# Patient Record
Sex: Male | Born: 1940 | Race: Black or African American | Hispanic: No | Marital: Married | Smoking: Former smoker
Health system: Southern US, Community
[De-identification: ages and names within clinical notes are randomized; demographics above are authoritative.]

## PROBLEM LIST (undated history)

## (undated) DIAGNOSIS — E119 Type 2 diabetes mellitus without complications: Secondary | ICD-10-CM

## (undated) DIAGNOSIS — C20 Malignant neoplasm of rectum: Secondary | ICD-10-CM

## (undated) DIAGNOSIS — I639 Cerebral infarction, unspecified: Secondary | ICD-10-CM

## (undated) DIAGNOSIS — I499 Cardiac arrhythmia, unspecified: Secondary | ICD-10-CM

## (undated) DIAGNOSIS — N189 Chronic kidney disease, unspecified: Secondary | ICD-10-CM

## (undated) DIAGNOSIS — C801 Malignant (primary) neoplasm, unspecified: Secondary | ICD-10-CM

## (undated) DIAGNOSIS — I10 Essential (primary) hypertension: Secondary | ICD-10-CM

## (undated) DIAGNOSIS — H409 Unspecified glaucoma: Secondary | ICD-10-CM

## (undated) DIAGNOSIS — Z978 Presence of other specified devices: Secondary | ICD-10-CM

## (undated) HISTORY — DX: Unspecified glaucoma: H40.9

## (undated) HISTORY — DX: Chronic kidney disease, unspecified: N18.9

## (undated) HISTORY — PX: COLON SURGERY: SHX602

---

## 2001-06-13 ENCOUNTER — Emergency Department (HOSPITAL_COMMUNITY): Admission: EM | Admit: 2001-06-13 | Discharge: 2001-06-13 | Payer: Self-pay | Admitting: Emergency Medicine

## 2004-06-22 ENCOUNTER — Inpatient Hospital Stay (HOSPITAL_COMMUNITY): Admission: EM | Admit: 2004-06-22 | Discharge: 2004-06-24 | Payer: Self-pay

## 2004-06-22 ENCOUNTER — Encounter (INDEPENDENT_AMBULATORY_CARE_PROVIDER_SITE_OTHER): Payer: Self-pay | Admitting: *Deleted

## 2004-06-23 ENCOUNTER — Ambulatory Visit: Payer: Self-pay | Admitting: Physical Medicine & Rehabilitation

## 2008-01-22 ENCOUNTER — Inpatient Hospital Stay (HOSPITAL_COMMUNITY): Admission: EM | Admit: 2008-01-22 | Discharge: 2008-01-24 | Payer: Self-pay | Admitting: Emergency Medicine

## 2010-10-24 NOTE — Consult Note (Signed)
NAMEDARRALL, MATA NO.:  [id number]   MEDICAL RECORD NO.:  [id number]          PATIENT TYPE:  INP   LOCATION:  3035                         FACILITY:  [facility]   PHYSICIAN:  Joyice Faster. Cornett, M.D.DATE OF BIRTH:  06-05-1941   DATE OF CONSULTATION:  DATE OF DISCHARGE:                                 CONSULTATION   PHYSICIAN REQUESTING CONSULTATION:  Dr. Ripley Fraise, MD.   REASON FOR CONSULTATION:  Abdominal pain, nausea, and vomiting.   HISTORY OF PRESENT ILLNESS:  The patient is a 70 year old male with past  medical history of colon cancer status post colon resection with  apparent colostomy and end-colostomy closure done all at the [place] [facility] in  [place].  He presents with 1-day history of increasing abdominal  distension, nausea, vomiting, and diffuse abdominal pain.  The pain is  10/10.  It is made worse by palpation.  He has had 2 episodes of  vomiting today already.  The pain has been progressing and is located  throughout his abdomen and apparently it is constant.  He was seen in  the emergency room and acute abdominal series revealed potential partial  small bowel obstruction.  He has had no blood in his emesis.  His last  bowel movement was earlier today and had 2 bowel movements today which  are normal.  I was asked to see the patient at the request of Dr.  Christy Gentles in consultation.   PAST MEDICAL HISTORY:  1. Type 2 diabetes mellitus.  2. History of cerebrovascular accident.  3. Hypertension.  4. High cholesterol.  5. Colon cancer.   PAST SURGICAL HISTORY:  Please see above.  Colon cancer.   MEDICATIONS:  Glipizide and Lantus.   ALLERGIES:  None.   SOCIAL HISTORY:  He denies tobacco or alcohol use.   FAMILY HISTORY:  Noncontributory.   REVIEW OF SYSTEMS:  As above, otherwise negative x15.   PHYSICAL EXAM:  VITAL SIGNS:  Temperature 97, heart rate 71, and blood  pressure 170/85.  GENERAL APPEARANCE:  A pleasant male, in no apparent distress.  HEENT:  The patient has poor dentition.  Mucous membranes are moist.  Oropharynx clear.  No jaundice.  NECK:  Supple, nontender, and full range of motion.  CHEST:  Lungs sounds were fair bilaterally.  Chest wall motion normal.  CARDIOVASCULAR:  Regular rate and rhythm without rub, murmur, or gallop.  EXTREMITIES:  Warm and well perfused.  Muscle strength appeared normal.  Range of motion normal.  ABDOMEN:  Distended and tender.  Multiple scars from previous surgery  noted.  No obvious rebound though.  No hernia.  Decreased bowel sounds.  NEURO:  Glasgow Coma Scale 15.  Motor and sensory function appeared  grossly intact.  His speech is clear.   DIAGNOSTIC STUDIES:  Acute abdominal series reveals potential partial  small bowel obstruction.  CT scan of the abdomen and pelvis with just  oral contrast reveals small bowel dilatation with some decompressed  distal small bowel, worrisome for partial small bowel obstruction.  There was a large amount of stool in the colon.  No  obvious significant  free fluid.  The patient has a white count of 11,800 with left shift.  Sodium 40, creatinine is 1.6, BUN 22, chloride 107, and potassium 4.9.   IMPRESSION:  1. Partial small bowel obstruction with history of colon cancer status      post multiple bowel procedures.  2. Type 2 diabetes mellitus.  3. Hypertension.  4. Hypercholesterolemia.  5. History of cerebrovascular accident.   PLAN:  We will admit for IV fluid and NG-tube decompression n.p.o.  We  will repeat his films in the morning with him on bowel rest.      Marcello Moores A. Cornett, M.D.  Electronically Signed     TAC/MEDQ  D:  01/22/2008  T:  01/23/2008  Job:  [id number]

## 2010-10-27 NOTE — Discharge Summary (Signed)
[name], [name] NO.:  [id number]   MEDICAL RECORD NO.:  [id number]          PATIENT TYPE:  INP   LOCATION:  3023                         FACILITY:  [facility]   PHYSICIAN:  Princess Bruins. Hickling, M.D.DATE OF BIRTH:  January 20, 1941   DATE OF ADMISSION:  06/22/2004  DATE OF DISCHARGE:  06/24/2004                                 DISCHARGE SUMMARY   FINAL DIAGNOSES:  1.  Left brain lacunar infarction, thrombotic, non-hemorrhagic type 434.01.  2.  Hypertension, in poor control 404.10 without congestive heart failure or      kidney disease.  3.  Diabetes, type 2, non-insulin dependent, in poor control.  4.  Dyslipidemia.   IMAGING PROCEDURES:  Magnetic resonance imaging of brain, magnetic resonance  angiography, intracranial carotid Doppler.   COMPLICATIONS:  None.   HOSPITAL COURSE:  The patient is a 71 year old gentleman who was admitted  with the sudden onset of right hemiparesis that occurred as he awakened in  the middle of the night.   The patient has improved in the hospital.  He has multiple risk factors for  stroke, including diabetes and hypertension that are out of control and  dyslipidemia.  The patient has been on medicines in the past.  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   On evaluation in the hospital, MRI shows an acute left basal ganglion non-  hemorrhagic deep white matter infarction.  Carotid Doppler shows mild  intimal wall thickening in the common carotid artery without evidence of  significant plaque or hemodynamically significant stenosis.  Vertebral  artery flow is antegrade.  A 2-D echocardiogram shows an ejection fraction  of 55% to 65%, no left ventricular regional wall abnormalities.  The left  ventricular wall thickness, systolic function and size were all normal.  Aortic valvular thickness was mildly increased.  No clots were seen.   Laboratory studies showed a hemoglobin A1c of 14, serum  homocysteine 13.09,  which is upper limits of normal, total cholesterol 231, HDL cholesterol 30,  LDL cholesterol 157, VLDL cholesterol 44, these are all abnormal.  Other  laboratory studies show an MRA that shows no significant proximal stenosis  and widely patent basilar arteries.  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  On the left, the patient has mild  atherosclerotic changes in the left cavernous and supraclinoid regions.  The  left middle cerebral artery and anterior cerebral artery are widely patent  and there is no significant posterior cerebral artery disease.   Other laboratory studies show a white count of 7900, hemoglobin 14.8,  hematocrit 42.8, MCV 84.5, platelet count 274,000, 50% polys, 33% lymphs,  11% monos, 5% eosinophils, 1% basophils.  Prothrombin time 12.5, PTT 26.  Sodium 139, potassium 3.8, chloride 103, CO2 26, glucose 325, BUN 9.0,  creatinine 1.2.  Calcium 9.2, total protein 6.9, albumin 3.6.  AST 14, ALT  15,  ALP 100, total bilirubin 0.6.  Urinalysis:  Specific gravity 1.028, pH  6.5, glucose greater than 1000 mg/dL, negative hemoglobin, bilirubin,  ketones 15 mg/dL, negative protein, negative nitrite, moderate leukocyte  esterase, 3-6 white blood cells, no red blood cells, few yeast.  Drug screen  for drugs of abuse was negative.   CONDITION ON DISCHARGE:  The patient is discharged in improved condition.  He has mild right-sided clumsiness but no true weakness.  His gait is  slightly broad-based today.  His major concern is his ability to afford the  medications, which was the problem in the past.  We are asking patient and  family services to provide some medications through the [facility] [facility],  and I have asked him to contact the [facility]  for  further medications.   DISCHARGE MEDICATIONS:  1.  Glucotrol 5 mg once daily.  This may need to be increased to 10 mg.  2.  Altace 5 mg daily.  3.  Zocor 40 mg once daily.  4.  Aspirin 325 mg daily.   DIET:  Low carbohydrate modified low salt diet.   FOLLOWUP:  He should return to see Dr. Leonie Man in 2-3 months at [phone].  He  needs ongoing care through diabetic care and we have asked advanced home  care to come to his home to assist him with proper diabetic monitoring.  Again, this is going to be difficult because the patient lacks insurance.      Will   WHH/MEDQ  D:  06/24/2004  T:  06/24/2004  Job:  [id number]

## 2011-03-09 LAB — GLUCOSE, CAPILLARY
Glucose-Capillary: 131 — ABNORMAL HIGH
Glucose-Capillary: 177 — ABNORMAL HIGH
Glucose-Capillary: 197 — ABNORMAL HIGH
Glucose-Capillary: 210 — ABNORMAL HIGH

## 2011-03-09 LAB — COMPREHENSIVE METABOLIC PANEL
ALT: 13
AST: 22
Albumin: 3.5
Alkaline Phosphatase: 64
CO2: 25
Chloride: 101
Creatinine, Ser: 1.4
GFR calc Af Amer: >60
GFR calc non Af Amer: 51 — ABNORMAL LOW
Potassium: 4.4
Sodium: 137
Total Bilirubin: 0.9

## 2011-03-09 LAB — URINALYSIS, ROUTINE W REFLEX MICROSCOPIC
Bilirubin Urine: NEGATIVE
Glucose, UA: NEGATIVE
Ketones, ur: NEGATIVE
Protein, ur: 100 — AB

## 2011-03-09 LAB — CBC
HCT: 41.8
Hemoglobin: 13.2
Hemoglobin: 13.7
MCHC: 32.8
MCHC: 33.4
MCV: 87.6
MCV: 87.8
Platelets: 200
RBC: 4.49
RBC: 4.59
RDW: 14.6
WBC: 12.4 — ABNORMAL HIGH

## 2011-03-09 LAB — DIFFERENTIAL
Basophils Absolute: 0
Basophils Relative: 0
Eosinophils Relative: 0
Monocytes Absolute: 0.5
Monocytes Relative: 4

## 2011-03-09 LAB — POCT I-STAT, CHEM 8
BUN: 22
Calcium, Ion: 1.11 — ABNORMAL LOW
Glucose, Bld: 133 — ABNORMAL HIGH
HCT: 43
TCO2: 26

## 2011-03-09 LAB — BASIC METABOLIC PANEL
CO2: 27
Chloride: 108
Creatinine, Ser: 1.36
GFR calc Af Amer: >60
Glucose, Bld: 129 — ABNORMAL HIGH
Sodium: 143

## 2011-03-09 LAB — URINE MICROSCOPIC-ADD ON

## 2020-01-18 ENCOUNTER — Encounter (HOSPITAL_COMMUNITY): Payer: Self-pay

## 2020-01-18 ENCOUNTER — Emergency Department (HOSPITAL_COMMUNITY)
Admission: EM | Admit: 2020-01-18 | Discharge: 2020-01-19 | Disposition: A | Discharge status: Home / Self Care | Payer: Medicare Other | Attending: Emergency Medicine | Admitting: Emergency Medicine

## 2020-01-18 ENCOUNTER — Other Ambulatory Visit: Payer: Self-pay

## 2020-01-18 DIAGNOSIS — R2243 Localized swelling, mass and lump, lower limb, bilateral: Secondary | ICD-10-CM | POA: Diagnosis not present

## 2020-01-18 DIAGNOSIS — Z5321 Procedure and treatment not carried out due to patient leaving prior to being seen by health care provider: Secondary | ICD-10-CM | POA: Diagnosis not present

## 2020-01-18 HISTORY — DX: Type 2 diabetes mellitus without complications: E11.9

## 2020-01-18 HISTORY — DX: Malignant (primary) neoplasm, unspecified: C80.1

## 2020-01-18 HISTORY — DX: Cerebral infarction, unspecified: I63.9

## 2020-01-18 HISTORY — DX: Essential (primary) hypertension: I10

## 2020-01-18 NOTE — ED Triage Notes (Signed)
Patient c/o bilateral lower leg swelling x 2 weeks.

## 2020-01-19 LAB — CBG MONITORING, ED: Glucose-Capillary: 194 mg/dL — ABNORMAL HIGH (ref 70–99)

## 2020-01-20 ENCOUNTER — Other Ambulatory Visit: Payer: Self-pay

## 2020-01-20 ENCOUNTER — Ambulatory Visit (HOSPITAL_COMMUNITY)
Admission: EM | Admit: 2020-01-20 | Discharge: 2020-01-20 | Disposition: A | Discharge status: Home / Self Care | Payer: Medicare Other | Attending: Family Medicine | Admitting: Family Medicine

## 2020-01-20 ENCOUNTER — Encounter (HOSPITAL_COMMUNITY): Payer: Self-pay | Admitting: Emergency Medicine

## 2020-01-20 DIAGNOSIS — R531 Weakness: Secondary | ICD-10-CM | POA: Diagnosis not present

## 2020-01-20 LAB — COMPREHENSIVE METABOLIC PANEL
ALT: 10 U/L (ref 0–44)
AST: 10 U/L — ABNORMAL LOW (ref 15–41)
Albumin: 4.2 g/dL (ref 3.5–5.0)
Alkaline Phosphatase: 41 U/L (ref 38–126)
Anion gap: 22 — ABNORMAL HIGH (ref 5–15)
BUN: 154 mg/dL — ABNORMAL HIGH (ref 8–23)
CO2: 16 mmol/L — ABNORMAL LOW (ref 22–32)
Calcium: 9.6 mg/dL (ref 8.9–10.3)
Chloride: 107 mmol/L (ref 98–111)
Creatinine, Ser: 18.89 mg/dL — ABNORMAL HIGH (ref 0.61–1.24)
GFR calc Af Amer: 2 mL/min — ABNORMAL LOW (ref >60)
GFR calc non Af Amer: 2 mL/min — ABNORMAL LOW (ref >60)
Glucose, Bld: 134 mg/dL — ABNORMAL HIGH (ref 70–99)
Potassium: 5.6 mmol/L — ABNORMAL HIGH (ref 3.5–5.1)
Sodium: 145 mmol/L (ref 135–145)
Total Bilirubin: 1.2 mg/dL (ref 0.3–1.2)
Total Protein: 7.8 g/dL (ref 6.5–8.1)

## 2020-01-20 MED ORDER — MIRTAZAPINE 7.5 MG PO TABS: 7.5000 mg | ORAL_TABLET | Freq: Every day | ORAL | 1 refills | Status: DC

## 2020-01-20 NOTE — Discharge Instructions (Signed)
Follow-up with [facility]  Call for appointment sooner than scheduled appointment end of this month

## 2020-01-20 NOTE — ED Triage Notes (Addendum)
Patient presents to [facility] for assessment of 3-4 weeks of bilateral leg weakness, tightness, and decreased ability to ambulate.    Patient states he takes insulins and BP meds, but is unsure of what he takes or what doses.  No hx on file.

## 2020-01-20 NOTE — ED Provider Notes (Signed)
Paul Bond    CSN: [id number] Arrival date & time: 01/20/20  1813      History   Chief Complaint Chief Complaint  Patient presents with  . Weakness    HPI SAVIOR HIMEBAUGH is a 79 y.o. male.  Patient presents with weakness in his legs.  He is normally followed at the [facility] for diabetes and hypertension.  He tells me the weakness occurs after he has been taken several steps and his legs seem to just be weak.  He denies any shortness of breath or chest pain.  He does take insulin for his diabetes which has been fairly well controlled by his history.  He is not a very good historian.  He denies any edema in the lower extremities.  Also complains of weight loss and anorexia.  Denies any change with bowel or bladder habits or abdominal pains. HPI  Past Medical History:  Diagnosis Date  . Cancer ([place])   . Diabetes mellitus without complication ([place])   . Hypertension   . Stroke [facility])     There are no problems to display for this patient.   Past Surgical History:  Procedure Laterality Date  . COLON SURGERY         Home Medications    Prior to Admission medications   Not on File    Family History History reviewed. No pertinent family history.  Social History Social History   Tobacco Use  . Smoking status: Former Research scientist (life sciences)  . Smokeless tobacco: Never Used  Vaping Use  . Vaping Use: Never used  Substance Use Topics  . Alcohol use: Never  . Drug use: Never     Allergies   Patient has no known allergies.   Review of Systems Review of Systems  Constitutional: Positive for fatigue and unexpected weight change.  Respiratory: Negative for shortness of breath.   Cardiovascular: Negative for chest pain.  Neurological: Positive for weakness.     Physical Exam Triage Vital Signs ED Triage Vitals [01/20/20 1846]  Enc Vitals Group     BP (!) 158/69     Pulse Rate 61     Resp 16     Temp 98.4 F (36.9 C)     Temp Source Oral     SpO2 98 %      Weight      Height      Head Circumference      Peak Flow      Pain Score 0     Pain Loc      Pain Edu?      Excl. in [place]?    No data found.  Updated Vital Signs BP (!) 158/69 (BP Location: Right Arm)   Pulse 61   Temp 98.4 F (36.9 C) (Oral)   Resp 16   SpO2 98%   Visual Acuity Right Eye Distance:   Left Eye Distance:   Bilateral Distance:    Right Eye Near:   Left Eye Near:    Bilateral Near:     Physical Exam Vitals and nursing note reviewed.  HENT:     Head: Normocephalic.     Mouth/Throat:     Mouth: Mucous membranes are moist.  Eyes:     Pupils: Pupils are equal, round, and reactive to light.  Cardiovascular:     Rate and Rhythm: Normal rate and regular rhythm.     Comments: Cannot feel pulses in his feet but feet are warm.  There is trace edema.  Pulmonary:     Effort: Pulmonary effort is normal.     Breath sounds: Normal breath sounds.  Abdominal:     General: Abdomen is flat.     Palpations: Abdomen is soft.  Neurological:     General: No focal deficit present.     Mental Status: He is alert and oriented to person, place, and time.      UC Treatments / Results  Labs (all labs ordered are listed, but only abnormal results are displayed) Labs Reviewed - No data to display  EKG   Radiology No results found.  Procedures Procedures (including critical care time)  Medications Ordered in UC Medications - No data to display  Initial Impression / Assessment and Plan / UC Course  I have reviewed the triage vital signs and the nursing notes.  Pertinent labs & imaging results that were available during my care of the patient were reviewed by me and considered in my medical decision making (see chart for details).     Leg weakness.  By history this patient would appear to have claudication.  Do not think this requires urgent care but should be evaluated by vascular surgery at some point.  [facility]. Regarding  anorexia and weight loss potential causes are many.  He needs thorough evaluation by PCP. Final Clinical Impressions(s) / UC Diagnoses   Final diagnoses:  None   Discharge Instructions   None    ED Prescriptions    None     PDMP not reviewed this encounter.   Wardell Honour, MD 01/20/20 [place]

## 2020-01-21 ENCOUNTER — Telehealth (HOSPITAL_COMMUNITY): Payer: Self-pay | Admitting: Emergency Medicine

## 2020-01-21 NOTE — Telephone Encounter (Signed)
Attempted to call patient about blood work and need to go to hospital.  Patient did not answer.  Attempted to call patient's wife, did not answer.  Left voicemail asking for return call.  [facility] on scene at his home due to patient's wife needing transport, states neither are answering the door.  No other family listed in chart.

## 2020-02-02 ENCOUNTER — Encounter (HOSPITAL_COMMUNITY): Payer: Self-pay

## 2020-02-02 ENCOUNTER — Emergency Department (HOSPITAL_COMMUNITY): Payer: Medicare Other

## 2020-02-02 ENCOUNTER — Other Ambulatory Visit: Payer: Self-pay

## 2020-02-02 ENCOUNTER — Other Ambulatory Visit: Payer: Self-pay | Admitting: Physician Assistant

## 2020-02-02 ENCOUNTER — Inpatient Hospital Stay (HOSPITAL_COMMUNITY)
Admission: EM | Admit: 2020-02-02 | Discharge: 2020-02-21 | DRG: 659 | Disposition: A | Discharge status: Skilled Nursing Facility | Payer: Medicare Other | Attending: Internal Medicine | Admitting: Internal Medicine

## 2020-02-02 DIAGNOSIS — Q600 Renal agenesis, unilateral: Secondary | ICD-10-CM | POA: Diagnosis not present

## 2020-02-02 DIAGNOSIS — K2970 Gastritis, unspecified, without bleeding: Secondary | ICD-10-CM | POA: Diagnosis present

## 2020-02-02 DIAGNOSIS — D509 Iron deficiency anemia, unspecified: Secondary | ICD-10-CM | POA: Diagnosis not present

## 2020-02-02 DIAGNOSIS — E1165 Type 2 diabetes mellitus with hyperglycemia: Secondary | ICD-10-CM | POA: Diagnosis present

## 2020-02-02 DIAGNOSIS — K635 Polyp of colon: Secondary | ICD-10-CM | POA: Diagnosis not present

## 2020-02-02 DIAGNOSIS — R627 Adult failure to thrive: Secondary | ICD-10-CM | POA: Diagnosis present

## 2020-02-02 DIAGNOSIS — R319 Hematuria, unspecified: Secondary | ICD-10-CM | POA: Diagnosis not present

## 2020-02-02 DIAGNOSIS — D638 Anemia in other chronic diseases classified elsewhere: Secondary | ICD-10-CM | POA: Diagnosis present

## 2020-02-02 DIAGNOSIS — D649 Anemia, unspecified: Secondary | ICD-10-CM

## 2020-02-02 DIAGNOSIS — Z7401 Bed confinement status: Secondary | ICD-10-CM

## 2020-02-02 DIAGNOSIS — K633 Ulcer of intestine: Secondary | ICD-10-CM

## 2020-02-02 DIAGNOSIS — R509 Fever, unspecified: Secondary | ICD-10-CM

## 2020-02-02 DIAGNOSIS — E876 Hypokalemia: Secondary | ICD-10-CM | POA: Diagnosis not present

## 2020-02-02 DIAGNOSIS — N136 Pyonephrosis: Secondary | ICD-10-CM | POA: Diagnosis present

## 2020-02-02 DIAGNOSIS — Z87891 Personal history of nicotine dependence: Secondary | ICD-10-CM

## 2020-02-02 DIAGNOSIS — R6520 Severe sepsis without septic shock: Secondary | ICD-10-CM | POA: Diagnosis not present

## 2020-02-02 DIAGNOSIS — E861 Hypovolemia: Secondary | ICD-10-CM | POA: Diagnosis not present

## 2020-02-02 DIAGNOSIS — K449 Diaphragmatic hernia without obstruction or gangrene: Secondary | ICD-10-CM | POA: Diagnosis present

## 2020-02-02 DIAGNOSIS — Z20822 Contact with and (suspected) exposure to covid-19: Secondary | ICD-10-CM | POA: Diagnosis present

## 2020-02-02 DIAGNOSIS — E860 Dehydration: Secondary | ICD-10-CM | POA: Diagnosis present

## 2020-02-02 DIAGNOSIS — I248 Other forms of acute ischemic heart disease: Secondary | ICD-10-CM | POA: Diagnosis present

## 2020-02-02 DIAGNOSIS — D126 Benign neoplasm of colon, unspecified: Secondary | ICD-10-CM

## 2020-02-02 DIAGNOSIS — D62 Acute posthemorrhagic anemia: Secondary | ICD-10-CM | POA: Diagnosis not present

## 2020-02-02 DIAGNOSIS — E872 Acidosis, unspecified: Secondary | ICD-10-CM

## 2020-02-02 DIAGNOSIS — N289 Disorder of kidney and ureter, unspecified: Secondary | ICD-10-CM

## 2020-02-02 DIAGNOSIS — Z8673 Personal history of transient ischemic attack (TIA), and cerebral infarction without residual deficits: Secondary | ICD-10-CM

## 2020-02-02 DIAGNOSIS — Z79899 Other long term (current) drug therapy: Secondary | ICD-10-CM

## 2020-02-02 DIAGNOSIS — E870 Hyperosmolality and hypernatremia: Secondary | ICD-10-CM | POA: Diagnosis present

## 2020-02-02 DIAGNOSIS — Z85038 Personal history of other malignant neoplasm of large intestine: Secondary | ICD-10-CM

## 2020-02-02 DIAGNOSIS — E1122 Type 2 diabetes mellitus with diabetic chronic kidney disease: Secondary | ICD-10-CM | POA: Diagnosis present

## 2020-02-02 DIAGNOSIS — A419 Sepsis, unspecified organism: Secondary | ICD-10-CM | POA: Diagnosis not present

## 2020-02-02 DIAGNOSIS — I959 Hypotension, unspecified: Secondary | ICD-10-CM | POA: Diagnosis present

## 2020-02-02 DIAGNOSIS — Z905 Acquired absence of kidney: Secondary | ICD-10-CM

## 2020-02-02 DIAGNOSIS — A4159 Other Gram-negative sepsis: Secondary | ICD-10-CM | POA: Diagnosis not present

## 2020-02-02 DIAGNOSIS — E871 Hypo-osmolality and hyponatremia: Secondary | ICD-10-CM | POA: Diagnosis not present

## 2020-02-02 DIAGNOSIS — N179 Acute kidney failure, unspecified: Secondary | ICD-10-CM | POA: Diagnosis present

## 2020-02-02 DIAGNOSIS — N189 Chronic kidney disease, unspecified: Secondary | ICD-10-CM | POA: Diagnosis present

## 2020-02-02 DIAGNOSIS — K209 Esophagitis, unspecified without bleeding: Secondary | ICD-10-CM | POA: Diagnosis present

## 2020-02-02 DIAGNOSIS — R195 Other fecal abnormalities: Secondary | ICD-10-CM

## 2020-02-02 DIAGNOSIS — Z6823 Body mass index (BMI) 23.0-23.9, adult: Secondary | ICD-10-CM

## 2020-02-02 DIAGNOSIS — K529 Noninfective gastroenteritis and colitis, unspecified: Secondary | ICD-10-CM | POA: Diagnosis not present

## 2020-02-02 DIAGNOSIS — I480 Paroxysmal atrial fibrillation: Secondary | ICD-10-CM | POA: Diagnosis not present

## 2020-02-02 DIAGNOSIS — N1330 Unspecified hydronephrosis: Secondary | ICD-10-CM

## 2020-02-02 DIAGNOSIS — Z980 Intestinal bypass and anastomosis status: Secondary | ICD-10-CM | POA: Diagnosis not present

## 2020-02-02 DIAGNOSIS — E43 Unspecified severe protein-calorie malnutrition: Secondary | ICD-10-CM | POA: Insufficient documentation

## 2020-02-02 DIAGNOSIS — R7881 Bacteremia: Secondary | ICD-10-CM | POA: Diagnosis not present

## 2020-02-02 DIAGNOSIS — R911 Solitary pulmonary nodule: Secondary | ICD-10-CM | POA: Diagnosis present

## 2020-02-02 DIAGNOSIS — R9431 Abnormal electrocardiogram [ECG] [EKG]: Secondary | ICD-10-CM | POA: Diagnosis not present

## 2020-02-02 DIAGNOSIS — N139 Obstructive and reflux uropathy, unspecified: Secondary | ICD-10-CM | POA: Diagnosis not present

## 2020-02-02 DIAGNOSIS — K626 Ulcer of anus and rectum: Secondary | ICD-10-CM | POA: Diagnosis present

## 2020-02-02 DIAGNOSIS — E875 Hyperkalemia: Secondary | ICD-10-CM | POA: Diagnosis present

## 2020-02-02 LAB — CBC WITH DIFFERENTIAL/PLATELET
Abs Immature Granulocytes: 0.04 10*3/uL (ref 0.00–0.07)
Basophils Absolute: 0 10*3/uL (ref 0.0–0.1)
Basophils Relative: 0 %
Eosinophils Absolute: 0 10*3/uL (ref 0.0–0.5)
Eosinophils Relative: 0 %
HCT: 33.8 % — ABNORMAL LOW (ref 39.0–52.0)
Hemoglobin: 10 g/dL — ABNORMAL LOW (ref 13.0–17.0)
Immature Granulocytes: 0 %
Lymphocytes Relative: 5 %
Lymphs Abs: 0.5 10*3/uL — ABNORMAL LOW (ref 0.7–4.0)
MCH: 28.7 pg (ref 26.0–34.0)
MCHC: 29.6 g/dL — ABNORMAL LOW (ref 30.0–36.0)
MCV: 97.1 fL (ref 80.0–100.0)
Monocytes Absolute: 0.6 10*3/uL (ref 0.1–1.0)
Monocytes Relative: 6 %
Neutro Abs: 9 10*3/uL — ABNORMAL HIGH (ref 1.7–7.7)
Neutrophils Relative %: 89 %
Platelets: 143 10*3/uL — ABNORMAL LOW (ref 150–400)
RBC: 3.48 MIL/uL — ABNORMAL LOW (ref 4.22–5.81)
RDW: 16.6 % — ABNORMAL HIGH (ref 11.5–15.5)
WBC: 10.2 10*3/uL (ref 4.0–10.5)
nRBC: 0 % (ref 0.0–0.2)

## 2020-02-02 LAB — RETICULOCYTES
Immature Retic Fract: 5.4 % (ref 2.3–15.9)
RBC.: 3.5 MIL/uL — ABNORMAL LOW (ref 4.22–5.81)
Retic Count, Absolute: 51.1 10*3/uL (ref 19.0–186.0)
Retic Ct Pct: 1.5 % (ref 0.4–3.1)

## 2020-02-02 LAB — MAGNESIUM: Magnesium: 4 mg/dL — ABNORMAL HIGH (ref 1.7–2.4)

## 2020-02-02 LAB — URINALYSIS, ROUTINE W REFLEX MICROSCOPIC
Bacteria, UA: NONE SEEN
Bilirubin Urine: NEGATIVE
Glucose, UA: 50 mg/dL — AB
Ketones, ur: 5 mg/dL — AB
Nitrite: NEGATIVE
Protein, ur: 30 mg/dL — AB
Specific Gravity, Urine: 1.013 (ref 1.005–1.030)
pH: 5 (ref 5.0–8.0)

## 2020-02-02 LAB — COMPREHENSIVE METABOLIC PANEL
ALT: 7 U/L (ref 0–44)
AST: 11 U/L — ABNORMAL LOW (ref 15–41)
Albumin: 3.5 g/dL (ref 3.5–5.0)
Alkaline Phosphatase: 37 U/L — ABNORMAL LOW (ref 38–126)
Anion gap: 26 — ABNORMAL HIGH (ref 5–15)
BUN: 245 mg/dL — ABNORMAL HIGH (ref 8–23)
CO2: 11 mmol/L — ABNORMAL LOW (ref 22–32)
Calcium: 9.2 mg/dL (ref 8.9–10.3)
Chloride: 119 mmol/L — ABNORMAL HIGH (ref 98–111)
Creatinine, Ser: 25.45 mg/dL — ABNORMAL HIGH (ref 0.61–1.24)
GFR calc Af Amer: 2 mL/min — ABNORMAL LOW (ref >60)
GFR calc non Af Amer: 1 mL/min — ABNORMAL LOW (ref >60)
Glucose, Bld: 173 mg/dL — ABNORMAL HIGH (ref 70–99)
Potassium: >7.5 mmol/L (ref 3.5–5.1)
Sodium: 156 mmol/L — ABNORMAL HIGH (ref 135–145)
Total Bilirubin: 1.2 mg/dL (ref 0.3–1.2)
Total Protein: 7.1 g/dL (ref 6.5–8.1)

## 2020-02-02 LAB — CBG MONITORING, ED: Glucose-Capillary: 92 mg/dL (ref 70–99)

## 2020-02-02 LAB — SODIUM, URINE, RANDOM: Sodium, Ur: 46 mmol/L

## 2020-02-02 LAB — SARS CORONAVIRUS 2 BY RT PCR (HOSPITAL ORDER, PERFORMED IN ~~LOC~~ HOSPITAL LAB): SARS Coronavirus 2: NEGATIVE

## 2020-02-02 MED ORDER — SODIUM CHLORIDE 0.9 % IV SOLN
1000.0000 mL | INTRAVENOUS | Status: DC
  Administered 2020-02-02 (×2): 1000 mL via INTRAVENOUS

## 2020-02-02 MED ORDER — INSULIN ASPART 100 UNIT/ML ~~LOC~~ SOLN
10.0000 [IU] | Freq: Once | SUBCUTANEOUS | Status: AC
  Administered 2020-02-02: 10 [IU] via INTRAVENOUS

## 2020-02-02 MED ORDER — SODIUM POLYSTYRENE SULFONATE 15 GM/60ML PO SUSP
45.0000 g | Freq: Once | ORAL | Status: AC
  Administered 2020-02-02: 45 g via ORAL
  Filled 2020-02-02: qty 180

## 2020-02-02 MED ORDER — SODIUM ZIRCONIUM CYCLOSILICATE 10 G PO PACK
10.0000 g | PACK | Freq: Once | ORAL | Status: DC
  Filled 2020-02-02: qty 1

## 2020-02-02 MED ORDER — DEXTROSE 50 % IV SOLN
25.0000 g | Freq: Once | INTRAVENOUS | Status: AC
Start: 2020-02-02 — End: 2020-02-02
  Administered 2020-02-02: 25 g via INTRAVENOUS
  Filled 2020-02-02: qty 50

## 2020-02-02 MED ORDER — SODIUM BICARBONATE 8.4 % IV SOLN
INTRAVENOUS | Status: DC
  Filled 2020-02-02 (×5): qty 150

## 2020-02-02 MED ORDER — CALCIUM GLUCONATE-NACL 1-0.675 GM/50ML-% IV SOLN
1.0000 g | Freq: Once | INTRAVENOUS | Status: AC
Start: 2020-02-02 — End: 2020-02-02
  Administered 2020-02-02: 1000 mg via INTRAVENOUS
  Filled 2020-02-02: qty 50

## 2020-02-02 MED ORDER — HEPARIN SODIUM (PORCINE) 5000 UNIT/ML IJ SOLN: 5000.0000 [IU] | Freq: Three times a day (TID) | INTRAMUSCULAR | Status: DC

## 2020-02-02 MED ORDER — IOHEXOL 9 MG/ML PO SOLN
ORAL | Status: AC
  Administered 2020-02-02: 500 mL
  Filled 2020-02-02: qty 1000

## 2020-02-02 MED ORDER — SODIUM ZIRCONIUM CYCLOSILICATE 10 G PO PACK
10.0000 g | PACK | Freq: Three times a day (TID) | ORAL | Status: DC
  Administered 2020-02-02 – 2020-02-04 (×4): 10 g via ORAL
  Filled 2020-02-02 (×5): qty 1

## 2020-02-02 MED ORDER — SODIUM BICARBONATE-DEXTROSE 150-5 MEQ/L-% IV SOLN
150.0000 meq | INTRAVENOUS | Status: DC
  Filled 2020-02-02: qty 1000

## 2020-02-02 MED ORDER — INSULIN ASPART 100 UNIT/ML ~~LOC~~ SOLN
0.0000 [IU] | SUBCUTANEOUS | Status: DC
  Administered 2020-02-03 – 2020-02-04 (×3): 1 [IU] via SUBCUTANEOUS
  Administered 2020-02-04: 2 [IU] via SUBCUTANEOUS
  Administered 2020-02-04 – 2020-02-06 (×6): 1 [IU] via SUBCUTANEOUS
  Administered 2020-02-06: 3 [IU] via SUBCUTANEOUS
  Administered 2020-02-06 – 2020-02-07 (×3): 2 [IU] via SUBCUTANEOUS
  Administered 2020-02-07: 1 [IU] via SUBCUTANEOUS
  Administered 2020-02-07: 2 [IU] via SUBCUTANEOUS
  Administered 2020-02-07: 1 [IU] via SUBCUTANEOUS
  Administered 2020-02-08: 3 [IU] via SUBCUTANEOUS
  Administered 2020-02-08 (×2): 1 [IU] via SUBCUTANEOUS
  Administered 2020-02-08: 4 [IU] via SUBCUTANEOUS
  Administered 2020-02-09 (×2): 1 [IU] via SUBCUTANEOUS
  Administered 2020-02-09: 3 [IU] via SUBCUTANEOUS
  Administered 2020-02-09: 1 [IU] via SUBCUTANEOUS
  Administered 2020-02-09: 2 [IU] via SUBCUTANEOUS
  Administered 2020-02-09: 1 [IU] via SUBCUTANEOUS
  Administered 2020-02-09 – 2020-02-10 (×3): 3 [IU] via SUBCUTANEOUS
  Administered 2020-02-11: 4 [IU] via SUBCUTANEOUS
  Administered 2020-02-11: 1 [IU] via SUBCUTANEOUS
  Administered 2020-02-11: 3 [IU] via SUBCUTANEOUS
  Administered 2020-02-11: 4 [IU] via SUBCUTANEOUS
  Administered 2020-02-12: 1 [IU] via SUBCUTANEOUS
  Administered 2020-02-12: 4 [IU] via SUBCUTANEOUS
  Administered 2020-02-12 – 2020-02-15 (×10): 1 [IU] via SUBCUTANEOUS
  Administered 2020-02-15: 3 [IU] via SUBCUTANEOUS
  Administered 2020-02-15: 1 [IU] via SUBCUTANEOUS
  Administered 2020-02-16: 3 [IU] via SUBCUTANEOUS
  Administered 2020-02-16 (×3): 1 [IU] via SUBCUTANEOUS

## 2020-02-02 MED ORDER — ACETAMINOPHEN 325 MG PO TABS
650.0000 mg | ORAL_TABLET | Freq: Four times a day (QID) | ORAL | Status: DC | PRN
  Administered 2020-02-12 – 2020-02-17 (×2): 650 mg via ORAL
  Filled 2020-02-02 (×2): qty 2

## 2020-02-02 MED ORDER — ACETAMINOPHEN 650 MG RE SUPP
650.0000 mg | Freq: Four times a day (QID) | RECTAL | Status: DC | PRN
  Administered 2020-02-12: 650 mg via RECTAL
  Filled 2020-02-02: qty 1

## 2020-02-02 NOTE — ED Notes (Signed)
CBG 97 

## 2020-02-02 NOTE — Consult Note (Signed)
Referring Provider: No ref. provider found Primary Care Physician:  [facility], [facility] Primary Nephrologist:  Followed that [facility] hospital.  Unknown primary nephrologist  Reason for Consultation:  Acute versus chronic renal failure, correction of metabolic abnormalities, correction of acid base abnormalities, correction of electrolyte abnormalities, maintenance of  euvolemia  HPI: this is a 79 year old gentleman with a past medical history that includes hypertension diabetes , was evaluated at the urgent care center by Dr. Alain Honey for generalized weakness and weight loss 01/20/2020.there were very [place] labs in the epic system however he was found to have a sodium of 145 potassium 5.6 chloride 107 CO2 16 glucose 134 BUN 154 and a creatinine of 18.89.  His albumin was 4.2 AST 10 ALT 10 to a bilirubin of 1.2.Attempts were made to contact family and patient and there was no response to [facility].  He presented to the emergency room 02/02/2020 accompanied by his niece stated that he has had a sharp decline in function in the past 2 months and was found lying in bed incontinent of urine and too weak to stand. His labs in the emergency room showed a sodium 156 potassium greater than 7.5 chloride 119 CO2 11 glucose 173 BUN 245 creatinine 25 calcium 9.2 magnesium 4 albumin 3.5 AST 11 ALT 7 hemoglobin 10 WBC 10.2 platelets 143  The only medicine noted was Remeron 7.5 mg at bedtime.  There does not appear to be the use nonsteroidal anti-inflammatory drugs ACE inhibitors or ARB's.  Blood pressure 131/69 pulse 88 respiratory rate 23 temperature 97.5  Incontinence of urine status post 800 cc normal saline.  Urinalysis pending   Past Medical History:  Diagnosis Date  . Cancer ([place])   . Diabetes mellitus without complication ([place])   . Hypertension   . Stroke [facility])     Past Surgical History:  Procedure Laterality Date  . COLON SURGERY      Prior to Admission medications   Medication Sig Start  Date End Date Taking? Authorizing Provider  mirtazapine (REMERON) 7.5 MG tablet Take 1 tablet (7.5 mg total) by mouth at bedtime. 01/20/20   Wardell Honour, MD    Current Facility-Administered Medications  Medication Dose Route Frequency Provider Last Rate Last Admin  . 0.9 %  sodium chloride infusion  1,000 mL Intravenous Continuous Truddie Hidden, MD 125 mL/hr at 02/02/20 2022 1,000 mL at 02/02/20 2022  . calcium gluconate 1 g/ 50 mL sodium chloride IVPB  1 g Intravenous Once Truddie Hidden, MD 50 mL/hr at 02/02/20 2025 1,000 mg at 02/02/20 2025  . sodium bicarbonate 150 mEq in dextrose 5 % 1,000 mL infusion   Intravenous Continuous Benetta Spar D, RPH      . sodium polystyrene (KAYEXALATE) 15 GM/60ML suspension 45 g  45 g Oral Once Truddie Hidden, MD       Current Outpatient Medications  Medication Sig Dispense Refill  . mirtazapine (REMERON) 7.5 MG tablet Take 1 tablet (7.5 mg total) by mouth at bedtime. 15 tablet 1    Allergies as of 02/02/2020  . (No Known Allergies)    History reviewed. No pertinent family history.  Social History   Socioeconomic History  . Marital status: Married    Spouse name: Not on file  . Number of children: Not on file  . Years of education: Not on file  . Highest education level: Not on file  Occupational History  . Not on file  Tobacco Use  . Smoking status: Former Research scientist (life sciences)  .  Smokeless tobacco: Never Used  Vaping Use  . Vaping Use: Never used  Substance and Sexual Activity  . Alcohol use: Never  . Drug use: Never  . Sexual activity: Not on file  Other Topics Concern  . Not on file  Social History Narrative  . Not on file   Social Determinants of Health   Financial Resource Strain:   . Difficulty of Paying Living Expenses: Not on file  Food Insecurity:   . Worried About Charity fundraiser in the Last Year: Not on file  . Ran Out of Food in the Last Year: Not on file  Transportation Needs:   . Lack of Transportation  (Medical): Not on file  . Lack of Transportation (Non-Medical): Not on file  Physical Activity:   . Days of Exercise per Week: Not on file  . Minutes of Exercise per Session: Not on file  Stress:   . Feeling of Stress : Not on file  Social Connections:   . Frequency of Communication with Friends and Family: Not on file  . Frequency of Social Gatherings with Friends and Family: Not on file  . Attends Religious Services: Not on file  . Active Member of Clubs or Organizations: Not on file  . Attends Archivist Meetings: Not on file  . Marital Status: Not on file  Intimate Partner Violence:   . Fear of Current or Ex-Partner: Not on file  . Emotionally Abused: Not on file  . Physically Abused: Not on file  . Sexually Abused: Not on file    Review of Systems: Gen: weakness and generalized malaise with weight loss HEENT: No visual complaints, No history of Retinopathy. Normal external appearance No Epistaxis or Sore throat. No sinusitis.   CV: Denies chest pain, angina, palpitations, syncope, orthopnea, PND, peripheral edema, and claudication. Resp: Denies dyspnea at rest, dyspnea with exercise, cough, sputum, wheezing, coughing up blood, and pleurisy. GI: poor appetite no vomiting no diarrhea.  Unintentional weight loss GU : incontinent of urine no history or medications suggested of benign prostatic hypertrophy MS: Denies joint pain, limitation of movement, and swelling, stiffness, low back pain, extremity pain. Denies muscle weakness, cramps, atrophy.  No use of non steroidal antiinflammatory drugs. Derm: Denies rash, itching, dry skin, hives, moles, warts, or unhealing ulcers.  Psych: Denies depression, anxiety, memory loss, suicidal ideation, hallucinations, paranoia, and confusion. Heme: Denies bruising, bleeding, and enlarged lymph nodes. Neuro: No headache.  No diplopia. No dysarthria.  No dysphasia. Past medical history seems to indicate history of CVA in the  past. Endocrine diabetes mellitus documented does not appear to be on any medications.  No Thyroid disease.  No Adrenal disease.  Physical Exam: Vital signs in last 24 hours: Temp:  [97.5 F (36.4 C)] 97.5 F (36.4 C) (08/24 1702) Pulse Rate:  [88-103] 88 (08/24 2000) Resp:  [12-23] 23 (08/24 2000) BP: (121-139)/(68-77) 131/69 (08/24 2000) SpO2:  [97 %-100 %] 99 % (08/24 2000)   General:   Alert,  Well-developed, well-nourished, pleasant and cooperative in NAD Head:  Normocephalic and atraumatic. Eyes:  Sclera clear, no icterus.   Conjunctiva pink. Ears:  Normal auditory acuity. Nose:  No deformity, discharge,  or lesions. Mouth:  No deformity or lesions, dentition normal.dry mucous membranes Neck:  Supple; no masses or thyromegaly. JVP not elevated Lungs:  Clear throughout to auscultation.   No wheezes, crackles, or rhonchi. No acute distress. Heart:  Regular rate and rhythm; no murmurs, clicks, rubs,  or gallops. Abdomen:  Soft, nontender and nondistended. No masses, hepatosplenomegaly or hernias noted. Normal bowel sounds, without guarding, and without rebound.   Msk:  Symmetrical without gross deformities. Normal posture. Pulses:  No carotid, renal, femoral bruits. DP and PT symmetrical and equal Extremities:  Without clubbing or edema. Neurologic:  Alert and  oriented x4;  grossly normal neurologically. Skin:  Intact without significant lesions or rashes.   Intake/Output from previous day: No intake/output data recorded. Intake/Output this shift: No intake/output data recorded.  Lab Results: Recent Labs    02/02/20 1920  WBC 10.2  HGB 10.0*  HCT 33.8*  PLT 143*   BMET Recent Labs    02/02/20 1920  NA 156*  K >7.5*  CL 119*  CO2 11*  GLUCOSE 173*  BUN 245*  CREATININE 25.45*  CALCIUM 9.2   LFT Recent Labs    02/02/20 1920  PROT 7.1  ALBUMIN 3.5  AST 11*  ALT 7  ALKPHOS 37*  BILITOT 1.2   PT/INR No results for input(s): LABPROT, INR in the last  72 hours. Hepatitis Panel No results for input(s): HEPBSAG, HCVAB, HEPAIGM, HEPBIGM in the last 72 hours.  Studies/Results: No results found.  Assessment/Plan:  Acute kidney injury with a recently discovered creatinine of 18 mg/dL 01/20/2020.  Patient continued to decline with a creatinine is now increased to 25 mg/dL.  Etiology is unclear at this point however the differential would include bladder outlet obstruction.  Would recommend placement of Foley catheter.  Monitoring of input and output.  Daily creatinine.  Avoidance nephrotoxins.it is unclear if this does also represent chronic renal insufficiency will also check a renal ultrasound to evaluate cortical thinning and increased echogenicity.  We will send urine for urinalysis and microscopy and will proceed with urine electrolytes.  Patient appears to be dry on examination and is to benefit from IV fluid administration.  Hyperkalemia in the setting of hypernatremia metabolic acidosis with acute kidney injury.  Agree with administration of calcium, insulin, bicarbonate, Kayexalate.  We will also administer lokelma.I would like to try to medically manage his hyperkalemia at this point due to his clinical dehydration.  Metabolic acidosis.  IV bicarbonate will be administered 125 cc an hour D5W with 3 A of bicarb.  Hypernatremia impatient that is clinically dry.  At this point will continue with isotonic fluid correction and change to hypotonic fluid once adequately corrected.   LOS: 0 Sherril Croon @TODAY @8 :54 PM

## 2020-02-02 NOTE — ED Triage Notes (Addendum)
Pt BIB [facility] from home c/o failure to thrive. Pt's extended family came by to visit pt and found him lying in the bed covered in urine. Pt lives with his wife but the wife got sick 2 weeks ago. Since then pt has had very [place] to eat or drink and would not get out of bed. Pt denies any pain but states he doesn't feel good. Pt was initially hypotensive 60/100. Pt received 800 ml of NS en route Last BP with EMS was 152/90.

## 2020-02-02 NOTE — ED Notes (Signed)
Pt transported to ultrasound.

## 2020-02-02 NOTE — ED Provider Notes (Signed)
[facility] EMERGENCY DEPARTMENT Provider Note  CSN: [id number] Arrival date & time: 02/02/20 1657    History Chief Complaint  Patient presents with   Failure To Thrive    HPI  Paul Bond is a 79 y.o. male brought to the ED via EMS for evaluation of failure to thrive. He is accompanied by his niece who reports he has had a steep decline in his functioning at home over the last 2 months but today they found him in bed, soaked in urine and too weak to get up. He lives with his wife who has also been sick although family comes and checks on them frequently. He reports he has not been able to eat or drink much recently. He is primarily managed by the [place]. Niece indicates he had an appointment there a few days ago and is scheduled for followup later this week but he won't tell any family what the results of that visit were. He was seen in the [facility] about two weeks ago for leg weakness. CMP done that day showed markedly elevated Creatinine, but it doesn't appear the staff there were able to get in touch with the patient to relay those results. Patient denies any fever, chest pain, cough, SOB, N/V/D. States he has been urinating a lot. Denies hematuria or pyuria. EMS reported the patient was initially hypotensive but improved with IVF enroute.    Past Medical History:  Diagnosis Date   Cancer ([place])    Diabetes mellitus without complication ([place])    Hypertension    Stroke [facility])     Past Surgical History:  Procedure Laterality Date   COLON SURGERY      History reviewed. No pertinent family history.  Social History   Tobacco Use   Smoking status: Former Smoker   Smokeless tobacco: Never Used  Scientific laboratory technician Use: Never used  Substance Use Topics   Alcohol use: Never   Drug use: Never     Home Medications Prior to Admission medications   Medication Sig Start Date End Date Taking? Authorizing Provider  mirtazapine (REMERON) 7.5 MG tablet Take 1 tablet (7.5 mg  total) by mouth at bedtime. 01/20/20   Wardell Honour, MD     Allergies    Patient has no known allergies.   Review of Systems   Review of Systems A comprehensive review of systems was completed and negative except as noted in HPI.    Physical Exam BP 131/69    Pulse 88    Temp (!) 97.5 F (36.4 C) (Oral)    Resp (!) 23    SpO2 99%   Physical Exam Vitals and nursing note reviewed.  Constitutional:      Appearance: Normal appearance.  HENT:     Head: Normocephalic and atraumatic.     Nose: Nose normal.     Mouth/Throat:     Mouth: Mucous membranes are dry.  Eyes:     Extraocular Movements: Extraocular movements intact.     Conjunctiva/sclera: Conjunctivae normal.  Cardiovascular:     Rate and Rhythm: Normal rate.  Pulmonary:     Effort: Pulmonary effort is normal.     Breath sounds: Normal breath sounds.  Abdominal:     General: Abdomen is flat.     Palpations: Abdomen is soft.     Tenderness: There is no abdominal tenderness.  Musculoskeletal:        General: No swelling. Normal range of motion.  Cervical back: Neck supple.  Skin:    General: Skin is warm and dry.  Neurological:     General: No focal deficit present.     Mental Status: He is alert.     Cranial Nerves: No cranial nerve deficit.  Psychiatric:        Mood and Affect: Mood normal.      ED Results / Procedures / Treatments   Labs (all labs ordered are listed, but only abnormal results are displayed) Labs Reviewed  COMPREHENSIVE METABOLIC PANEL - Abnormal; Notable for the following components:      Result Value   Sodium 156 (*)    Potassium >7.5 (*)    Chloride 119 (*)    CO2 11 (*)    Glucose, Bld 173 (*)    BUN 245 (*)    Creatinine, Ser 25.45 (*)    AST 11 (*)    Alkaline Phosphatase 37 (*)    GFR calc non Af Amer 1 (*)    GFR calc Af Amer 2 (*)    Anion gap 26 (*)    All other components within normal limits  CBC WITH DIFFERENTIAL/PLATELET - Abnormal; Notable for the  following components:   RBC 3.48 (*)    Hemoglobin 10.0 (*)    HCT 33.8 (*)    MCHC 29.6 (*)    RDW 16.6 (*)    Platelets 143 (*)    Neutro Abs 9.0 (*)    Lymphs Abs 0.5 (*)    All other components within normal limits  MAGNESIUM - Abnormal; Notable for the following components:   Magnesium 4.0 (*)    All other components within normal limits  URINE CULTURE  SARS CORONAVIRUS 2 BY RT PCR (HOSPITAL ORDER, [facility])  URINALYSIS, ROUTINE W REFLEX MICROSCOPIC    EKG EKG Interpretation  Date/Time:  Tuesday February 02 2020 17:40:23 EDT Ventricular Rate:  97 PR Interval:    QRS Duration: 94 QT Interval:  367 QTC Calculation: 467 R Axis:   -74 Text Interpretation: Sinus rhythm 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  Procedures .Critical Care Performed by: Truddie Hidden, MD Authorized by: Truddie Hidden, MD   Critical care provider statement:    Critical care time (minutes):  45   Critical care was necessary to treat or prevent imminent or life-threatening deterioration of the following conditions:  Renal failure   Critical care was time spent personally by me on the following activities:  Discussions with consultants, evaluation of patient's response to treatment, examination of patient, ordering and performing treatments and interventions, ordering and review of laboratory studies, ordering and review of radiographic studies, pulse oximetry, re-evaluation of patient's condition, obtaining history from patient or surrogate and review of old charts    Medications Ordered in the ED Medications  0.9 %  sodium chloride infusion (1,000 mLs Intravenous Bolus from Bag 02/02/20 2022)  calcium gluconate 1 g/ 50 mL sodium chloride IVPB (1,000 mg Intravenous New Bag/Given 02/02/20 2025)  sodium  polystyrene (KAYEXALATE) 15 GM/60ML suspension 45 g (has no administration in time range)  sodium bicarbonate 150 mEq in dextrose 5 % 1,000 mL infusion (has no administration in time range)  insulin aspart (novoLOG) injection 10 Units (10 Units Intravenous Given 02/02/20 2032)  dextrose 50 % solution 25 g (25 g Intravenous Given 02/02/20 2021)  MDM Rules/Calculators/A&P MDM Patient with failure to thrive, recent UC visit with Cr >18, lost to followup afterwards here but has been to the [place] for unknown reason. The patient would like to be full code and is amenable to dialysis if labs today confirm kidney failure.  ED Course  I have reviewed the triage vital signs and the nursing notes.  Pertinent labs & imaging results that were available during my care of the patient were reviewed by me and considered in my medical decision making (see chart for details).  Clinical Course as of Feb 01 2113  Tue Feb 02, 2020  1948 CBC shows mild anemia.    [CS]  1948 EKG does not show signs of hemodynamically significant hyperkalemia.    [CS]  2011 CMP reviewed, markedly elevated K with signs of acidosis. Creatinine is still pending, but given recent results from [facility], will discuss with Nephrology.    [CS]  2021 Spoke with Dr. Justin Mend, Nephrology who agrees with CaGluc, D50/Insulin. He prefers Kayexalate to [facility] for quicker onset of action. Requests Bicarb drip and admission to Hospitalists. He also recommends a foley catheter to rule out obstruction.    [CS]  2024 Patient continues to rest comfortably, no distress. Explained results and plan with patient who is in agreement.    [CS]  2112 Spoke with Dr. Marlowe Sax, Hospitalist, who will come evaluate the patient for admission.    [CS]    Clinical Course User Index [CS] Truddie Hidden, MD    Final Clinical Impression(s) / ED Diagnoses Final diagnoses:  Dehydration  AKI (acute kidney injury) ([place])  Hyperkalemia    Rx / DC Orders ED Discharge  Orders    None       Truddie Hidden, MD 02/02/20 2114

## 2020-02-02 NOTE — H&P (Signed)
History and Physical    Paul Bond YOF:[id number] DOB: 1940-11-18 DOA: 02/02/2020  [facility] Patient coming from: Home  Chief Complaint: Failure to thrive  HPI: Paul Bond is a 79 y.o. male with medical history significant of history of cancer, diabetes, hypertension, stroke presenting to the ED via EMS from home for evaluation of failure to thrive.  Patient was hypotensive with EMS and given 800 cc normal saline in route.  Patient was evaluated at urgent care on 8/11 for weakness.  Labs done at that time showing sodium 145, potassium 5.6, chloride 107, bicarb 16, BUN 154, creatinine 18.89, and glucose 134. Attempts were made to contact patient and family but there was no response.  History provided mostly by patient's niece at bedside.  She states patient is normally quite physically active and cuts his own grass/does yard work.  He normally takes care of his wife as well.  For the past 3 weeks he has not been eating and has been bedbound.  He has lost a significant amount of weight.  Patient states he is not able to eat because he has lost his appetite.  Denies dysphagia, nausea, vomiting, or abdominal pain.  Denies fevers, cough, shortness of breath, or chest pain.  He continues to make urine and denies dysuria.  ED Course: Afebrile.  Mildly tachycardic.  No leukocytosis.  Hemoglobin 10.0 and MCV 97, no recent labs for comparison.  Platelet count 143K.  Sodium 156, potassium >7.5, chloride 119, bicarb 11, BUN 245, creatinine 25.45, and glucose 173.  No elevation of LFTs.  Magnesium 4.0.  UA and urine culture pending.  SARS-CoV-2 PCR test pending.  Nephrology consulted.  Review of Systems:  All systems reviewed and apart from history of presenting illness, are negative.  Past Medical History:  Diagnosis Date  . Cancer ([place])   . Diabetes mellitus without complication ([place])   . Hypertension   . Stroke [facility])     Past Surgical History:  Procedure Laterality Date   . COLON SURGERY       reports that he has quit smoking. He has never used smokeless tobacco. He reports that he does not drink alcohol and does not use drugs.  No Known Allergies  History reviewed. No pertinent family history.  Prior to Admission medications   Medication Sig Start Date End Date Taking? Authorizing Provider  mirtazapine (REMERON) 7.5 MG tablet Take 1 tablet (7.5 mg total) by mouth at bedtime. 01/20/20   Wardell Honour, MD    Physical Exam: Vitals:   02/02/20 1800 02/02/20 1900 02/02/20 2000 02/02/20 2100  BP: 127/68 139/77 131/69 136/71  Pulse: (!) 101 94 88 (!) 102  Resp: 17 12 (!) 23 13  Temp:      TempSrc:      SpO2: 100% 98% 99% 99%    Physical Exam Constitutional:      General: He is not in acute distress.    Comments: Very lethargic  HENT:     Head: Normocephalic and atraumatic.     Mouth/Throat:     Mouth: Mucous membranes are dry.     Pharynx: Oropharynx is clear.  Eyes:     Extraocular Movements: Extraocular movements intact.     Conjunctiva/sclera: Conjunctivae normal.  Cardiovascular:     Rate and Rhythm: Normal rate and regular rhythm.     Pulses: Normal pulses.  Pulmonary:     Effort: Pulmonary effort is normal. No respiratory distress.     Breath sounds: Normal  breath sounds. No wheezing or rales.  Abdominal:     General: Bowel sounds are normal. There is no distension.     Palpations: Abdomen is soft.     Tenderness: There is no abdominal tenderness. There is no guarding.  Musculoskeletal:        General: No swelling or tenderness.     Cervical back: Normal range of motion and neck supple.  Skin:    General: Skin is warm and dry.  Neurological:     General: No focal deficit present.     Mental Status: He is alert and oriented to person, place, and time.     Labs on Admission: I have personally reviewed following labs and imaging studies  CBC: Recent Labs  Lab 02/02/20 1920  WBC 10.2  NEUTROABS 9.0*  HGB 10.0*  HCT  33.8*  MCV 97.1  PLT 098*   Basic Metabolic Panel: Recent Labs  Lab 02/02/20 1920  NA 156*  K >7.5*  CL 119*  CO2 11*  GLUCOSE 173*  BUN 245*  CREATININE 25.45*  CALCIUM 9.2  MG 4.0*   GFR: CrCl cannot be calculated (Unknown ideal weight.). Liver Function Tests: Recent Labs  Lab 02/02/20 1920  AST 11*  ALT 7  ALKPHOS 37*  BILITOT 1.2  PROT 7.1  ALBUMIN 3.5   No results for input(s): LIPASE, AMYLASE in the last 168 hours. No results for input(s): AMMONIA in the last 168 hours. Coagulation Profile: No results for input(s): INR, PROTIME in the last 168 hours. Cardiac Enzymes: No results for input(s): CKTOTAL, CKMB, CKMBINDEX, TROPONINI in the last 168 hours. BNP (last 3 results) No results for input(s): PROBNP in the last 8760 hours. HbA1C: No results for input(s): HGBA1C in the last 72 hours. CBG: No results for input(s): GLUCAP in the last 168 hours. Lipid Profile: No results for input(s): CHOL, HDL, LDLCALC, TRIG, CHOLHDL, LDLDIRECT in the last 72 hours. Thyroid Function Tests: No results for input(s): TSH, T4TOTAL, FREET4, T3FREE, THYROIDAB in the last 72 hours. Anemia Panel: No results for input(s): VITAMINB12, FOLATE, FERRITIN, TIBC, IRON, RETICCTPCT in the last 72 hours. Urine analysis:    Component Value Date/Time   COLORURINE YELLOW 02/02/2020 2205   APPEARANCEUR CLEAR 02/02/2020 2205   LABSPEC 1.013 02/02/2020 2205   PHURINE 5.0 02/02/2020 2205   GLUCOSEU 50 (A) 02/02/2020 2205   HGBUR MODERATE (A) 02/02/2020 2205   BILIRUBINUR NEGATIVE 02/02/2020 2205   KETONESUR 5 (A) 02/02/2020 2205   PROTEINUR 30 (A) 02/02/2020 2205   UROBILINOGEN 1.0 01/21/2008 2334   NITRITE NEGATIVE 02/02/2020 2205   LEUKOCYTESUR MODERATE (A) 02/02/2020 2205    Radiological Exams on Admission: US RENAL  Result Date: 02/02/2020 CLINICAL DATA:  Initial evaluation for acute renal failure. History of prior left nephrectomy. EXAM: RENAL / URINARY TRACT ULTRASOUND COMPLETE  COMPARISON:  None available. FINDINGS: Right Kidney: Renal measurements: 12.5 x 6.7 x 7.7 cm = volume: 336 mL. Increased echogenicity seen within the renal parenchyma. No shadowing echogenic calculi. Moderate hydronephrosis seen involving the right kidney. Dilatation of the visualized proximal right ureter seen as well. 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 No focal renal mass. Left Kidney: Absent.  No abnormality at the left nephrectomy bed. Bladder: Appears normal for degree of bladder distention. Other: None. IMPRESSION: 1. 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 2. Increased echogenicity within the underlying right renal parenchyma, consistent with medical renal disease. 3. Prior left nephrectomy. Electronically Signed   By:  Jeannine Boga M.D.   On: 02/02/2020 21:56    EKG: Independently reviewed.  Sinus rhythm, LAFB.  Anteroseptal infarct, old.  Assessment/Plan Principal Problem:   AKI (acute kidney injury) ([place]) Active Problems:   Hyperkalemia   Hypermagnesemia   Hypernatremia   Metabolic acidosis   AKI: Labs done during urgent care visit on 8/11 showing BUN 154, creatinine 18.89.  Labs done today showing worsening renal function BUN 245, creatinine 25.45.  Unclear whether patient has baseline renal insufficiency as there are no recent labs for comparison. Etiology is unclear at this point - prerenal from severe dehydration versus possibly post renal from bladder outlet obstruction. -Place Foley catheter.  Continue IV fluid hydration.  Monitor renal function and urine output closely.  Avoid nephrotoxic agents/contrast.  Renal ultrasound ordered.  UA ordered.  Check urine sodium, creatinine.  Serial labs and monitor very closely.  Nephrology following, appreciate recommendations.  Hyperkalemia: Likely due to AKI. Potassium was 5.6 on 8/11 and is >7.5 on labs done today.  EKG without acute  changes. -Cardiac monitoring.  Receiving medical treatment for hyperkalemia including calcium, insulin, Kayexalate, Lokelma, and bicarb.  Nephrology following.  Serial labs and monitor very closely.  Hypermagnesemia: Likely due to AKI.  Magnesium 4.0.  No bradycardia or acute EKG changes. -Cardiac monitoring, nephrology following.  Serial labs and monitor very closely.  High anion gap metabolic acidosis: Likely due to AKI.  Labs done on 8/11 showing bicarb 16 and anion gap 22.  Labs done today showing bicarb 11 and anion gap 26. -Bicarb dextrose infusion at 100 cc/h.  Serial labs and monitor very closely.  Hypernatremia: Likely due to severe dehydration.  Sodium was 145 on labs done 8/11 and now it is up to 157 (corrected for hyperglycemia). -Nephrology recommending continuing isotonic fluid correction (normal saline at 125 cc/h) and changing to hypotonic fluid once adequately corrected.  Check serum osmolarity.  Serial labs and monitor very closely.  Failure to thrive/anorexia/unintentional weight loss: Renal failure likely contributing. -Order CT chest/abdomen/pelvis to assess for possible infectious source/ underlying malignancy.  Order nutrition consult.  Normocytic anemia: Hemoglobin 10.0 and MCV 97, no recent labs available for comparison. -Anemia panel, FOBT  Diabetes mellitus: Unclear whether patient is on insulin, no records available at this time. -Check A1c.  Sliding scale insulin very sensitive every 4 hours and CBG checks.  Hypertension: Patient was hypotensive with EMS and blood pressure improved after IV fluid bolus. -Avoid antihypertensives at this time  DVT prophylaxis: Subcutaneous heparin Code Status: Full code-discussed with the patient. Family Communication: Niece at bedside. Disposition Plan: Status is: Inpatient  Remains inpatient appropriate because:Ongoing diagnostic testing needed not appropriate for outpatient work up, IV treatments appropriate due to intensity  of illness or inability to take PO and Inpatient level of care appropriate due to severity of illness   Dispo: The patient is from: Home              Anticipated d/c is to: SNF              Anticipated d/c date is: > 3 days              Patient currently is not medically stable to d/c.  The medical decision making on this patient was of high complexity and the patient is at high risk for clinical deterioration, therefore this is a level 3 visit.  Shela Leff MD [facility]  If 7PM-7AM, please contact night-coverage [email or web address]  02/02/2020, 10:54 PM

## 2020-02-02 NOTE — ED Notes (Signed)
Per admitting doctor stop NS @ 125 ml/hr due to pt's sodium being elevated. Admitting only wants sodium bicarb continuously at 100 ml/hr.

## 2020-02-03 ENCOUNTER — Inpatient Hospital Stay (HOSPITAL_COMMUNITY): Payer: Medicare Other | Admitting: Anesthesiology

## 2020-02-03 ENCOUNTER — Inpatient Hospital Stay (HOSPITAL_COMMUNITY): Payer: Medicare Other

## 2020-02-03 ENCOUNTER — Other Ambulatory Visit (HOSPITAL_COMMUNITY): Payer: Medicare Other

## 2020-02-03 ENCOUNTER — Encounter (HOSPITAL_COMMUNITY)
Admission: EM | Disposition: A | Discharge status: Skilled Nursing Facility | Payer: Self-pay | Source: Home / Self Care | Attending: Internal Medicine

## 2020-02-03 DIAGNOSIS — R9431 Abnormal electrocardiogram [ECG] [EKG]: Secondary | ICD-10-CM

## 2020-02-03 HISTORY — PX: CYSTOSCOPY W/ URETERAL STENT PLACEMENT: SHX1429

## 2020-02-03 LAB — POCT I-STAT EG7
Acid-base deficit: 2 mmol/L (ref 0.0–2.0)
Bicarbonate: 23.2 mmol/L (ref 20.0–28.0)
Calcium, Ion: 1.02 mmol/L — ABNORMAL LOW (ref 1.15–1.40)
HCT: 31 % — ABNORMAL LOW (ref 39.0–52.0)
Hemoglobin: 10.5 g/dL — ABNORMAL LOW (ref 13.0–17.0)
O2 Saturation: 68 %
Potassium: 5.4 mmol/L — ABNORMAL HIGH (ref 3.5–5.1)
Sodium: 159 mmol/L — ABNORMAL HIGH (ref 135–145)
TCO2: 24 mmol/L (ref 22–32)
pCO2, Ven: 40.6 mmHg — ABNORMAL LOW (ref 44.0–60.0)
pH, Ven: 7.365 (ref 7.250–7.430)
pO2, Ven: 36 mmHg (ref 32.0–45.0)

## 2020-02-03 LAB — RENAL FUNCTION PANEL
Albumin: 3.5 g/dL (ref 3.5–5.0)
Albumin: 3.3 g/dL — ABNORMAL LOW (ref 3.5–5.0)
Anion gap: 26 — ABNORMAL HIGH (ref 5–15)
Anion gap: 29 — ABNORMAL HIGH (ref 5–15)
BUN: 249 mg/dL — ABNORMAL HIGH (ref 8–23)
BUN: 246 mg/dL — ABNORMAL HIGH (ref 8–23)
CO2: 12 mmol/L — ABNORMAL LOW (ref 22–32)
CO2: 9 mmol/L — ABNORMAL LOW (ref 22–32)
Calcium: 9.8 mg/dL (ref 8.9–10.3)
Calcium: 9.6 mg/dL (ref 8.9–10.3)
Chloride: 120 mmol/L — ABNORMAL HIGH (ref 98–111)
Chloride: 121 mmol/L — ABNORMAL HIGH (ref 98–111)
Creatinine, Ser: 26.09 mg/dL — ABNORMAL HIGH (ref 0.61–1.24)
Creatinine, Ser: 24.28 mg/dL — ABNORMAL HIGH (ref 0.61–1.24)
GFR calc Af Amer: 2 mL/min — ABNORMAL LOW (ref >60)
GFR calc Af Amer: 2 mL/min — ABNORMAL LOW (ref >60)
GFR calc non Af Amer: 1 mL/min — ABNORMAL LOW (ref >60)
GFR calc non Af Amer: 2 mL/min — ABNORMAL LOW (ref >60)
Glucose, Bld: 119 mg/dL — ABNORMAL HIGH (ref 70–99)
Glucose, Bld: 194 mg/dL — ABNORMAL HIGH (ref 70–99)
Phosphorus: 11.1 mg/dL — ABNORMAL HIGH (ref 2.5–4.6)
Phosphorus: 10.3 mg/dL — ABNORMAL HIGH (ref 2.5–4.6)
Potassium: 6.7 mmol/L (ref 3.5–5.1)
Potassium: 5.7 mmol/L — ABNORMAL HIGH (ref 3.5–5.1)
Sodium: 158 mmol/L — ABNORMAL HIGH (ref 135–145)
Sodium: 159 mmol/L — ABNORMAL HIGH (ref 135–145)

## 2020-02-03 LAB — VITAMIN B12: Vitamin B-12: 354 pg/mL (ref 180–914)

## 2020-02-03 LAB — FOLATE: Folate: 10.9 ng/mL (ref >5.9)

## 2020-02-03 LAB — CBC
HCT: 35.7 % — ABNORMAL LOW (ref 39.0–52.0)
Hemoglobin: 11 g/dL — ABNORMAL LOW (ref 13.0–17.0)
MCH: 29.3 pg (ref 26.0–34.0)
MCHC: 30.8 g/dL (ref 30.0–36.0)
MCV: 94.9 fL (ref 80.0–100.0)
Platelets: 149 10*3/uL — ABNORMAL LOW (ref 150–400)
RBC: 3.76 MIL/uL — ABNORMAL LOW (ref 4.22–5.81)
RDW: 16.5 % — ABNORMAL HIGH (ref 11.5–15.5)
WBC: 12.7 10*3/uL — ABNORMAL HIGH (ref 4.0–10.5)
nRBC: 0.2 % (ref 0.0–0.2)

## 2020-02-03 LAB — IRON AND TIBC
Iron: 135 ug/dL (ref 45–182)
Saturation Ratios: 88 % — ABNORMAL HIGH (ref 17.9–39.5)
TIBC: 153 ug/dL — ABNORMAL LOW (ref 250–450)
UIBC: 18 ug/dL

## 2020-02-03 LAB — CBG MONITORING, ED
Glucose-Capillary: 139 mg/dL — ABNORMAL HIGH (ref 70–99)
Glucose-Capillary: 141 mg/dL — ABNORMAL HIGH (ref 70–99)
Glucose-Capillary: 158 mg/dL — ABNORMAL HIGH (ref 70–99)
Glucose-Capillary: 159 mg/dL — ABNORMAL HIGH (ref 70–99)
Glucose-Capillary: 173 mg/dL — ABNORMAL HIGH (ref 70–99)
Glucose-Capillary: 211 mg/dL — ABNORMAL HIGH (ref 70–99)
Glucose-Capillary: 99 mg/dL (ref 70–99)

## 2020-02-03 LAB — ECHOCARDIOGRAM LIMITED
Height: 67 in
Weight: 2879.98 oz

## 2020-02-03 LAB — HEMOGLOBIN A1C
Hgb A1c MFr Bld: 7.9 % — ABNORMAL HIGH (ref 4.8–5.6)
Mean Plasma Glucose: 180.03 mg/dL

## 2020-02-03 LAB — POC OCCULT BLOOD, ED: Fecal Occult Bld: POSITIVE — AB

## 2020-02-03 LAB — FERRITIN: Ferritin: 606 ng/mL — ABNORMAL HIGH (ref 24–336)

## 2020-02-03 LAB — CK: Total CK: 151 U/L (ref 49–397)

## 2020-02-03 LAB — GLUCOSE, CAPILLARY: Glucose-Capillary: 144 mg/dL — ABNORMAL HIGH (ref 70–99)

## 2020-02-03 LAB — TROPONIN I (HIGH SENSITIVITY)
Troponin I (High Sensitivity): 119 ng/L (ref <18)
Troponin I (High Sensitivity): 128 ng/L (ref <18)

## 2020-02-03 LAB — CREATININE, URINE, RANDOM: Creatinine, Urine: 111.01 mg/dL

## 2020-02-03 LAB — OSMOLALITY: Osmolality: 420 mOsm/kg (ref 275–295)

## 2020-02-03 SURGERY — CYSTOSCOPY, WITH RETROGRADE PYELOGRAM AND URETERAL STENT INSERTION
Anesthesia: General | Site: Ureter | Laterality: Right

## 2020-02-03 MED ORDER — INSULIN ASPART 100 UNIT/ML ~~LOC~~ SOLN
10.0000 [IU] | Freq: Once | SUBCUTANEOUS | Status: AC
  Administered 2020-02-03: 10 [IU] via INTRAVENOUS

## 2020-02-03 MED ORDER — CEFAZOLIN SODIUM-DEXTROSE 1-4 GM/50ML-% IV SOLN: 1.0000 g | Freq: Once | INTRAVENOUS | Status: DC

## 2020-02-03 MED ORDER — ORAL CARE MOUTH RINSE: 15.0000 mL | Freq: Once | OROMUCOSAL | Status: DC

## 2020-02-03 MED ORDER — LIDOCAINE 2% (20 MG/ML) 5 ML SYRINGE
INTRAMUSCULAR | Status: DC | PRN
  Administered 2020-02-03: 80 mg via INTRAVENOUS

## 2020-02-03 MED ORDER — SUCCINYLCHOLINE CHLORIDE 200 MG/10ML IV SOSY
PREFILLED_SYRINGE | INTRAVENOUS | Status: DC | PRN
  Administered 2020-02-03: 120 mg via INTRAVENOUS

## 2020-02-03 MED ORDER — ONDANSETRON HCL 4 MG/2ML IJ SOLN
INTRAMUSCULAR | Status: DC | PRN
  Administered 2020-02-03: 4 mg via INTRAVENOUS

## 2020-02-03 MED ORDER — WATER FOR IRRIGATION, STERILE IR SOLN
Status: DC | PRN
  Administered 2020-02-03: 3000 mL via INTRAVESICAL

## 2020-02-03 MED ORDER — DILTIAZEM HCL 30 MG PO TABS: 30.0000 mg | ORAL_TABLET | Freq: Four times a day (QID) | ORAL | Status: DC

## 2020-02-03 MED ORDER — PHENYLEPHRINE HCL-NACL 10-0.9 MG/250ML-% IV SOLN
INTRAVENOUS | Status: DC | PRN
  Administered 2020-02-03: 50 ug/min via INTRAVENOUS

## 2020-02-03 MED ORDER — PHENYLEPHRINE HCL (PRESSORS) 10 MG/ML IV SOLN
INTRAVENOUS | Status: DC | PRN
  Administered 2020-02-03 (×2): 80 ug via INTRAVENOUS

## 2020-02-03 MED ORDER — DILTIAZEM HCL 60 MG PO TABS
30.0000 mg | ORAL_TABLET | Freq: Four times a day (QID) | ORAL | Status: DC
  Administered 2020-02-03 – 2020-02-12 (×35): 30 mg via ORAL
  Filled 2020-02-03 (×38): qty 1

## 2020-02-03 MED ORDER — DEXTROSE 50 % IV SOLN
1.0000 | Freq: Once | INTRAVENOUS | Status: AC
  Administered 2020-02-03: 50 mL via INTRAVENOUS
  Filled 2020-02-03: qty 50

## 2020-02-03 MED ORDER — FENTANYL CITRATE (PF) 100 MCG/2ML IJ SOLN: 25.0000 ug | INTRAMUSCULAR | Status: DC | PRN

## 2020-02-03 MED ORDER — VASOPRESSIN 20 UNIT/ML IV SOLN
INTRAVENOUS | Status: AC
  Filled 2020-02-03: qty 1

## 2020-02-03 MED ORDER — LACTATED RINGERS IV SOLN: INTRAVENOUS | Status: DC

## 2020-02-03 MED ORDER — IOHEXOL 300 MG/ML  SOLN
INTRAMUSCULAR | Status: DC | PRN
  Administered 2020-02-03: 20 mL via URETHRAL

## 2020-02-03 MED ORDER — DILTIAZEM HCL-DEXTROSE 125-5 MG/125ML-% IV SOLN (PREMIX)
5.0000 mg/h | INTRAVENOUS | Status: DC
  Administered 2020-02-03: 5 mg/h via INTRAVENOUS
  Filled 2020-02-03: qty 125

## 2020-02-03 MED ORDER — CHLORHEXIDINE GLUCONATE 0.12 % MT SOLN
15.0000 mL | Freq: Once | OROMUCOSAL | Status: DC
  Filled 2020-02-03: qty 15

## 2020-02-03 MED ORDER — PROMETHAZINE HCL 25 MG/ML IJ SOLN: 6.2500 mg | INTRAMUSCULAR | Status: DC | PRN

## 2020-02-03 MED ORDER — HEPARIN (PORCINE) 25000 UT/250ML-% IV SOLN
1200.0000 [IU]/h | INTRAVENOUS | Status: DC
  Administered 2020-02-03: 1200 [IU]/h via INTRAVENOUS
  Filled 2020-02-03: qty 250

## 2020-02-03 MED ORDER — MEPERIDINE HCL 25 MG/ML IJ SOLN: 6.2500 mg | INTRAMUSCULAR | Status: DC | PRN

## 2020-02-03 MED ORDER — CEFAZOLIN SODIUM-DEXTROSE 1-4 GM/50ML-% IV SOLN
INTRAVENOUS | Status: DC | PRN
  Administered 2020-02-03: 1 g via INTRAVENOUS

## 2020-02-03 MED ORDER — HEPARIN SODIUM (PORCINE) 5000 UNIT/ML IJ SOLN
5000.0000 [IU] | Freq: Three times a day (TID) | INTRAMUSCULAR | Status: DC
  Administered 2020-02-03 – 2020-02-13 (×30): 5000 [IU] via SUBCUTANEOUS
  Filled 2020-02-03 (×28): qty 1

## 2020-02-03 MED ORDER — FENTANYL CITRATE (PF) 250 MCG/5ML IJ SOLN
INTRAMUSCULAR | Status: AC
  Filled 2020-02-03: qty 5

## 2020-02-03 MED ORDER — SODIUM CHLORIDE 0.45 % IV SOLN: INTRAVENOUS | Status: DC

## 2020-02-03 MED ORDER — VASOPRESSIN 20 UNIT/ML IV SOLN
INTRAVENOUS | Status: DC | PRN
  Administered 2020-02-03: 2 [IU] via INTRAVENOUS

## 2020-02-03 MED ORDER — METOPROLOL TARTRATE 5 MG/5ML IV SOLN: 5.0000 mg | Freq: Once | INTRAVENOUS | Status: AC

## 2020-02-03 MED ORDER — DEXTROSE 50 % IV SOLN: 25.0000 mL | Freq: Once | INTRAVENOUS | Status: DC

## 2020-02-03 MED ORDER — CHLORHEXIDINE GLUCONATE CLOTH 2 % EX PADS
6.0000 | MEDICATED_PAD | Freq: Every day | CUTANEOUS | Status: DC
  Administered 2020-02-05 – 2020-02-20 (×13): 6 via TOPICAL

## 2020-02-03 MED ORDER — PROPOFOL 10 MG/ML IV BOLUS
INTRAVENOUS | Status: DC | PRN
  Administered 2020-02-03: 20 mg via INTRAVENOUS
  Administered 2020-02-03: 90 mg via INTRAVENOUS

## 2020-02-03 MED ORDER — METOPROLOL TARTRATE 5 MG/5ML IV SOLN
INTRAVENOUS | Status: AC
  Administered 2020-02-03: 5 mg via INTRAVENOUS
  Filled 2020-02-03: qty 5

## 2020-02-03 MED ORDER — SODIUM CHLORIDE 0.9 % IV SOLN: INTRAVENOUS | Status: DC | PRN

## 2020-02-03 SURGICAL SUPPLY — 24 items
BAG DRN RND TRDRP ANRFLXCHMBR (UROLOGICAL SUPPLIES) ×1
BAG URINE DRAIN 2000ML AR STRL (UROLOGICAL SUPPLIES) ×3 IMPLANT
BAG URO CATCHER STRL LF (MISCELLANEOUS) ×3 IMPLANT
CATH FOLEY 2WAY SLVR  5CC 16FR (CATHETERS)
CATH FOLEY 2WAY SLVR 5CC 16FR (CATHETERS) IMPLANT
CATH INTERMIT  6FR 70CM (CATHETERS) ×3 IMPLANT
GLOVE BIOGEL M STRL SZ7.5 (GLOVE) ×3 IMPLANT
GOWN STRL REUS W/ TWL LRG LVL3 (GOWN DISPOSABLE) ×1 IMPLANT
GOWN STRL REUS W/ TWL XL LVL3 (GOWN DISPOSABLE) ×1 IMPLANT
GOWN STRL REUS W/TWL LRG LVL3 (GOWN DISPOSABLE) ×3
GOWN STRL REUS W/TWL XL LVL3 (GOWN DISPOSABLE) ×3
GUIDEWIRE ANG ZIPWIRE 038X150 (WIRE) ×2 IMPLANT
GUIDEWIRE STR DUAL SENSOR (WIRE) ×3 IMPLANT
KIT TURNOVER KIT B (KITS) ×3 IMPLANT
MANIFOLD NEPTUNE II (INSTRUMENTS) ×2 IMPLANT
NS IRRIG 1000ML POUR BTL (IV SOLUTION) IMPLANT
PACK CYSTO (CUSTOM PROCEDURE TRAY) ×3 IMPLANT
STENT URET 6FRX24 CONTOUR (STENTS) ×2 IMPLANT
STENT URET 6FRX26 CONTOUR (STENTS) IMPLANT
SYPHON OMNI JUG (MISCELLANEOUS) ×3 IMPLANT
TOWEL GREEN STERILE FF (TOWEL DISPOSABLE) ×3 IMPLANT
TUBE CONNECTING 12'X1/4 (SUCTIONS) ×1
TUBE CONNECTING 12X1/4 (SUCTIONS) ×1 IMPLANT
WATER STERILE IRR 3000ML UROMA (IV SOLUTION) ×3 IMPLANT

## 2020-02-03 NOTE — Progress Notes (Signed)
[facility] NEPHROLOGY PROGRESS NOTE  Assessment/ Plan: Pt is a 79 y.o. yo male with history of hypertension, DM, stroke, presented with failure to thrive, found to have advanced renal failure with BUN 245 creatinine 26 and hyperkalemia.  Kidney ultrasound with solitary kidney and hydronephrosis.  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  CT abdomen pelvis with severely atrophic left kidney and right kidney has moderate hydroureteronephrosis.  I recommend urology consult, discussed with Dr. Zigmund Daniel. Agree with continuing IV fluid.  Serum creatinine level and potassium level is improving.  Hopefully creatinine level continue to improve especially after urology intervention to relieve obstruction.  If no improvement he is heading towards requiring dialysis.  No urgent indication for dialysis today.  I have discussed this with the patient. Strict ins and out, daily weight. Repeat lab today evening.  #Hyperkalemia due to AKI on losartan.  Potassium level improving with medical management.  Continue sodium bicarbonate, IV fluid.  #Hypernatremia, hypovolemic: Patient is currently receiving normal saline.  I will change to half NS.  Also on sodium bicarbonate.  Encourage oral intake of water.  #Anion gap metabolic acidosis: Continue sodium bicarbonate IV.  I increase the rate 225 cc/h.  #Hypertension: Blood pressure currently acceptable.  Continue to hold losartan and amlodipine.  Volume resuscitation as above.  #Failure to thrive/weight loss: Multiple etiology.  Uremia is contributing as well.  Subjective: Seen and examined in ER.  Patient is lying on bed comfortable.  She was alert awake and oriented.  Denies nausea vomiting chest pain or shortness of breath. Objective Vital signs in last 24 hours: Vitals:   02/03/20 0430 02/03/20 0528  02/03/20 0530 02/03/20 0638  BP: 132/76 122/72 121/75 131/84  Pulse: 92  87 87  Resp: 14  10 14   Temp:    97.9 F (36.6 C)  TempSrc:    Oral  SpO2: 97%  100% 100%  Weight:      Height:       Weight change:   Intake/Output Summary (Last 24 hours) at 02/03/2020 1322 Last data filed at 02/03/2020 0415 Gross per 24 hour  Intake 175 ml  Output 1825 ml  Net -1650 ml       Labs: Basic Metabolic Panel: Recent Labs  Lab 02/02/20 1920 02/02/20 2300 02/03/20 0215  NA 156* 158* 159*  K >7.5* 6.7* 5.7*  CL 119* 120* 121*  CO2 11* 12* 9*  GLUCOSE 173* 119* 194*  BUN 245* 249* 246*  CREATININE 25.45* 26.09* 24.28*  CALCIUM 9.2 9.8 9.6  PHOS  --  11.1* 10.3*   Liver Function Tests: Recent Labs  Lab 02/02/20 1920 02/02/20 2300 02/03/20 0215  AST 11*  --   --   ALT 7  --   --   ALKPHOS 37*  --   --   BILITOT 1.2  --   --   PROT 7.1  --   --   ALBUMIN 3.5 3.5 3.3*   No results for input(s): LIPASE, AMYLASE in the last 168 hours. No results for input(s): AMMONIA in the last 168 hours. CBC: Recent Labs  Lab 02/02/20 1920 02/03/20 0644  WBC 10.2 12.7*  NEUTROABS 9.0*  --   HGB 10.0* 11.0*  HCT 33.8* 35.7*  MCV 97.1 94.9  PLT 143* 149*   Cardiac Enzymes: Recent Labs  Lab 02/03/20 1130  CKTOTAL 151   CBG: Recent Labs  Lab 02/03/20 0023 02/03/20 0156 02/03/20 0422 02/03/20 0756 02/03/20 1154  GLUCAP 99 211* 139* 141* 173*    Iron Studies:  Recent Labs    02/02/20 2250  IRON 135  TIBC 153*  FERRITIN 606*   Studies/Results: CT ABDOMEN PELVIS WO CONTRAST  Result Date: 02/03/2020 CLINICAL DATA:  Unintentional weight loss EXAM: CT CHEST, ABDOMEN AND PELVIS WITHOUT CONTRAST TECHNIQUE: Multidetector CT imaging of the chest, abdomen and pelvis was performed following the standard protocol without IV contrast. COMPARISON:  None. FINDINGS: CT CHEST FINDINGS Cardiovascular: Cardiac size within normal limits. No pericardial effusion. The central pulmonary  arteries are of normal caliber. Mild atherosclerotic calcification is seen scattered throughout the thoracic aorta. No evidence of aortic aneurysm. Mediastinum/Nodes: Thyroid unremarkable. No pathologic thoracic adenopathy. Esophagus unremarkable. Lungs/Pleura: Minimal scarring within the lingula. 7 mm ground-glass nodule is seen within the left lower lobe, axial image # 105/7. The lungs are otherwise clear. No pneumothorax or pleural effusion. Central airways are widely patent. Musculoskeletal: Thoracic osseous structures are age-appropriate. CT ABDOMEN PELVIS FINDINGS Hepatobiliary: Liver and gallbladder are unremarkable. No intra or extrahepatic biliary ductal dilation. Pancreas: Unremarkable Spleen: Absent Adrenals/Urinary Tract: The adrenal glands are unremarkable. The left kidney is severely atrophic. The right kidney is normal in size and position. There is moderate right hydronephrosis and hydroureter to the level of the ureterovesicular junction. A distal obstructing mass or calcification is not identified, though imaging is limited by noncontrast technique. A Foley catheter balloon is seen within the bladder lumen. A large amount of gas is seen within the bladder lumen, more than would be typically expected from simple catheterization. This raises the question of a enteric fistula though, again, official is not clearly identified on this limited examination. Stomach/Bowel: Stomach, and small bowel are unremarkable. Sigmoid colectomy has been performed. Residual colon is unremarkable. Appendix normal. Infiltration of the presacral space may relate to prior surgery. No free intraperitoneal gas or fluid. Vascular/Lymphatic: There is mild aortoiliac atherosclerotic calcification without evidence of aneurysm. No pathologic adenopathy seen within the abdomen and pelvis. Reproductive: The prostate gland is moderately enlarged. Seminal vesicles unremarkable. Other: None significant Musculoskeletal: No acute bone  abnormality within the abdomen and pelvis. Degenerative changes are seen within the lumbar spine. IMPRESSION: 1. Moderate right hydronephrosis and hydroureter to the level of the ureterovesicular junction. A distal obstructing mass or calcification is not identified, though imaging is limited by noncontrast technique. 2. A large amount of gas within the bladder lumen, more than would be typically expected from simple catheterization raising the question of an enteric fistula. Correlation with urinalysis and urine culture may be helpful. Cystography may also be helpful once the patient's acute issues have resolved. 3. 7 mm ground-glass nodule within the left lower lobe. Initial follow-up with CT at 6-12 months is recommended to confirm persistence. If persistent, repeat CT is recommended every 2 years until 5 years of stability has been established. This recommendation follows the consensus statement: Guidelines for Management of Incidental Pulmonary Nodules Detected on CT Images: From the Fleischner Society 2017; Radiology 2017; 284:228-243. 4. Aortic atherosclerosis. Aortic Atherosclerosis (ICD10-I70.0). Electronically Signed   By: Fidela Salisbury MD   On: 02/03/2020 02:03   CT CHEST WO CONTRAST  Result Date: 02/03/2020 CLINICAL DATA:  Unintentional weight loss EXAM: CT CHEST, ABDOMEN AND PELVIS WITHOUT CONTRAST TECHNIQUE: Multidetector CT imaging of the chest, abdomen and pelvis was performed following the standard protocol without IV contrast. COMPARISON:  None. FINDINGS: CT CHEST FINDINGS Cardiovascular: Cardiac size within normal  limits. No pericardial effusion. The central pulmonary arteries are of normal caliber. Mild atherosclerotic calcification is seen scattered throughout the thoracic aorta. No evidence of aortic aneurysm. Mediastinum/Nodes: Thyroid unremarkable. No pathologic thoracic adenopathy. Esophagus unremarkable. Lungs/Pleura: Minimal scarring within the lingula. 7 mm ground-glass nodule is  seen within the left lower lobe, axial image # 105/7. The lungs are otherwise clear. No pneumothorax or pleural effusion. Central airways are widely patent. Musculoskeletal: Thoracic osseous structures are age-appropriate. CT ABDOMEN PELVIS FINDINGS Hepatobiliary: Liver and gallbladder are unremarkable. No intra or extrahepatic biliary ductal dilation. Pancreas: Unremarkable Spleen: Absent Adrenals/Urinary Tract: The adrenal glands are unremarkable. The left kidney is severely atrophic. The right kidney is normal in size and position. There is moderate right hydronephrosis and hydroureter to the level of the ureterovesicular junction. A distal obstructing mass or calcification is not identified, though imaging is limited by noncontrast technique. A Foley catheter balloon is seen within the bladder lumen. A large amount of gas is seen within the bladder lumen, more than would be typically expected from simple catheterization. This raises the question of a enteric fistula though, again, official is not clearly identified on this limited examination. Stomach/Bowel: Stomach, and small bowel are unremarkable. Sigmoid colectomy has been performed. Residual colon is unremarkable. Appendix normal. Infiltration of the presacral space may relate to prior surgery. No free intraperitoneal gas or fluid. Vascular/Lymphatic: There is mild aortoiliac atherosclerotic calcification without evidence of aneurysm. No pathologic adenopathy seen within the abdomen and pelvis. Reproductive: The prostate gland is moderately enlarged. Seminal vesicles unremarkable. Other: None significant Musculoskeletal: No acute bone abnormality within the abdomen and pelvis. Degenerative changes are seen within the lumbar spine. IMPRESSION: 1. Moderate right hydronephrosis and hydroureter to the level of the ureterovesicular junction. A distal obstructing mass or calcification is not identified, though imaging is limited by noncontrast technique. 2. A  large amount of gas within the bladder lumen, more than would be typically expected from simple catheterization raising the question of an enteric fistula. Correlation with urinalysis and urine culture may be helpful. Cystography may also be helpful once the patient's acute issues have resolved. 3. 7 mm ground-glass nodule within the left lower lobe. Initial follow-up with CT at 6-12 months is recommended to confirm persistence. If persistent, repeat CT is recommended every 2 years until 5 years of stability has been established. This recommendation follows the consensus statement: Guidelines for Management of Incidental Pulmonary Nodules Detected on CT Images: From the Fleischner Society 2017; Radiology 2017; 284:228-243. 4. Aortic atherosclerosis. Aortic Atherosclerosis (ICD10-I70.0). Electronically Signed   By: Fidela Salisbury MD   On: 02/03/2020 02:03   US RENAL  Result Date: 02/02/2020 CLINICAL DATA:  Initial evaluation for acute renal failure. History of prior left nephrectomy. EXAM: RENAL / URINARY TRACT ULTRASOUND COMPLETE COMPARISON:  None available. FINDINGS: Right Kidney: Renal measurements: 12.5 x 6.7 x 7.7 cm = volume: 336 mL. Increased echogenicity seen within the renal parenchyma. No shadowing echogenic calculi. Moderate hydronephrosis seen involving the right kidney. Dilatation of the visualized proximal right ureter seen as well. 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 No focal renal mass. Left Kidney: Absent.  No abnormality at the left nephrectomy bed. Bladder: Appears normal for degree of bladder distention. Other: None. IMPRESSION: 1. 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 2. Increased echogenicity within the underlying right renal parenchyma, consistent with medical renal disease. 3. Prior left nephrectomy. Electronically Signed   By: Jeannine Boga M.D.   On:  02/02/2020 21:56   DG Chest Portable  1 View  Result Date: 02/03/2020 CLINICAL DATA:  Tachycardia EXAM: PORTABLE CHEST 1 VIEW COMPARISON:  None. FINDINGS: The heart size and mediastinal contours are within normal limits. Both lungs are clear. The visualized skeletal structures are unremarkable. IMPRESSION: No active disease. Electronically Signed   By: Fidela Salisbury MD   On: 02/03/2020 02:15    Medications: Infusions: . sodium chloride    .  sodium bicarbonate  infusion 1000 mL 100 mL/hr at 02/03/20 0900    Scheduled Medications: . diltiazem  30 mg Oral Q6H  . heparin injection (subcutaneous)  5,000 Units Subcutaneous Q8H  . insulin aspart  0-6 Units Subcutaneous Q4H  . sodium zirconium cyclosilicate  10 g Oral TID    have reviewed scheduled and prn medications.  Physical Exam: General:NAD, comfortable, dry mucous membrane Heart:RRR, s1s2 nl, no rubs Lungs:clear b/l, no crackle Abdomen:soft, Non-tender, non-distended Extremities:No edema Neurology: Alert awake and following commands.  Amzie Sillas Tanna Furry 02/03/2020,1:22 PM  LOS: 1 day  Pager: [phone]

## 2020-02-03 NOTE — ED Notes (Signed)
Blood noted in foley catheter bag. Also had BM that has changed in color, noted to be more red/orange. Dr Marlowe Sax notified. Heparin stopped

## 2020-02-03 NOTE — Anesthesia Preprocedure Evaluation (Addendum)
Anesthesia Evaluation  Patient identified by MRN, date of birth, ID band Patient awake    Reviewed: Allergy & Precautions, NPO status , Patient's Chart, lab work & pertinent test results  Airway Mallampati: II  TM Distance: >3 FB Neck ROM: Limited    Dental  (+) Edentulous Upper, Poor Dentition, Dental Advisory Given   Pulmonary neg pulmonary ROS, former smoker,    Pulmonary exam normal breath sounds clear to auscultation       Cardiovascular hypertension, Normal cardiovascular exam Rhythm:Regular Rate:Normal     Neuro/Psych CVA    GI/Hepatic negative GI ROS, Neg liver ROS,   Endo/Other  negative endocrine ROSdiabetes  Renal/GU Renal disease     Musculoskeletal negative musculoskeletal ROS (+)   Abdominal   Peds  Hematology negative hematology ROS (+)   Anesthesia Other Findings   Reproductive/Obstetrics                            Anesthesia Physical Anesthesia Plan  ASA: IV and emergent  Anesthesia Plan: General   Post-op Pain Management:    Induction: Intravenous and Rapid sequence  PONV Risk Score and Plan: 3 and Ondansetron and Treatment may vary due to age or medical condition  Airway Management Planned: Oral ETT  Additional Equipment:   Intra-op Plan:   Post-operative Plan: Possible Post-op intubation/ventilation  Informed Consent: I have reviewed the patients History and Physical, chart, labs and discussed the procedure including the risks, benefits and alternatives for the proposed anesthesia with the patient or authorized representative who has indicated his/her understanding and acceptance.     Dental advisory given  Plan Discussed with: CRNA  Anesthesia Plan Comments: (+/-aline)       Anesthesia Quick Evaluation

## 2020-02-03 NOTE — Progress Notes (Signed)
  Echocardiogram 2D Echocardiogram has been performed.  Paul Bond 02/03/2020, 5:27 PM

## 2020-02-03 NOTE — Anesthesia Postprocedure Evaluation (Signed)
Anesthesia Post Note  Patient: Paul Bond  Procedure(s) Performed: CYSTOSCOPY WITH RETROGRADE PYELOGRAM/URETERAL DOUBLE J STENT PLACEMENT (Right Ureter)     Patient location during evaluation: PACU Anesthesia Type: General Level of consciousness: sedated and patient cooperative Pain management: pain level controlled Vital Signs Assessment: post-procedure vital signs reviewed and stable Respiratory status: spontaneous breathing Cardiovascular status: stable Anesthetic complications: no   No complications documented.  Last Vitals:  Vitals:   02/03/20 2130 02/03/20 2145  BP: 134/83 (!) 150/78  Pulse: 99 97  Resp: 11 10  Temp:    SpO2: 99% 99%    Last Pain:  Vitals:   02/03/20 2145  TempSrc:   PainSc: 0-No pain                 Nolon Nations

## 2020-02-03 NOTE — ED Notes (Signed)
Patient HR 170's. Dr Marlowe Sax made aware and at bedside

## 2020-02-03 NOTE — ED Notes (Signed)
Social Services at bedside.
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
## 2020-02-03 NOTE — Progress Notes (Addendum)
Overnight event: Patient went into Afib with rate in 170s. BP stable. Seen at bedside and reported chest slight discomfort. Lopressor 5 mg given and rate now in 130s-140s. Cardiologist recommending rate control, unable to cardiovert as he is not anticoagulated. Hgb 10.0 but no prior labs for comparison. FOBT positive. Patient had a bowel movement in the ED and nursing staff confirmed that the the stool was light brown in color. No melena or hematochezia reported.  -Cardizem gtt for rate control -CHA2DS2VASc 6. Started on heparin for anticogulation -Stat CXR, BMP, Troponin pending.   Update 4:30 AM: Chest x-ray done previously showing no active disease. Now converted to sinus rhythm with rate in the 90s. Patient is no longer complaining of chest discomfort and comfortable. Troponin drawn at the time he was in A. fib with RVR elevated at 119, suspect due to demand ischemia. Will continue to trend troponin. Stop Cardizem infusion and start p.o. Cardizem.

## 2020-02-03 NOTE — ED Notes (Signed)
Pt noted to be in NSR HR 90's. Repeat EKG obtained, Dr. Marlowe Sax paged.

## 2020-02-03 NOTE — Progress Notes (Signed)
Attempted to call patient's wife for update.  No answer and VM full.

## 2020-02-03 NOTE — ED Notes (Signed)
Urology at bedside.

## 2020-02-03 NOTE — ED Notes (Signed)
CBG 211 

## 2020-02-03 NOTE — Progress Notes (Signed)
ANTICOAGULATION CONSULT NOTE - Initial Consult  Pharmacy Consult for heparin Indication: atrial fibrillation  No Known Allergies  Patient Measurements: Height: 5\' 7"  (170.2 cm) Weight: 81.6 kg (180 lb) IBW/kg (Calculated) : 66.1  Vital Signs: Temp: 97.6 F (36.4 C) (08/24 2315) Temp Source: Oral (08/24 2315) BP: 129/59 (08/25 0130) Pulse Rate: 163 (08/25 0137)  Labs: Recent Labs    02/02/20 1920 02/02/20 2300  HGB 10.0*  --   HCT 33.8*  --   PLT 143*  --   CREATININE 25.45* 26.09*    Estimated Creatinine Clearance: 2.4 mL/min (A) (by C-G formula based on SCr of 26.09 mg/dL (H)).   Medical History: Past Medical History:  Diagnosis Date  . Cancer ([place])   . Diabetes mellitus without complication ([place])   . Hypertension   . Stroke [facility])     Assessment: 79yo male presents w/ failure to thrive, found to have AKI and electrolyte derangements, eventually went into Afib, unable to cardiovert without anticoagulation >> to start heparin; of note FOBT + but no overt bleeding.  Goal of Therapy:  Heparin level 0.3-0.7 units/ml Monitor platelets by anticoagulation protocol: Yes   Plan:  Will start heparin gtt at 1200 units/hr (will withhold bolus given FOBT+) and monitor heparin levels and CBC.  Wynona Neat, PharmD, BCPS  02/03/2020,2:58 AM

## 2020-02-03 NOTE — Anesthesia Procedure Notes (Signed)
Procedure Name: Intubation Date/Time: 02/03/2020 8:03 PM Performed by: Jearld Pies, CRNA Pre-anesthesia Checklist: Patient identified, Emergency Drugs available, Suction available and Patient being monitored Patient Re-evaluated:Patient Re-evaluated prior to induction Oxygen Delivery Method: Circle System Utilized Preoxygenation: Pre-oxygenation with 100% oxygen Induction Type: IV induction, Rapid sequence and Cricoid Pressure applied Laryngoscope Size: Miller and 2 Grade View: Grade II Tube type: Oral Tube size: 7.5 mm Number of attempts: 1 Airway Equipment and Method: Stylet and Oral airway Placement Confirmation: ETT inserted through vocal cords under direct vision,  positive ETCO2 and breath sounds checked- equal and bilateral Secured at: 22 cm Tube secured with: Tape Dental Injury: Teeth and Oropharynx as per pre-operative assessment

## 2020-02-03 NOTE — Op Note (Signed)
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  This is not resolved/improved with catheter placement.  Urologic consultation was requested.  I have recommended the patient undergo urgent stenting and have explained this to the patient.  Risks and complications were discussed as well.  Unfortunately, family was not able to be contacted.  He presents at this time for that procedure.  Findings: Prostate was minimally obstructive.  There were no urethral lesions.  Bladder was inspected circumferentially.  There were multiple trabeculations and cellules.  Scattered erythematous areas more likely related to probable infection than any lesions.  I saw no urothelial abnormalities other than the erythematous areas.  No stones noted. Ureteral orifice ease normal in configuration location. Upon retrograde study of the right ureter there was mild tortuosity of the mid ureter with narrowing above the proximal tortuosity. I saw no filling defects.  There was moderate/severe pyelocaliectasis.  No renal pelvic filling defects were noted.  Description of procedure: The patient has been properly identified and marked.  He received preoperative IV Ancef.  Was taken the operating room where general endotracheal anesthetic ministered.  He was placed in the dorsolithotomy position.  Genitalia and perineum were  prepped and draped, proper timeout performed.  21 French panendoscope was advanced into his bladder which was circumferentially inspected the above-mentioned findings.  Right ureteral orifice was cannulated with a 6 [place] open-ended catheter and using Omnipaque gentle retrograde pyelogram was performed.  This revealed the above-mentioned findings.  I did not see anything significant for stricture of the ureter although there was mild narrowing proximal to the tortuosity.  Following retrograde study, a sensor tip guidewire was run through the open-ended catheter into the proximal ureter.  The open-ended catheter was advanced over top of the guidewire once adequate positioning was noted.  This was then advanced into the renal pelvis.  The guidewire was removed and hydronephrotic drip verified proper positioning.  The guidewire was then replaced in the open-ended catheter removed.  24 cm x 6 [place] contour double-J stent was then advanced over top of the guidewire and once adequate positioning seen fluoroscopically the guidewire was removed and good curls were seen proximally and distally using fluoroscopy and cystoscopy, respectively.  The scope was then removed after the bladder was drained.  A 16 French Foley catheter was then placed, balloon inflated with 10 cc of water and hooked to dependent drainage.  At this point, the procedure was terminated.  The patient was then awakened, extubated and taken to the PACU in stable condition having tolerated the procedure well.

## 2020-02-03 NOTE — Transfer of Care (Signed)
Immediate Anesthesia Transfer of Care Note  Patient: Paul Bond  Procedure(s) Performed: CYSTOSCOPY WITH RETROGRADE PYELOGRAM/URETERAL DOUBLE J STENT PLACEMENT (Right Ureter)  Patient Location: PACU  Anesthesia Type:General  Level of Consciousness: drowsy, patient cooperative and responds to stimulation  Airway & Oxygen Therapy: Patient Spontanous Breathing and Patient connected to face mask oxygen  Post-op Assessment: Report given to RN and Post -op Vital signs reviewed and stable  Post vital signs: Reviewed and stable  Last Vitals:  Vitals Value Taken Time  BP 161/95 02/03/20 2046  Temp    Pulse 103   Resp 23 02/03/20 2049  SpO2 100   Vitals shown include unvalidated device data.  Last Pain:  Vitals:   02/03/20 8811  TempSrc: Oral  PainSc:          Complications: No complications documented.

## 2020-02-04 ENCOUNTER — Encounter (HOSPITAL_COMMUNITY): Payer: Self-pay | Admitting: Urology

## 2020-02-04 LAB — GLUCOSE, CAPILLARY
Glucose-Capillary: 143 mg/dL — ABNORMAL HIGH (ref 70–99)
Glucose-Capillary: 158 mg/dL — ABNORMAL HIGH (ref 70–99)
Glucose-Capillary: 160 mg/dL — ABNORMAL HIGH (ref 70–99)
Glucose-Capillary: 176 mg/dL — ABNORMAL HIGH (ref 70–99)
Glucose-Capillary: 191 mg/dL — ABNORMAL HIGH (ref 70–99)
Glucose-Capillary: 205 mg/dL — ABNORMAL HIGH (ref 70–99)
Glucose-Capillary: 97 mg/dL (ref 70–99)

## 2020-02-04 LAB — RENAL FUNCTION PANEL
Albumin: 2.9 g/dL — ABNORMAL LOW (ref 3.5–5.0)
Albumin: 2.8 g/dL — ABNORMAL LOW (ref 3.5–5.0)
Anion gap: 21 — ABNORMAL HIGH (ref 5–15)
Anion gap: 19 — ABNORMAL HIGH (ref 5–15)
BUN: 189 mg/dL — ABNORMAL HIGH (ref 8–23)
BUN: 171 mg/dL — ABNORMAL HIGH (ref 8–23)
CO2: 24 mmol/L (ref 22–32)
CO2: 29 mmol/L (ref 22–32)
Calcium: 7.9 mg/dL — ABNORMAL LOW (ref 8.9–10.3)
Calcium: 7.7 mg/dL — ABNORMAL LOW (ref 8.9–10.3)
Chloride: 115 mmol/L — ABNORMAL HIGH (ref 98–111)
Chloride: 109 mmol/L (ref 98–111)
Creatinine, Ser: 19.19 mg/dL — ABNORMAL HIGH (ref 0.61–1.24)
Creatinine, Ser: 16.99 mg/dL — ABNORMAL HIGH (ref 0.61–1.24)
GFR calc Af Amer: 2 mL/min — ABNORMAL LOW (ref >60)
GFR calc Af Amer: 3 mL/min — ABNORMAL LOW (ref >60)
GFR calc non Af Amer: 2 mL/min — ABNORMAL LOW (ref >60)
GFR calc non Af Amer: 2 mL/min — ABNORMAL LOW (ref >60)
Glucose, Bld: 201 mg/dL — ABNORMAL HIGH (ref 70–99)
Glucose, Bld: 154 mg/dL — ABNORMAL HIGH (ref 70–99)
Phosphorus: 8.5 mg/dL — ABNORMAL HIGH (ref 2.5–4.6)
Phosphorus: 8 mg/dL — ABNORMAL HIGH (ref 2.5–4.6)
Potassium: 3.8 mmol/L (ref 3.5–5.1)
Potassium: 4 mmol/L (ref 3.5–5.1)
Sodium: 160 mmol/L — ABNORMAL HIGH (ref 135–145)
Sodium: 157 mmol/L — ABNORMAL HIGH (ref 135–145)

## 2020-02-04 LAB — URINE CULTURE: Culture: NO GROWTH

## 2020-02-04 MED ORDER — PROSOURCE PLUS PO LIQD
30.0000 mL | Freq: Two times a day (BID) | ORAL | Status: DC
  Administered 2020-02-05 – 2020-02-16 (×20): 30 mL via ORAL
  Filled 2020-02-04 (×21): qty 30

## 2020-02-04 MED ORDER — ENSURE ENLIVE PO LIQD
237.0000 mL | Freq: Two times a day (BID) | ORAL | Status: AC
  Administered 2020-02-04 – 2020-02-16 (×16): 237 mL via ORAL

## 2020-02-04 MED ORDER — ADULT MULTIVITAMIN W/MINERALS CH
1.0000 | ORAL_TABLET | Freq: Every day | ORAL | Status: DC
  Administered 2020-02-05 – 2020-02-21 (×17): 1 via ORAL
  Filled 2020-02-04 (×17): qty 1

## 2020-02-04 NOTE — Progress Notes (Signed)
Admitted from PACU to [facility] accompanied by RN,alert and oriented,attached to cardiac monitoring,CCMD notified,Patient oriented to the room and staff,V/S checked.Will continue to closely monitor the patient.

## 2020-02-04 NOTE — Progress Notes (Signed)
@  2138 Dr. Myna Hidalgo on-call for Attending paged regarding pt's current [facility] order in light of present K values (4.0 and previously 3.8).  Page promptly returned and MD confirmed to give [facility] (ordered by Nephrology) and to have Day MD reassess. Medication administered as ordered.
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
## 2020-02-04 NOTE — Progress Notes (Signed)
[facility] NEPHROLOGY PROGRESS NOTE  Assessment/ Plan: Pt is a 79 y.o. yo male with history of hypertension, DM, stroke, presented with failure to thrive, found to have advanced renal failure with BUN 245 creatinine 26 and hyperkalemia.  Kidney ultrasound with solitary kidney and hydronephrosis.  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  CT abdomen pelvis with severely atrophic left kidney and right kidney has moderate hydroureteronephrosis. Urology was consulted and had stent placement on 8/25. He has increasing urine output around 4.2 L and BUN/creatinine level improving.  He is still looks dry on exam.  Acidosis has improved therefore discontinue sodium bicarbonate.  I will increase hypotonic saline 150 cc an hour.  Encourage oral intake of free water.  Discussed with the nursing staff as well.  Repeat lab in the afternoon.  No urgent indication for dialysis today.  Strict ins and out, daily weight.  #Solitary kidney with right hydronephrosis: Seen by urology and underwent cystoscopy and placement of double J stent.  #Hyperkalemia due to AKI on losartan.  Potassium level improved.  #Hypernatremia, hypovolemic: He received normal saline initially for fluid resuscitation.  Discontinuing sodium bicarbonate and currently on hypotonic solution.  Increasing free water as above.  Monitor lab.  #Anion gap metabolic acidosis: Improved with therefore discontinue IV sodium bicarbonate.  #Hypertension: Blood pressure currently acceptable.  Continue to hold losartan and amlodipine.  Volume resuscitation as above.  #Failure to thrive/weight loss: Multiple etiology.  Uremia is contributing as well.  Subjective: Seen and examined.  He looks more alert awake today.  Had urology procedure yesterday with around 4.2 L of urine output.  Denies  nausea vomiting chest pain shortness of breath. Objective Vital signs in last 24 hours: Vitals:   02/04/20 0000 02/04/20 0334 02/04/20 0748 02/04/20 0800  BP:  (!) 156/82 (!) 142/76 140/84  Pulse: 98 97 95 92  Resp:  16 10 13   Temp:  97.7 F (36.5 C) 98.1 F (36.7 C)   TempSrc:  Oral Oral   SpO2:  99% 99% 98%  Weight:      Height:       Weight change: -14.7 kg  Intake/Output Summary (Last 24 hours) at 02/04/2020 0921 Last data filed at 02/04/2020 1740 Gross per 24 hour  Intake 5702.99 ml  Output 4500 ml  Net 1202.99 ml       Labs: Basic Metabolic Panel: Recent Labs  Lab 02/02/20 2300 02/02/20 2300 02/03/20 0215 02/03/20 1950 02/04/20 0224  NA 158*   < > 159* 159* 160*  K 6.7*   < > 5.7* 5.4* 3.8  CL 120*  --  121*  --  115*  CO2 12*  --  9*  --  24  GLUCOSE 119*  --  194*  --  201*  BUN 249*  --  246*  --  189*  CREATININE 26.09*  --  24.28*  --  19.19*  CALCIUM 9.8  --  9.6  --  7.9*  PHOS 11.1*  --  10.3*  --  8.5*   < > = values in this interval not displayed.   Liver Function Tests: Recent Labs  Lab 02/02/20 1920 02/02/20 1920 02/02/20 2300 02/03/20 0215 02/04/20 0224  AST 11*  --   --   --   --   ALT 7  --   --   --   --   Arabella Merles  37*  --   --   --   --   BILITOT 1.2  --   --   --   --   PROT 7.1  --   --   --   --   ALBUMIN 3.5   < > 3.5 3.3* 2.9*   < > = values in this interval not displayed.   No results for input(s): LIPASE, AMYLASE in the last 168 hours. No results for input(s): AMMONIA in the last 168 hours. CBC: Recent Labs  Lab 02/02/20 1920 02/03/20 0644 02/03/20 1950  WBC 10.2 12.7*  --   NEUTROABS 9.0*  --   --   HGB 10.0* 11.0* 10.5*  HCT 33.8* 35.7* 31.0*  MCV 97.1 94.9  --   PLT 143* 149*  --    Cardiac Enzymes: Recent Labs  Lab 02/03/20 1130  CKTOTAL 151   CBG: Recent Labs  Lab 02/03/20 1845 02/03/20 2045 02/04/20 0010 02/04/20 0400 02/04/20 0808  GLUCAP 159* 144* 160* 176* 205*    Iron Studies:   Recent Labs    02/02/20 2250  IRON 135  TIBC 153*  FERRITIN 606*   Studies/Results: CT ABDOMEN PELVIS WO CONTRAST  Result Date: 02/03/2020 CLINICAL DATA:  Unintentional weight loss EXAM: CT CHEST, ABDOMEN AND PELVIS WITHOUT CONTRAST TECHNIQUE: Multidetector CT imaging of the chest, abdomen and pelvis was performed following the standard protocol without IV contrast. COMPARISON:  None. FINDINGS: CT CHEST FINDINGS Cardiovascular: Cardiac size within normal limits. No pericardial effusion. The central pulmonary arteries are of normal caliber. Mild atherosclerotic calcification is seen scattered throughout the thoracic aorta. No evidence of aortic aneurysm. Mediastinum/Nodes: Thyroid unremarkable. No pathologic thoracic adenopathy. Esophagus unremarkable. Lungs/Pleura: Minimal scarring within the lingula. 7 mm ground-glass nodule is seen within the left lower lobe, axial image # 105/7. The lungs are otherwise clear. No pneumothorax or pleural effusion. Central airways are widely patent. Musculoskeletal: Thoracic osseous structures are age-appropriate. CT ABDOMEN PELVIS FINDINGS Hepatobiliary: Liver and gallbladder are unremarkable. No intra or extrahepatic biliary ductal dilation. Pancreas: Unremarkable Spleen: Absent Adrenals/Urinary Tract: The adrenal glands are unremarkable. The left kidney is severely atrophic. The right kidney is normal in size and position. There is moderate right hydronephrosis and hydroureter to the level of the ureterovesicular junction. A distal obstructing mass or calcification is not identified, though imaging is limited by noncontrast technique. A Foley catheter balloon is seen within the bladder lumen. A large amount of gas is seen within the bladder lumen, more than would be typically expected from simple catheterization. This raises the question of a enteric fistula though, again, official is not clearly identified on this limited examination. Stomach/Bowel: Stomach, and  small bowel are unremarkable. Sigmoid colectomy has been performed. Residual colon is unremarkable. Appendix normal. Infiltration of the presacral space may relate to prior surgery. No free intraperitoneal gas or fluid. Vascular/Lymphatic: There is mild aortoiliac atherosclerotic calcification without evidence of aneurysm. No pathologic adenopathy seen within the abdomen and pelvis. Reproductive: The prostate gland is moderately enlarged. Seminal vesicles unremarkable. Other: None significant Musculoskeletal: No acute bone abnormality within the abdomen and pelvis. Degenerative changes are seen within the lumbar spine. IMPRESSION: 1. Moderate right hydronephrosis and hydroureter to the level of the ureterovesicular junction. A distal obstructing mass or calcification is not identified, though imaging is limited by noncontrast technique. 2. A large amount of gas within the bladder lumen, more than would be typically expected from simple catheterization raising the question of an enteric fistula. Correlation with urinalysis  and urine culture may be helpful. Cystography may also be helpful once the patient's acute issues have resolved. 3. 7 mm ground-glass nodule within the left lower lobe. Initial follow-up with CT at 6-12 months is recommended to confirm persistence. If persistent, repeat CT is recommended every 2 years until 5 years of stability has been established. This recommendation follows the consensus statement: Guidelines for Management of Incidental Pulmonary Nodules Detected on CT Images: From the Fleischner Society 2017; Radiology 2017; 284:228-243. 4. Aortic atherosclerosis. Aortic Atherosclerosis (ICD10-I70.0). Electronically Signed   By: Fidela Salisbury MD   On: 02/03/2020 02:03   CT CHEST WO CONTRAST  Result Date: 02/03/2020 CLINICAL DATA:  Unintentional weight loss EXAM: CT CHEST, ABDOMEN AND PELVIS WITHOUT CONTRAST TECHNIQUE: Multidetector CT imaging of the chest, abdomen and pelvis was performed  following the standard protocol without IV contrast. COMPARISON:  None. FINDINGS: CT CHEST FINDINGS Cardiovascular: Cardiac size within normal limits. No pericardial effusion. The central pulmonary arteries are of normal caliber. Mild atherosclerotic calcification is seen scattered throughout the thoracic aorta. No evidence of aortic aneurysm. Mediastinum/Nodes: Thyroid unremarkable. No pathologic thoracic adenopathy. Esophagus unremarkable. Lungs/Pleura: Minimal scarring within the lingula. 7 mm ground-glass nodule is seen within the left lower lobe, axial image # 105/7. The lungs are otherwise clear. No pneumothorax or pleural effusion. Central airways are widely patent. Musculoskeletal: Thoracic osseous structures are age-appropriate. CT ABDOMEN PELVIS FINDINGS Hepatobiliary: Liver and gallbladder are unremarkable. No intra or extrahepatic biliary ductal dilation. Pancreas: Unremarkable Spleen: Absent Adrenals/Urinary Tract: The adrenal glands are unremarkable. The left kidney is severely atrophic. The right kidney is normal in size and position. There is moderate right hydronephrosis and hydroureter to the level of the ureterovesicular junction. A distal obstructing mass or calcification is not identified, though imaging is limited by noncontrast technique. A Foley catheter balloon is seen within the bladder lumen. A large amount of gas is seen within the bladder lumen, more than would be typically expected from simple catheterization. This raises the question of a enteric fistula though, again, official is not clearly identified on this limited examination. Stomach/Bowel: Stomach, and small bowel are unremarkable. Sigmoid colectomy has been performed. Residual colon is unremarkable. Appendix normal. Infiltration of the presacral space may relate to prior surgery. No free intraperitoneal gas or fluid. Vascular/Lymphatic: There is mild aortoiliac atherosclerotic calcification without evidence of aneurysm. No  pathologic adenopathy seen within the abdomen and pelvis. Reproductive: The prostate gland is moderately enlarged. Seminal vesicles unremarkable. Other: None significant Musculoskeletal: No acute bone abnormality within the abdomen and pelvis. Degenerative changes are seen within the lumbar spine. IMPRESSION: 1. Moderate right hydronephrosis and hydroureter to the level of the ureterovesicular junction. A distal obstructing mass or calcification is not identified, though imaging is limited by noncontrast technique. 2. A large amount of gas within the bladder lumen, more than would be typically expected from simple catheterization raising the question of an enteric fistula. Correlation with urinalysis and urine culture may be helpful. Cystography may also be helpful once the patient's acute issues have resolved. 3. 7 mm ground-glass nodule within the left lower lobe. Initial follow-up with CT at 6-12 months is recommended to confirm persistence. If persistent, repeat CT is recommended every 2 years until 5 years of stability has been established. This recommendation follows the consensus statement: Guidelines for Management of Incidental Pulmonary Nodules Detected on CT Images: From the Fleischner Society 2017; Radiology 2017; 284:228-243. 4. Aortic atherosclerosis. Aortic Atherosclerosis (ICD10-I70.0). Electronically Signed   By: Fidela Salisbury MD  On: 02/03/2020 02:03   DG Retrograde Pyelogram  Result Date: 02/04/2020 CLINICAL DATA:  Right hydronephrosis EXAM: RETROGRADE PYELOGRAM COMPARISON:  CT from earlier the same day FINDINGS: Single fluoroscopic spot image documents partial opacification of dilated right renal collecting system and ureter. The mid ureter is distended as well. Distal ureter not visualized. IMPRESSION: Right hydronephrosis and hydroureter. Electronically Signed   By: Lucrezia Europe M.D.   On: 02/04/2020 08:19   US RENAL  Result Date: 02/02/2020 CLINICAL DATA:  Initial evaluation for acute  renal failure. History of prior left nephrectomy. EXAM: RENAL / URINARY TRACT ULTRASOUND COMPLETE COMPARISON:  None available. FINDINGS: Right Kidney: Renal measurements: 12.5 x 6.7 x 7.7 cm = volume: 336 mL. Increased echogenicity seen within the renal parenchyma. No shadowing echogenic calculi. Moderate hydronephrosis seen involving the right kidney. Dilatation of the visualized proximal right ureter seen as well. 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 No focal renal mass. Left Kidney: Absent.  No abnormality at the left nephrectomy bed. Bladder: Appears normal for degree of bladder distention. Other: None. IMPRESSION: 1. 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 2. Increased echogenicity within the underlying right renal parenchyma, consistent with medical renal disease. 3. Prior left nephrectomy. Electronically Signed   By: Jeannine Boga M.D.   On: 02/02/2020 21:56   DG Chest Portable 1 View  Result Date: 02/03/2020 CLINICAL DATA:  Tachycardia EXAM: PORTABLE CHEST 1 VIEW COMPARISON:  None. FINDINGS: The heart size and mediastinal contours are within normal limits. Both lungs are clear. The visualized skeletal structures are unremarkable. IMPRESSION: No active disease. Electronically Signed   By: Fidela Salisbury MD   On: 02/03/2020 02:15   ECHOCARDIOGRAM LIMITED  Result Date: 02/03/2020    ECHOCARDIOGRAM LIMITED REPORT   Patient Name:   Paul Bond [place] Date of Exam: 02/03/2020 Medical Rec #:  [id number]     Height:       67.0 in Accession #:    [id number]    Weight:       180.0 lb Date of Birth:  Apr 08, 1941     BSA:          1.934 m Patient Age:    61 years      BP:           162/78 mmHg Patient Gender: M             HR:           94 bpm. Exam Location:  Inpatient Procedure: Limited Echo, Cardiac Doppler and Color Doppler Indications:    Abnormal ECG  History:        Patient has prior history of Echocardiogram  examinations, most                 recent 06/22/2004. Risk Factors:Former Smoker and Hypertension.                 CKD.  Sonographer:    Clayton Lefort RDCS (AE) Referring Phys: [id number] Noland Fordyce MATHEWS  Sonographer Comments: No parasternal window, no apical window and no subcostal window. IMPRESSIONS  1. The patient has no acoustic windows for TTE. The sonographer attempted to find cardiac structures for nearly 15 minutes. There were no parasternal, apical, or subcostal windows. There is nothing that can be said about cardiac stucture or function due  to non-diagnostic images. Recommend alternative means to assess cardiac function if indicated.  2. Left ventricular endocardial border not optimally defined to evaluate regional  wall motion. FINDINGS  Left Ventricle: Left ventricular endocardial border not optimally defined to evaluate regional wall motion. Eleonore Chiquito MD Electronically signed by Eleonore Chiquito MD Signature Date/Time: 02/03/2020/5:59:38 PM    Final     Medications: Infusions: . sodium chloride 100 mL/hr at 02/04/20 0646  .  ceFAZolin (ANCEF) IV      Scheduled Medications: . Chlorhexidine Gluconate Cloth  6 each Topical Daily  . diltiazem  30 mg Oral Q6H  . heparin injection (subcutaneous)  5,000 Units Subcutaneous Q8H  . insulin aspart  0-6 Units Subcutaneous Q4H  . sodium zirconium cyclosilicate  10 g Oral TID    have reviewed scheduled and prn medications.  Physical Exam: General:NAD, comfortable, dry mucous membrane Heart:RRR, s1s2 nl, no rubs Lungs:clear b/l, no crackle Abdomen:soft, Non-tender, non-distended Extremities:No edema Neurology: Alert awake and following commands. Urology: Has Foley catheter and bag has pinkish urine.  Rooney Gladwin Prasad Savyon Loken 02/04/2020,9:21 AM  LOS: 2 days  Pager: [phone]

## 2020-02-04 NOTE — Progress Notes (Signed)
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   MVI daily   NUTRITION DIAGNOSIS:   Increased nutrient needs related to acute illness as evidenced by estimated needs.  GOAL:   Patient will meet greater than or equal to 90% of their needs  MONITOR:   PO intake, Supplement acceptance, Weight trends, Labs, I & O's  REASON FOR ASSESSMENT:   Consult Assessment of nutrition requirement/status  ASSESSMENT:   Patient with PMH significant for DM, HTN, cancer (unknown type), and stroke. Presents this admission with failure to thrive and AKI.   8/25- cystoscopy, stent placement   Ultrasound shows solitary kidney with hydronephrosis. Unable to reach pt at this time. H&P states pt is typically very active (mows his own grass and does his own yard work). Family started to notice a decline in PO intake over the last three weeks. Pt reports he just lost his appetite. No meal completions charted this admission. RD to provide supplementation to maximize kcal and protein.   Unsure of pt's UBW. Records lack weight history over the last year. Suspect malnutrition but unable to diagnose with NFPE or further history.   Drips: 1/2NS @ 150 ml/hr  Medications: SS novolog, lokelma 10 g TID Labs: Na 160 (H) BUN 189 (H) Cr 19.19 (H) Phosphorus 8.5 (H)   Diet Order:   Diet Order            Diet renal/carb modified with fluid restriction Diet-HS Snack? Nothing; Fluid restriction: 1200 mL Fluid; Room service appropriate? Yes; Fluid consistency: Thin  Diet effective now                 EDUCATION NEEDS:   Not appropriate for education at this time  Skin:  Skin Assessment: Reviewed RN Assessment  Last BM:  8/25  Height:   Ht Readings from Last 1 Encounters:  02/03/20 5'  7" (1.702 m)    Weight:   Wt Readings from Last 1 Encounters:  02/03/20 66.9 kg    BMI:  Body mass index is 23.1 kg/m.  Estimated Nutritional Needs:   Kcal:  2150-2350 kcal  Protein:  110-125 grams  Fluid:  >/= 2.1 L/day   Mariana Single RD, LDN Clinical Nutrition Pager listed in [place]

## 2020-02-05 LAB — GLUCOSE, CAPILLARY
Glucose-Capillary: 111 mg/dL — ABNORMAL HIGH (ref 70–99)
Glucose-Capillary: 104 mg/dL — ABNORMAL HIGH (ref 70–99)
Glucose-Capillary: 133 mg/dL — ABNORMAL HIGH (ref 70–99)
Glucose-Capillary: 192 mg/dL — ABNORMAL HIGH (ref 70–99)
Glucose-Capillary: 160 mg/dL — ABNORMAL HIGH (ref 70–99)

## 2020-02-05 LAB — RENAL FUNCTION PANEL
Albumin: 2.8 g/dL — ABNORMAL LOW (ref 3.5–5.0)
Anion gap: 19 — ABNORMAL HIGH (ref 5–15)
BUN: 159 mg/dL — ABNORMAL HIGH (ref 8–23)
CO2: 26 mmol/L (ref 22–32)
Calcium: 7.6 mg/dL — ABNORMAL LOW (ref 8.9–10.3)
Chloride: 109 mmol/L (ref 98–111)
Creatinine, Ser: 15.33 mg/dL — ABNORMAL HIGH (ref 0.61–1.24)
GFR calc Af Amer: 3 mL/min — ABNORMAL LOW (ref >60)
GFR calc non Af Amer: 3 mL/min — ABNORMAL LOW (ref >60)
Glucose, Bld: 106 mg/dL — ABNORMAL HIGH (ref 70–99)
Phosphorus: 8.1 mg/dL — ABNORMAL HIGH (ref 2.5–4.6)
Potassium: 3.6 mmol/L (ref 3.5–5.1)
Sodium: 154 mmol/L — ABNORMAL HIGH (ref 135–145)

## 2020-02-05 MED ORDER — CALCIUM ACETATE (PHOS BINDER) 667 MG PO CAPS
1334.0000 mg | ORAL_CAPSULE | Freq: Three times a day (TID) | ORAL | Status: DC
  Administered 2020-02-05 – 2020-02-07 (×6): 1334 mg via ORAL
  Filled 2020-02-05 (×6): qty 2

## 2020-02-05 MED ORDER — AMLODIPINE BESYLATE 5 MG PO TABS: 5.0000 mg | ORAL_TABLET | Freq: Every day | ORAL | Status: DC

## 2020-02-05 NOTE — Progress Notes (Addendum)
Paul Bond  Assessment/ Plan: Pt is a 79 y.o. yo male with history of hypertension, DM, stroke, presented with failure to thrive, found to have advanced renal failure with BUN 245 creatinine 26 and hyperkalemia.  Kidney ultrasound with solitary kidney and hydronephrosis.  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  CT abdomen pelvis with severely atrophic left kidney and right kidney has moderate hydroureteronephrosis. Urology was consulted and had stent placement on 8/25. Increasing urine output and creatinine level continue to improve.  Plan to continue IV fluid and watch for renal recovery.  No need for dialysis. Strict ins and out, daily weight.  #Solitary kidney with right hydronephrosis: Seen by urology and underwent cystoscopy and placement of double J stent.  #Hyperkalemia due to AKI on losartan.  Potassium level improved.  #Hypernatremia, hypovolemic: Continue hypotonic saline.  Serum sodium level improving.  Monitor lab.  #Anion gap metabolic acidosis: Improved, off sodium bicarbonate.  #Hypertension: Blood pressure is going up.  On diltiazem.  Continue to hold losartan.    #Failure to thrive/weight loss, severe protein calorie malnutrition: Multiple etiology.  Uremia is contributing as well.  Continue oral supplement and management of renal failure as above.  #Hyperphosphatemia: Start PhosLo.  Checking PTH.  Subjective: Seen and examined.  He looks more alert awake today.  Urine output 3.4 L.  Denies nausea vomiting chest pain shortness of breath.  Objective Vital signs in last 24 hours: Vitals:   02/05/20 0411 02/05/20 0700 02/05/20 0748 02/05/20 1120  BP: (!) 147/73  (!) 153/74 (!) 153/77  Pulse: 86     Resp: 12     Temp: 98.2 F (36.8 C) 98 F (36.7 C) 98 F (36.7 C) 97.8 F (36.6  C)  TempSrc: Oral  Oral Oral  SpO2: 99%     Weight: 66.5 kg     Height:       Weight change: -0.4 kg  Intake/Output Summary (Last 24 hours) at 02/05/2020 1130 Last data filed at 02/05/2020 0750 Gross per 24 hour  Intake 2553.5 ml  Output 3225 ml  Net -671.5 ml       Labs: Basic Metabolic Panel: Recent Labs  Lab 02/04/20 0224 02/04/20 1733 02/05/20 0317  NA 160* 157* 154*  K 3.8 4.0 3.6  CL 115* 109 109  CO2 24 29 26   GLUCOSE 201* 154* 106*  BUN 189* 171* 159*  CREATININE 19.19* 16.99* 15.33*  CALCIUM 7.9* 7.7* 7.6*  PHOS 8.5* 8.0* 8.1*   Liver Function Tests: Recent Labs  Lab 02/02/20 1920 02/02/20 2300 02/04/20 0224 02/04/20 1733 02/05/20 0317  AST 11*  --   --   --   --   ALT 7  --   --   --   --   ALKPHOS 37*  --   --   --   --   BILITOT 1.2  --   --   --   --   PROT 7.1  --   --   --   --   ALBUMIN 3.5   < > 2.9* 2.8* 2.8*   < > = values in this interval not displayed.   No results for input(s): LIPASE, AMYLASE in the last 168 hours. No results for input(s): AMMONIA in the last 168 hours. CBC: Recent Labs  Lab 02/02/20 1920 02/03/20 0644 02/03/20 1950  WBC 10.2 12.7*  --  NEUTROABS 9.0*  --   --   HGB 10.0* 11.0* 10.5*  HCT 33.8* 35.7* 31.0*  MCV 97.1 94.9  --   PLT 143* 149*  --    Cardiac Enzymes: Recent Labs  Lab 02/03/20 1130  CKTOTAL 151   CBG: Recent Labs  Lab 02/04/20 1620 02/04/20 2018 02/04/20 2358 02/05/20 0410 02/05/20 0747  GLUCAP 143* 158* 97 111* 104*    Iron Studies:  Recent Labs    02/02/20 2250  IRON 135  TIBC 153*  FERRITIN 606*   Studies/Results: DG Retrograde Pyelogram  Result Date: 02/04/2020 CLINICAL DATA:  Right hydronephrosis EXAM: RETROGRADE PYELOGRAM COMPARISON:  CT from earlier the same day FINDINGS: Single fluoroscopic spot image documents partial opacification of dilated right renal collecting system and ureter. The mid ureter is distended as well. Distal ureter not visualized. IMPRESSION:  Right hydronephrosis and hydroureter. Electronically Signed   By: Lucrezia Europe M.D.   On: 02/04/2020 08:19   ECHOCARDIOGRAM LIMITED  Result Date: 02/03/2020    ECHOCARDIOGRAM LIMITED REPORT   Patient Name:   Paul Bond Date of Exam: 02/03/2020 Medical Rec #:  [id number]     Height:       67.0 in Accession #:    [id number]    Weight:       180.0 lb Date of Birth:  11/16/1940     BSA:          1.934 m Patient Age:    38 years      BP:           162/78 mmHg Patient Gender: M             HR:           94 bpm. Exam Location:  Inpatient Procedure: Limited Echo, Cardiac Doppler and Color Doppler Indications:    Abnormal ECG  History:        Patient has prior history of Echocardiogram examinations, most                 recent 06/22/2004. Risk Factors:Former Smoker and Hypertension.                 CKD.  Sonographer:    Clayton Lefort RDCS (AE) Referring Phys: [id number] Noland Fordyce MATHEWS  Sonographer Comments: No parasternal window, no apical window and no subcostal window. IMPRESSIONS  1. The patient has no acoustic windows for TTE. The sonographer attempted to find cardiac structures for nearly 15 minutes. There were no parasternal, apical, or subcostal windows. There is nothing that can be said about cardiac stucture or function due  to non-diagnostic images. Recommend alternative means to assess cardiac function if indicated.  2. Left ventricular endocardial border not optimally defined to evaluate regional wall motion. FINDINGS  Left Ventricle: Left ventricular endocardial border not optimally defined to evaluate regional wall motion. Eleonore Chiquito MD Electronically signed by Eleonore Chiquito MD Signature Date/Time: 02/03/2020/5:59:38 PM    Final     Medications: Infusions: . sodium chloride 150 mL/hr at 02/05/20 0808  .  ceFAZolin (ANCEF) IV      Scheduled Medications: . (feeding supplement) PROSource Plus  30 mL Oral BID BM  . Chlorhexidine Gluconate Cloth  6 each Topical Daily  . diltiazem  30 mg Oral Q6H  .  feeding supplement (ENSURE ENLIVE)  237 mL Oral BID BM  . heparin injection (subcutaneous)  5,000 Units Subcutaneous Q8H  . insulin aspart  0-6 Units Subcutaneous Q4H  . multivitamin with minerals  1 tablet Oral Daily    have reviewed scheduled and prn medications.  Physical Exam: General:NAD, comfortable, more alert and awake. Heart:RRR, s1s2 nl, no rubs Lungs:clear b/l, no crackle Abdomen:soft, Non-tender, non-distended Extremities:No edema Neurology: Alert awake and following commands. Urology: Has Foley catheter and bag has pinkish urine.  Paul Bond 02/05/2020,11:30 AM  LOS: 3 days  Pager: [phone]
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
## 2020-02-06 LAB — RENAL FUNCTION PANEL
Albumin: 2.6 g/dL — ABNORMAL LOW (ref 3.5–5.0)
Anion gap: 20 — ABNORMAL HIGH (ref 5–15)
BUN: 142 mg/dL — ABNORMAL HIGH (ref 8–23)
CO2: 23 mmol/L (ref 22–32)
Calcium: 7.8 mg/dL — ABNORMAL LOW (ref 8.9–10.3)
Chloride: 107 mmol/L (ref 98–111)
Creatinine, Ser: 12.91 mg/dL — ABNORMAL HIGH (ref 0.61–1.24)
GFR calc Af Amer: 4 mL/min — ABNORMAL LOW (ref >60)
GFR calc non Af Amer: 3 mL/min — ABNORMAL LOW (ref >60)
Glucose, Bld: 126 mg/dL — ABNORMAL HIGH (ref 70–99)
Phosphorus: 7.6 mg/dL — ABNORMAL HIGH (ref 2.5–4.6)
Potassium: 3 mmol/L — ABNORMAL LOW (ref 3.5–5.1)
Sodium: 150 mmol/L — ABNORMAL HIGH (ref 135–145)

## 2020-02-06 LAB — GLUCOSE, CAPILLARY
Glucose-Capillary: 119 mg/dL — ABNORMAL HIGH (ref 70–99)
Glucose-Capillary: 126 mg/dL — ABNORMAL HIGH (ref 70–99)
Glucose-Capillary: 131 mg/dL — ABNORMAL HIGH (ref 70–99)
Glucose-Capillary: 164 mg/dL — ABNORMAL HIGH (ref 70–99)
Glucose-Capillary: 234 mg/dL — ABNORMAL HIGH (ref 70–99)
Glucose-Capillary: 257 mg/dL — ABNORMAL HIGH (ref 70–99)

## 2020-02-06 LAB — MAGNESIUM: Magnesium: 2.3 mg/dL (ref 1.7–2.4)

## 2020-02-06 MED ORDER — POTASSIUM CHLORIDE CRYS ER 20 MEQ PO TBCR
40.0000 meq | EXTENDED_RELEASE_TABLET | Freq: Every day | ORAL | Status: DC
  Administered 2020-02-06 – 2020-02-12 (×7): 40 meq via ORAL
  Filled 2020-02-06 (×7): qty 2

## 2020-02-06 MED ORDER — POTASSIUM CHLORIDE 10 MEQ/100ML IV SOLN
10.0000 meq | INTRAVENOUS | Status: AC
  Administered 2020-02-06 (×3): 10 meq via INTRAVENOUS
  Filled 2020-02-06 (×3): qty 100
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
## 2020-02-06 NOTE — Progress Notes (Signed)
[facility] NEPHROLOGY PROGRESS NOTE  Assessment/ Plan: Pt is a 79 y.o. yo male with history of hypertension, DM, stroke, presented with failure to thrive, found to have advanced renal failure with BUN 245 creatinine 26 and hyperkalemia.  Kidney ultrasound with solitary kidney and hydronephrosis.  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  CT abdomen pelvis with severely atrophic left kidney and right kidney has moderate hydroureteronephrosis. Urology was consulted and had stent placement on 8/25. Increasing urine output and creatinine level continue to improve, creatinine 12.91 today.  Plan to continue IV fluid and watch for renal recovery.  No need for dialysis. Strict ins and out, daily weight.  #Solitary kidney with right hydronephrosis: Seen by urology and underwent cystoscopy and placement of double J stent.  #Hyperkalemia due to AKI on losartan.  Hyperkalemia improved with medical management.  Now he is becoming hypokalemic with recovering AKI/relieving obstruction.  Replete potassium chloride.  Magnesium level acceptable.  #Hypernatremia, hypovolemic: Continue hypotonic saline.  Serum sodium level improving.  Monitor lab.  #Anion gap metabolic acidosis: Improved, off sodium bicarbonate.  #Hypertension: Blood pressure is going up.  On diltiazem.  Continue to hold losartan.    #Failure to thrive/weight loss, severe protein calorie malnutrition: Multiple etiology.  Uremia is contributing as well.  Continue oral supplement and management of renal failure as above.  #Hyperphosphatemia: Continue PhosLo.  Pending PTH.  Subjective: Seen and examined.  Urine output 4 L, pinkish urine.  Alert awake and denies any complaint.  No nausea vomiting chest pain shortness of breath.  Objective Vital signs in last 24 hours: Vitals:    02/06/20 0038 02/06/20 0339 02/06/20 0430 02/06/20 0749  BP: (!) 150/71  (!) 143/72 (!) 145/80  Pulse: 89  88 90  Resp: 16  11 17   Temp: 98.8 F (37.1 C)  98.3 F (36.8 C) 98.1 F (36.7 C)  TempSrc: Oral Oral Oral Oral  SpO2: 97%  98% 99%  Weight:      Height:       Weight change:   Intake/Output Summary (Last 24 hours) at 02/06/2020 1055 Last data filed at 02/06/2020 0747 Gross per 24 hour  Intake 3452.43 ml  Output 4101 ml  Net -648.57 ml       Labs: Basic Metabolic Panel: Recent Labs  Lab 02/04/20 1733 02/05/20 0317 02/06/20 0204  NA 157* 154* 150*  K 4.0 3.6 3.0*  CL 109 109 107  CO2 29 26 23   GLUCOSE 154* 106* 126*  BUN 171* 159* 142*  CREATININE 16.99* 15.33* 12.91*  CALCIUM 7.7* 7.6* 7.8*  PHOS 8.0* 8.1* 7.6*   Liver Function Tests: Recent Labs  Lab 02/02/20 1920 02/02/20 2300 02/04/20 1733 02/05/20 0317 02/06/20 0204  AST 11*  --   --   --   --   ALT 7  --   --   --   --   ALKPHOS 37*  --   --   --   --   BILITOT 1.2  --   --   --   --   PROT 7.1  --   --   --   --   ALBUMIN 3.5   < > 2.8* 2.8* 2.6*   < > = values in this interval not displayed.   No results for input(s): LIPASE, AMYLASE in the last 168 hours. No results for input(s): AMMONIA in the  last 168 hours. CBC: Recent Labs  Lab 02/02/20 1920 02/03/20 0644 02/03/20 1950  WBC 10.2 12.7*  --   NEUTROABS 9.0*  --   --   HGB 10.0* 11.0* 10.5*  HCT 33.8* 35.7* 31.0*  MCV 97.1 94.9  --   PLT 143* 149*  --    Cardiac Enzymes: Recent Labs  Lab 02/03/20 1130  CKTOTAL 151   CBG: Recent Labs  Lab 02/05/20 1549 02/05/20 2051 02/06/20 0037 02/06/20 0427 02/06/20 0752  GLUCAP 192* 160* 131* 119* 126*    Iron Studies:  No results for input(s): IRON, TIBC, TRANSFERRIN, FERRITIN in the last 72 hours. Studies/Results: No results found.  Medications: Infusions: . sodium chloride 150 mL/hr at 02/06/20 0553  .  ceFAZolin (ANCEF) IV    . potassium chloride 10 mEq (02/06/20  1003)    Scheduled Medications: . (feeding supplement) PROSource Plus  30 mL Oral BID BM  . calcium acetate  1,334 mg Oral TID WC  . Chlorhexidine Gluconate Cloth  6 each Topical Daily  . diltiazem  30 mg Oral Q6H  . feeding supplement (ENSURE ENLIVE)  237 mL Oral BID BM  . heparin injection (subcutaneous)  5,000 Units Subcutaneous Q8H  . insulin aspart  0-6 Units Subcutaneous Q4H  . multivitamin with minerals  1 tablet Oral Daily  . potassium chloride  40 mEq Oral Daily    have reviewed scheduled and prn medications.  Physical Exam: General: Alert awake and comfortable Heart:RRR, s1s2 nl, no rubs Lungs:clear b/l, no crackle Abdomen:soft, Non-tender, non-distended Extremities:No edema Neurology: Alert awake and following commands. Urology: Has Foley catheter and bag has pinkish urine.  Paul Bond 02/06/2020,10:55 AM  LOS: 4 days  Pager: [phone]

## 2020-02-07 LAB — RENAL FUNCTION PANEL
Albumin: 2.4 g/dL — ABNORMAL LOW (ref 3.5–5.0)
Anion gap: 11 (ref 5–15)
BUN: 115 mg/dL — ABNORMAL HIGH (ref 8–23)
CO2: 23 mmol/L (ref 22–32)
Calcium: 7.6 mg/dL — ABNORMAL LOW (ref 8.9–10.3)
Chloride: 105 mmol/L (ref 98–111)
Creatinine, Ser: 9.4 mg/dL — ABNORMAL HIGH (ref 0.61–1.24)
GFR calc Af Amer: 6 mL/min — ABNORMAL LOW (ref >60)
GFR calc non Af Amer: 5 mL/min — ABNORMAL LOW (ref >60)
Glucose, Bld: 179 mg/dL — ABNORMAL HIGH (ref 70–99)
Phosphorus: 5.2 mg/dL — ABNORMAL HIGH (ref 2.5–4.6)
Potassium: 3.6 mmol/L (ref 3.5–5.1)
Sodium: 139 mmol/L (ref 135–145)

## 2020-02-07 LAB — GLUCOSE, CAPILLARY
Glucose-Capillary: 127 mg/dL — ABNORMAL HIGH (ref 70–99)
Glucose-Capillary: 153 mg/dL — ABNORMAL HIGH (ref 70–99)
Glucose-Capillary: 182 mg/dL — ABNORMAL HIGH (ref 70–99)
Glucose-Capillary: 203 mg/dL — ABNORMAL HIGH (ref 70–99)
Glucose-Capillary: 206 mg/dL — ABNORMAL HIGH (ref 70–99)
Glucose-Capillary: 235 mg/dL — ABNORMAL HIGH (ref 70–99)

## 2020-02-07 LAB — PARATHYROID HORMONE, INTACT (NO CA): PTH: 332 pg/mL — ABNORMAL HIGH (ref 15–65)

## 2020-02-07 MED ORDER — SODIUM CHLORIDE 0.9 % IV SOLN: INTRAVENOUS | Status: DC
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
## 2020-02-07 NOTE — Progress Notes (Signed)
Paul Bond  Assessment/ Plan: Pt is a 79 y.o. yo male with history of hypertension, DM, stroke, presented with failure to thrive, found to have advanced renal failure with BUN 245 creatinine 26 and hyperkalemia.  Kidney ultrasound with solitary kidney and hydronephrosis.  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  CT abdomen pelvis with severely atrophic left kidney and right kidney has moderate hydroureteronephrosis. Urology was consulted and had stent placement on 8/25. Increasing urine output and creatinine level continue to improve, creatinine 9.4 today.  Reduce IV fluid rate.  Strict ins and out, daily weight.  Watch for renal recovery.  No need for dialysis.  #Solitary kidney with right hydronephrosis: Seen by urology and underwent cystoscopy and placement of double J stent.  #Hyperkalemia due to AKI on losartan.  Hyperkalemia improved with medical management.  Now he is becoming hypokalemic because of recovering AKI/relieving obstruction.  Replete potassium chloride.  Magnesium level acceptable.  #Hypernatremia, hypovolemic: Improved.  Discontinue hypotonic saline and start lower rate of NS.    #Anion gap metabolic acidosis: Improved, off sodium bicarbonate.  #Hypertension: Blood pressure is going up.  On diltiazem.  Continue to hold losartan.    #Failure to thrive/weight loss, severe protein calorie malnutrition: Multiple etiology.  Uremia is contributing as well.  Continue oral supplement and management of renal failure as above.  #Hyperphosphatemia: Continue PhosLo.  Pending PTH.  Subjective: Seen and examined.  Urine output 3.8 L, pinkish urine.  Alert awake and denies any complaint.  No nausea vomiting chest pain shortness of breath.  No new event.  Objective Vital signs in last 24  hours: Vitals:   02/07/20 0007 02/07/20 0503 02/07/20 0550 02/07/20 0744  BP: 130/70 (!) 143/62  (!) 142/67  Pulse: 80 78  80  Resp: 16 15  12   Temp: 99.2 F (37.3 C) 98.7 F (37.1 C)  98.4 F (36.9 C)  TempSrc: Oral Oral  Oral  SpO2: 99% 99%  99%  Weight:   67.5 kg   Height:   5\' 7"  (1.702 m)    Weight change:   Intake/Output Summary (Last 24 hours) at 02/07/2020 1030 Last data filed at 02/07/2020 0755 Gross per 24 hour  Intake 1939 ml  Output 3425 ml  Net -1486 ml       Labs: Basic Metabolic Panel: Recent Labs  Lab 02/05/20 0317 02/06/20 0204 02/07/20 0255  NA 154* 150* 139  K 3.6 3.0* 3.6  CL 109 107 105  CO2 26 23 23   GLUCOSE 106* 126* 179*  BUN 159* 142* 115*  CREATININE 15.33* 12.91* 9.40*  CALCIUM 7.6* 7.8* 7.6*  PHOS 8.1* 7.6* 5.2*   Liver Function Tests: Recent Labs  Lab 02/02/20 1920 02/02/20 2300 02/05/20 0317 02/06/20 0204 02/07/20 0255  AST 11*  --   --   --   --   ALT 7  --   --   --   --   ALKPHOS 37*  --   --   --   --   BILITOT 1.2  --   --   --   --   PROT 7.1  --   --   --   --   ALBUMIN 3.5   < > 2.8* 2.6* 2.4*   < > = values in this interval not displayed.   No results for input(s): LIPASE, AMYLASE in the  last 168 hours. No results for input(s): AMMONIA in the last 168 hours. CBC: Recent Labs  Lab 02/02/20 1920 02/03/20 0644 02/03/20 1950  WBC 10.2 12.7*  --   NEUTROABS 9.0*  --   --   HGB 10.0* 11.0* 10.5*  HCT 33.8* 35.7* 31.0*  MCV 97.1 94.9  --   PLT 143* 149*  --    Cardiac Enzymes: Recent Labs  Lab 02/03/20 1130  CKTOTAL 151   CBG: Recent Labs  Lab 02/06/20 1551 02/06/20 2003 02/07/20 0009 02/07/20 0450 02/07/20 0743  GLUCAP 257* 234* 182* 153* 127*    Iron Studies:  No results for input(s): IRON, TIBC, TRANSFERRIN, FERRITIN in the last 72 hours. Studies/Results: No results found.  Medications: Infusions: . sodium chloride    .  ceFAZolin (ANCEF) IV      Scheduled Medications: . (feeding  supplement) PROSource Plus  30 mL Oral BID BM  . calcium acetate  1,334 mg Oral TID WC  . Chlorhexidine Gluconate Cloth  6 each Topical Daily  . diltiazem  30 mg Oral Q6H  . feeding supplement (ENSURE ENLIVE)  237 mL Oral BID BM  . heparin injection (subcutaneous)  5,000 Units Subcutaneous Q8H  . insulin aspart  0-6 Units Subcutaneous Q4H  . multivitamin with minerals  1 tablet Oral Daily  . potassium chloride  40 mEq Oral Daily    have reviewed scheduled and prn medications.  Physical Exam: General: Alert awake and comfortable Heart:RRR, s1s2 nl, no rubs Lungs: Clear b/l, no crackle Abdomen:soft, Non-tender, non-distended Extremities: No edema Neurology: Alert awake and following commands. Urology: Has Foley catheter and bag has pinkish urine.  Paul Bond 02/07/2020,10:30 AM  LOS: 5 days  Pager: [phone]

## 2020-02-07 NOTE — Evaluation (Signed)
Physical Therapy Evaluation Patient Details Name: Paul Bond MRN: [id number] DOB: Dec 03, 1940 Today's Date: 02/07/2020   History of Present Illness  Paul Bond is a 79 y.o. male with medical history significant of history of cancer, diabetes, hypertension, stroke presenting to the ED via EMS from home for evaluation of failure to thrive.  Patient was hypotensive with EMS and given 800 cc normal saline in route. patient is normally quite physically active and cuts his own grass/does yard work.  He normally takes care of his wife as well.  For the past 3 weeks leading up to admission he has not been eating and has been bedbound; Principal problem -- Acute Kidney Injury  Clinical Impression   Pt admitted with above diagnosis. Comes from home where he lives with family, and was independent with amb and mobility until the few weeks leading up to this admission; Presents to PT with generalized weakness and fatigue, effecting functional mobility;  Pt currently with functional limitations due to the deficits listed below (see PT Problem List). Pt will benefit from skilled PT to increase their independence and safety with mobility to allow discharge to the venue listed below.       Follow Up Recommendations Home health PT;Other (comment) (If slow progress, must consider SNF for rehab)    Equipment Recommendations  Rolling walker with 5" wheels;3in1 (PT)    Recommendations for Other Services       Precautions / Restrictions Precautions Precautions: Fall Restrictions Weight Bearing Restrictions: No      Mobility  Bed Mobility Overal bed mobility: Needs Assistance Bed Mobility: Supine to Sit     Supine to sit: Min assist     General bed mobility comments: Min handheld assist to pull to sit  Transfers Overall transfer level: Needs assistance Equipment used: 1 person hand held assist;Rolling walker (2 wheeled) Transfers: Sit to/from Stand Sit to Stand: Mod assist          General transfer comment: Light mod assist t power up; initially stood without a RW, and noted heavy dependence on bracing backs of LEs against bed; second time, with RW with better anterior weight shift  Ambulation/Gait Ambulation/Gait assistance: Min assist;+2 safety/equipment Gait Distance (Feet): 5 Feet (sidesteps to recliner palced at foot of bed) Assistive device: Rolling walker (2 wheeled) Gait Pattern/deviations: Shuffle     General Gait Details: Very weak, and dependent on RW for support; short sidesteps  Financial trader Rankin (Stroke Patients Only)       Balance                                             Pertinent Vitals/Pain Pain Assessment: No/denies pain    Home Living Family/patient expects to be discharged to:: Private residence Living Arrangements: Spouse/significant other Available Help at Discharge: Family;Available PRN/intermittently Type of Home: House Home Access: Stairs to enter   [employer] of Steps: 3 Home Layout: One level Home Equipment: None      Prior Function Level of Independence: Independent         Comments: Enjoys fishing     Journalist, newspaper        Extremity/Trunk Assessment   Upper Extremity Assessment Upper Extremity Assessment: Generalized weakness    Lower Extremity Assessment Lower Extremity Assessment: Generalized weakness  Communication   Communication: No difficulties  Cognition Arousal/Alertness: Awake/alert Behavior During Therapy: WFL for tasks assessed/performed Overall Cognitive Status: Within Functional Limits for tasks assessed                                        General Comments General comments (skin integrity, edema, etc.): VSS; session conducted on room air    Exercises     Assessment/Plan    PT Assessment Patient needs continued PT services  PT Problem List Decreased strength;Decreased  activity tolerance;Decreased balance;Decreased mobility;Decreased knowledge of use of DME;Decreased safety awareness       PT Treatment Interventions DME instruction;Gait training;Stair training;Functional mobility training;Therapeutic activities;Balance training;Therapeutic exercise;Patient/family education    PT Goals (Current goals can be found in the Care Plan section)  Acute Rehab PT Goals Patient Stated Goal: Hopes to get better soon PT Goal Formulation: With patient Time For Goal Achievement: 02/21/20 Potential to Achieve Goals: Good    Frequency Min 3X/week   Barriers to discharge        Co-evaluation               AM-PAC PT "6 Clicks" Mobility  Outcome Measure Help needed turning from your back to your side while in a flat bed without using bedrails?: A [place] Help needed moving from lying on your back to sitting on the side of a flat bed without using bedrails?: A [place] Help needed moving to and from a bed to a chair (including a wheelchair)?: A Esch Help needed standing up from a chair using your arms (e.g., wheelchair or bedside chair)?: A [place] Help needed to walk in hospital room?: A Lot Help needed climbing 3-5 steps with a railing? : Total 6 Click Score: 15    End of Session Equipment Utilized During Treatment: Gait belt Activity Tolerance: Patient tolerated treatment well Patient left: in chair;with call bell/phone within reach;with chair alarm set Nurse Communication: Mobility status PT Visit Diagnosis: Unsteadiness on feet (R26.81);Other abnormalities of gait and mobility (R26.89);Muscle weakness (generalized) (M62.81)    Time: 0383-3383 PT Time Calculation (min) (ACUTE ONLY): 18 min   Charges:   PT Evaluation $PT Eval Moderate Complexity: 1 Mod          Roney [place], [place]  Acute Rehabilitation Services Pager [phone] Office [phone]   Colletta [place] 02/07/2020, 4:16 PM

## 2020-02-08 LAB — GLUCOSE, CAPILLARY
Glucose-Capillary: 103 mg/dL — ABNORMAL HIGH (ref 70–99)
Glucose-Capillary: 157 mg/dL — ABNORMAL HIGH (ref 70–99)
Glucose-Capillary: 175 mg/dL — ABNORMAL HIGH (ref 70–99)
Glucose-Capillary: 230 mg/dL — ABNORMAL HIGH (ref 70–99)
Glucose-Capillary: 316 mg/dL — ABNORMAL HIGH (ref 70–99)
Glucose-Capillary: 83 mg/dL (ref 70–99)

## 2020-02-08 LAB — RENAL FUNCTION PANEL
Albumin: 2.4 g/dL — ABNORMAL LOW (ref 3.5–5.0)
Anion gap: 14 (ref 5–15)
BUN: 101 mg/dL — ABNORMAL HIGH (ref 8–23)
CO2: 19 mmol/L — ABNORMAL LOW (ref 22–32)
Calcium: 8.1 mg/dL — ABNORMAL LOW (ref 8.9–10.3)
Chloride: 109 mmol/L (ref 98–111)
Creatinine, Ser: 7.81 mg/dL — ABNORMAL HIGH (ref 0.61–1.24)
GFR calc Af Amer: 7 mL/min — ABNORMAL LOW (ref >60)
GFR calc non Af Amer: 6 mL/min — ABNORMAL LOW (ref >60)
Glucose, Bld: 113 mg/dL — ABNORMAL HIGH (ref 70–99)
Phosphorus: 4.2 mg/dL (ref 2.5–4.6)
Potassium: 3.9 mmol/L (ref 3.5–5.1)
Sodium: 142 mmol/L (ref 135–145)

## 2020-02-08 NOTE — TOC Initial Note (Signed)
Transition of Care [facility]) - Initial/Assessment Note    Patient Details  Name: Paul Bond MRN: [id number] Date of Birth: [birth year]  Transition of Care [facility]) CM/SW Contact:    Vinie Sill, [place] Phone Number: 02/08/2020, 2:11 PM  Clinical Narrative:                  CSW visit with the patient at bedside. CSW introduced self and explained role. CSW discussed with patient PT recommendation of short term rehab at [facility]. Patient states lives in the home with his spouse. Patient is agreeable to rehab at [facility]. CSW explained the SNF process. Patient states no preferred SNF. He reports he has NOT received covid vaccines and is not interested in getting vaccine. Patient states no other questions at this time.   CSW will provide bed offers once available. CSW will continue to follow and assist with discharge planning.  Thurmond Butts, MSW, [place] Clinical Social Worker   Expected Discharge Plan: Skilled Nursing Facility Barriers to Discharge: Continued Medical Work up, SNF Pending bed offer   Patient Goals and CMS Choice        Expected Discharge Plan and Services Expected Discharge Plan: [place] In-house Referral: Clinical Social Work     Living arrangements for the past 2 months: Single Family Home                                      Prior Living Arrangements/Services Living arrangements for the past 2 months: Single Family Home Lives with:: Self, Spouse Patient language and need for interpreter reviewed:: No        Need for Family Participation in Patient Care: Yes (Comment) Care giver support system in place?: Yes (comment)   Criminal Activity/Legal Involvement Pertinent to Current Situation/Hospitalization: No - Comment as needed  Activities of Daily Living      Permission Sought/Granted Permission sought to share information with : Family Supports Permission granted to share information with : Yes, Verbal Permission Granted  Share  Information with NAME: Paul Bond  Permission granted to share info w AGENCY: SNFs  Permission granted to share info w Relationship: spouse  Permission granted to share info w Contact Information: [phone]  Emotional Assessment Appearance:: Appears stated age Attitude/Demeanor/Rapport: Engaged Affect (typically observed): Accepting, Appropriate Orientation: : Oriented to Self, Oriented to Place, Oriented to  Time, Oriented to Situation Alcohol / Substance Use: Not Applicable Psych Involvement: No (comment)  Admission diagnosis:  Dehydration [E86.0] Hyperkalemia [E87.5] Acute renal disease [N28.9] Acute renal failure (ARF) (HCC) [N17.9] AKI (acute kidney injury) ([place]) [N17.9] Patient Active Problem List   Diagnosis Date Noted  . AKI (acute kidney injury) ([place]) 02/02/2020  . Hyperkalemia 02/02/2020  . Hypermagnesemia 02/02/2020  . Hypernatremia 02/02/2020  . Metabolic acidosis 99/35/7017   PCP:  [facility]   [place], [place] [address] Phone: [phone] Fax: [place], [place] [phone] [place] [address] Phone: [phone] Fax: [phone]     Social Determinants of Health (SDOH) Interventions    Readmission Risk Interventions No flowsheet data found.
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
## 2020-02-08 NOTE — Progress Notes (Signed)
[facility] ROUNDING NOTE   Subjective:   This is a 79 year old male with a history of hypertension diabetes stroke presented with failure to thrive and advanced renal failure with a BUN of 245 and a creatinine of 26 and hyperkalemia.  Renal ultrasound showed a solitary kidney and hydronephrosis.  CT scan of abdomen and pelvis showed right kidney hydronephrosis urology consulted stent placed 02/03/2020 increasing urine output and improved renal function.  Blood pressure 124/65 pulse 91 temperature 97.5 O2 sats 100% room air.  Urine output 3.2 L 02/07/2020  Sodium 142 potassium 3.9 chloride 109 CO2 19 BUN 101 creatinine 7.81 glucose 113 calcium 8.1 phosphorus 4.2 albumin  2.4   Objective:  Vital signs in last 24 hours:  Temp:  [97.5 F (36.4 C)-98.4 F (36.9 C)] 97.5 F (36.4 C) (08/30 0346) Pulse Rate:  [66-97] 91 (08/30 0754) Resp:  [11-16] 14 (08/30 0754) BP: (106-135)/(54-73) 124/65 (08/30 0754) SpO2:  [96 %-100 %] 100 % (08/30 0754)  Weight change:  Filed Weights   02/03/20 2213 02/05/20 0411 02/07/20 0550  Weight: 66.9 kg 66.5 kg 67.5 kg    Intake/Output: I/O last 3 completed shifts: In: 245 [P.O.:360; I.V.:310] Out: 5650 [Urine:5650]   Intake/Output this shift:  No intake/output data recorded.  CVS- RRR RS- CTA ABD- BS present soft non-distended EXT- no edema   Basic Metabolic Panel: Recent Labs  Lab 02/02/20 1920 02/02/20 2300 02/04/20 1733 02/04/20 1733 02/05/20 0317 02/05/20 0317 02/06/20 0204 02/06/20 0848 02/07/20 0255 02/08/20 0202  NA 156*   < > 157*  --  154*  --  150*  --  139 142  K >7.5*   < > 4.0  --  3.6  --  3.0*  --  3.6 3.9  CL 119*   < > 109  --  109  --  107  --  105 109  CO2 11*   < > 29  --  26  --  23  --  23 19*  GLUCOSE 173*   < > 154*  --  106*  --  126*  --  179* 113*  BUN 245*   < > 171*  --  159*  --  142*  --  115* 101*  CREATININE 25.45*   < > 16.99*  --  15.33*  --  12.91*  --  9.40* 7.81*  CALCIUM 9.2   <  > 7.7*   < > 7.6*   < > 7.8*  --  7.6* 8.1*  MG 4.0*  --   --   --   --   --   --  2.3  --   --   PHOS  --    < > 8.0*  --  8.1*  --  7.6*  --  5.2* 4.2   < > = values in this interval not displayed.    Liver Function Tests: Recent Labs  Lab 02/02/20 1920 02/02/20 2300 02/04/20 1733 02/05/20 0317 02/06/20 0204 02/07/20 0255 02/08/20 0202  AST 11*  --   --   --   --   --   --   ALT 7  --   --   --   --   --   --   ALKPHOS 37*  --   --   --   --   --   --   BILITOT 1.2  --   --   --   --   --   --  PROT 7.1  --   --   --   --   --   --   ALBUMIN 3.5   < > 2.8* 2.8* 2.6* 2.4* 2.4*   < > = values in this interval not displayed.   No results for input(s): LIPASE, AMYLASE in the last 168 hours. No results for input(s): AMMONIA in the last 168 hours.  CBC: Recent Labs  Lab 02/02/20 1920 02/03/20 0644 02/03/20 1950  WBC 10.2 12.7*  --   NEUTROABS 9.0*  --   --   HGB 10.0* 11.0* 10.5*  HCT 33.8* 35.7* 31.0*  MCV 97.1 94.9  --   PLT 143* 149*  --     Cardiac Enzymes: Recent Labs  Lab 02/03/20 1130  CKTOTAL 151    BNP: Invalid input(s): POCBNP  CBG: Recent Labs  Lab 02/07/20 1711 02/07/20 2125 02/08/20 0006 02/08/20 0425 02/08/20 0756  GLUCAP 235* 206* 157* 16 103*    Microbiology: Results for orders placed or performed during the hospital encounter of 02/02/20  Urine culture     Status: None   Collection Time: 02/02/20 10:05 PM   Specimen: Urine, Random  Result Value Ref Range Status   Specimen Description URINE, RANDOM  Final   Special Requests NONE  Final   Culture   Final    NO GROWTH Performed at [facility], [place] [address] [place], [place] [address]    Report Status 02/04/2020 FINAL  Final  SARS Coronavirus 2 by RT PCR (hospital order, performed in [facility] hospital lab) Nasopharyngeal Nasopharyngeal Swab     Status: None   Collection Time: 02/02/20 10:07 PM   Specimen: Nasopharyngeal Swab  Result Value Ref Range Status   SARS  Coronavirus 2 NEGATIVE NEGATIVE Final    Comment: (NOTE) SARS-CoV-2 target nucleic acids are NOT DETECTED.  The SARS-CoV-2 RNA is generally detectable in upper and lower respiratory specimens during the acute phase of infection. The lowest concentration of SARS-CoV-2 viral copies this assay can detect is 250 copies / mL. A negative result does not preclude SARS-CoV-2 infection and should not be used as the sole basis for treatment or other patient management decisions.  A negative result may occur with improper specimen collection / handling, submission of specimen other than nasopharyngeal swab, presence of viral mutation(s) within the areas targeted by this assay, and inadequate number of viral copies (<250 copies / mL). A negative result must be combined with clinical observations, patient history, and epidemiological information.  Fact Sheet for Patients:   StrictlyIdeas.no  Fact Sheet for Healthcare Providers: BankingDealers.co.za  This test is not yet approved or  cleared by the [place] FDA and has been authorized for detection and/or diagnosis of SARS-CoV-2 by FDA under an Emergency Use Authorization (EUA).  This EUA will remain in effect (meaning this test can be used) for the duration of the COVID-19 declaration under Section 564(b)(1) of the Act, 21 U.S.C. section 360bbb-3(b)(1), unless the authorization is terminated or revoked sooner.  Performed at [facility], [place] [address] [place], [place] [address]     Coagulation Studies: No results for input(s): LABPROT, INR in the last 72 hours.  Urinalysis: No results for input(s): COLORURINE, LABSPEC, PHURINE, GLUCOSEU, HGBUR, BILIRUBINUR, KETONESUR, PROTEINUR, UROBILINOGEN, NITRITE, LEUKOCYTESUR in the last 72 hours.  Invalid input(s): APPERANCEUR    Imaging: No results found.   Medications:   . sodium chloride 100 mL/hr at 02/07/20 2252  .  ceFAZolin  (ANCEF) IV     . (  feeding supplement) PROSource Plus  30 mL Oral BID BM  . calcium acetate  1,334 mg Oral TID WC  . Chlorhexidine Gluconate Cloth  6 each Topical Daily  . diltiazem  30 mg Oral Q6H  . feeding supplement (ENSURE ENLIVE)  237 mL Oral BID BM  . heparin injection (subcutaneous)  5,000 Units Subcutaneous Q8H  . insulin aspart  0-6 Units Subcutaneous Q4H  . multivitamin with minerals  1 tablet Oral Daily  . 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  CT abdomen pelvis with severely atrophic left kidney and right kidney has moderate hydroureteronephrosis. Urology was consulted and had stent placement on 8/25. Increasing urine output and creatinine level continue to improve.     Continues on normal saline 100 cc an hour  #Solitary kidney with right hydronephrosis: Seen by urology and underwent cystoscopy and placement of double J stent.  #Hyperkalemia due to AKI on losartan.  Hyperkalemia has improved.  #Hypernatremia, hypovolemic: Improved.  Continues on normal saline 100 cc an hour  #Anion gap metabolic acidosis: Improved, off sodium bicarbonate.  #Hypertension: Hypertension appears well-controlled   #Failure to thrive/weight loss, severe protein calorie malnutrition: Multiple etiology.  Uremia is contributing as well.  Continue oral supplement and management of renal failure as above.  #Hyperphosphatemia: Resolved will discontinue binders.   PTH 332   LOS: 6 Sherril Croon @TODAY @8 :28 AM

## 2020-02-08 NOTE — NC FL2 (Signed)
[place] LEVEL OF CARE SCREENING TOOL     IDENTIFICATION  Patient Name: Paul Bond Birthdate: 1940/10/06 Sex: male Admission Date (Current Location): 02/02/2020  [facility] and [place] Number:  Herbalist and Address:  [place]. [facility], [place] [address] [place], [place] [address]      Provider Number: [id number]  Attending Physician Name and Address:  Georgette Shell, MD  Relative Name and Phone Number:  Lisandro Meggett    Current Level of Care: Hospital Recommended Level of Care: [place] Prior Approval Number:    Date Approved/Denied:   PASRR Number: [id number]  Discharge Plan: SNF    Current Diagnoses: Patient Active Problem List   Diagnosis Date Noted  . AKI (acute kidney injury) ([place]) 02/02/2020  . Hyperkalemia 02/02/2020  . Hypermagnesemia 02/02/2020  . Hypernatremia 02/02/2020  . Metabolic acidosis 04/04/8526    Orientation RESPIRATION BLADDER Height & Weight     Self, Time, Situation, Place  Normal Continent Weight: 148 lb 13 oz (67.5 kg) Height:  5\' 7"  (170.2 cm)  BEHAVIORAL SYMPTOMS/MOOD NEUROLOGICAL BOWEL NUTRITION STATUS      Continent Diet (See discharge summary)  AMBULATORY STATUS COMMUNICATION OF NEEDS Skin   Limited Assist Verbally Normal                       Personal Care Assistance Level of Assistance  Bathing, Dressing, Feeding Bathing Assistance: Limited assistance Feeding assistance: Independent Dressing Assistance: Limited assistance     Functional Limitations Info  Sight, Speech, Hearing Sight Info: Adequate Hearing Info: Adequate Speech Info: Adequate    SPECIAL CARE FACTORS FREQUENCY  PT (By licensed PT), OT (By licensed OT)     PT Frequency: 5x a week OT Frequency: 5x a week            Contractures Contractures Info: Not present    Additional Factors Info  Code Status, Allergies Code Status Info: Full Allergies Info: NKA           Current  Medications (02/08/2020):  This is the current hospital active medication list Current Facility-Administered Medications  Medication Dose Route Frequency Provider Last Rate Last Admin  . (feeding supplement) PROSource Plus liquid 30 mL  30 mL Oral BID BM Georgette Shell, MD   30 mL at 02/08/20 1035  . 0.9 %  sodium chloride infusion   Intravenous Continuous Rosita Fire, MD 100 mL/hr at 02/07/20 2252 New Bag at 02/07/20 2252  . acetaminophen (TYLENOL) tablet 650 mg  650 mg Oral Q6H PRN [name], MD       Or  . acetaminophen (TYLENOL) suppository 650 mg  650 mg Rectal Q6H PRN [name], MD      . Chlorhexidine Gluconate Cloth 2 % PADS 6 each  6 each Topical Daily Georgette Shell, MD   6 each at 02/08/20 1035  . diltiazem (CARDIZEM) tablet 30 mg  30 mg Oral Q6H [name], MD   30 mg at 02/08/20 [phone]  . feeding supplement (ENSURE ENLIVE) (ENSURE ENLIVE) liquid 237 mL  237 mL Oral BID BM Georgette Shell, MD   237 mL at 02/08/20 1035  . heparin injection 5,000 Units  5,000 Units Subcutaneous Q8H [name], MD   5,000 Units at 02/08/20 [phone]  . insulin aspart (novoLOG) injection 0-6 Units  0-6 Units Subcutaneous Q4H [name], MD   1 Units at 02/08/20 0051  . multivitamin with minerals tablet  1 tablet  1 tablet Oral Daily Georgette Shell, MD   1 tablet at 02/08/20 1034  . potassium chloride SA (KLOR-CON) CR tablet 40 mEq  40 mEq Oral Daily Rosita Fire, MD   40 mEq at 02/08/20 1035     Discharge Medications: Please see discharge summary for a list of discharge medications.  Relevant Imaging Results:  Relevant Lab Results:   Additional Information SSN [id number]  Waylan [place], [place]

## 2020-02-09 LAB — RENAL FUNCTION PANEL
Albumin: 2.4 g/dL — ABNORMAL LOW (ref 3.5–5.0)
Anion gap: 12 (ref 5–15)
BUN: 90 mg/dL — ABNORMAL HIGH (ref 8–23)
CO2: 20 mmol/L — ABNORMAL LOW (ref 22–32)
Calcium: 8.3 mg/dL — ABNORMAL LOW (ref 8.9–10.3)
Chloride: 109 mmol/L (ref 98–111)
Creatinine, Ser: 6.5 mg/dL — ABNORMAL HIGH (ref 0.61–1.24)
GFR calc Af Amer: 9 mL/min — ABNORMAL LOW (ref >60)
GFR calc non Af Amer: 7 mL/min — ABNORMAL LOW (ref >60)
Glucose, Bld: 163 mg/dL — ABNORMAL HIGH (ref 70–99)
Phosphorus: 4 mg/dL (ref 2.5–4.6)
Potassium: 4.5 mmol/L (ref 3.5–5.1)
Sodium: 141 mmol/L (ref 135–145)

## 2020-02-09 LAB — GLUCOSE, CAPILLARY
Glucose-Capillary: 131 mg/dL — ABNORMAL HIGH (ref 70–99)
Glucose-Capillary: 133 mg/dL — ABNORMAL HIGH (ref 70–99)
Glucose-Capillary: 166 mg/dL — ABNORMAL HIGH (ref 70–99)
Glucose-Capillary: 169 mg/dL — ABNORMAL HIGH (ref 70–99)
Glucose-Capillary: 191 mg/dL — ABNORMAL HIGH (ref 70–99)
Glucose-Capillary: 259 mg/dL — ABNORMAL HIGH (ref 70–99)
Glucose-Capillary: 293 mg/dL — ABNORMAL HIGH (ref 70–99)

## 2020-02-09 NOTE — Progress Notes (Signed)
Physical Therapy Treatment Patient Details Name: Paul Bond MRN: [id number] DOB: [birth year] Today's Date: 02/09/2020    History of Present Illness Paul Bond is a 79 y.o. male with medical history significant of history of cancer, diabetes, hypertension, stroke presenting to the ED via EMS from home for evaluation of failure to thrive.  Patient was hypotensive with EMS and given 800 cc normal saline in route. patient is normally quite physically active and cuts his own grass/does yard work.  He normally takes care of his wife as well.  For the past 3 weeks leading up to admission he has not been eating and has been bedbound; Principal problem -- Acute Kidney Injury    PT Comments    Patient progressing slowly towards PT goals. Pt drowsy today and needs cues to stay attended to task. Pt fatigues very quickly with mobility requiring 2 seated rest breaks even after a short distance. Requires use of RW for support. VSS on RA. Demonstrates weakness and decreased activity tolerance/endurance. Continues to recommend SNF. Will follow.   Follow Up Recommendations  SNF;Supervision for mobility/OOB     Equipment Recommendations  Rolling walker with 5" wheels;3in1 (PT)    Recommendations for Other Services       Precautions / Restrictions Precautions Precautions: Fall Restrictions Weight Bearing Restrictions: No    Mobility  Bed Mobility Overal bed mobility: Needs Assistance Bed Mobility: Supine to Sit     Supine to sit: Min guard;HOB elevated     General bed mobility comments: Increased time to get to EOB with cues for sequencing.  Transfers Overall transfer level: Needs assistance Equipment used: Rolling walker (2 wheeled) Transfers: Sit to/from Stand Sit to Stand: Min assist         General transfer comment: Min A to power to standing with cues for hand placement; stood from EOB x1, from chair x2, Cues for upright. Transferred to chair post  ambulation.  Ambulation/Gait Ambulation/Gait assistance: Min assist;+2 safety/equipment Gait Distance (Feet): 5 Feet (+5' + 8') Assistive device: Rolling walker (2 wheeled) Gait Pattern/deviations: Shuffle;Trunk flexed Gait velocity: decreased Gait velocity interpretation: <1.31 ft/sec, indicative of household ambulator General Gait Details: Slow, mildly unsteady gait with shuffling steps; 2 seated rest breaks; fatigues quickly. HR 110-126 bpm.   Stairs             Wheelchair Mobility    Modified Rankin (Stroke Patients Only)       Balance Overall balance assessment: Needs assistance Sitting-balance support: Feet supported;No upper extremity supported Sitting balance-[name] Scale: Fair     Standing balance support: During functional activity Standing balance-[name] Scale: Poor Standing balance comment: Requires UE support and external support                            Cognition Arousal/Alertness: Lethargic Behavior During Therapy: WFL for tasks assessed/performed Overall Cognitive Status: No family/caregiver present to determine baseline cognitive functioning Area of Impairment: Attention;Problem solving                   Current Attention Level: Sustained         Problem Solving: Slow processing;Decreased initiation;Requires verbal cues General Comments: Slow processing; needs repetition of cues. Decreased attention today.      Exercises General Exercises - Lower Extremity Ankle Circles/Pumps: AROM;Both;10 reps;Supine    General Comments General comments (skin integrity, edema, etc.): VSS on RA.      Pertinent Vitals/Pain Pain Assessment: No/denies pain  Home Living                      Prior Function            PT Goals (current goals can now be found in the care plan section) Progress towards PT goals: Progressing toward goals    Frequency    Min 3X/week      PT Plan Current plan remains appropriate     Co-evaluation              AM-PAC PT "6 Clicks" Mobility   Outcome Measure  Help needed turning from your back to your side while in a flat bed without using bedrails?: A [place] Help needed moving from lying on your back to sitting on the side of a flat bed without using bedrails?: A [place]razia Help needed moving to and from a bed to a chair (including a wheelchair)?: A Huyser Help needed standing up from a chair using your arms (e.g., wheelchair or bedside chair)?: A Dejonge Help needed to walk in hospital room?: A [place] Help needed climbing 3-5 steps with a railing? : Total 6 Click Score: 16    End of Session Equipment Utilized During Treatment: Gait belt Activity Tolerance: Patient limited by fatigue Patient left: in chair;with call bell/phone within reach;with chair alarm set Nurse Communication: Mobility status PT Visit Diagnosis: Unsteadiness on feet (R26.81);Other abnormalities of gait and mobility (R26.89);Muscle weakness (generalized) (M62.81)     Time: 9977-4142 PT Time Calculation (min) (ACUTE ONLY): 21 min  Charges:  $Gait Training: 8-22 mins                     Marisa Severin, PT, DPT Acute Rehabilitation Services Pager [phone] Office [phone]       [place] 02/09/2020, 1:14 PM

## 2020-02-09 NOTE — Progress Notes (Signed)
Inpatient Diabetes Program Recommendations  AACE/ADA: New Consensus Statement on Inpatient Glycemic Control (2015)  Target Ranges:  Prepandial:   less than 140 mg/dL      Peak postprandial:   less than 180 mg/dL (1-2 hours)      Critically ill patients:  140 - 180 mg/dL   Results for Paul Bond, Paul Bond (MRN [id number]) as of 02/09/2020 07:47  Ref. Range 02/06/2020 07:52 02/06/2020 11:45 02/06/2020 15:51 02/06/2020 20:03  Glucose-Capillary Latest Ref Range: 70 - 99 mg/dL 126 (H) 164 (H)  237 ml Ensure Enlive 257 (H) 234 (H)   Results for Paul Bond, Paul Bond (MRN [id number]) as of 02/09/2020 07:47  Ref. Range 02/07/2020 07:43 02/07/2020 11:50 02/07/2020 17:11 02/07/2020 21:25  Glucose-Capillary Latest Ref Range: 70 - 99 mg/dL 127 (H) 203 (H)  Refused Ensure Enlive at 10am and 2pm 235 (H) 206 (H)   Results for Paul Bond, Paul Bond (MRN [id number]) as of 02/09/2020 07:47  Ref. Range 02/08/2020 07:56 02/08/2020 11:50 02/08/2020 15:50 02/08/2020 20:34  Glucose-Capillary Latest Ref Range: 70 - 99 mg/dL 103 (H) 175 (H)  237 ml Ensure Enlive given 10:35am and  237 ml Ensure Enlive given 1:36pm 230 (H) 316 (H)    Home DM Meds: Lantus 18 units Daily  Current Orders: Novolog 0-6 units Q4 hours    MD- Based on current CBG trends, it looks as if pt's CBGs are becoming elevated after he drinks his Ensure Enlive PO supplements.  Not always agreeing to drink them, however, did agree to drink both yesterday 8/30.  May consider adding a specific Novolog order to be given when patient agrees to drink the Ensure Enlive to help combat the glucose elevations after drinking them.  Could try Novolog 2 units BID to be given with the Ensure Enlive between meals  HOLD if patient refuses to drink them     --Will follow patient during hospitalization--  Wyn Quaker RN, MSN, CDE Diabetes Coordinator Inpatient Glycemic Control Team Team Pager: [phone] (8a-5p)
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
## 2020-02-09 NOTE — Progress Notes (Signed)
[facility] ROUNDING NOTE   Subjective:   This is a 79 year old male with a history of hypertension diabetes stroke presented with failure to thrive and advanced renal failure with a BUN of 245 and a creatinine of 26 and hyperkalemia.  Renal ultrasound showed a solitary kidney and hydronephrosis.  CT scan of abdomen and pelvis showed right kidney hydronephrosis urology consulted stent placed 02/03/2020 increasing urine output and improved renal function.  Blood pressure 153/67 pulse 72 temperature 98.7 O2 sats 99% room air urine output 3.2 L 02/08/2020  Sodium 141 potassium 4.5 chloride 109 CO2 20 BUN 90 creatinine 6.5 glucose 163 calcium 8.3 albumin 2.5   Objective:  Vital signs in last 24 hours:  Temp:  [98.4 F (36.9 C)-99 F (37.2 C)] 98.7 F (37.1 C) (08/31 0322) Pulse Rate:  [75-97] 75 (08/31 0400) Resp:  [12-16] 12 (08/31 0400) BP: (124-156)/(65-74) 153/67 (08/31 0400) SpO2:  [96 %-100 %] 98 % (08/31 0400)  Weight change:  Filed Weights   02/03/20 2213 02/05/20 0411 02/07/20 0550  Weight: 66.9 kg 66.5 kg 67.5 kg    Intake/Output: I/O last 3 completed shifts: In: 3356.8 [I.V.:3356.8] Out: 7616 [Urine:5325]   Intake/Output this shift:  No intake/output data recorded.  General: Alert awake and comfortable Heart:RRR, s1s2 nl, no rubs Lungs: Clear b/l, no crackle Abdomen:soft, Non-tender, non-distended Extremities: No edema Neurology: Alert awake and following commands. Urology: Has Foley catheter and bag has pinkish urine.   Basic Metabolic Panel: Recent Labs  Lab 02/02/20 1920 02/02/20 2300 02/05/20 0317 02/05/20 0317 02/06/20 0204 02/06/20 0204 02/06/20 0848 02/07/20 0255 02/08/20 0202 02/09/20 0144  NA 156*   < > 154*  --  150*  --   --  139 142 141  K >7.5*   < > 3.6  --  3.0*  --   --  3.6 3.9 4.5  CL 119*   < > 109  --  107  --   --  105 109 109  CO2 11*   < > 26  --  23  --   --  23 19* 20*  GLUCOSE 173*   < > 106*  --  126*  --    --  179* 113* 163*  BUN 245*   < > 159*  --  142*  --   --  115* 101* 90*  CREATININE 25.45*   < > 15.33*  --  12.91*  --   --  9.40* 7.81* 6.50*  CALCIUM 9.2   < > 7.6*   < > 7.8*   < >  --  7.6* 8.1* 8.3*  MG 4.0*  --   --   --   --   --  2.3  --   --   --   PHOS  --    < > 8.1*  --  7.6*  --   --  5.2* 4.2 4.0   < > = values in this interval not displayed.    Liver Function Tests: Recent Labs  Lab 02/02/20 1920 02/02/20 2300 02/05/20 0317 02/06/20 0204 02/07/20 0255 02/08/20 0202 02/09/20 0144  AST 11*  --   --   --   --   --   --   ALT 7  --   --   --   --   --   --   ALKPHOS 37*  --   --   --   --   --   --   BILITOT  1.2  --   --   --   --   --   --   PROT 7.1  --   --   --   --   --   --   ALBUMIN 3.5   < > 2.8* 2.6* 2.4* 2.4* 2.4*   < > = values in this interval not displayed.   No results for input(s): LIPASE, AMYLASE in the last 168 hours. No results for input(s): AMMONIA in the last 168 hours.  CBC: Recent Labs  Lab 02/02/20 1920 02/03/20 0644 02/03/20 1950  WBC 10.2 12.7*  --   NEUTROABS 9.0*  --   --   HGB 10.0* 11.0* 10.5*  HCT 33.8* 35.7* 31.0*  MCV 97.1 94.9  --   PLT 143* 149*  --     Cardiac Enzymes: Recent Labs  Lab 02/03/20 1130  CKTOTAL 151    BNP: Invalid input(s): POCBNP  CBG: Recent Labs  Lab 02/08/20 1150 02/08/20 1550 02/08/20 2034 02/09/20 0012 02/09/20 0325  GLUCAP 175* 230* 316* 169* 131*    Microbiology: Results for orders placed or performed during the hospital encounter of 02/02/20  Urine culture     Status: None   Collection Time: 02/02/20 10:05 PM   Specimen: Urine, Random  Result Value Ref Range Status   Specimen Description URINE, RANDOM  Final   Special Requests NONE  Final   Culture   Final    NO GROWTH Performed at [facility], [place] [address], [place], [place] [address]    Report Status 02/04/2020 FINAL  Final  SARS Coronavirus 2 by RT PCR (hospital order, performed in [facility] hospital lab)  Nasopharyngeal Nasopharyngeal Swab     Status: None   Collection Time: 02/02/20 10:07 PM   Specimen: Nasopharyngeal Swab  Result Value Ref Range Status   SARS Coronavirus 2 NEGATIVE NEGATIVE Final    Comment: (NOTE) SARS-CoV-2 target nucleic acids are NOT DETECTED.  The SARS-CoV-2 RNA is generally detectable in upper and lower respiratory specimens during the acute phase of infection. The lowest concentration of SARS-CoV-2 viral copies this assay can detect is 250 copies / mL. A negative result does not preclude SARS-CoV-2 infection and should not be used as the sole basis for treatment or other patient management decisions.  A negative result may occur with improper specimen collection / handling, submission of specimen other than nasopharyngeal swab, presence of viral mutation(s) within the areas targeted by this assay, and inadequate number of viral copies (<250 copies / mL). A negative result must be combined with clinical observations, patient history, and epidemiological information.  Fact Sheet for Patients:   StrictlyIdeas.no  Fact Sheet for Healthcare Providers: BankingDealers.co.za  This test is not yet approved or  cleared by the [place] FDA and has been authorized for detection and/or diagnosis of SARS-CoV-2 by FDA under an Emergency Use Authorization (EUA).  This EUA will remain in effect (meaning this test can be used) for the duration of the COVID-19 declaration under Section 564(b)(1) of the Act, 21 U.S.C. section 360bbb-3(b)(1), unless the authorization is terminated or revoked sooner.  Performed at [facility], [place] [address], [place], [place] [address]     Coagulation Studies: No results for input(s): LABPROT, INR in the last 72 hours.  Urinalysis: No results for input(s): COLORURINE, LABSPEC, PHURINE, GLUCOSEU, HGBUR, BILIRUBINUR, KETONESUR, PROTEINUR, UROBILINOGEN, NITRITE, LEUKOCYTESUR in the  last 72 hours.  Invalid input(s): APPERANCEUR    Imaging: No results found.  Medications:   . sodium chloride 100 mL/hr at 02/09/20 0706   . (feeding supplement) PROSource Plus  30 mL Oral BID BM  . Chlorhexidine Gluconate Cloth  6 each Topical Daily  . diltiazem  30 mg Oral Q6H  . feeding supplement (ENSURE ENLIVE)  237 mL Oral BID BM  . heparin injection (subcutaneous)  5,000 Units Subcutaneous Q8H  . insulin aspart  0-6 Units Subcutaneous Q4H  . multivitamin with minerals  1 tablet Oral Daily  . 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  CT abdomen pelvis with severely atrophic left kidney and right kidney has moderate hydroureteronephrosis. Urology was consulted and had stent placement on 8/25. Increasing urine output and creatinine level continue to improve.     Continues on normal saline 100 cc an hour  #Solitary kidney with right hydronephrosis: Seen by urology and underwent cystoscopy and placement of double J stent.  #Hyperkalemia due to AKI on losartan.  Hyperkalemia has improved.  #Hypernatremia, hypovolemic: Improved.  Continues on normal saline 100 cc an hour  #Anion gap metabolic acidosis: Improved, off sodium bicarbonate.  #Hypertension: Hypertension appears well-controlled   #Failure to thrive/weight loss, severe protein calorie malnutrition: Multiple etiology.  Uremia is contributing as well.  Continue oral supplement and management of renal failure as above.  #Hyperphosphatemia: Resolved will discontinue binders.   PTH 332   LOS: [place] @TODAY @7 :47 AM

## 2020-02-09 NOTE — TOC Progression Note (Signed)
Transition of Care [facility]) - Progression Note    Patient Details  Name: Paul Bond MRN: [id number] Date of Birth: 29-Jan-1941  Transition of Care [facility]) CM/SW [place], [place] Phone Number: 02/09/2020, 4:32 PM  Clinical Narrative:     CSW visit with patient- provided bed offers. Patient accepted bed offer with [facility]. [place] confirmed bed offer.   Per patient's request CSW called his spouse and provided update.  CSW will continue to follow and assist with discharge planning.  Thurmond Butts, MSW, [place] Clinical Social Worker   Expected Discharge Plan: Skilled Nursing Facility Barriers to Discharge: Continued Medical Work up, SNF Pending bed offer  Expected Discharge Plan and Services Expected Discharge Plan: [place] In-house Referral: Clinical Social Work     Living arrangements for the past 2 months: Single Family Home                                       Social Determinants of Health (SDOH) Interventions    Readmission Risk Interventions No flowsheet data found.

## 2020-02-10 LAB — RENAL FUNCTION PANEL
Albumin: 2.4 g/dL — ABNORMAL LOW (ref 3.5–5.0)
Anion gap: 13 (ref 5–15)
BUN: 83 mg/dL — ABNORMAL HIGH (ref 8–23)
CO2: 16 mmol/L — ABNORMAL LOW (ref 22–32)
Calcium: 8.3 mg/dL — ABNORMAL LOW (ref 8.9–10.3)
Chloride: 110 mmol/L (ref 98–111)
Creatinine, Ser: 5.56 mg/dL — ABNORMAL HIGH (ref 0.61–1.24)
GFR calc Af Amer: 10 mL/min — ABNORMAL LOW (ref >60)
GFR calc non Af Amer: 9 mL/min — ABNORMAL LOW (ref >60)
Glucose, Bld: 128 mg/dL — ABNORMAL HIGH (ref 70–99)
Phosphorus: 3.6 mg/dL (ref 2.5–4.6)
Potassium: 4.7 mmol/L (ref 3.5–5.1)
Sodium: 139 mmol/L (ref 135–145)

## 2020-02-10 LAB — GLUCOSE, CAPILLARY
Glucose-Capillary: 126 mg/dL — ABNORMAL HIGH (ref 70–99)
Glucose-Capillary: 123 mg/dL — ABNORMAL HIGH (ref 70–99)
Glucose-Capillary: 149 mg/dL — ABNORMAL HIGH (ref 70–99)
Glucose-Capillary: 273 mg/dL — ABNORMAL HIGH (ref 70–99)
Glucose-Capillary: 257 mg/dL — ABNORMAL HIGH (ref 70–99)

## 2020-02-10 MED ORDER — STERILE WATER FOR INJECTION IV SOLN
INTRAVENOUS | Status: DC
  Filled 2020-02-10 (×8): qty 850
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
## 2020-02-10 NOTE — Progress Notes (Signed)
[facility] ROUNDING NOTE   Subjective:   This is a 79 year old male with a history of hypertension diabetes stroke presented with failure to thrive and advanced renal failure with a BUN of 245 and a creatinine of 26 and hyperkalemia.  Renal ultrasound showed a solitary kidney and hydronephrosis.  CT scan of abdomen and pelvis showed right kidney hydronephrosis urology consulted stent placed 02/03/2020 increasing urine output and improved renal function.  Blood pressure 137/54 pulse 93 temperature 98.3 urine output 5 L 02/09/2020  Sodium 139 potassium 4.7 chloride 110 CO2 16 BUN 83 creatinine 5.56 glucose 128 calcium 8.3 albumin 2.4   Objective:  Vital signs in last 24 hours:  Temp:  [98 F (36.7 C)-99.6 F (37.6 C)] 98.6 F (37 C) (09/01 0420) Pulse Rate:  [94-105] 94 (08/31 2350) Resp:  [14-17] 14 (09/01 0420) BP: (130-151)/(53-76) 137/54 (09/01 0420) SpO2:  [98 %-99 %] 98 % (09/01 0420)  Weight change:  Filed Weights   02/03/20 2213 02/05/20 0411 02/07/20 0550  Weight: 66.9 kg 66.5 kg 67.5 kg    Intake/Output: I/O last 3 completed shifts: In: 3356.8 [I.V.:3356.8] Out: 5625 [Urine:5625]   Intake/Output this shift:  Total I/O In: 2268.6 [I.V.:2268.6] Out: 8938 [Urine:1785]  General: Alert awake and comfortable Heart:RRR, s1s2 nl, no rubs Lungs: Clear b/l, no crackle Abdomen:soft, Non-tender, non-distended Extremities: No edema Neurology: Alert awake and following commands. Urology: Has Foley catheter and bag has pinkish urine.   Basic Metabolic Panel: Recent Labs  Lab 02/06/20 0204 02/06/20 0204 02/06/20 0848 02/07/20 0255 02/07/20 0255 02/08/20 0202 02/09/20 0144 02/10/20 0318  NA 150*  --   --  139  --  142 141 139  K 3.0*  --   --  3.6  --  3.9 4.5 4.7  CL 107  --   --  105  --  109 109 110  CO2 23  --   --  23  --  19* 20* 16*  GLUCOSE 126*  --   --  179*  --  113* 163* 128*  BUN 142*  --   --  115*  --  101* 90* 83*  CREATININE  12.91*  --   --  9.40*  --  7.81* 6.50* 5.56*  CALCIUM 7.8*   < >  --  7.6*   < > 8.1* 8.3* 8.3*  MG  --   --  2.3  --   --   --   --   --   PHOS 7.6*  --   --  5.2*  --  4.2 4.0 3.6   < > = values in this interval not displayed.    Liver Function Tests: Recent Labs  Lab 02/06/20 0204 02/07/20 0255 02/08/20 0202 02/09/20 0144 02/10/20 0318  ALBUMIN 2.6* 2.4* 2.4* 2.4* 2.4*   No results for input(s): LIPASE, AMYLASE in the last 168 hours. No results for input(s): AMMONIA in the last 168 hours.  CBC: Recent Labs  Lab 02/03/20 0644 02/03/20 1950  WBC 12.7*  --   HGB 11.0* 10.5*  HCT 35.7* 31.0*  MCV 94.9  --   PLT 149*  --     Cardiac Enzymes: Recent Labs  Lab 02/03/20 1130  CKTOTAL 151    BNP: Invalid input(s): POCBNP  CBG: Recent Labs  Lab 02/09/20 1116 02/09/20 1610 02/09/20 2027 02/09/20 2353 02/10/20 0421  GLUCAP 191* 259* 293* 166* 126*    Microbiology: Results for orders placed or performed during the hospital encounter of  02/02/20  Urine culture     Status: None   Collection Time: 02/02/20 10:05 PM   Specimen: Urine, Random  Result Value Ref Range Status   Specimen Description URINE, RANDOM  Final   Special Requests NONE  Final   Culture   Final    NO GROWTH Performed at [facility], [address], [place], [place] [address]    Report Status 02/04/2020 FINAL  Final  SARS Coronavirus 2 by RT PCR (hospital order, performed in [facility] hospital lab) Nasopharyngeal Nasopharyngeal Swab     Status: None   Collection Time: 02/02/20 10:07 PM   Specimen: Nasopharyngeal Swab  Result Value Ref Range Status   SARS Coronavirus 2 NEGATIVE NEGATIVE Final    Comment: (NOTE) SARS-CoV-2 target nucleic acids are NOT DETECTED.  The SARS-CoV-2 RNA is generally detectable in upper and lower respiratory specimens during the acute phase of infection. The lowest concentration of SARS-CoV-2 viral copies this assay can detect is 250 copies / mL. A  negative result does not preclude SARS-CoV-2 infection and should not be used as the sole basis for treatment or other patient management decisions.  A negative result may occur with improper specimen collection / handling, submission of specimen other than nasopharyngeal swab, presence of viral mutation(s) within the areas targeted by this assay, and inadequate number of viral copies (<250 copies / mL). A negative result must be combined with clinical observations, patient history, and epidemiological information.  Fact Sheet for Patients:   StrictlyIdeas.no  Fact Sheet for Healthcare Providers: BankingDealers.co.za  This test is not yet approved or  cleared by the [place] FDA and has been authorized for detection and/or diagnosis of SARS-CoV-2 by FDA under an Emergency Use Authorization (EUA).  This EUA will remain in effect (meaning this test can be used) for the duration of the COVID-19 declaration under Section 564(b)(1) of the Act, 21 U.S.C. section 360bbb-3(b)(1), unless the authorization is terminated or revoked sooner.  Performed at [facility], [place] [address], [place], [place] [address]     Coagulation Studies: No results for input(s): LABPROT, INR in the last 72 hours.  Urinalysis: No results for input(s): COLORURINE, LABSPEC, PHURINE, GLUCOSEU, HGBUR, BILIRUBINUR, KETONESUR, PROTEINUR, UROBILINOGEN, NITRITE, LEUKOCYTESUR in the last 72 hours.  Invalid input(s): APPERANCEUR    Imaging: No results found.   Medications:   . sodium chloride 100 mL/hr at 02/09/20 0706   . (feeding supplement) PROSource Plus  30 mL Oral BID BM  . Chlorhexidine Gluconate Cloth  6 each Topical Daily  . diltiazem  30 mg Oral Q6H  . feeding supplement (ENSURE ENLIVE)  237 mL Oral BID BM  . heparin injection (subcutaneous)  5,000 Units Subcutaneous Q8H  . insulin aspart  0-6 Units Subcutaneous Q4H  . multivitamin with  minerals  1 tablet Oral Daily  . 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  CT abdomen pelvis with severely atrophic left kidney and right kidney has moderate hydroureteronephrosis. Urology was consulted and had stent placement on 8/25. Increasing urine output and creatinine level continue to improve.     We will change IV fluids to D5W with 3 A of bicarb at 100 cc an hour  #Solitary kidney with right hydronephrosis: Seen by urology and underwent cystoscopy and placement of  double J stent.  #Hyperkalemia due to AKI on losartan.  Hyperkalemia has improved.  #Hypernatremia, hypovolemic: Improved.   #Anion gap metabolic acidosis: We will change to IV sodium bicarbonate due to worsening acidosis probably related to his renal insufficiency  #Hypertension: Hypertension appears well-controlled   #Failure to thrive/weight loss, severe protein calorie malnutrition: Multiple etiology.  Uremia is contributing as well.  Continue oral supplement and management of renal failure as above.  #Hyperphosphatemia: Resolved will discontinue binders.   PTH 332   LOS: 8 Sherril Croon @TODAY @6 :15 AM

## 2020-02-10 NOTE — Progress Notes (Signed)
Physical Therapy Treatment Patient Details Name: Paul Bond MRN: [id number] DOB: 1940/07/05 Today's Date: 02/10/2020    History of Present Illness Paul Bond is a 79 y.o. male with medical history significant of history of cancer, diabetes, hypertension, stroke presenting to the ED via EMS from home for evaluation of failure to thrive.  Patient was hypotensive with EMS and given 800 cc normal saline in route. patient is normally quite physically active and cuts his own grass/does yard work.  He normally takes care of his wife as well.  For the past 3 weeks leading up to admission he has not been eating and has been bedbound; Principal problem -- Acute Kidney Injury    PT Comments    Pt progressing towards acute PT goals, ambulating room distance with RW with light assist to steady this day. Pt tolerated repeated sit to stands for LE strengthening this session, requires multimodal cuing for form and safety. Pt with stool incontinence this session, with gelatinous appearance, and required pericare assist. PT to continue to follow acutely.     Follow Up Recommendations  SNF;Supervision for mobility/OOB     Equipment Recommendations  Rolling walker with 5" wheels;3in1 (PT)    Recommendations for Other Services       Precautions / Restrictions Precautions Precautions: Fall Restrictions Weight Bearing Restrictions: No    Mobility  Bed Mobility Overal bed mobility: Needs Assistance Bed Mobility: Supine to Sit     Supine to sit: HOB elevated;Min assist     General bed mobility comments: Min assist for trunk elevation, scooting to EOB. Increased time and effort, pt requesting PT HHA.  Transfers Overall transfer level: Needs assistance Equipment used: Rolling walker (2 wheeled) Transfers: Sit to/from Stand Sit to Stand: Min assist;From elevated surface         General transfer comment: Min assist for power up, steadying upon standing .Verbal cuing for hand placement when  rising/sitting.  Ambulation/Gait Ambulation/Gait assistance: Min assist;+2 safety/equipment Gait Distance (Feet): 15 Feet Assistive device: Rolling walker (2 wheeled) Gait Pattern/deviations: Step-through pattern;Decreased stride length;Trunk flexed Gait velocity: decr   General Gait Details: Min assist to steady, guide RW, +2 for chair follow.   Stairs             Wheelchair Mobility    Modified Rankin (Stroke Patients Only)       Balance Overall balance assessment: Needs assistance Sitting-balance support: Feet supported;No upper extremity supported Sitting balance-[name] Scale: Fair     Standing balance support: During functional activity Standing balance-[name] Scale: Poor Standing balance comment: Requires UE support and external support                            Cognition Arousal/Alertness: Awake/alert Behavior During Therapy: WFL for tasks assessed/performed Overall Cognitive Status: No family/caregiver present to determine baseline cognitive functioning Area of Impairment: Attention;Problem solving;Following commands;Safety/judgement                   Current Attention Level: Sustained   Following Commands: Follows one step commands with increased time Safety/Judgement: Decreased awareness of deficits;Decreased awareness of safety   Problem Solving: Slow processing;Decreased initiation;Requires verbal cues General Comments: Increased time to process verbal cues, stool incontinence      Exercises General Exercises - Lower Extremity Mini-Sqauts: AROM;Both;Seated;Standing (x3 sit to stands for LE strengthening, with emphasis on slow eccentric lower.)    General Comments General comments (skin integrity, edema, etc.): HRmax 131 bpm  Pertinent Vitals/Pain Pain Assessment: No/denies pain    Home Living                      Prior Function            PT Goals (current goals can now be found in the care plan section)  Acute Rehab PT Goals Patient Stated Goal: get better soon PT Goal Formulation: With patient Time For Goal Achievement: 02/21/20 Potential to Achieve Goals: Good Progress towards PT goals: Progressing toward goals    Frequency    Min 3X/week      PT Plan Current plan remains appropriate    Co-evaluation              AM-PAC PT "6 Clicks" Mobility   Outcome Measure  Help needed turning from your back to your side while in a flat bed without using bedrails?: A Pinela Help needed moving from lying on your back to sitting on the side of a flat bed without using bedrails?: A Tahir Help needed moving to and from a bed to a chair (including a wheelchair)?: A Langseth Help needed standing up from a chair using your arms (e.g., wheelchair or bedside chair)?: A Chay Help needed to walk in hospital room?: A Pfenning Help needed climbing 3-5 steps with a railing? : A Lot 6 Click Score: 17    End of Session Equipment Utilized During Treatment: Gait belt Activity Tolerance: Patient limited by fatigue Patient left: in chair;with call bell/phone within reach;with chair alarm set Nurse Communication: Mobility status PT Visit Diagnosis: Unsteadiness on feet (R26.81);Other abnormalities of gait and mobility (R26.89);Muscle weakness (generalized) (M62.81)     Time: 3532-9924 PT Time Calculation (min) (ACUTE ONLY): 22 min  Charges:  $Therapeutic Activity: 8-22 mins                     Briah Nary E, PT Acute Rehabilitation Services Pager [phone]  Office [phone]  Reed Dady D Raiden Haydu 02/10/2020, 4:08 PM

## 2020-02-10 NOTE — Progress Notes (Signed)
Nutrition Follow-up  DOCUMENTATION CODES:   Severe malnutrition in context of acute illness/injury  INTERVENTION:    Ensure Enlive po BID, each supplement provides 350 kcal and 20 grams of protein  30 ml ProSource Plus BID, each supplement provides 100 kcals and 15 grams protein.   MVI daily   NUTRITION DIAGNOSIS:   Severe Malnutrition related to acute illness (obstructive uropathy) as evidenced by moderate muscle depletion, severe muscle depletion.  Ongoing  GOAL:   Patient will meet greater than or equal to 90% of their needs  Progressing   MONITOR:   PO intake, Supplement acceptance, Weight trends, Labs, I & O's  REASON FOR ASSESSMENT:   Consult Assessment of nutrition requirement/status  ASSESSMENT:   Patient with PMH significant for DM, HTN, cancer (unknown type), and stroke. Presents this admission with failure to thrive and AKI.   8/25- cystoscopy, stent placement   Pt endorses appetite slow to progress. Last three meal completions charted as 25%. Taking Ensure and enjoys them. PTA pt reports poor appetite x 3 weeks. Unable to elaborate on daily meal composition.   Admission weight: 66.9 kg  Current weight: 67.5 kg   I/O: -8,168 ml since admit UOP: 3,835 ml x 24 hrs   Drips: 150 mEq sodium bicarb @ 100 ml/hr  Medications: SS novolog, MVI with minerals, 40 mEq KCl daily Labs: Cr 5.56-trending down   NUTRITION - FOCUSED PHYSICAL EXAM:    Most Recent Value  Orbital Region Mild depletion  Upper Arm Region Moderate depletion  Thoracic and Lumbar Region Unable to assess  Buccal Region Moderate depletion  Temple Region Moderate depletion  Clavicle Bone Region Moderate depletion  Clavicle and Acromion Bone Region Moderate depletion  Scapular Bone Region Unable to assess  Dorsal Hand Mild depletion  Patellar Region Severe depletion  Anterior Thigh Region Severe depletion  Posterior Calf Region Severe depletion  Edema (RD Assessment) Mild  Hair  Reviewed  Eyes Reviewed  Mouth Reviewed  Skin Reviewed  Nails Reviewed      Diet Order:   Diet Order            Diet renal/carb modified with fluid restriction Diet-HS Snack? Nothing; Fluid restriction: 1200 mL Fluid; Room service appropriate? No; Fluid consistency: Thin  Diet effective now                 EDUCATION NEEDS:   Education needs have been addressed  Skin:  Skin Assessment: Reviewed RN Assessment  Last BM:  8/31  Height:   Ht Readings from Last 1 Encounters:  02/07/20 5\' 7"  (1.702 m)    Weight:   Wt Readings from Last 1 Encounters:  02/07/20 67.5 kg    BMI:  Body mass index is 23.31 kg/m.  Estimated Nutritional Needs:   Kcal:  2150-2350 kcal  Protein:  110-125 grams  Fluid:  >/= 2.1 L/day   Paul Bond RD, LDN Clinical Nutrition Pager listed in [facility]

## 2020-02-11 DIAGNOSIS — E43 Unspecified severe protein-calorie malnutrition: Secondary | ICD-10-CM | POA: Insufficient documentation

## 2020-02-11 LAB — CBC WITH DIFFERENTIAL/PLATELET
Abs Immature Granulocytes: 0.8 10*3/uL — ABNORMAL HIGH (ref 0.00–0.07)
Basophils Absolute: 0 10*3/uL (ref 0.0–0.1)
Basophils Relative: 0 %
Eosinophils Absolute: 0.2 10*3/uL (ref 0.0–0.5)
Eosinophils Relative: 2 %
HCT: 23.2 % — ABNORMAL LOW (ref 39.0–52.0)
Hemoglobin: 7.4 g/dL — ABNORMAL LOW (ref 13.0–17.0)
Immature Granulocytes: 7 %
Lymphocytes Relative: 12 %
Lymphs Abs: 1.5 10*3/uL (ref 0.7–4.0)
MCH: 29.4 pg (ref 26.0–34.0)
MCHC: 31.9 g/dL (ref 30.0–36.0)
MCV: 92.1 fL (ref 80.0–100.0)
Monocytes Absolute: 1.6 10*3/uL — ABNORMAL HIGH (ref 0.1–1.0)
Monocytes Relative: 13 %
Neutro Abs: 8.1 10*3/uL — ABNORMAL HIGH (ref 1.7–7.7)
Neutrophils Relative %: 66 %
Platelets: 240 10*3/uL (ref 150–400)
RBC: 2.52 MIL/uL — ABNORMAL LOW (ref 4.22–5.81)
RDW: 15.7 % — ABNORMAL HIGH (ref 11.5–15.5)
WBC: 12.2 10*3/uL — ABNORMAL HIGH (ref 4.0–10.5)
nRBC: 0 % (ref 0.0–0.2)

## 2020-02-11 LAB — RENAL FUNCTION PANEL
Albumin: 2.3 g/dL — ABNORMAL LOW (ref 3.5–5.0)
Anion gap: 10 (ref 5–15)
BUN: 85 mg/dL — ABNORMAL HIGH (ref 8–23)
CO2: 22 mmol/L (ref 22–32)
Calcium: 8.4 mg/dL — ABNORMAL LOW (ref 8.9–10.3)
Chloride: 101 mmol/L (ref 98–111)
Creatinine, Ser: 5.04 mg/dL — ABNORMAL HIGH (ref 0.61–1.24)
GFR calc Af Amer: 12 mL/min — ABNORMAL LOW (ref >60)
GFR calc non Af Amer: 10 mL/min — ABNORMAL LOW (ref >60)
Glucose, Bld: 139 mg/dL — ABNORMAL HIGH (ref 70–99)
Phosphorus: 3.5 mg/dL (ref 2.5–4.6)
Potassium: 4.6 mmol/L (ref 3.5–5.1)
Sodium: 133 mmol/L — ABNORMAL LOW (ref 135–145)

## 2020-02-11 LAB — GLUCOSE, CAPILLARY
Glucose-Capillary: 124 mg/dL — ABNORMAL HIGH (ref 70–99)
Glucose-Capillary: 138 mg/dL — ABNORMAL HIGH (ref 70–99)
Glucose-Capillary: 164 mg/dL — ABNORMAL HIGH (ref 70–99)
Glucose-Capillary: 239 mg/dL — ABNORMAL HIGH (ref 70–99)
Glucose-Capillary: 259 mg/dL — ABNORMAL HIGH (ref 70–99)
Glucose-Capillary: 313 mg/dL — ABNORMAL HIGH (ref 70–99)

## 2020-02-11 LAB — BASIC METABOLIC PANEL
Anion gap: 10 (ref 5–15)
BUN: 82 mg/dL — ABNORMAL HIGH (ref 8–23)
CO2: 22 mmol/L (ref 22–32)
Calcium: 8.4 mg/dL — ABNORMAL LOW (ref 8.9–10.3)
Chloride: 104 mmol/L (ref 98–111)
Creatinine, Ser: 5.13 mg/dL — ABNORMAL HIGH (ref 0.61–1.24)
GFR calc Af Amer: 11 mL/min — ABNORMAL LOW (ref >60)
GFR calc non Af Amer: 10 mL/min — ABNORMAL LOW (ref >60)
Glucose, Bld: 141 mg/dL — ABNORMAL HIGH (ref 70–99)
Potassium: 4.7 mmol/L (ref 3.5–5.1)
Sodium: 136 mmol/L (ref 135–145)

## 2020-02-11 MED ORDER — LOPERAMIDE HCL 2 MG PO CAPS
2.0000 mg | ORAL_CAPSULE | Freq: Four times a day (QID) | ORAL | Status: DC | PRN
  Administered 2020-02-11 – 2020-02-12 (×2): 2 mg via ORAL
  Filled 2020-02-11 (×2): qty 1

## 2020-02-11 MED ORDER — TAMSULOSIN HCL 0.4 MG PO CAPS
0.4000 mg | ORAL_CAPSULE | Freq: Every day | ORAL | Status: DC
  Administered 2020-02-11 – 2020-02-21 (×11): 0.4 mg via ORAL
  Filled 2020-02-11 (×11): qty 1

## 2020-02-11 MED ORDER — INSULIN GLARGINE 100 UNIT/ML ~~LOC~~ SOLN
5.0000 [IU] | Freq: Every day | SUBCUTANEOUS | Status: DC
  Administered 2020-02-11 – 2020-02-12 (×2): 5 [IU] via SUBCUTANEOUS
  Filled 2020-02-11 (×2): qty 0.05

## 2020-02-11 NOTE — Progress Notes (Signed)
[facility] ROUNDING NOTE   Subjective:   This is a 79 year old male with a history of hypertension diabetes stroke presented with failure to thrive and advanced renal failure with a BUN of 245 and a creatinine of 26 and hyperkalemia.  Renal ultrasound showed a solitary kidney and hydronephrosis.  CT scan of abdomen and pelvis showed right kidney hydronephrosis urology consulted stent placed 02/03/2020 increasing urine output and improved renal function.  Blood pressure 125/68 pulse 98 temperature 99.2 O2 sats 98% room air urine output 2.8 L 02/10/2020  Sodium 133 potassium 4.6 chloride 102 CO2 22 BUN 85 creatinine 5.04 glucose 139 calcium 8.4 phosphorus 3.5 albumin 2.3   Objective:  Vital signs in last 24 hours:  Temp:  [98.4 F (36.9 C)-99.2 F (37.3 C)] 99.2 F (37.3 C) (09/02 0425) Pulse Rate:  [92-100] 98 (09/02 0425) Resp:  [14-20] 16 (09/02 0425) BP: (121-148)/(60-78) 125/68 (09/02 0425) SpO2:  [98 %-100 %] 98 % (09/02 0425)  Weight change:  Filed Weights   02/03/20 2213 02/05/20 0411 02/07/20 0550  Weight: 66.9 kg 66.5 kg 67.5 kg    Intake/Output: I/O last 3 completed shifts: In: 2268.6 [I.V.:2268.6] Out: 1062 [Urine:5385]   Intake/Output this shift:  No intake/output data recorded.  General: Alert awake and comfortable Heart:RRR, s1s2 nl, no rubs Lungs: Clear b/l, no crackle Abdomen:soft, Non-tender, non-distended Extremities: No edema Neurology: Alert awake and following commands. Urology: Has Foley catheter and bag has pinkish urine.   Basic Metabolic Panel: Recent Labs  Lab 02/06/20 0204 02/06/20 0848 02/07/20 0255 02/07/20 0255 02/08/20 0202 02/08/20 0202 02/09/20 0144 02/10/20 0318 02/11/20 0254  NA   < >  --  139  --  142  --  141 139 136  133*  K   < >  --  3.6  --  3.9  --  4.5 4.7 4.7  4.6  CL   < >  --  105  --  109  --  109 110 104  101  CO2   < >  --  23  --  19*  --  20* 16* 22  22  GLUCOSE   < >  --  179*  --  113*   --  163* 128* 141*  139*  BUN   < >  --  115*  --  101*  --  90* 83* 82*  85*  CREATININE   < >  --  9.40*  --  7.81*  --  6.50* 5.56* 5.13*  5.04*  CALCIUM   < >  --  7.6*   < > 8.1*   < > 8.3* 8.3* 8.4*  8.4*  MG  --  2.3  --   --   --   --   --   --   --   PHOS   < >  --  5.2*  --  4.2  --  4.0 3.6 3.5   < > = values in this interval not displayed.    Liver Function Tests: Recent Labs  Lab 02/07/20 0255 02/08/20 0202 02/09/20 0144 02/10/20 0318 02/11/20 0254  ALBUMIN 2.4* 2.4* 2.4* 2.4* 2.3*   No results for input(s): LIPASE, AMYLASE in the last 168 hours. No results for input(s): AMMONIA in the last 168 hours.  CBC: Recent Labs  Lab 02/11/20 0254  WBC 12.2*  NEUTROABS 8.1*  HGB 7.4*  HCT 23.2*  MCV 92.1  PLT 240    Cardiac Enzymes: No results for input(s):  CKTOTAL, CKMB, CKMBINDEX, TROPONINI in the last 168 hours.  BNP: Invalid input(s): POCBNP  CBG: Recent Labs  Lab 02/10/20 1129 02/10/20 1637 02/10/20 2016 02/11/20 0014 02/11/20 0422  GLUCAP 149* 273* 257* 164* 138*    Microbiology: Results for orders placed or performed during the hospital encounter of 02/02/20  Urine culture     Status: None   Collection Time: 02/02/20 10:05 PM   Specimen: Urine, Random  Result Value Ref Range Status   Specimen Description URINE, RANDOM  Final   Special Requests NONE  Final   Culture   Final    NO GROWTH Performed at [facility], [place] [address], [place], [place] [address]    Report Status 02/04/2020 FINAL  Final  SARS Coronavirus 2 by RT PCR (hospital order, performed in [facility] hospital lab) Nasopharyngeal Nasopharyngeal Swab     Status: None   Collection Time: 02/02/20 10:07 PM   Specimen: Nasopharyngeal Swab  Result Value Ref Range Status   SARS Coronavirus 2 NEGATIVE NEGATIVE Final    Comment: (NOTE) SARS-CoV-2 target nucleic acids are NOT DETECTED.  The SARS-CoV-2 RNA is generally detectable in upper and lower respiratory specimens  during the acute phase of infection. The lowest concentration of SARS-CoV-2 viral copies this assay can detect is 250 copies / mL. A negative result does not preclude SARS-CoV-2 infection and should not be used as the sole basis for treatment or other patient management decisions.  A negative result may occur with improper specimen collection / handling, submission of specimen other than nasopharyngeal swab, presence of viral mutation(s) within the areas targeted by this assay, and inadequate number of viral copies (<250 copies / mL). A negative result must be combined with clinical observations, patient history, and epidemiological information.  Fact Sheet for Patients:   StrictlyIdeas.no  Fact Sheet for Healthcare Providers: BankingDealers.co.za  This test is not yet approved or  cleared by the [place] FDA and has been authorized for detection and/or diagnosis of SARS-CoV-2 by FDA under an Emergency Use Authorization (EUA).  This EUA will remain in effect (meaning this test can be used) for the duration of the COVID-19 declaration under Section 564(b)(1) of the Act, 21 U.S.C. section 360bbb-3(b)(1), unless the authorization is terminated or revoked sooner.  Performed at [facility], [place] [address], [place], [place] [address]     Coagulation Studies: No results for input(s): LABPROT, INR in the last 72 hours.  Urinalysis: No results for input(s): COLORURINE, LABSPEC, PHURINE, GLUCOSEU, HGBUR, BILIRUBINUR, KETONESUR, PROTEINUR, UROBILINOGEN, NITRITE, LEUKOCYTESUR in the last 72 hours.  Invalid input(s): APPERANCEUR    Imaging: No results found.   Medications:   .  sodium bicarbonate (isotonic) infusion in sterile water 100 mL/hr at 02/11/20 0016   . (feeding supplement) PROSource Plus  30 mL Oral BID BM  . Chlorhexidine Gluconate Cloth  6 each Topical Daily  . diltiazem  30 mg Oral Q6H  . feeding supplement  (ENSURE ENLIVE)  237 mL Oral BID BM  . heparin injection (subcutaneous)  5,000 Units Subcutaneous Q8H  . insulin aspart  0-6 Units Subcutaneous Q4H  . multivitamin with minerals  1 tablet Oral Daily  . 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  CT abdomen pelvis with severely atrophic left kidney and right  kidney has moderate hydroureteronephrosis. Urology was consulted and had stent placement on 8/25. Increasing urine output and creatinine level continue to improve..       D5W with 3 A of bicarb at 100 cc an hour.  Will decrease rate to 50 cc an hour  #Solitary kidney with right hydronephrosis: Seen by urology and underwent cystoscopy and placement of double J stent.  #Hyperkalemia due to AKI on losartan.  Hyperkalemia has improved.  #Hypernatremia, hypovolemic: Improved.   #Anion gap metabolic acidosis: Continue sodium bicarbonate at D5W 3 A at 50 cc an hour  #Hypertension: Hypertension appears well-controlled   #Failure to thrive/weight loss, severe protein calorie malnutrition: Multiple etiology.  Uremia is contributing as well.  Continue oral supplement and management of renal failure as above.  #Hyperphosphatemia: Resolved will discontinue binders.   PTH 332   LOS: 9 Sherril Croon @TODAY @7 :47 AM

## 2020-02-11 NOTE — Progress Notes (Signed)
Patient's creatinine decreasing.  Foley still in place.  I think it is fine to start a voiding trial.  I will order this for tomorrow.  He did have obstructive changes in his bladder on cystoscopy so I will put him on tamsulosin.  He will need outpatient follow-up with eventual ureteroscopy to evaluate his right ureteral obstruction.   LOS: 9 days   Jorja Loa 02/11/2020, 1:45 PM
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
## 2020-02-12 ENCOUNTER — Inpatient Hospital Stay (HOSPITAL_COMMUNITY): Payer: Medicare Other

## 2020-02-12 LAB — BASIC METABOLIC PANEL
Anion gap: 18 — ABNORMAL HIGH (ref 5–15)
BUN: 92 mg/dL — ABNORMAL HIGH (ref 8–23)
CO2: 21 mmol/L — ABNORMAL LOW (ref 22–32)
Calcium: 8.9 mg/dL (ref 8.9–10.3)
Chloride: 98 mmol/L (ref 98–111)
Creatinine, Ser: 6.05 mg/dL — ABNORMAL HIGH (ref 0.61–1.24)
GFR calc Af Amer: 9 mL/min — ABNORMAL LOW (ref >60)
GFR calc non Af Amer: 8 mL/min — ABNORMAL LOW (ref >60)
Glucose, Bld: 193 mg/dL — ABNORMAL HIGH (ref 70–99)
Potassium: 6.7 mmol/L (ref 3.5–5.1)
Sodium: 137 mmol/L (ref 135–145)

## 2020-02-12 LAB — CBC WITH DIFFERENTIAL/PLATELET
Abs Immature Granulocytes: 0.52 10*3/uL — ABNORMAL HIGH (ref 0.00–0.07)
Basophils Absolute: 0 10*3/uL (ref 0.0–0.1)
Basophils Relative: 0 %
Eosinophils Absolute: 0.2 10*3/uL (ref 0.0–0.5)
Eosinophils Relative: 2 %
HCT: 23.8 % — ABNORMAL LOW (ref 39.0–52.0)
Hemoglobin: 7.6 g/dL — ABNORMAL LOW (ref 13.0–17.0)
Immature Granulocytes: 5 %
Lymphocytes Relative: 15 %
Lymphs Abs: 1.6 10*3/uL (ref 0.7–4.0)
MCH: 29.3 pg (ref 26.0–34.0)
MCHC: 31.9 g/dL (ref 30.0–36.0)
MCV: 91.9 fL (ref 80.0–100.0)
Monocytes Absolute: 1.3 10*3/uL — ABNORMAL HIGH (ref 0.1–1.0)
Monocytes Relative: 12 %
Neutro Abs: 6.8 10*3/uL (ref 1.7–7.7)
Neutrophils Relative %: 66 %
Platelets: 276 10*3/uL (ref 150–400)
RBC: 2.59 MIL/uL — ABNORMAL LOW (ref 4.22–5.81)
RDW: 15.5 % (ref 11.5–15.5)
WBC: 10.3 10*3/uL (ref 4.0–10.5)
nRBC: 0 % (ref 0.0–0.2)

## 2020-02-12 LAB — RENAL FUNCTION PANEL
Albumin: 2.3 g/dL — ABNORMAL LOW (ref 3.5–5.0)
Anion gap: 12 (ref 5–15)
BUN: 77 mg/dL — ABNORMAL HIGH (ref 8–23)
CO2: 24 mmol/L (ref 22–32)
Calcium: 8.4 mg/dL — ABNORMAL LOW (ref 8.9–10.3)
Chloride: 102 mmol/L (ref 98–111)
Creatinine, Ser: 5.26 mg/dL — ABNORMAL HIGH (ref 0.61–1.24)
GFR calc Af Amer: 11 mL/min — ABNORMAL LOW (ref >60)
GFR calc non Af Amer: 10 mL/min — ABNORMAL LOW (ref >60)
Glucose, Bld: 167 mg/dL — ABNORMAL HIGH (ref 70–99)
Phosphorus: 3.7 mg/dL (ref 2.5–4.6)
Potassium: 5.6 mmol/L — ABNORMAL HIGH (ref 3.5–5.1)
Sodium: 138 mmol/L (ref 135–145)

## 2020-02-12 LAB — GLUCOSE, CAPILLARY
Glucose-Capillary: 128 mg/dL — ABNORMAL HIGH (ref 70–99)
Glucose-Capillary: 158 mg/dL — ABNORMAL HIGH (ref 70–99)
Glucose-Capillary: 192 mg/dL — ABNORMAL HIGH (ref 70–99)
Glucose-Capillary: 199 mg/dL — ABNORMAL HIGH (ref 70–99)
Glucose-Capillary: 200 mg/dL — ABNORMAL HIGH (ref 70–99)
Glucose-Capillary: 236 mg/dL — ABNORMAL HIGH (ref 70–99)

## 2020-02-12 LAB — NA AND K (SODIUM & POTASSIUM), RAND UR
Potassium Urine: 15 mmol/L
Sodium, Ur: 86 mmol/L

## 2020-02-12 LAB — CREATININE, URINE, RANDOM: Creatinine, Urine: 36.66 mg/dL

## 2020-02-12 LAB — OSMOLALITY, URINE: Osmolality, Ur: 326 mOsm/kg (ref 300–900)

## 2020-02-12 LAB — POTASSIUM: Potassium: 6.7 mmol/L (ref 3.5–5.1)

## 2020-02-12 MED ORDER — INSULIN ASPART 100 UNIT/ML IV SOLN
10.0000 [IU] | Freq: Once | INTRAVENOUS | Status: AC
  Administered 2020-02-12: 10 [IU] via INTRAVENOUS

## 2020-02-12 MED ORDER — INSULIN GLARGINE 100 UNIT/ML ~~LOC~~ SOLN
10.0000 [IU] | Freq: Every day | SUBCUTANEOUS | Status: DC
  Administered 2020-02-13 – 2020-02-19 (×7): 10 [IU] via SUBCUTANEOUS
  Filled 2020-02-12 (×7): qty 0.1

## 2020-02-12 MED ORDER — DEXTROSE 50 % IV SOLN
1.0000 | Freq: Once | INTRAVENOUS | Status: AC
  Administered 2020-02-12: 50 mL via INTRAVENOUS
  Filled 2020-02-12: qty 50

## 2020-02-12 MED ORDER — DILTIAZEM HCL 60 MG PO TABS
60.0000 mg | ORAL_TABLET | Freq: Three times a day (TID) | ORAL | Status: DC
  Administered 2020-02-12 – 2020-02-21 (×27): 60 mg via ORAL
  Filled 2020-02-12 (×27): qty 1

## 2020-02-12 MED ORDER — DILTIAZEM HCL 60 MG PO TABS: 60.0000 mg | ORAL_TABLET | Freq: Four times a day (QID) | ORAL | Status: DC

## 2020-02-12 MED ORDER — CALCIUM GLUCONATE-NACL 1-0.675 GM/50ML-% IV SOLN
1.0000 g | Freq: Once | INTRAVENOUS | Status: AC
  Administered 2020-02-12: 1000 mg via INTRAVENOUS
  Filled 2020-02-12 (×2): qty 50

## 2020-02-12 MED ORDER — LOPERAMIDE HCL 2 MG PO CAPS: 2.0000 mg | ORAL_CAPSULE | Freq: Three times a day (TID) | ORAL | Status: DC

## 2020-02-12 MED ORDER — SODIUM CHLORIDE 0.9 % IV BOLUS
500.0000 mL | Freq: Once | INTRAVENOUS | Status: AC
  Administered 2020-02-12: 500 mL via INTRAVENOUS

## 2020-02-12 MED ORDER — LIDOCAINE HCL URETHRAL/MUCOSAL 2 % EX GEL
1.0000 "application " | Freq: Once | CUTANEOUS | Status: AC
  Administered 2020-02-12: 1 via URETHRAL
  Filled 2020-02-12: qty 11

## 2020-02-12 MED ORDER — SODIUM ZIRCONIUM CYCLOSILICATE 10 G PO PACK
10.0000 g | PACK | Freq: Once | ORAL | Status: DC
  Filled 2020-02-12: qty 1
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
## 2020-02-12 NOTE — Progress Notes (Signed)
Secure with MD concerning sustained HR in 120's and no urine output after foley removal this morning.  Bladder scan shows 135 ml. MD placed orders to increase diltiazem and sodium bicarb. Patient made aware.  Explained that potassium level was elevating again and we would continue to monitor. Pt resting with call bell within reach.  Will continue to monitor.

## 2020-02-12 NOTE — Progress Notes (Signed)
[facility] ROUNDING NOTE   Subjective:   This is a 79 year old male with a history of hypertension diabetes stroke presented with failure to thrive and advanced renal failure with a BUN of 245 and a creatinine of 26 and hyperkalemia.  Renal ultrasound showed a solitary kidney and hydronephrosis.  CT scan of abdomen and pelvis showed right kidney hydronephrosis urology consulted stent placed 02/03/2020 increasing urine output and improved renal function.  Blood pressure 112/57 pulse 90 temperature 98.2 O2 sats 96% room air urine output 3.8 L 02/11/2020  Labs pending 02/12/2020   Objective:  Vital signs in last 24 hours:  Temp:  [98.2 F (36.8 C)-100.1 F (37.8 C)] 98.2 F (36.8 C) (09/03 0424) Pulse Rate:  [89-111] 90 (09/03 0424) Resp:  [13-18] 13 (09/03 0424) BP: (112-143)/(57-68) 112/57 (09/03 0424) SpO2:  [97 %-100 %] 100 % (09/03 0424)  Weight change:  Filed Weights   02/03/20 2213 02/05/20 0411 02/07/20 0550  Weight: 66.9 kg 66.5 kg 67.5 kg    Intake/Output: I/O last 3 completed shifts: In: 240 [P.O.:240] Out: 5009 [Urine:4925]   Intake/Output this shift:  No intake/output data recorded.  General: Alert awake and comfortable Heart:RRR, s1s2 nl, no rubs Lungs: Clear b/l, no crackle Abdomen:soft, Non-tender, non-distended Extremities: No edema Neurology: Alert awake and following commands. Urology: Has Foley catheter and bag has pinkish urine.   Basic Metabolic Panel: Recent Labs  Lab 02/06/20 0204 02/06/20 0848 02/07/20 0255 02/07/20 0255 02/08/20 0202 02/08/20 0202 02/09/20 0144 02/10/20 0318 02/11/20 0254  NA   < >  --  139  --  142  --  141 139 136  133*  K   < >  --  3.6  --  3.9  --  4.5 4.7 4.7  4.6  CL   < >  --  105  --  109  --  109 110 104  101  CO2   < >  --  23  --  19*  --  20* 16* 22  22  GLUCOSE   < >  --  179*  --  113*  --  163* 128* 141*  139*  BUN   < >  --  115*  --  101*  --  90* 83* 82*  85*  CREATININE   < >   --  9.40*  --  7.81*  --  6.50* 5.56* 5.13*  5.04*  CALCIUM   < >  --  7.6*   < > 8.1*   < > 8.3* 8.3* 8.4*  8.4*  MG  --  2.3  --   --   --   --   --   --   --   PHOS   < >  --  5.2*  --  4.2  --  4.0 3.6 3.5   < > = values in this interval not displayed.    Liver Function Tests: Recent Labs  Lab 02/07/20 0255 02/08/20 0202 02/09/20 0144 02/10/20 0318 02/11/20 0254  ALBUMIN 2.4* 2.4* 2.4* 2.4* 2.3*   No results for input(s): LIPASE, AMYLASE in the last 168 hours. No results for input(s): AMMONIA in the last 168 hours.  CBC: Recent Labs  Lab 02/11/20 0254  WBC 12.2*  NEUTROABS 8.1*  HGB 7.4*  HCT 23.2*  MCV 92.1  PLT 240    Cardiac Enzymes: No results for input(s): CKTOTAL, CKMB, CKMBINDEX, TROPONINI in the last 168 hours.  BNP: Invalid input(s): POCBNP  CBG: Recent  Labs  Lab 02/11/20 1229 02/11/20 1627 02/11/20 2024 02/12/20 0031 02/12/20 0423  GLUCAP 239* 313* 259* 192* 158*    Microbiology: Results for orders placed or performed during the hospital encounter of 02/02/20  Urine culture     Status: None   Collection Time: 02/02/20 10:05 PM   Specimen: Urine, Random  Result Value Ref Range Status   Specimen Description URINE, RANDOM  Final   Special Requests NONE  Final   Culture   Final    NO GROWTH Performed at [facility], [place] [address] [place], [place] [address]    Report Status 02/04/2020 FINAL  Final  SARS Coronavirus 2 by RT PCR (hospital order, performed in [facility] hospital lab) Nasopharyngeal Nasopharyngeal Swab     Status: None   Collection Time: 02/02/20 10:07 PM   Specimen: Nasopharyngeal Swab  Result Value Ref Range Status   SARS Coronavirus 2 NEGATIVE NEGATIVE Final    Comment: (NOTE) SARS-CoV-2 target nucleic acids are NOT DETECTED.  The SARS-CoV-2 RNA is generally detectable in upper and lower respiratory specimens during the acute phase of infection. The lowest concentration of SARS-CoV-2 viral copies this assay  can detect is 250 copies / mL. A negative result does not preclude SARS-CoV-2 infection and should not be used as the sole basis for treatment or other patient management decisions.  A negative result may occur with improper specimen collection / handling, submission of specimen other than nasopharyngeal swab, presence of viral mutation(s) within the areas targeted by this assay, and inadequate number of viral copies (<250 copies / mL). A negative result must be combined with clinical observations, patient history, and epidemiological information.  Fact Sheet for Patients:   StrictlyIdeas.no  Fact Sheet for Healthcare Providers: BankingDealers.co.za  This test is not yet approved or  cleared by the [place] FDA and has been authorized for detection and/or diagnosis of SARS-CoV-2 by FDA under an Emergency Use Authorization (EUA).  This EUA will remain in effect (meaning this test can be used) for the duration of the COVID-19 declaration under Section 564(b)(1) of the Act, 21 U.S.C. section 360bbb-3(b)(1), unless the authorization is terminated or revoked sooner.  Performed at [facility], [place] [address] [place], [place] [address]     Coagulation Studies: No results for input(s): LABPROT, INR in the last 72 hours.  Urinalysis: No results for input(s): COLORURINE, LABSPEC, PHURINE, GLUCOSEU, HGBUR, BILIRUBINUR, KETONESUR, PROTEINUR, UROBILINOGEN, NITRITE, LEUKOCYTESUR in the last 72 hours.  Invalid input(s): APPERANCEUR    Imaging: No results found.   Medications:   .  sodium bicarbonate (isotonic) infusion in sterile water 50 mL/hr at 02/11/20 1519   . (feeding supplement) PROSource Plus  30 mL Oral BID BM  . Chlorhexidine Gluconate Cloth  6 each Topical Daily  . diltiazem  30 mg Oral Q6H  . feeding supplement (ENSURE ENLIVE)  237 mL Oral BID BM  . heparin injection (subcutaneous)  5,000 Units Subcutaneous Q8H   . insulin aspart  0-6 Units Subcutaneous Q4H  . insulin glargine  5 Units Subcutaneous Daily  . multivitamin with minerals  1 tablet Oral Daily  . potassium chloride  40 mEq Oral Daily  . tamsulosin  0.4 mg Oral Daily   acetaminophen **OR** acetaminophen, loperamide  Assessment/ Plan:  #Advanced nonoliguric AKI on CKD due to obstructive uropathy in solitary kidney concomitant with hypotension, reduced oral intake: Urinalysis has minimal protein and microscopic hematuria.  CT abdomen pelvis with severely atrophic left kidney and  right kidney has moderate hydroureteronephrosis. Urology was consulted and had stent placement on 8/25. Increasing urine output and creatinine level continue to improve..       D5W with 3 A of bicarb at 100 cc an hour.  Will decrease rate to 50 cc an hour.  Will assess labs 02/12/2020  #Solitary kidney with right hydronephrosis: Seen by urology and underwent cystoscopy and placement of double J stent.  #Hyperkalemia due to AKI on losartan.  Hyperkalemia has improved.  #Hypernatremia, hypovolemic: Improved.   #Anion gap metabolic acidosis: Continue sodium bicarbonate at D5W 3 A at 50 cc an hour  #Hypertension: Hypertension appears well-controlled   #Failure to thrive/weight loss, severe protein calorie malnutrition: Multiple etiology.  Uremia is contributing as well.  Continue oral supplement and management of renal failure as above.  #Hyperphosphatemia: Resolved will discontinue binders.   PTH 332   LOS: [place] @TODAY @7 :24 AM

## 2020-02-12 NOTE — Progress Notes (Signed)
Physical Therapy Treatment Patient Details Name: Paul Bond MRN: [id number] DOB: [birth year] Today's Date: 02/12/2020    History of Present Illness Paul Bond is a 79 y.o. male with medical history significant of history of cancer, diabetes, hypertension, stroke presenting to the ED via EMS from home for evaluation of failure to thrive.  Patient was hypotensive with EMS and given 800 cc normal saline in route. patient is normally quite physically active and cuts his own grass/does yard work.  He normally takes care of his wife as well.  For the past 3 weeks leading up to admission he has not been eating and has been bedbound; Principal problem -- Acute Kidney Injury    PT Comments    Pt with very flat affect this day, stating he feels tired. Pt tolerated bed mobility and transfer to stand only, attempted initiation of transfer to [facility] but after 1 step pt endorses significant weakness, unexpectedly sat EOB and flopped backwards onto bed. Pt endorses he just feels "weak", no other complaints or pain during session. Second stand attempt tolerated x10 seconds only before pt fatigued, with HRmax 141 bpm. Pt required significant assist for pericare this session, pt with stool incontinence for duration of session. PT to continue to follow acutely.     Follow Up Recommendations  SNF;Supervision for mobility/OOB     Equipment Recommendations  Rolling walker with 5" wheels;3in1 (PT)    Recommendations for Other Services       Precautions / Restrictions Precautions Precautions: Fall Restrictions Weight Bearing Restrictions: No    Mobility  Bed Mobility Overal bed mobility: Needs Assistance Bed Mobility: Rolling;Supine to Sit;Sit to Sidelying Rolling: Min assist   Supine to sit: Mod assist;HOB elevated   Sit to sidelying: Mod assist;HOB elevated General bed mobility comments: Min assist for rolling bilaterally for truncal translation during linen change, pericare for stool incontinence.  Mod assist for supine<>sit for trunk and LE management, scooting to and from EOB, and boost up in bed upon return to supine.  Transfers Overall transfer level: Needs assistance Equipment used: Rolling walker (2 wheeled) Transfers: Sit to/from Stand Sit to Stand: Min assist;From elevated surface         General transfer comment: Min assist for power up, steadying. Pt took x1 lateral step towards BSC on pt left side before stating "I can't", sat without warning, and flopped backwards on bed. Second stand performed for pericare, tolerated x10 seconds only before fatiguing and needing to sit.  Ambulation/Gait             General Gait Details: unable, pt with tachycardia to 141 bpm standing with LE fatigue and feeling of weakness   Stairs             Wheelchair Mobility    Modified Rankin (Stroke Patients Only)       Balance Overall balance assessment: Needs assistance Sitting-balance support: Feet supported;No upper extremity supported Sitting balance-[name] Scale: Fair     Standing balance support: During functional activity Standing balance-[name] Scale: Poor Standing balance comment: Requires UE support and external support                            Cognition Arousal/Alertness: Awake/alert Behavior During Therapy: WFL for tasks assessed/performed Overall Cognitive Status: No family/caregiver present to determine baseline cognitive functioning Area of Impairment: Attention;Problem solving;Following commands;Safety/judgement  Current Attention Level: Sustained   Following Commands: Follows one step commands with increased time Safety/Judgement: Decreased awareness of deficits;Decreased awareness of safety   Problem Solving: Slow processing;Decreased initiation;Requires verbal cues General Comments: Pt with flat affect today, states he feels "weak". Pt with x1 period of very poor safety awareness - stood EOB, quickly and  uncontrolled sat EOB and flopped backwards against pillow/bedrail. Pt states "I just felt weak, I am fine" but very unsafe descent.      Exercises      General Comments General comments (skin integrity, edema, etc.): HRmax 141 bpm, standing at EOB. Requires total assist for pericare.      Pertinent Vitals/Pain Pain Assessment: No/denies pain Pain Intervention(s): Monitored during session    Home Living                      Prior Function            PT Goals (current goals can now be found in the care plan section) Acute Rehab PT Goals Patient Stated Goal: get better soon PT Goal Formulation: With patient Time For Goal Achievement: 02/21/20 Potential to Achieve Goals: Good Progress towards PT goals: Not progressing toward goals - comment (fatigued, LE buckling in standing)    Frequency    Min 3X/week      PT Plan Current plan remains appropriate    Co-evaluation              AM-PAC PT "6 Clicks" Mobility   Outcome Measure  Help needed turning from your back to your side while in a flat bed without using bedrails?: A [place] Help needed moving from lying on your back to sitting on the side of a flat bed without using bedrails?: A [place] Help needed moving to and from a bed to a chair (including a wheelchair)?: A [place] Help needed standing up from a chair using your arms (e.g., wheelchair or bedside chair)?: A [place] Help needed to walk in hospital room?: A [place] Help needed climbing 3-5 steps with a railing? : A Lot 6 Click Score: 17    End of Session Equipment Utilized During Treatment: Gait belt Activity Tolerance: Patient limited by fatigue Patient left: with call bell/phone within reach;in bed;with bed alarm set Nurse Communication: Mobility status;Other (comment) (stool incontinence) PT Visit Diagnosis: Unsteadiness on feet (R26.81);Other abnormalities of gait and mobility (R26.89);Muscle weakness (generalized) (M62.81)     Time:  7106-2694 PT Time Calculation (min) (ACUTE ONLY): 35 min  Charges:  $Therapeutic Activity: 8-22 mins $Self Care/Home Management: 8-22                     Marisa Cyphers, PT Acute Rehabilitation Services Pager [phone]  Office [phone]   Hubert Raatz D Elonda Husky 02/12/2020, 12:37 PM

## 2020-02-13 DIAGNOSIS — Q600 Renal agenesis, unilateral: Secondary | ICD-10-CM

## 2020-02-13 DIAGNOSIS — R195 Other fecal abnormalities: Secondary | ICD-10-CM

## 2020-02-13 DIAGNOSIS — D649 Anemia, unspecified: Secondary | ICD-10-CM

## 2020-02-13 DIAGNOSIS — Z905 Acquired absence of kidney: Secondary | ICD-10-CM

## 2020-02-13 DIAGNOSIS — A419 Sepsis, unspecified organism: Secondary | ICD-10-CM

## 2020-02-13 DIAGNOSIS — E43 Unspecified severe protein-calorie malnutrition: Secondary | ICD-10-CM

## 2020-02-13 DIAGNOSIS — Z85038 Personal history of other malignant neoplasm of large intestine: Secondary | ICD-10-CM

## 2020-02-13 DIAGNOSIS — R6520 Severe sepsis without septic shock: Secondary | ICD-10-CM

## 2020-02-13 DIAGNOSIS — E872 Acidosis: Secondary | ICD-10-CM

## 2020-02-13 DIAGNOSIS — N139 Obstructive and reflux uropathy, unspecified: Secondary | ICD-10-CM

## 2020-02-13 LAB — RENAL FUNCTION PANEL
Albumin: 2.5 g/dL — ABNORMAL LOW (ref 3.5–5.0)
Albumin: 2.4 g/dL — ABNORMAL LOW (ref 3.5–5.0)
Anion gap: 17 — ABNORMAL HIGH (ref 5–15)
Anion gap: 14 (ref 5–15)
BUN: 94 mg/dL — ABNORMAL HIGH (ref 8–23)
BUN: 85 mg/dL — ABNORMAL HIGH (ref 8–23)
CO2: 22 mmol/L (ref 22–32)
CO2: 23 mmol/L (ref 22–32)
Calcium: 8.9 mg/dL (ref 8.9–10.3)
Calcium: 8.8 mg/dL — ABNORMAL LOW (ref 8.9–10.3)
Chloride: 99 mmol/L (ref 98–111)
Chloride: 107 mmol/L (ref 98–111)
Creatinine, Ser: 6.03 mg/dL — ABNORMAL HIGH (ref 0.61–1.24)
Creatinine, Ser: 6.1 mg/dL — ABNORMAL HIGH (ref 0.61–1.24)
GFR calc Af Amer: 9 mL/min — ABNORMAL LOW (ref >60)
GFR calc Af Amer: 9 mL/min — ABNORMAL LOW (ref >60)
GFR calc non Af Amer: 8 mL/min — ABNORMAL LOW (ref >60)
GFR calc non Af Amer: 8 mL/min — ABNORMAL LOW (ref >60)
Glucose, Bld: 123 mg/dL — ABNORMAL HIGH (ref 70–99)
Glucose, Bld: 131 mg/dL — ABNORMAL HIGH (ref 70–99)
Phosphorus: 1.8 mg/dL — ABNORMAL LOW (ref 2.5–4.6)
Phosphorus: 7 mg/dL — ABNORMAL HIGH (ref 2.5–4.6)
Potassium: 6.3 mmol/L (ref 3.5–5.1)
Potassium: 5.2 mmol/L — ABNORMAL HIGH (ref 3.5–5.1)
Sodium: 138 mmol/L (ref 135–145)
Sodium: 144 mmol/L (ref 135–145)

## 2020-02-13 LAB — ABO/RH: ABO/RH(D): O POS

## 2020-02-13 LAB — CBC WITH DIFFERENTIAL/PLATELET
Abs Immature Granulocytes: 0.67 10*3/uL — ABNORMAL HIGH (ref 0.00–0.07)
Basophils Absolute: 0.1 10*3/uL (ref 0.0–0.1)
Basophils Relative: 0 %
Eosinophils Absolute: 0 10*3/uL (ref 0.0–0.5)
Eosinophils Relative: 0 %
HCT: 21.1 % — ABNORMAL LOW (ref 39.0–52.0)
Hemoglobin: 6.8 g/dL — CL (ref 13.0–17.0)
Immature Granulocytes: 2 %
Lymphocytes Relative: 2 %
Lymphs Abs: 0.7 10*3/uL (ref 0.7–4.0)
MCH: 29.3 pg (ref 26.0–34.0)
MCHC: 32.2 g/dL (ref 30.0–36.0)
MCV: 90.9 fL (ref 80.0–100.0)
Monocytes Absolute: 1.6 10*3/uL — ABNORMAL HIGH (ref 0.1–1.0)
Monocytes Relative: 5 %
Neutro Abs: 29.2 10*3/uL — ABNORMAL HIGH (ref 1.7–7.7)
Neutrophils Relative %: 91 %
Platelets: 267 10*3/uL (ref 150–400)
RBC: 2.32 MIL/uL — ABNORMAL LOW (ref 4.22–5.81)
RDW: 15.8 % — ABNORMAL HIGH (ref 11.5–15.5)
WBC: 32.3 10*3/uL — ABNORMAL HIGH (ref 4.0–10.5)
nRBC: 0.1 % (ref 0.0–0.2)

## 2020-02-13 LAB — URINALYSIS, ROUTINE W REFLEX MICROSCOPIC
Bilirubin Urine: NEGATIVE
Glucose, UA: 150 mg/dL — AB
Ketones, ur: NEGATIVE mg/dL
Nitrite: NEGATIVE
Protein, ur: 100 mg/dL — AB
RBC / HPF: >50 RBC/hpf — ABNORMAL HIGH (ref 0–5)
Specific Gravity, Urine: 1.006 (ref 1.005–1.030)
WBC, UA: >50 WBC/hpf — ABNORMAL HIGH (ref 0–5)
pH: 9 — ABNORMAL HIGH (ref 5.0–8.0)

## 2020-02-13 LAB — BLOOD CULTURE ID PANEL (REFLEXED) - BCID2

## 2020-02-13 LAB — BASIC METABOLIC PANEL
Anion gap: 12 (ref 5–15)
BUN: 92 mg/dL — ABNORMAL HIGH (ref 8–23)
CO2: 23 mmol/L (ref 22–32)
Calcium: 8.6 mg/dL — ABNORMAL LOW (ref 8.9–10.3)
Chloride: 102 mmol/L (ref 98–111)
Creatinine, Ser: 5.9 mg/dL — ABNORMAL HIGH (ref 0.61–1.24)
GFR calc Af Amer: 10 mL/min — ABNORMAL LOW (ref >60)
GFR calc non Af Amer: 8 mL/min — ABNORMAL LOW (ref >60)
Glucose, Bld: 140 mg/dL — ABNORMAL HIGH (ref 70–99)
Potassium: 6.4 mmol/L (ref 3.5–5.1)
Sodium: 137 mmol/L (ref 135–145)

## 2020-02-13 LAB — CBC
HCT: 24.5 % — ABNORMAL LOW (ref 39.0–52.0)
Hemoglobin: 7.9 g/dL — ABNORMAL LOW (ref 13.0–17.0)
MCH: 28.8 pg (ref 26.0–34.0)
MCHC: 32.2 g/dL (ref 30.0–36.0)
MCV: 89.4 fL (ref 80.0–100.0)
Platelets: 256 10*3/uL (ref 150–400)
RBC: 2.74 MIL/uL — ABNORMAL LOW (ref 4.22–5.81)
RDW: 16.5 % — ABNORMAL HIGH (ref 11.5–15.5)
WBC: 27.2 10*3/uL — ABNORMAL HIGH (ref 4.0–10.5)
nRBC: 0 % (ref 0.0–0.2)

## 2020-02-13 LAB — POTASSIUM: Potassium: 6.1 mmol/L — ABNORMAL HIGH (ref 3.5–5.1)

## 2020-02-13 LAB — GLUCOSE, CAPILLARY
Glucose-Capillary: 125 mg/dL — ABNORMAL HIGH (ref 70–99)
Glucose-Capillary: 138 mg/dL — ABNORMAL HIGH (ref 70–99)
Glucose-Capillary: 145 mg/dL — ABNORMAL HIGH (ref 70–99)
Glucose-Capillary: 154 mg/dL — ABNORMAL HIGH (ref 70–99)
Glucose-Capillary: 189 mg/dL — ABNORMAL HIGH (ref 70–99)
Glucose-Capillary: 69 mg/dL — ABNORMAL LOW (ref 70–99)
Glucose-Capillary: 87 mg/dL (ref 70–99)

## 2020-02-13 LAB — LACTIC ACID, PLASMA
Lactic Acid, Venous: 5.3 mmol/L (ref 0.5–1.9)
Lactic Acid, Venous: 2 mmol/L (ref 0.5–1.9)

## 2020-02-13 LAB — BLOOD GAS, ARTERIAL
Acid-Base Excess: 1.9 mmol/L (ref 0.0–2.0)
Bicarbonate: 24.5 mmol/L (ref 20.0–28.0)
Drawn by: 42624
FIO2: 21
O2 Saturation: 99.1 %
Patient temperature: 39
pCO2 arterial: 31.8 mmHg — ABNORMAL LOW (ref 32.0–48.0)
pH, Arterial: 7.506 — ABNORMAL HIGH (ref 7.350–7.450)
pO2, Arterial: 121 mmHg — ABNORMAL HIGH (ref 83.0–108.0)

## 2020-02-13 LAB — HEPATIC FUNCTION PANEL
ALT: 43 U/L (ref 0–44)
AST: 33 U/L (ref 15–41)
Albumin: 2.3 g/dL — ABNORMAL LOW (ref 3.5–5.0)
Alkaline Phosphatase: 54 U/L (ref 38–126)
Bilirubin, Direct: 0.2 mg/dL (ref 0.0–0.2)
Indirect Bilirubin: 0.6 mg/dL (ref 0.3–0.9)
Total Bilirubin: 0.8 mg/dL (ref 0.3–1.2)
Total Protein: 5.8 g/dL — ABNORMAL LOW (ref 6.5–8.1)

## 2020-02-13 LAB — PROCALCITONIN: Procalcitonin: 25.81 ng/mL

## 2020-02-13 LAB — HEMOGLOBIN AND HEMATOCRIT, BLOOD
HCT: 20 % — ABNORMAL LOW (ref 39.0–52.0)
Hemoglobin: 6.5 g/dL — CL (ref 13.0–17.0)

## 2020-02-13 LAB — PREPARE RBC (CROSSMATCH)

## 2020-02-13 MED ORDER — ALBUTEROL SULFATE (2.5 MG/3ML) 0.083% IN NEBU
2.5000 mg | INHALATION_SOLUTION | Freq: Once | RESPIRATORY_TRACT | Status: AC
  Administered 2020-02-13: 2.5 mg via RESPIRATORY_TRACT
  Filled 2020-02-13: qty 3

## 2020-02-13 MED ORDER — SODIUM ZIRCONIUM CYCLOSILICATE 10 G PO PACK
10.0000 g | PACK | Freq: Three times a day (TID) | ORAL | Status: DC
  Administered 2020-02-13 (×2): 10 g via ORAL
  Filled 2020-02-13 (×3): qty 1

## 2020-02-13 MED ORDER — ACETAMINOPHEN 500 MG PO TABS
1000.0000 mg | ORAL_TABLET | Freq: Once | ORAL | Status: AC
  Administered 2020-02-13: 1000 mg via ORAL
  Filled 2020-02-13: qty 2

## 2020-02-13 MED ORDER — SODIUM PHOSPHATES 45 MMOLE/15ML IV SOLN
30.0000 mmol | Freq: Once | INTRAVENOUS | Status: AC
  Administered 2020-02-13: 30 mmol via INTRAVENOUS
  Filled 2020-02-13: qty 10

## 2020-02-13 MED ORDER — SODIUM CHLORIDE 0.9% IV SOLUTION: Freq: Once | INTRAVENOUS | Status: AC

## 2020-02-13 MED ORDER — PIPERACILLIN-TAZOBACTAM 3.375 G IVPB 30 MIN
3.3750 g | Freq: Three times a day (TID) | INTRAVENOUS | Status: DC
  Administered 2020-02-13 – 2020-02-14 (×4): 3.375 g via INTRAVENOUS
  Filled 2020-02-13 (×9): qty 50

## 2020-02-13 MED ORDER — SODIUM CHLORIDE 0.9 % IV BOLUS
1000.0000 mL | Freq: Once | INTRAVENOUS | Status: AC
  Administered 2020-02-13: 1000 mL via INTRAVENOUS

## 2020-02-13 MED ORDER — SODIUM CHLORIDE 0.9 % IV SOLN: INTRAVENOUS | Status: DC

## 2020-02-13 MED ORDER — SODIUM POLYSTYRENE SULFONATE 15 GM/60ML PO SUSP
30.0000 g | Freq: Once | ORAL | Status: AC
  Administered 2020-02-13: 30 g via ORAL
  Filled 2020-02-13: qty 120

## 2020-02-13 NOTE — Progress Notes (Signed)
Dr.Zierle-Ghosl wanted to be text Foley cath. Results . 650 ml; was obtain and text to her.

## 2020-02-13 NOTE — Progress Notes (Signed)
Text page Fr. Clearence Ped to look at abnl Lactic Acid and BMP results critical results

## 2020-02-13 NOTE — Progress Notes (Signed)
PHARMACY - PHYSICIAN COMMUNICATION CRITICAL VALUE ALERT - BLOOD CULTURE IDENTIFICATION (BCID)  Paul Bond is an 79 y.o. male who presented to [place] on 02/02/2020 with a chief complaint of failure to thrive.  Assessment: GNR in 2of4 bottles BCID with Enterobacter - no specieation. no resistance detected  Name of physician (or Provider) Contacted: Dr. Lonny Prude  Current antibiotics: Zosyn 3.375g IV q8h  Changes to prescribed antibiotics recommended:  Patient is on recommended antibiotics - No changes needed  Results for orders placed or performed during the hospital encounter of 02/02/20  Blood Culture ID Panel (Reflexed) (Collected: 02/12/2020  8:15 PM)  Result Value Ref Range   Enterococcus faecalis NOT DETECTED NOT DETECTED   Enterococcus Faecium NOT DETECTED NOT DETECTED   Listeria monocytogenes NOT DETECTED NOT DETECTED   Staphylococcus species NOT DETECTED NOT DETECTED   Staphylococcus aureus (BCID) NOT DETECTED NOT DETECTED   Staphylococcus epidermidis NOT DETECTED NOT DETECTED   Staphylococcus lugdunensis NOT DETECTED NOT DETECTED   Streptococcus species NOT DETECTED NOT DETECTED   Streptococcus agalactiae NOT DETECTED NOT DETECTED   Streptococcus pneumoniae NOT DETECTED NOT DETECTED   Streptococcus pyogenes NOT DETECTED NOT DETECTED   A.calcoaceticus-baumannii NOT DETECTED NOT DETECTED   Bacteroides fragilis NOT DETECTED NOT DETECTED   Enterobacterales DETECTED (A) NOT DETECTED   Enterobacter cloacae complex NOT DETECTED NOT DETECTED   Escherichia coli NOT DETECTED NOT DETECTED   Klebsiella aerogenes NOT DETECTED NOT DETECTED   Klebsiella oxytoca NOT DETECTED NOT DETECTED   Klebsiella pneumoniae NOT DETECTED NOT DETECTED   Proteus species NOT DETECTED NOT DETECTED   Salmonella species NOT DETECTED NOT DETECTED   Serratia marcescens NOT DETECTED NOT DETECTED   Haemophilus influenzae NOT DETECTED NOT DETECTED   Neisseria meningitidis NOT DETECTED NOT DETECTED    Pseudomonas aeruginosa NOT DETECTED NOT DETECTED   Stenotrophomonas maltophilia NOT DETECTED NOT DETECTED   Candida albicans NOT DETECTED NOT DETECTED   Candida auris NOT DETECTED NOT DETECTED   Candida glabrata NOT DETECTED NOT DETECTED   Candida krusei NOT DETECTED NOT DETECTED   Candida parapsilosis NOT DETECTED NOT DETECTED   Candida tropicalis NOT DETECTED NOT DETECTED   Cryptococcus neoformans/gattii NOT DETECTED NOT DETECTED   CTX-M ESBL NOT DETECTED NOT DETECTED   Carbapenem resistance IMP NOT DETECTED NOT DETECTED   Carbapenem resistance KPC NOT DETECTED NOT DETECTED   Carbapenem resistance NDM NOT DETECTED NOT DETECTED   Carbapenem resist OXA 48 LIKE NOT DETECTED NOT DETECTED   Carbapenem resistance VIM NOT DETECTED NOT DETECTED   Romilda Garret, PharmD PGY1 Acute Care Pharmacy Resident Phone: [phone] 02/13/2020 1:38 PM  Please check [email or web address] for unit specific pharmacy phone numbers.

## 2020-02-13 NOTE — Progress Notes (Signed)
Repeat CBG 87

## 2020-02-13 NOTE — Progress Notes (Signed)
Call Charge R.N. Roselyn Reef On [address] [place] about putting in a Coude cath. She will come and try when approp. To leave her floor.

## 2020-02-13 NOTE — Progress Notes (Addendum)
Have tried multi times to get lab but unable to get them to answer phone to get patient blood work drawn.

## 2020-02-13 NOTE — Progress Notes (Addendum)
Text page Dr. Clearence Ped CBC results critical results.
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
## 2020-02-13 NOTE — Progress Notes (Signed)
Explain to patient that M.D. wanted to give him some blood tonight. That his blood count was low. Patient read the permit and agree to receive blood. Patient tried to sign permit but was too weak to do so. Got a verbal consent that he gave me and [facility]. and we sign permit .

## 2020-02-13 NOTE — Consult Note (Addendum)
Consultation  Referring Provider: [facility]/ [place] Primary Care Physician:  Administration, [facility] Primary Gastroenterologist:  unassigned  Reason for Consultation: Anemia, heme positive stool  HPI: Paul Bond is a 79 y.o. male, who we are asked to evaluate for anemia and heme positive stool.  Patient was admitted on 02/02/2020 with failure to thrive.  He had apparently been ill at home over the prior 3 weeks and in bed for much of that time.  He had complained of complete lack of appetite and it had a significant weight loss.  Also complaining of progressive weakness. He has history of hypertension, prior CVA and a sigmoid colectomy, per patient report done multiple years ago for colon cancer.  His work-up at that time was done at the [facility]  He had admission here in 2012 due to a partial small bowel obstruction.  He reports that he did have adjuvant chemotherapy after colon resection. Work-up on admission consistent with acute renal failure with creatinine of 18.3, hyperkalemia and metabolic acidosis.  Further work-up with noncontrasted CT of the chest abdomen and pelvis with finding of a 7 mm nodule in the left lower lobe, moderate right hydronephrosis and a large amount of gas in the bladder, more than would be expected secondary to Foley.  He is status post sigmoid colectomy and he may be some infiltration of the presacral space. He subsequently underwent cystoscopy and stent placement per urology. Since admit he also had developed new atrial fibrillation and has been on subcu heparin every 8 hours, stopped today.  On admit hemoglobin of 10 iron studies from 02/02/2020 with serum iron 135 TIBC 153 iron sat of 88 and ferritin 606.  Documented heme positive earlier in the mission, no overt bleeding or melena. Hemoglobin down to 7.4 on 02/11/2020 7.6 on 02/12/2020 and 6.8 today.  He is being transfused. Overnight he has also developed marked leukocytosis with WBC of 32,000 and elevated lactate  at 5.3. He is being followed by nephrology and felt to have acute kidney injury on significant chronic kidney disease creatinine still in the 6 range.  Being considered for dialysis if no improvement over the next few days.  He has developed fever, blood cultures were done yesterday and thus far positive for an Enterobacter species.  Patient denies any abdominal pain, denies any heartburn indigestion dysphagia or odynophagia.  No recent changes in his bowel habits, no melena or hematochezia.    Past Medical History:  Diagnosis Date   Cancer ([place])    Diabetes mellitus without complication ([place])    Hypertension    Stroke [facility])     Past Surgical History:  Procedure Laterality Date   COLON SURGERY     CYSTOSCOPY W/ URETERAL STENT PLACEMENT Right 02/03/2020   Procedure: CYSTOSCOPY WITH RETROGRADE PYELOGRAM/URETERAL DOUBLE J STENT PLACEMENT;  Surgeon: [name], MD;  Location: [facility];  Service: Urology;  Laterality: Right;    Prior to Admission medications   Medication Sig Start Date End Date Taking? Authorizing Provider  AMLODIPINE BENZOATE PO Take 1 tablet by mouth daily.   Yes [provider]  insulin glargine (LANTUS) 100 UNIT/ML injection Inject 18 Units into the skin daily.   Yes [provider]  LOSARTAN POTASSIUM PO Take 1 tablet by mouth daily.   Yes [provider]  mirtazapine (REMERON) 7.5 MG tablet Take 1 tablet (7.5 mg total) by mouth at bedtime. 01/20/20  Yes Wardell Honour, MD  PRESCRIPTION MEDICATION Take 1 tablet by mouth in  the morning and at bedtime. Fluid pill   Yes [provider]    Current Facility-Administered Medications  Medication Dose Route Frequency Provider Last Rate Last Admin   (feeding supplement) PROSource Plus liquid 30 mL  30 mL Oral BID BM Georgette Shell, MD   30 mL at 02/13/20 0856   0.9 %  sodium chloride infusion   Intravenous Continuous Edrick Oh, MD       acetaminophen (TYLENOL)  tablet 650 mg  650 mg Oral Q6H PRN [name], MD   650 mg at 02/12/20 9735   Or   acetaminophen (TYLENOL) suppository 650 mg  650 mg Rectal Q6H PRN [name], MD   650 mg at 02/12/20 1941   Chlorhexidine Gluconate Cloth 2 % PADS 6 each  6 each Topical Daily Georgette Shell, MD   6 each at 02/11/20 1000   diltiazem (CARDIZEM) tablet 60 mg  60 mg Oral Q8H Dahal, Marlowe Aschoff, MD   60 mg at 02/13/20 0849   feeding supplement (ENSURE ENLIVE) (ENSURE ENLIVE) liquid 237 mL  237 mL Oral BID BM Georgette Shell, MD   237 mL at 02/12/20 1004   insulin aspart (novoLOG) injection 0-6 Units  0-6 Units Subcutaneous Q4H [name], MD   1 Units at 02/13/20 0019   insulin glargine (LANTUS) injection 10 Units  10 Units Subcutaneous Daily Terrilee Croak, MD   10 Units at 02/13/20 1413   multivitamin with minerals tablet 1 tablet  1 tablet Oral Daily Georgette Shell, MD   1 tablet at 02/13/20 0849   piperacillin-tazobactam (ZOSYN) IVPB 3.375 g  3.375 g Intravenous Q8H Zierle-Ghosh, Asia B, DO 100 mL/hr at 02/13/20 0543 3.375 g at 02/13/20 0543   sodium phosphate 30 mmol in dextrose 5 % 250 mL infusion  30 mmol Intravenous Once Edrick Oh, MD 43 mL/hr at 02/13/20 0849 30 mmol at 02/13/20 0849   sodium zirconium cyclosilicate (LOKELMA) packet 10 g  10 g Oral Once Zierle-Ghosh, Asia B, DO       sodium zirconium cyclosilicate (LOKELMA) packet 10 g  10 g Oral TID Edrick Oh, MD       tamsulosin [facility]) capsule 0.4 mg  0.4 mg Oral Daily [name], MD   0.4 mg at 02/13/20 0849    Allergies as of 02/02/2020   (No Known Allergies)    History reviewed. No pertinent family history.  Social History   Socioeconomic History   Marital status: Married    Spouse name: Not on file   Number of children: Not on file   Years of education: Not on file   Highest education level: Not on file  Occupational History   Not on file  Tobacco Use   Smoking status:  Former Smoker   Smokeless tobacco: Never Used  Scientific laboratory technician Use: Never used  Substance and Sexual Activity   Alcohol use: Never   Drug use: Never   Sexual activity: Not on file  Other Topics Concern   Not on file  Social History Narrative   Not on file   Social Determinants of Health   Financial Resource Strain:    Difficulty of Paying Living Expenses: Not on file  Food Insecurity:    Worried About [place] in the Last Year: Not on file   [place] in the Last Year: Not on file  Transportation Needs:    Lack of Transportation (Medical): Not on file   Lack  of Transportation (Non-Medical): Not on file  Physical Activity:    Days of Exercise per Week: Not on file   Minutes of Exercise per Session: Not on file  Stress:    Feeling of Stress : Not on file  Social Connections:    Frequency of Communication with Friends and Family: Not on file   Frequency of Social Gatherings with Friends and Family: Not on file   Attends Religious Services: Not on file   Active Member of Clubs or Organizations: Not on file   Attends Archivist Meetings: Not on file   Marital Status: Not on file  Intimate Partner Violence:    Fear of Current or Ex-Partner: Not on file   Emotionally Abused: Not on file   Physically Abused: Not on file   Sexually Abused: Not on file    Review of Systems: Gen: Denies any fever, chills, sweats, anorexia, fatigue, weakness, malaise, weight loss, and sleep disorder CV: Denies chest pain, angina, palpitations, syncope, orthopnea, PND, peripheral edema, and claudication. Resp: Denies dyspnea at rest, dyspnea with exercise, cough, sputum, wheezing, coughing up blood, and pleurisy. GI: Denies vomiting blood, jaundice, and fecal incontinence.   Denies dysphagia or odynophagia. GU : Denies urinary burning, blood in urine, urinary frequency, urinary hesitancy, nocturnal urination, and urinary incontinence. MS:  Denies joint pain, limitation of movement, and swelling, stiffness, low back pain, extremity pain. Denies muscle weakness, cramps, atrophy.  Derm: Denies rash, itching, dry skin, hives, moles, warts, or unhealing ulcers.  Psych: Denies depression, anxiety, memory loss, suicidal ideation, hallucinations, paranoia, and confusion. Heme: Denies bruising, bleeding, and enlarged lymph nodes. Neuro:  Denies any headaches, dizziness, paresthesias. Endo:  Denies any problems with DM, thyroid, adrenal function.  Physical Exam: Vital signs in last 24 hours: Temp:  [98.8 F (37.1 C)-104.2 F (40.1 C)] 99.4 F (37.4 C) (09/04 1358) Pulse Rate:  [97-142] 100 (09/04 1249) Resp:  [12-27] 12 (09/04 1249) BP: (111-137)/(44-80) 128/65 (09/04 1358) SpO2:  [95 %-100 %] 97 % (09/04 1249) Last BM Date: 02/12/20 General:   Alert, well-developed, elderly African-American male, pleasant and cooperative, somewhat lethargic but conversing a bit. Head:  Normocephalic and atraumatic. Eyes:  Sclera clear, no icterus.   Conjunctiva pale Ears:  Normal auditory acuity. Nose:  No deformity, discharge,  or lesions. Mouth:  No deformity or lesions.   Neck:  Supple; no masses or thyromegaly. Lungs:  Clear throughout to auscultation.   No wheezes, crackles, or rhonchi.  Heart:  irregular rate and rhythm; no murmurs, clicks, rubs,  or gallops. Abdomen:  Soft,nontender, BS active,nonpalp mass or hsm.  Midline incisional scar Rectal:  Deferred , previously documented heme positive Msk:  Symmetrical without gross deformities. . Pulses:  Normal pulses noted. Extremities:  Without clubbing or edema. Neurologic:  Alert and  oriented x4;  grossly normal neurologically. Skin:  Intact without significant lesions or rashes.. Psych:  Alert and cooperative. Normal mood and affect.  Intake/Output from previous day: 09/03 0701 - 09/04 0700 In: 1840 [P.O.:240; IV Piggyback:1600] Out: 1600 [Urine:1600] Intake/Output this  shift: Total I/O In: -  Out: 1475 [Urine:1475]  Lab Results: Recent Labs    02/11/20 0254 02/11/20 0254 02/12/20 0821 02/13/20 0130 02/13/20 0553  WBC 12.2*  --  10.3 32.3*  --   HGB 7.4*   < > 7.6* 6.8* 6.5*  HCT 23.2*   < > 23.8* 21.1* 20.0*  PLT 240  --  276 267  --    < > = values  in this interval not displayed.   BMET Recent Labs    02/12/20 2013 02/12/20 2243 02/13/20 0130 02/13/20 0553 02/13/20 0956  NA 137  --  138 137  --   K 6.7*   < > 6.3* 6.4* 6.1*  CL 98  --  99 102  --   CO2 21*  --  22 23  --   GLUCOSE 193*  --  123* 140*  --   BUN 92*  --  94* 92*  --   CREATININE 6.05*  --  6.03* 5.90*  --   CALCIUM 8.9  --  8.9 8.6*  --    < > = values in this interval not displayed.   LFT Recent Labs    02/13/20 0956  PROT 5.8*  ALBUMIN 2.3*  AST 33  ALT 43  ALKPHOS 54  BILITOT 0.8  BILIDIR 0.2  IBILI 0.6    IMPRESSION:  #63   79 year old African-American male admitted 02/03/2020 with failure to thrive, 3-week history of lack of appetite, weight loss, and progressive weakness. On admit found to be in acute renal failure-AKI on chronic kidney disease.  Creatinine 18. On noncontrasted imaging had moderate right hydronephrosis.  He underwent cystoscopy and stent placement.  Per notes no stones note, no definite etiology of obstruction found.  Nephrology following, he has had some improvement in parameters, but still may require dialysis.  #2 new fever and leukocytosis today, consistent with SIRS/sepsis.  Blood cultures yesterday growing Enterobacter suspect urologic source-on IV Zosyn  #3 anemia, heme positive stool.  Iron studies on admit more consistent with anemia of chronic disease.  He has had drift in hemoglobin since admit multifactoral without overt bleeding.  Down to 6.8 today and now transfusing. Anemia may be in part secondary to chronic kidney disease, however patient does have history of colon cancer remotely status post sigmoid colectomy, with  no regular follow-up by his report. Rule out underlying malignancy, noncontrasted CT also suggested some mild infiltrative changes in the presacral space.  #4 malnutrition #5 prior CVA #6 new atrial fib-had been on subcu heparin every 8 now on hold  PLAN: Patient will need eventual endoscopic evaluation with colonoscopy and EGD, once sepsis has resolved. Continue to trend hemoglobins and transfuse as needed to keep hemoglobin above 7 We will discuss further abdominal/pelvic imaging with MRI, secondary to concern for underlying malignancy.   Amy EsterwoodPA-C  02/13/2020, 2:18 PM      Attending Physician Note   I have taken a history, examined the patient and reviewed the chart. I agree with the Advanced Practitioner's note, impression and recommendations.  Impression: * Anemia and occult blood in stools. No overt GI bleeding noted. CKD likely contributing to anemia.  * History of colon cancer with sigmoid colon resection around 2011.  * Sepsis, Enterobacter bacteremia with with urologic source suspected * AKI on CKD * New afib on subq heparin, now on hold * Hyperkalemia   Recommendation: * Colonoscopy and EGD when acute problems resolve or substantially improve    * Trend CBC and transfusion to maintain Hgb > 7.  * We will see again on Monday or Tuesday. Call for questions over the rest of the holiday weekend  Lucio Edward, MD [facility] Gastroenterology

## 2020-02-13 NOTE — Progress Notes (Signed)
All lab work drawn and results called to Dr. Stephani Police. See orders.

## 2020-02-13 NOTE — Progress Notes (Signed)
ABG results text page to Dr. zierle-Ghosl see orders. M.D. also aware of repeat Potassium 6.7 and was draw before all previous orders were done.

## 2020-02-13 NOTE — Progress Notes (Addendum)
CBG 69 Patient voiced no complaints. Feed patient a cup of vanilla pudding and he ate all of it. And drink 120 ml of spirit. Will reck CBG in 15 mins.R.N. aware

## 2020-02-13 NOTE — Progress Notes (Signed)
Patient in bed shivering Temp 103.2 oral. H.R. up 147 S.T., B.P. 119/79, MAP-90, RESP.-25 Rm. Air STATS 100%.Skin warm and dry. Alert and oriented. Daryl R.N. aware. Tylenol Supp. P.R. given. Patient has not vded since foley D.C. this a.m. Bladder scan 339.  Dr. Cooper Render on call and was text page the above. See orders.

## 2020-02-13 NOTE — Progress Notes (Signed)
[facility] ROUNDING NOTE   Subjective:   This is a 79 year old male with a history of hypertension diabetes stroke presented with failure to thrive and advanced renal failure with a BUN of 245 and a creatinine of 26 and hyperkalemia.  Renal ultrasound showed a solitary kidney and hydronephrosis.  CT scan of abdomen and pelvis showed right kidney hydronephrosis urology consulted stent placed 02/03/2020 increasing urine output and improved renal function.  Blood pressure 119/59 pulse 101 temperature 99.2 O2 sats 99% room air urine output 950 cc 02/12/2020  Sodium 138 potassium 6.3 chloride 99 CO2 22 creatinine 6.03 BUN 94 glucose 123 phosphorus 1.8 albumin 2.5 hemoglobin 6.8  Lokelma 10 g IV Zosyn 3.375 g every 8 hours IV sodium bicarbonate 50 cc/h   Objective:  Vital signs in last 24 hours:  Temp:  [98.8 F (37.1 C)-104.2 F (40.1 C)] 99.2 F (37.3 C) (09/04 0406) Pulse Rate:  [93-142] 107 (09/04 0406) Resp:  [16-27] 16 (09/04 0406) BP: (111-137)/(44-80) 119/59 (09/04 0406) SpO2:  [95 %-100 %] 98 % (09/04 0412)  Weight change:  Filed Weights   02/03/20 2213 02/05/20 0411 02/07/20 0550  Weight: 66.9 kg 66.5 kg 67.5 kg    Intake/Output: I/O last 3 completed shifts: In: 1829 [P.O.:240; IV Piggyback:1600] Out: 2275 [Urine:2275]   Intake/Output this shift:  No intake/output data recorded.  General: Alert awake and comfortable Heart:RRR, s1s2 nl, no rubs Lungs: Clear b/l, no crackle Abdomen:soft, Non-tender, non-distended Extremities: No edema Neurology: Alert awake and following commands. Urology: Has Foley catheter and bag has pinkish urine.   Basic Metabolic Panel: Recent Labs  Lab 02/06/20 0848 02/07/20 0255 02/09/20 0144 02/09/20 0144 02/10/20 9371 02/10/20 6967 02/11/20 0254 02/11/20 0254 02/12/20 0821 02/12/20 2013 02/12/20 2243 02/13/20 0130  NA  --    < > 141   < > 139  --  136  133*  --  138 137  --  138  K  --    < > 4.5   < > 4.7    < > 4.7  4.6  --  5.6* 6.7* 6.7* 6.3*  CL  --    < > 109   < > 110  --  104  101  --  102 98  --  99  CO2  --    < > 20*   < > 16*  --  22  22  --  24 21*  --  22  GLUCOSE  --    < > 163*   < > 128*  --  141*  139*  --  167* 193*  --  123*  BUN  --    < > 90*   < > 83*  --  82*  85*  --  77* 92*  --  94*  CREATININE  --    < > 6.50*   < > 5.56*  --  5.13*  5.04*  --  5.26* 6.05*  --  6.03*  CALCIUM  --    < > 8.3*   < > 8.3*   < > 8.4*  8.4*   < > 8.4* 8.9  --  8.9  MG 2.3  --   --   --   --   --   --   --   --   --   --   --   PHOS  --    < > 4.0  --  3.6  --  3.5  --  3.7  --   --  1.8*   < > = values in this interval not displayed.    Liver Function Tests: Recent Labs  Lab 02/09/20 0144 02/10/20 0318 02/11/20 0254 02/12/20 0821 02/13/20 0130  ALBUMIN 2.4* 2.4* 2.3* 2.3* 2.5*   No results for input(s): LIPASE, AMYLASE in the last 168 hours. No results for input(s): AMMONIA in the last 168 hours.  CBC: Recent Labs  Lab 02/11/20 0254 02/12/20 0821 02/13/20 0130 02/13/20 0553  WBC 12.2* 10.3 32.3*  --   NEUTROABS 8.1* 6.8 29.2*  --   HGB 7.4* 7.6* 6.8* 6.5*  HCT 23.2* 23.8* 21.1* 20.0*  MCV 92.1 91.9 90.9  --   PLT 240 276 267  --     Cardiac Enzymes: No results for input(s): CKTOTAL, CKMB, CKMBINDEX, TROPONINI in the last 168 hours.  BNP: Invalid input(s): POCBNP  CBG: Recent Labs  Lab 02/12/20 1628 02/12/20 2032 02/13/20 0011 02/13/20 0409 02/13/20 0459  GLUCAP 200* 199* 189* 69* 87    Microbiology: Results for orders placed or performed during the hospital encounter of 02/02/20  Urine culture     Status: None   Collection Time: 02/02/20 10:05 PM   Specimen: Urine, Random  Result Value Ref Range Status   Specimen Description URINE, RANDOM  Final   Special Requests NONE  Final   Culture   Final    NO GROWTH Performed at [facility], [address], [place], [place] [address]    Report Status 02/04/2020 FINAL  Final  SARS Coronavirus  2 by RT PCR (hospital order, performed in [facility] hospital lab) Nasopharyngeal Nasopharyngeal Swab     Status: None   Collection Time: 02/02/20 10:07 PM   Specimen: Nasopharyngeal Swab  Result Value Ref Range Status   SARS Coronavirus 2 NEGATIVE NEGATIVE Final    Comment: (NOTE) SARS-CoV-2 target nucleic acids are NOT DETECTED.  The SARS-CoV-2 RNA is generally detectable in upper and lower respiratory specimens during the acute phase of infection. The lowest concentration of SARS-CoV-2 viral copies this assay can detect is 250 copies / mL. A negative result does not preclude SARS-CoV-2 infection and should not be used as the sole basis for treatment or other patient management decisions.  A negative result may occur with improper specimen collection / handling, submission of specimen other than nasopharyngeal swab, presence of viral mutation(s) within the areas targeted by this assay, and inadequate number of viral copies (<250 copies / mL). A negative result must be combined with clinical observations, patient history, and epidemiological information.  Fact Sheet for Patients:   StrictlyIdeas.no  Fact Sheet for Healthcare Providers: BankingDealers.co.za  This test is not yet approved or  cleared by the [place] FDA and has been authorized for detection and/or diagnosis of SARS-CoV-2 by FDA under an Emergency Use Authorization (EUA).  This EUA will remain in effect (meaning this test can be used) for the duration of the COVID-19 declaration under Section 564(b)(1) of the Act, 21 U.S.C. section 360bbb-3(b)(1), unless the authorization is terminated or revoked sooner.  Performed at [facility], [place] [address] [place], [place] [address]     Coagulation Studies: No results for input(s): LABPROT, INR in the last 72 hours.  Urinalysis: No results for input(s): COLORURINE, LABSPEC, PHURINE, GLUCOSEU, HGBUR,  BILIRUBINUR, KETONESUR, PROTEINUR, UROBILINOGEN, NITRITE, LEUKOCYTESUR in the last 72 hours.  Invalid input(s): APPERANCEUR    Imaging: DG CHEST PORT 1 VIEW  Result Date: 02/12/2020 CLINICAL DATA:  Fever EXAM: PORTABLE CHEST 1 VIEW COMPARISON:  None. FINDINGS: The heart size and mediastinal contours are within normal limits. Both lungs are clear. The visualized skeletal structures are unremarkable. IMPRESSION: No active disease. Electronically Signed   By: Fidela Salisbury MD   On: 02/12/2020 20:14     Medications:   . piperacillin-tazobactam 3.375 g (02/13/20 0543)  .  sodium bicarbonate (isotonic) infusion in sterile water Stopped (02/13/20 0332)   . (feeding supplement) PROSource Plus  30 mL Oral BID BM  . sodium chloride   Intravenous Once  . acetaminophen  1,000 mg Oral Once  . Chlorhexidine Gluconate Cloth  6 each Topical Daily  . diltiazem  60 mg Oral Q8H  . feeding supplement (ENSURE ENLIVE)  237 mL Oral BID BM  . heparin injection (subcutaneous)  5,000 Units Subcutaneous Q8H  . insulin aspart  0-6 Units Subcutaneous Q4H  . insulin glargine  10 Units Subcutaneous Daily  . multivitamin with minerals  1 tablet Oral Daily  . sodium zirconium cyclosilicate  10 g Oral Once  . tamsulosin  0.4 mg Oral Daily   acetaminophen **OR** acetaminophen  Assessment/ Plan:  #Advanced nonoliguric AKI on CKD due to obstructive uropathy in solitary kidney concomitant with hypotension, reduced oral intake: Urinalysis has minimal protein and microscopic hematuria.  CT abdomen pelvis with severely atrophic left kidney and right kidney has moderate hydroureteronephrosis. Urology was consulted and had stent placement on 8/25. Creatinine appears to have stalled around about 6 mg/dL. This may be at baseline.  We will follow over the weekend and consider dialysis if no improvement week of 02/15/2020    #Solitary kidney with right hydronephrosis: Seen by urology and underwent cystoscopy and placement of  double J stent.  #Hyperkalemia.  Patient has been started back on Lokelma will give 10 g 3 times daily.  Start Kayexalate also 02/13/2020.  Repeat potassium later 02/13/2020  #Hypernatremia, hypovolemic: Improved.   #Anion gap metabolic acidosis: This is improved.  We will change IV fluids to normal saline  #Hypertension: Hypertension appears well-controlled   #Failure to thrive/weight loss, severe protein calorie malnutrition: Multiple etiology.  Uremia is contributing as well.  Continue oral supplement and management of renal failure as above.  #Hypophosphatemia:.  Replete with IV sodium phosphate   LOS: Leona @TODAY @7 :32 AM

## 2020-02-14 DIAGNOSIS — R7881 Bacteremia: Secondary | ICD-10-CM

## 2020-02-14 LAB — TYPE AND SCREEN
ABO/RH(D): O POS
Antibody Screen: NEGATIVE
Unit division: 0

## 2020-02-14 LAB — RENAL FUNCTION PANEL
Albumin: 2.4 g/dL — ABNORMAL LOW (ref 3.5–5.0)
Anion gap: 17 — ABNORMAL HIGH (ref 5–15)
BUN: 86 mg/dL — ABNORMAL HIGH (ref 8–23)
CO2: 21 mmol/L — ABNORMAL LOW (ref 22–32)
Calcium: 8.7 mg/dL — ABNORMAL LOW (ref 8.9–10.3)
Chloride: 107 mmol/L (ref 98–111)
Creatinine, Ser: 6.05 mg/dL — ABNORMAL HIGH (ref 0.61–1.24)
GFR calc Af Amer: 9 mL/min — ABNORMAL LOW (ref >60)
GFR calc non Af Amer: 8 mL/min — ABNORMAL LOW (ref >60)
Glucose, Bld: 200 mg/dL — ABNORMAL HIGH (ref 70–99)
Phosphorus: 7.2 mg/dL — ABNORMAL HIGH (ref 2.5–4.6)
Potassium: 4.8 mmol/L (ref 3.5–5.1)
Sodium: 145 mmol/L (ref 135–145)

## 2020-02-14 LAB — IRON AND TIBC
Iron: 14 ug/dL — ABNORMAL LOW (ref 45–182)
Saturation Ratios: 10 % — ABNORMAL LOW (ref 17.9–39.5)
TIBC: 144 ug/dL — ABNORMAL LOW (ref 250–450)
UIBC: 130 ug/dL

## 2020-02-14 LAB — BPAM RBC
Blood Product Expiration Date: 202110042359
ISSUE DATE / TIME: 202109041337
Unit Type and Rh: 5100

## 2020-02-14 LAB — GLUCOSE, CAPILLARY
Glucose-Capillary: 120 mg/dL — ABNORMAL HIGH (ref 70–99)
Glucose-Capillary: 124 mg/dL — ABNORMAL HIGH (ref 70–99)
Glucose-Capillary: 127 mg/dL — ABNORMAL HIGH (ref 70–99)
Glucose-Capillary: 154 mg/dL — ABNORMAL HIGH (ref 70–99)
Glucose-Capillary: 169 mg/dL — ABNORMAL HIGH (ref 70–99)
Glucose-Capillary: 171 mg/dL — ABNORMAL HIGH (ref 70–99)

## 2020-02-14 LAB — POTASSIUM
Potassium: 4.5 mmol/L (ref 3.5–5.1)
Potassium: 4.2 mmol/L (ref 3.5–5.1)
Potassium: 3.5 mmol/L (ref 3.5–5.1)
Potassium: 3.8 mmol/L (ref 3.5–5.1)
Potassium: 4 mmol/L (ref 3.5–5.1)

## 2020-02-14 LAB — PROCALCITONIN: Procalcitonin: 30.99 ng/mL

## 2020-02-14 MED ORDER — PIPERACILLIN-TAZOBACTAM IN DEX 2-0.25 GM/50ML IV SOLN
2.2500 g | Freq: Three times a day (TID) | INTRAVENOUS | Status: DC
  Administered 2020-02-14 – 2020-02-21 (×22): 2.25 g via INTRAVENOUS
  Filled 2020-02-14 (×25): qty 50

## 2020-02-14 MED ORDER — SEVELAMER CARBONATE 800 MG PO TABS
800.0000 mg | ORAL_TABLET | Freq: Three times a day (TID) | ORAL | Status: DC
  Administered 2020-02-14 – 2020-02-21 (×15): 800 mg via ORAL
  Filled 2020-02-14 (×16): qty 1

## 2020-02-14 MED ORDER — DARBEPOETIN ALFA 150 MCG/0.3ML IJ SOSY
150.0000 ug | PREFILLED_SYRINGE | INTRAMUSCULAR | Status: DC
  Administered 2020-02-14: 150 ug via SUBCUTANEOUS
  Filled 2020-02-14 (×2): qty 0.3
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
## 2020-02-14 NOTE — Progress Notes (Signed)
[facility] ROUNDING NOTE   Subjective:   This is a 79 year old male with a history of hypertension diabetes stroke presented with failure to thrive and advanced renal failure with a BUN of 245 and a creatinine of 26 and hyperkalemia.  Renal ultrasound showed a solitary kidney and hydronephrosis.  CT scan of abdomen and pelvis showed right kidney hydronephrosis urology consulted stent placed 02/03/2020 increasing urine output and improved renal function.  Blood loss anemia.  Appreciate assistance of Dr. Wende Crease gastroenterology.  Plan for EGD and colonoscopy once clinically stable  Blood pressure 119/59 pulse 101 temperature 99.2 O2 sats 99% room air urine output 5 L recorded 02/13/2020  Sodium 145 potassium 4.8 chloride 107 CO2 21 BUN 86 creatinine 6.05 glucose 200 calcium 8.7 phosphorus 7.2 albumin 2.4 hemoglobin 7.9.   IV Zosyn 3.375 g every 8 hours    Objective:  Vital signs in last 24 hours:  Temp:  [98.3 F (36.8 C)-99.6 F (37.6 C)] 98.5 F (36.9 C) (09/05 0400) Pulse Rate:  [97-117] 98 (09/05 0000) Resp:  [12-20] 17 (09/05 0400) BP: (117-144)/(59-67) 135/66 (09/05 0400) SpO2:  [96 %-99 %] 98 % (09/05 0400)  Weight change:  Filed Weights   02/03/20 2213 02/05/20 0411 02/07/20 0550  Weight: 66.9 kg 66.5 kg 67.5 kg    Intake/Output: I/O last 3 completed shifts: In: 2252.6 [P.O.:360; I.V.:242.6; IV Piggyback:1650] Out: 1027 [OZDGU:4403]   Intake/Output this shift:  No intake/output data recorded.  General: Alert awake and comfortable Heart:RRR, s1s2 nl, no rubs Lungs: Clear b/l, no crackle Abdomen:soft, Non-tender, non-distended Extremities: No edema Neurology: Alert awake and following commands. Urology: Has Foley catheter and bag has pinkish urine.   Basic Metabolic Panel: Recent Labs  Lab 02/11/20 0254 02/11/20 0254 02/12/20 0821 02/12/20 0821 02/12/20 2013 02/12/20 2243 02/13/20 0130 02/13/20 0130 02/13/20 0553 02/13/20 0956  02/13/20 2014 02/14/20 0342  NA 136  133*   < > 138   < > 137  --  138  --  137  --  144 145  K 4.7  4.6   < > 5.6*   < > 6.7*   < > 6.3*  --  6.4* 6.1* 5.2* 4.8  CL 104  101   < > 102   < > 98  --  99  --  102  --  107 107  CO2 22  22   < > 24   < > 21*  --  22  --  23  --  23 21*  GLUCOSE 141*  139*   < > 167*   < > 193*  --  123*  --  140*  --  131* 200*  BUN 82*  85*   < > 77*   < > 92*  --  94*  --  92*  --  85* 86*  CREATININE 5.13*  5.04*   < > 5.26*   < > 6.05*  --  6.03*  --  5.90*  --  6.10* 6.05*  CALCIUM 8.4*  8.4*   < > 8.4*   < > 8.9   < > 8.9   < > 8.6*  --  8.8* 8.7*  PHOS 3.5  --  3.7  --   --   --  1.8*  --   --   --  7.0* 7.2*   < > = values in this interval not displayed.    Liver Function Tests: Recent Labs  Lab 02/12/20 0821 02/13/20 0130  02/13/20 0956 02/13/20 2014 02/14/20 0342  AST  --   --  33  --   --   ALT  --   --  43  --   --   ALKPHOS  --   --  54  --   --   BILITOT  --   --  0.8  --   --   PROT  --   --  5.8*  --   --   ALBUMIN 2.3* 2.5* 2.3* 2.4* 2.4*   No results for input(s): LIPASE, AMYLASE in the last 168 hours. No results for input(s): AMMONIA in the last 168 hours.  CBC: Recent Labs  Lab 02/11/20 0254 02/12/20 0821 02/13/20 0130 02/13/20 0553 02/13/20 2014  WBC 12.2* 10.3 32.3*  --  27.2*  NEUTROABS 8.1* 6.8 29.2*  --   --   HGB 7.4* 7.6* 6.8* 6.5* 7.9*  HCT 23.2* 23.8* 21.1* 20.0* 24.5*  MCV 92.1 91.9 90.9  --  89.4  PLT 240 276 267  --  256    Cardiac Enzymes: No results for input(s): CKTOTAL, CKMB, CKMBINDEX, TROPONINI in the last 168 hours.  BNP: Invalid input(s): POCBNP  CBG: Recent Labs  Lab 02/13/20 1247 02/13/20 1626 02/13/20 2023 02/14/20 0006 02/14/20 0416  GLUCAP 138* 154* 125* 124* 171*    Microbiology: Results for orders placed or performed during the hospital encounter of 02/02/20  Urine culture     Status: None   Collection Time: 02/02/20 10:05 PM   Specimen: Urine, Random  Result  Value Ref Range Status   Specimen Description URINE, RANDOM  Final   Special Requests NONE  Final   Culture   Final    NO GROWTH Performed at [facility], [place] [address] [place], [place] [address]    Report Status 02/04/2020 FINAL  Final  SARS Coronavirus 2 by RT PCR (hospital order, performed in [facility] hospital lab) Nasopharyngeal Nasopharyngeal Swab     Status: None   Collection Time: 02/02/20 10:07 PM   Specimen: Nasopharyngeal Swab  Result Value Ref Range Status   SARS Coronavirus 2 NEGATIVE NEGATIVE Final    Comment: (NOTE) SARS-CoV-2 target nucleic acids are NOT DETECTED.  The SARS-CoV-2 RNA is generally detectable in upper and lower respiratory specimens during the acute phase of infection. The lowest concentration of SARS-CoV-2 viral copies this assay can detect is 250 copies / mL. A negative result does not preclude SARS-CoV-2 infection and should not be used as the sole basis for treatment or other patient management decisions.  A negative result may occur with improper specimen collection / handling, submission of specimen other than nasopharyngeal swab, presence of viral mutation(s) within the areas targeted by this assay, and inadequate number of viral copies (<250 copies / mL). A negative result must be combined with clinical observations, patient history, and epidemiological information.  Fact Sheet for Patients:   StrictlyIdeas.no  Fact Sheet for Healthcare Providers: BankingDealers.co.za  This test is not yet approved or  cleared by the [place] FDA and has been authorized for detection and/or diagnosis of SARS-CoV-2 by FDA under an Emergency Use Authorization (EUA).  This EUA will remain in effect (meaning this test can be used) for the duration of the COVID-19 declaration under Section 564(b)(1) of the Act, 21 U.S.C. section 360bbb-3(b)(1), unless the authorization is terminated or revoked  sooner.  Performed at [facility], [place] [address] [place], [place] [address]   Culture, blood (routine x 2)  Status: None (Preliminary result)   Collection Time: 02/12/20  8:14 PM   Specimen: BLOOD RIGHT HAND  Result Value Ref Range Status   Specimen Description BLOOD RIGHT HAND  Final   Special Requests   Final    BOTTLES DRAWN AEROBIC ONLY Blood Culture results may not be optimal due to an inadequate volume of blood received in culture bottles   Culture  Setup Time   Final    GRAM NEGATIVE RODS AEROBIC BOTTLE ONLY CRITICAL VALUE NOTED.  VALUE IS CONSISTENT WITH PREVIOUSLY REPORTED AND CALLED VALUE. Performed at [facility], [place] [address], [place], [place] [address]    Culture GRAM NEGATIVE RODS  Final   Report Status PENDING  Incomplete  Culture, blood (routine x 2)     Status: None (Preliminary result)   Collection Time: 02/12/20  8:15 PM   Specimen: BLOOD  Result Value Ref Range Status   Specimen Description BLOOD RIGHT ANTECUBITAL  Final   Special Requests   Final    BOTTLES DRAWN AEROBIC ONLY Blood Culture results may not be optimal due to an inadequate volume of blood received in culture bottles   Culture  Setup Time   Final    GRAM NEGATIVE RODS AEROBIC BOTTLE ONLY CRITICAL RESULT CALLED TO, READ BACK BY AND VERIFIED WITH: Dianna Rossetti MILLEN [phone] FCP Performed at [facility], [place] [address], [place],  [address]    Culture GRAM NEGATIVE RODS  Final   Report Status PENDING  Incomplete  Blood Culture ID Panel (Reflexed)     Status: Abnormal   Collection Time: 02/12/20  8:15 PM  Result Value Ref Range Status   Enterococcus faecalis NOT DETECTED NOT DETECTED Final   Enterococcus Faecium NOT DETECTED NOT DETECTED Final   Listeria monocytogenes NOT DETECTED NOT DETECTED Final   Staphylococcus species NOT DETECTED NOT DETECTED Final   Staphylococcus aureus (BCID) NOT DETECTED NOT DETECTED Final   Staphylococcus epidermidis NOT DETECTED  NOT DETECTED Final   Staphylococcus lugdunensis NOT DETECTED NOT DETECTED Final   Streptococcus species NOT DETECTED NOT DETECTED Final   Streptococcus agalactiae NOT DETECTED NOT DETECTED Final   Streptococcus pneumoniae NOT DETECTED NOT DETECTED Final   Streptococcus pyogenes NOT DETECTED NOT DETECTED Final   A.calcoaceticus-baumannii NOT DETECTED NOT DETECTED Final   Bacteroides fragilis NOT DETECTED NOT DETECTED Final   Enterobacterales DETECTED (A) NOT DETECTED Final    Comment: Enterobacterales represent a large order of gram negative bacteria, not a single organism. Refer to culture for further identification. RBV PHARMD J MILLEN [phone] FCP    Enterobacter cloacae complex NOT DETECTED NOT DETECTED Final   Escherichia coli NOT DETECTED NOT DETECTED Final   Klebsiella aerogenes NOT DETECTED NOT DETECTED Final   Klebsiella oxytoca NOT DETECTED NOT DETECTED Final   Klebsiella pneumoniae NOT DETECTED NOT DETECTED Final   Proteus species NOT DETECTED NOT DETECTED Final   Salmonella species NOT DETECTED NOT DETECTED Final   Serratia marcescens NOT DETECTED NOT DETECTED Final   Haemophilus influenzae NOT DETECTED NOT DETECTED Final   Neisseria meningitidis NOT DETECTED NOT DETECTED Final   Pseudomonas aeruginosa NOT DETECTED NOT DETECTED Final   Stenotrophomonas maltophilia NOT DETECTED NOT DETECTED Final   Candida albicans NOT DETECTED NOT DETECTED Final   Candida auris NOT DETECTED NOT DETECTED Final   Candida glabrata NOT DETECTED NOT DETECTED Final   Candida krusei NOT DETECTED NOT DETECTED Final   Candida parapsilosis NOT DETECTED NOT DETECTED Final  Candida tropicalis NOT DETECTED NOT DETECTED Final   Cryptococcus neoformans/gattii NOT DETECTED NOT DETECTED Final   CTX-M ESBL NOT DETECTED NOT DETECTED Final   Carbapenem resistance IMP NOT DETECTED NOT DETECTED Final   Carbapenem resistance KPC NOT DETECTED NOT DETECTED Final   Carbapenem resistance NDM NOT DETECTED NOT  DETECTED Final   Carbapenem resist OXA 48 LIKE NOT DETECTED NOT DETECTED Final   Carbapenem resistance VIM NOT DETECTED NOT DETECTED Final    Comment: Performed at [facility], [place] [address] [place], [place] [address]    Coagulation Studies: No results for input(s): LABPROT, INR in the last 72 hours.  Urinalysis: Recent Labs    02/13/20 1530  COLORURINE YELLOW  LABSPEC 1.006  PHURINE 9.0*  GLUCOSEU 150*  HGBUR MODERATE*  BILIRUBINUR NEGATIVE  KETONESUR NEGATIVE  PROTEINUR 100*  NITRITE NEGATIVE  LEUKOCYTESUR LARGE*      Imaging: DG CHEST PORT 1 VIEW  Result Date: 02/12/2020 CLINICAL DATA:  Fever EXAM: PORTABLE CHEST 1 VIEW COMPARISON:  None. FINDINGS: The heart size and mediastinal contours are within normal limits. Both lungs are clear. The visualized skeletal structures are unremarkable. IMPRESSION: No active disease. Electronically Signed   By: Fidela Salisbury MD   On: 02/12/2020 20:14     Medications:   . sodium chloride 50 mL/hr at 02/13/20 1508  . piperacillin-tazobactam 3.375 g (02/14/20 0506)   . (feeding supplement) PROSource Plus  30 mL Oral BID BM  . Chlorhexidine Gluconate Cloth  6 each Topical Daily  . diltiazem  60 mg Oral Q8H  . feeding supplement (ENSURE ENLIVE)  237 mL Oral BID BM  . insulin aspart  0-6 Units Subcutaneous Q4H  . insulin glargine  10 Units Subcutaneous Daily  . multivitamin with minerals  1 tablet Oral Daily  . sodium zirconium cyclosilicate  10 g Oral Once  . sodium zirconium cyclosilicate  10 g Oral TID  . tamsulosin  0.4 mg Oral Daily   acetaminophen **OR** acetaminophen  Assessment/ Plan:  #Advanced nonoliguric AKI on CKD due to obstructive uropathy in solitary kidney concomitant with hypotension, reduced oral intake: Urinalysis has minimal protein and microscopic hematuria.  CT abdomen pelvis with severely atrophic left kidney and right kidney has moderate hydroureteronephrosis. Urology was consulted and had stent  placement on 8/25. Creatinine appears to have stalled around about 6 mg/dL. This may be at baseline.  We will follow over the weekend and consider dialysis if no improvement week of 02/15/2020    #Solitary kidney with right hydronephrosis: Seen by urology and underwent cystoscopy and placement of double J stent.  #Hyperkalemia.  Potassium improved.  Will hold Lokelma at this point  #Hypernatremia, hypovolemic: Improved.   #Anion gap metabolic acidosis: This is improved  #Hypertension: Hypertension appears well-controlled   #Failure to thrive/weight loss, severe protein calorie malnutrition: Multiple etiology.  Uremia is contributing as well.  Continue oral supplement and management of renal failure as above.  #Hyperphosphatemia we will start binders Renvela 800 mg with meals.  Will check PTH.  #Anemia acute blood loss anemia being followed by gastroenterology.  Plan is EGD and colonoscopy at some point.  We will check iron studies and start darbepoetin.   LOS: [place] @TODAY @7 :05 AM

## 2020-02-14 NOTE — Progress Notes (Signed)
Pharmacy Antibiotic Note  Paul Bond is a 79 y.o. male admitted on 02/02/2020 with sepsis.  Pharmacy has been consulted for Zosyn dosing.  Patient is afebrile but with a large increase in WBC to 27.2. AKI on CKD with potential for HD if renal function does not improve.   Plan: Decrease Zosyn to 2.25g IV every 8 hours Monitor renal function, plan for HD, clinical status, and LOT  Height: 5\' 7"  (170.2 cm) Weight: 67.5 kg (148 lb 13 oz) IBW/kg (Calculated) : 66.1  Temp (24hrs), Avg:98.8 F (37.1 C), Min:98.3 F (36.8 C), Max:99.6 F (37.6 C)  Recent Labs  Lab 02/11/20 0254 02/11/20 0254 02/12/20 0821 02/12/20 0821 02/12/20 2013 02/13/20 0130 02/13/20 0553 02/13/20 2014 02/14/20 0342  WBC 12.2*  --  10.3  --   --  32.3*  --  27.2*  --   CREATININE 5.13*  5.04*   < > 5.26*   < > 6.05* 6.03* 5.90* 6.10* 6.05*  LATICACIDVEN  --   --   --   --   --  5.3* 2.0*  --   --    < > = values in this interval not displayed.    Estimated Creatinine Clearance: 9.3 mL/min (A) (by C-G formula based on SCr of 6.05 mg/dL (H)).    No Known Allergies  Antimicrobials this admission: Zosyn 9/4 >>   Microbiology results:  9/3 Bcx >> GNR in 2of4 bottles BCID with Enterobacter - no speciation. no resistance detected 9/3 Ucx >>  8/24 Ucx >> neg  Thank you for allowing pharmacy to be a part of this patient's care.  Romilda Garret, PharmD PGY1 Acute Care Pharmacy Resident Phone: [phone] 02/14/2020 8:14 AM  Please check [email or web address] for unit specific pharmacy phone numbers.

## 2020-02-15 DIAGNOSIS — E870 Hyperosmolality and hypernatremia: Secondary | ICD-10-CM

## 2020-02-15 LAB — GLUCOSE, CAPILLARY
Glucose-Capillary: 118 mg/dL — ABNORMAL HIGH (ref 70–99)
Glucose-Capillary: 120 mg/dL — ABNORMAL HIGH (ref 70–99)
Glucose-Capillary: 151 mg/dL — ABNORMAL HIGH (ref 70–99)
Glucose-Capillary: 192 mg/dL — ABNORMAL HIGH (ref 70–99)
Glucose-Capillary: 192 mg/dL — ABNORMAL HIGH (ref 70–99)
Glucose-Capillary: 251 mg/dL — ABNORMAL HIGH (ref 70–99)

## 2020-02-15 LAB — PROCALCITONIN: Procalcitonin: 20.39 ng/mL

## 2020-02-15 LAB — CBC WITH DIFFERENTIAL/PLATELET
Abs Immature Granulocytes: 0.17 10*3/uL — ABNORMAL HIGH (ref 0.00–0.07)
Basophils Absolute: 0 10*3/uL (ref 0.0–0.1)
Basophils Relative: 0 %
Eosinophils Absolute: 0.2 10*3/uL (ref 0.0–0.5)
Eosinophils Relative: 1 %
HCT: 25.4 % — ABNORMAL LOW (ref 39.0–52.0)
Hemoglobin: 8 g/dL — ABNORMAL LOW (ref 13.0–17.0)
Immature Granulocytes: 1 %
Lymphocytes Relative: 4 %
Lymphs Abs: 0.6 10*3/uL — ABNORMAL LOW (ref 0.7–4.0)
MCH: 28.8 pg (ref 26.0–34.0)
MCHC: 31.5 g/dL (ref 30.0–36.0)
MCV: 91.4 fL (ref 80.0–100.0)
Monocytes Absolute: 1 10*3/uL (ref 0.1–1.0)
Monocytes Relative: 5 %
Neutro Abs: 15.7 10*3/uL — ABNORMAL HIGH (ref 1.7–7.7)
Neutrophils Relative %: 89 %
Platelets: 265 10*3/uL (ref 150–400)
RBC: 2.78 MIL/uL — ABNORMAL LOW (ref 4.22–5.81)
RDW: 16.9 % — ABNORMAL HIGH (ref 11.5–15.5)
WBC: 17.7 10*3/uL — ABNORMAL HIGH (ref 4.0–10.5)
nRBC: 0 % (ref 0.0–0.2)

## 2020-02-15 LAB — RENAL FUNCTION PANEL
Albumin: 2.1 g/dL — ABNORMAL LOW (ref 3.5–5.0)
Anion gap: 15 (ref 5–15)
BUN: 83 mg/dL — ABNORMAL HIGH (ref 8–23)
CO2: 22 mmol/L (ref 22–32)
Calcium: 8.2 mg/dL — ABNORMAL LOW (ref 8.9–10.3)
Chloride: 110 mmol/L (ref 98–111)
Creatinine, Ser: 5.86 mg/dL — ABNORMAL HIGH (ref 0.61–1.24)
GFR calc Af Amer: 10 mL/min — ABNORMAL LOW (ref >60)
GFR calc non Af Amer: 8 mL/min — ABNORMAL LOW (ref >60)
Glucose, Bld: 155 mg/dL — ABNORMAL HIGH (ref 70–99)
Phosphorus: 6.7 mg/dL — ABNORMAL HIGH (ref 2.5–4.6)
Potassium: 3.7 mmol/L (ref 3.5–5.1)
Sodium: 147 mmol/L — ABNORMAL HIGH (ref 135–145)

## 2020-02-15 LAB — CULTURE, BLOOD (ROUTINE X 2)

## 2020-02-15 LAB — URINE CULTURE: Culture: >=100000 — AB

## 2020-02-15 MED ORDER — SODIUM CHLORIDE 0.45 % IV SOLN: INTRAVENOUS | Status: AC

## 2020-02-15 NOTE — Progress Notes (Signed)
Urology Inpatient Progress Report    Procedure(s): CYSTOSCOPY WITH RETROGRADE PYELOGRAM/URETERAL DOUBLE J STENT PLACEMENT  12 Days Post-Op   Intv/Subj: No acute events overnight. Patient is without complaint. Patient failed his voiding trial.  Foley is now back in.  He has good urine output.  Creatinine remains elevated as 5.86.  He is being followed by nephrology.  Remains on Zosyn.  Blood culture on 9/3 revealed Morganella sensitive to Zosyn.   Current Facility-Administered Medications  Medication Dose Route Frequency Provider Last Rate Last Admin  . (feeding supplement) PROSource Plus liquid 30 mL  30 mL Oral BID BM Georgette Shell, MD   30 mL at 02/15/20 0839  . 0.45 % sodium chloride infusion   Intravenous Continuous Madelon Lips, MD 75 mL/hr at 02/15/20 1111 New Bag at 02/15/20 1111  . acetaminophen (TYLENOL) tablet 650 mg  650 mg Oral Q6H PRN [name], MD   650 mg at 02/12/20 0035   Or  . acetaminophen (TYLENOL) suppository 650 mg  650 mg Rectal Q6H PRN [name], MD   650 mg at 02/12/20 1941  . Chlorhexidine Gluconate Cloth 2 % PADS 6 each  6 each Topical Daily Georgette Shell, MD   6 each at 02/15/20 [id number]  . Darbepoetin Alfa (ARANESP) injection 150 mcg  150 mcg Subcutaneous Q Sun-1800 Edrick Oh, MD   150 mcg at 02/14/20 1954  . diltiazem (CARDIZEM) tablet 60 mg  60 mg Oral Q8H Dahal, Marlowe Aschoff, MD   60 mg at 02/15/20 0838  . feeding supplement (ENSURE ENLIVE) (ENSURE ENLIVE) liquid 237 mL  237 mL Oral BID BM Georgette Shell, MD   237 mL at 02/15/20 0839  . insulin aspart (novoLOG) injection 0-6 Units  0-6 Units Subcutaneous Q4H [name], MD   1 Units at 02/15/20 0056  . insulin glargine (LANTUS) injection 10 Units  10 Units Subcutaneous Daily Terrilee Croak, MD   10 Units at 02/15/20 0839  . multivitamin with minerals tablet 1 tablet  1 tablet Oral Daily Georgette Shell, MD   1 tablet at 02/15/20 [phone]  .  piperacillin-tazobactam (ZOSYN) IVPB 2.25 g  2.25 g Intravenous Q8H Romilda Garret, RPH 100 mL/hr at 02/15/20 0623 2.25 g at 02/15/20 5852  . sevelamer carbonate (RENVELA) tablet 800 mg  800 mg Oral TID WC Edrick Oh, MD   800 mg at 02/15/20 7782  . sodium zirconium cyclosilicate (LOKELMA) packet 10 g  10 g Oral Once Zierle-Ghosh, Asia B, DO      . tamsulosin (FLOMAX) capsule 0.4 mg  0.4 mg Oral Daily [name], MD   0.4 mg at 02/15/20 0839     Objective: Vital: Vitals:   02/15/20 0435 02/15/20 0747 02/15/20 0800 02/15/20 1121  BP: 136/68  129/68   Pulse: 75  88   Resp: 10  15   Temp: 98.4 F (36.9 C) 98 F (36.7 C)  98 F (36.7 C)  TempSrc: Oral Oral  Oral  SpO2: 100%  100%   Weight:      Height:       I/Os: I/O last 3 completed shifts: In: 1899.8 [P.O.:837; I.V.:912.8; IV Piggyback:150] Out: 5400 [Urine:5400]  Physical Exam:  General: Patient is in no apparent distress Lungs: Normal respiratory effort, chest expands symmetrically. GI: The abdomen is soft and nontender without mass. Foley: Draining clear yellow urine Ext: lower extremities symmetric  Lab Results: Recent Labs    02/13/20 0130 02/13/20 0130 02/13/20 0553 02/13/20 2014 02/15/20 1028  WBC  32.3*  --   --  27.2* 17.7*  HGB 6.8*   < > 6.5* 7.9* 8.0*  HCT 21.1*   < > 20.0* 24.5* 25.4*   < > = values in this interval not displayed.   Recent Labs    02/13/20 2014 02/13/20 2014 02/14/20 0342 02/14/20 0657 02/14/20 1815 02/14/20 2150 02/15/20 0219  NA 144  --  145  --   --   --  147*  K 5.2*   < > 4.8   < > 3.8 4.0 3.7  CL 107  --  107  --   --   --  110  CO2 23  --  21*  --   --   --  22  GLUCOSE 131*  --  200*  --   --   --  155*  BUN 85*  --  86*  --   --   --  83*  CREATININE 6.10*  --  6.05*  --   --   --  5.86*  CALCIUM 8.8*  --  8.7*  --   --   --  8.2*   < > = values in this interval not displayed.   No results for input(s): LABPT, INR in the last 72 hours. No results for  input(s): LABURIN in the last 72 hours. Results for orders placed or performed during the hospital encounter of 02/02/20  Urine culture     Status: None   Collection Time: 02/02/20 10:05 PM   Specimen: Urine, Random  Result Value Ref Range Status   Specimen Description URINE, RANDOM  Final   Special Requests NONE  Final   Culture   Final    NO GROWTH Performed at [facility], [address] [place], [place] [address]    Report Status 02/04/2020 FINAL  Final  SARS Coronavirus 2 by RT PCR (hospital order, performed in [facility] hospital lab) Nasopharyngeal Nasopharyngeal Swab     Status: None   Collection Time: 02/02/20 10:07 PM   Specimen: Nasopharyngeal Swab  Result Value Ref Range Status   SARS Coronavirus 2 NEGATIVE NEGATIVE Final    Comment: (NOTE) SARS-CoV-2 target nucleic acids are NOT DETECTED.  The SARS-CoV-2 RNA is generally detectable in upper and lower respiratory specimens during the acute phase of infection. The lowest concentration of SARS-CoV-2 viral copies this assay can detect is 250 copies / mL. A negative result does not preclude SARS-CoV-2 infection and should not be used as the sole basis for treatment or other patient management decisions.  A negative result may occur with improper specimen collection / handling, submission of specimen other than nasopharyngeal swab, presence of viral mutation(s) within the areas targeted by this assay, and inadequate number of viral copies (<250 copies / mL). A negative result must be combined with clinical observations, patient history, and epidemiological information.  Fact Sheet for Patients:   StrictlyIdeas.no  Fact Sheet for Healthcare Providers: BankingDealers.co.za  This test is not yet approved or  cleared by the [place] FDA and has been authorized for detection and/or diagnosis of SARS-CoV-2 by FDA under an Emergency Use Authorization (EUA).  This EUA  will remain in effect (meaning this test can be used) for the duration of the COVID-19 declaration under Section 564(b)(1) of the Act, 21 U.S.C. section 360bbb-3(b)(1), unless the authorization is terminated or revoked sooner.  Performed at [facility], [place] [address] [place], [place] [address]   Culture, Urine  Status: Abnormal   Collection Time: 02/12/20  1:43 AM   Specimen: Urine, Random  Result Value Ref Range Status   Specimen Description URINE, RANDOM  Final   Special Requests   Final    NONE Performed at [facility], [address], [place], [place] [address]    Culture >=100,000 COLONIES/mL [facility] MORGANII (A)  Final   Report Status 02/15/2020 FINAL  Final   Organism ID, Bacteria MORGANELLA MORGANII (A)  Final      Susceptibility   Morganella morganii - MIC*    AMPICILLIN >=32 RESISTANT Resistant     CEFAZOLIN >=64 RESISTANT Resistant     CIPROFLOXACIN 1 SENSITIVE Sensitive     GENTAMICIN <=1 SENSITIVE Sensitive     IMIPENEM 1 SENSITIVE Sensitive     NITROFURANTOIN RESISTANT Resistant     TRIMETH/SULFA <=20 SENSITIVE Sensitive     AMPICILLIN/SULBACTAM >=32 RESISTANT Resistant     PIP/TAZO <=4 SENSITIVE Sensitive     * >=100,000 COLONIES/mL MORGANELLA MORGANII  Culture, blood (routine x 2)     Status: Abnormal   Collection Time: 02/12/20  8:14 PM   Specimen: BLOOD RIGHT HAND  Result Value Ref Range Status   Specimen Description BLOOD RIGHT HAND  Final   Special Requests   Final    BOTTLES DRAWN AEROBIC ONLY Blood Culture results may not be optimal due to an inadequate volume of blood received in culture bottles   Culture  Setup Time   Final    GRAM NEGATIVE RODS AEROBIC BOTTLE ONLY CRITICAL VALUE NOTED.  VALUE IS CONSISTENT WITH PREVIOUSLY REPORTED AND CALLED VALUE.    Culture (A)  Final    MORGANELLA MORGANII SUSCEPTIBILITIES PERFORMED ON PREVIOUS CULTURE WITHIN THE LAST 5 DAYS. Performed at [facility], [address]  [place], [place] [address]    Report Status 02/15/2020 FINAL  Final  Culture, blood (routine x 2)     Status: Abnormal   Collection Time: 02/12/20  8:15 PM   Specimen: BLOOD  Result Value Ref Range Status   Specimen Description BLOOD RIGHT ANTECUBITAL  Final   Special Requests   Final    BOTTLES DRAWN AEROBIC ONLY Blood Culture results may not be optimal due to an inadequate volume of blood received in culture bottles   Culture  Setup Time   Final    GRAM NEGATIVE RODS AEROBIC BOTTLE ONLY CRITICAL RESULT CALLED TO, READ BACK BY AND VERIFIED WITH: PHARMD J MILLEN [id number] [id number] FCP    Culture MORGANELLA MORGANII (A)  Final   Report Status 02/15/2020 FINAL  Final   Organism ID, Bacteria MORGANELLA MORGANII  Final      Susceptibility   Morganella morganii - MIC*    AMPICILLIN >=32 RESISTANT Resistant     CEFAZOLIN >=64 RESISTANT Resistant     CEFTAZIDIME <=1 SENSITIVE Sensitive     CIPROFLOXACIN 1 SENSITIVE Sensitive     GENTAMICIN <=1 SENSITIVE Sensitive     IMIPENEM 1 SENSITIVE Sensitive     TRIMETH/SULFA <=20 SENSITIVE Sensitive     AMPICILLIN/SULBACTAM >=32 RESISTANT Resistant     PIP/TAZO <=4 SENSITIVE Sensitive     * MORGANELLA MORGANII  Blood Culture ID Panel (Reflexed)     Status: Abnormal   Collection Time: 02/12/20  8:15 PM  Result Value Ref Range Status   Enterococcus faecalis NOT DETECTED NOT DETECTED Final   Enterococcus Faecium NOT DETECTED NOT DETECTED Final   Listeria monocytogenes NOT DETECTED NOT DETECTED Final   Staphylococcus species  NOT DETECTED NOT DETECTED Final   Staphylococcus aureus (BCID) NOT DETECTED NOT DETECTED Final   Staphylococcus epidermidis NOT DETECTED NOT DETECTED Final   Staphylococcus lugdunensis NOT DETECTED NOT DETECTED Final   Streptococcus species NOT DETECTED NOT DETECTED Final   Streptococcus agalactiae NOT DETECTED NOT DETECTED Final   Streptococcus pneumoniae NOT DETECTED NOT DETECTED Final   Streptococcus pyogenes NOT DETECTED NOT  DETECTED Final   A.calcoaceticus-baumannii NOT DETECTED NOT DETECTED Final   Bacteroides fragilis NOT DETECTED NOT DETECTED Final   Enterobacterales DETECTED (A) NOT DETECTED Final    Comment: Enterobacterales represent a large order of gram negative bacteria, not a single organism. Refer to culture for further identification. RBV PHARMD J MILLEN [phone] FCP    Enterobacter cloacae complex NOT DETECTED NOT DETECTED Final   Escherichia coli NOT DETECTED NOT DETECTED Final   Klebsiella aerogenes NOT DETECTED NOT DETECTED Final   Klebsiella oxytoca NOT DETECTED NOT DETECTED Final   Klebsiella pneumoniae NOT DETECTED NOT DETECTED Final   Proteus species NOT DETECTED NOT DETECTED Final   Salmonella species NOT DETECTED NOT DETECTED Final   Serratia marcescens NOT DETECTED NOT DETECTED Final   Haemophilus influenzae NOT DETECTED NOT DETECTED Final   Neisseria meningitidis NOT DETECTED NOT DETECTED Final   Pseudomonas aeruginosa NOT DETECTED NOT DETECTED Final   Stenotrophomonas maltophilia NOT DETECTED NOT DETECTED Final   Candida albicans NOT DETECTED NOT DETECTED Final   Candida auris NOT DETECTED NOT DETECTED Final   Candida glabrata NOT DETECTED NOT DETECTED Final   Candida krusei NOT DETECTED NOT DETECTED Final   Candida parapsilosis NOT DETECTED NOT DETECTED Final   Candida tropicalis NOT DETECTED NOT DETECTED Final   Cryptococcus neoformans/gattii NOT DETECTED NOT DETECTED Final   CTX-M ESBL NOT DETECTED NOT DETECTED Final   Carbapenem resistance IMP NOT DETECTED NOT DETECTED Final   Carbapenem resistance KPC NOT DETECTED NOT DETECTED Final   Carbapenem resistance NDM NOT DETECTED NOT DETECTED Final   Carbapenem resist OXA 48 LIKE NOT DETECTED NOT DETECTED Final   Carbapenem resistance VIM NOT DETECTED NOT DETECTED Final    Comment: Performed at [facility], [place] [address], [place], [place] [address]    Studies/Results: No results found.  Assessment: Solitary right  kidney Right ureteral calculus Ureteral obstruction secondary to calculus Acute renal insufficiency Urinary retention Bacteremia secondary to UTI  Procedure(s): CYSTOSCOPY WITH RETROGRADE PYELOGRAM/URETERAL DOUBLE J STENT PLACEMENT, 12 Days Post-Op  doing well.  Plan: Continue Foley catheter for now.  Continue to follow renal function.  Appreciate nephrology input.  Continue antibiotics for his bacteremia.  He will eventually need outpatient follow-up for right ureteroscopy with laser lithotripsy and ureteral stent placement.  Continue Flomax.   Link Snuffer, MD Urology 02/15/2020, 11:59 AM
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
## 2020-02-15 NOTE — Progress Notes (Signed)
[facility] Progress Note    Assessment/ Plan:   #Advanced nonoliguric AKI on CKD due to obstructive uropathy in solitary kidney concomitant with hypotension, reduced oral intake: Urinalysis has minimal protein and microscopic hematuria. CT abdomen pelvis with severely atrophic left kidney and right kidney has moderate hydroureteronephrosis. Urology was consulted and had stent placement on 8/25. Creatinine appears to have stalled around about 6 mg/dL. Na is up a Iafrate and has robust UOP. Will change IVFs to 1/2 NS and follow.     #Solitary kidney with right hydronephrosis: Seen by urology and underwent cystoscopy and placement of double J stent.  #Hyperkalemia.  Potassium improved, off Lokelma  #Hypernatremia, hypovolemic:Improved.   #Anion gap metabolic acidosis: This is improved  #Hypertension: Hypertension appears well-controlled   #Failure to thrive/weight loss, severe protein calorie malnutrition: Multiple etiology.  Continue oral supplement and management of renal failure as above.  #Hyperphosphatemia we will start binders Renvela 800 mg with meals.  Will check PTH.  #Anemia acute blood loss anemia being followed by gastroenterology.  Plan is EGD and colonoscopy at some point.  We will check iron studies and start darbepoetin.  Subjective:    No acute events.  Feeling well.  Still with robust UOP, Na up higher.     Objective:   BP 129/68   Pulse 88   Temp 98 F (36.7 C) (Oral)   Resp 15   Ht 5\' 7"  (1.702 m)   Wt 67.5 kg   SpO2 100%   BMI 23.31 kg/m   Intake/Output Summary (Last 24 hours) at 02/15/2020 1101 Last data filed at 02/15/2020 0915 Gross per 24 hour  Intake 1727.22 ml  Output 3325 ml  Net -1597.78 ml   Weight change:   Physical Exam: Gen: NAD, sitting in bed CVS: RRR Resp: clear Abd: nontender Ext: no LE edema  Imaging: No results found.  Labs: [employer] Recent Labs  Lab 02/10/20 0318 02/10/20 0318 02/11/20 0254  02/11/20 0254 02/12/20 0821 02/12/20 0821 02/12/20 2013 02/12/20 2243 02/13/20 0130 02/13/20 0130 02/13/20 0553 02/13/20 0956 02/13/20 2014 02/13/20 2014 02/14/20 0342 02/14/20 0657 02/14/20 1127 02/14/20 1429 02/14/20 1815 02/14/20 2150 02/15/20 0219  NA 139   < > 136  133*   < > 138  --  137  --  138  --  137  --  144  --  145  --   --   --   --   --  147*  K 4.7   < > 4.7  4.6   < > 5.6*   < > 6.7*   < > 6.3*   < > 6.4*   < > 5.2*   < > 4.8 4.5 4.2 3.5 3.8 4.0 3.7  CL 110   < > 104  101   < > 102  --  98  --  99  --  102  --  107  --  107  --   --   --   --   --  110  CO2 16*   < > 22  22   < > 24  --  21*  --  22  --  23  --  23  --  21*  --   --   --   --   --  22  GLUCOSE 128*   < > 141*  139*   < > 167*  --  193*  --  123*  --  140*  --  131*  --  200*  --   --   --   --   --  155*  BUN 83*   < > 82*  85*   < > 77*  --  92*  --  94*  --  92*  --  85*  --  86*  --   --   --   --   --  83*  CREATININE 5.56*   < > 5.13*  5.04*   < > 5.26*  --  6.05*  --  6.03*  --  5.90*  --  6.10*  --  6.05*  --   --   --   --   --  5.86*  CALCIUM 8.3*   < > 8.4*  8.4*   < > 8.4*  --  8.9  --  8.9  --  8.6*  --  8.8*  --  8.7*  --   --   --   --   --  8.2*  PHOS 3.6  --  3.5  --  3.7  --   --   --  1.8*  --   --   --  7.0*  --  7.2*  --   --   --   --   --  6.7*   < > = values in this interval not displayed.   CBC Recent Labs  Lab 02/11/20 0254 02/11/20 0254 02/12/20 0821 02/12/20 0821 02/13/20 0130 02/13/20 0553 02/13/20 2014 02/15/20 1028  WBC 12.2*   < > 10.3  --  32.3*  --  27.2* 17.7*  NEUTROABS 8.1*  --  6.8  --  29.2*  --   --  15.7*  HGB 7.4*   < > 7.6*   < > 6.8* 6.5* 7.9* 8.0*  HCT 23.2*   < > 23.8*   < > 21.1* 20.0* 24.5* 25.4*  MCV 92.1   < > 91.9  --  90.9  --  89.4 91.4  PLT 240   < > 276  --  267  --  256 265   < > = values in this interval not displayed.    Medications:    . (feeding supplement) PROSource Plus  30 mL Oral BID BM  . Chlorhexidine  Gluconate Cloth  6 each Topical Daily  . darbepoetin (ARANESP) injection - NON-DIALYSIS  150 mcg Subcutaneous Q Sun-1800  . diltiazem  60 mg Oral Q8H  . feeding supplement (ENSURE ENLIVE)  237 mL Oral BID BM  . insulin aspart  0-6 Units Subcutaneous Q4H  . insulin glargine  10 Units Subcutaneous Daily  . multivitamin with minerals  1 tablet Oral Daily  . sevelamer carbonate  800 mg Oral TID WC  . sodium zirconium cyclosilicate  10 g Oral Once  . tamsulosin  0.4 mg Oral Daily      Madelon Lips, MD 02/15/2020, 11:01 AM

## 2020-02-16 DIAGNOSIS — D509 Iron deficiency anemia, unspecified: Secondary | ICD-10-CM

## 2020-02-16 LAB — RENAL FUNCTION PANEL
Albumin: 2.1 g/dL — ABNORMAL LOW (ref 3.5–5.0)
Anion gap: 12 (ref 5–15)
BUN: 77 mg/dL — ABNORMAL HIGH (ref 8–23)
CO2: 22 mmol/L (ref 22–32)
Calcium: 8 mg/dL — ABNORMAL LOW (ref 8.9–10.3)
Chloride: 103 mmol/L (ref 98–111)
Creatinine, Ser: 5.37 mg/dL — ABNORMAL HIGH (ref 0.61–1.24)
GFR calc Af Amer: 11 mL/min — ABNORMAL LOW (ref >60)
GFR calc non Af Amer: 9 mL/min — ABNORMAL LOW (ref >60)
Glucose, Bld: 118 mg/dL — ABNORMAL HIGH (ref 70–99)
Phosphorus: 5.3 mg/dL — ABNORMAL HIGH (ref 2.5–4.6)
Potassium: 3.7 mmol/L (ref 3.5–5.1)
Sodium: 137 mmol/L (ref 135–145)

## 2020-02-16 LAB — CBC WITH DIFFERENTIAL/PLATELET
Abs Immature Granulocytes: 0.1 10*3/uL — ABNORMAL HIGH (ref 0.00–0.07)
Basophils Absolute: 0 10*3/uL (ref 0.0–0.1)
Basophils Relative: 0 %
Eosinophils Absolute: 0.2 10*3/uL (ref 0.0–0.5)
Eosinophils Relative: 2 %
HCT: 25.3 % — ABNORMAL LOW (ref 39.0–52.0)
Hemoglobin: 8 g/dL — ABNORMAL LOW (ref 13.0–17.0)
Immature Granulocytes: 1 %
Lymphocytes Relative: 8 %
Lymphs Abs: 1 10*3/uL (ref 0.7–4.0)
MCH: 29.5 pg (ref 26.0–34.0)
MCHC: 31.6 g/dL (ref 30.0–36.0)
MCV: 93.4 fL (ref 80.0–100.0)
Monocytes Absolute: 0.9 10*3/uL (ref 0.1–1.0)
Monocytes Relative: 7 %
Neutro Abs: 10.1 10*3/uL — ABNORMAL HIGH (ref 1.7–7.7)
Neutrophils Relative %: 82 %
Platelets: 272 10*3/uL (ref 150–400)
RBC: 2.71 MIL/uL — ABNORMAL LOW (ref 4.22–5.81)
RDW: 16.2 % — ABNORMAL HIGH (ref 11.5–15.5)
WBC: 12.3 10*3/uL — ABNORMAL HIGH (ref 4.0–10.5)
nRBC: 0 % (ref 0.0–0.2)

## 2020-02-16 LAB — GLUCOSE, CAPILLARY
Glucose-Capillary: 113 mg/dL — ABNORMAL HIGH (ref 70–99)
Glucose-Capillary: 142 mg/dL — ABNORMAL HIGH (ref 70–99)
Glucose-Capillary: 177 mg/dL — ABNORMAL HIGH (ref 70–99)
Glucose-Capillary: 178 mg/dL — ABNORMAL HIGH (ref 70–99)
Glucose-Capillary: 181 mg/dL — ABNORMAL HIGH (ref 70–99)
Glucose-Capillary: 188 mg/dL — ABNORMAL HIGH (ref 70–99)
Glucose-Capillary: 189 mg/dL — ABNORMAL HIGH (ref 70–99)
Glucose-Capillary: 96 mg/dL (ref 70–99)

## 2020-02-16 LAB — PARATHYROID HORMONE, INTACT (NO CA): PTH: 208 pg/mL — ABNORMAL HIGH (ref 15–65)

## 2020-02-16 MED ORDER — BISACODYL 5 MG PO TBEC
5.0000 mg | DELAYED_RELEASE_TABLET | Freq: Once | ORAL | Status: AC
  Administered 2020-02-16: 5 mg via ORAL
  Filled 2020-02-16: qty 1

## 2020-02-16 MED ORDER — SODIUM CHLORIDE 0.45 % IV SOLN: INTRAVENOUS | Status: AC

## 2020-02-16 MED ORDER — BISACODYL 5 MG PO TBEC: 20.0000 mg | DELAYED_RELEASE_TABLET | Freq: Once | ORAL | Status: DC

## 2020-02-16 MED ORDER — BISACODYL 5 MG PO TBEC
20.0000 mg | DELAYED_RELEASE_TABLET | Freq: Once | ORAL | Status: AC
  Administered 2020-02-17: 20 mg via ORAL
  Filled 2020-02-16: qty 4

## 2020-02-16 MED ORDER — POLYETHYLENE GLYCOL 3350 17 G PO PACK
17.0000 g | PACK | Freq: Once | ORAL | Status: AC
  Administered 2020-02-16: 17 g via ORAL
  Filled 2020-02-16: qty 1

## 2020-02-16 NOTE — Progress Notes (Signed)
Gastroenterology Inpatient Follow-up Note   PATIENT IDENTIFICATION  Paul Bond is a 79 y.o. male with a pmh significant for prior HCC, DM, HTN, chronic renal insufficiency who presented with acute on chronic renal insufficiency with urosepsis and also found to have iron deficiency anemia. Hospital Day: 15  SUBJECTIVE  Patient creatinine is stable. Leukocytosis continues to improve. Patient has minimal abdominal discomfort. Patient thinks he will be ready for endoscopic evaluation in the next 1 to 2 days. It does not look like there will be further urology procedures as an inpatient. Patient denies any fevers or chills. Abdominal pain is present but improving. No nausea or vomiting.   OBJECTIVE  Scheduled Inpatient Medications:  . (feeding supplement) PROSource Plus  30 mL Oral BID BM  . [START ON 02/17/2020] bisacodyl  20 mg Oral Once  . bisacodyl  5 mg Oral Once  . Chlorhexidine Gluconate Cloth  6 each Topical Daily  . darbepoetin (ARANESP) injection - NON-DIALYSIS  150 mcg Subcutaneous Q Sun-1800  . diltiazem  60 mg Oral Q8H  . feeding supplement (ENSURE ENLIVE)  237 mL Oral BID BM  . insulin aspart  0-6 Units Subcutaneous Q4H  . insulin glargine  10 Units Subcutaneous Daily  . multivitamin with minerals  1 tablet Oral Daily  . polyethylene glycol  17 g Oral Once  . sevelamer carbonate  800 mg Oral TID WC  . sodium zirconium cyclosilicate  10 g Oral Once  . tamsulosin  0.4 mg Oral Daily   Continuous Inpatient Infusions:  . sodium chloride    . piperacillin-tazobactam (ZOSYN)  IV 2.25 g (02/16/20 0644)   PRN Inpatient Medications: acetaminophen **OR** acetaminophen   Physical Examination  Temp:  [98.2 F (36.8 C)-98.4 F (36.9 C)] 98.2 F (36.8 C) (09/07 0404) Pulse Rate:  [81-87] 87 (09/07 1005) Resp:  [13-19] 19 (09/07 1005) BP: (126-137)/(69-84) 126/84 (09/07 0404) SpO2:  [98 %-100 %] 100 % (09/07 1005) Temp (24hrs), Avg:98.3 F (36.8 C), Min:98.2 F (36.8  C), Max:98.4 F (36.9 C)  Weight: 67.5 kg GEN: NAD, appears stated age, doesn't appear chronically ill PSYCH: Cooperative, without pressured speech EYE: Conjunctivae pink, sclerae anicteric ENT: MMM, without oral ulcers, no erythema or exudates noted NECK: Supple CV: RR without R/Gs  RESP: CTAB posteriorly, without wheezing GI: NABS, soft, NT/ND, without rebound or guarding, no HSM appreciated GU: DRE shows MSK/EXT: _ edema, no palmar erythema SKIN: No jaundice, no spider angiomata, no concerning rashes NEURO:  Alert & Oriented x 3, no focal deficits, no evidence of asterixis   Review of Data   Laboratory Studies   Recent Labs  Lab 02/14/20 0342 02/14/20 0657 02/15/20 0219 02/15/20 0219 02/16/20 0226  NA 145   < > 147*   < > 137  K 4.8   < > 3.7   < > 3.7  CL 107   < > 110   < > 103  CO2 21*   < > 22   < > 22  BUN 86*   < > 83*   < > 77*  CREATININE 6.05*   < > 5.86*   < > 5.37*  GLUCOSE 200*   < > 155*   < > 118*  CALCIUM 8.7*   < > 8.2*   < > 8.0*  PHOS 7.2*  --  6.7*  --  5.3*   < > = values in this interval not displayed.   Recent Labs  Lab 02/13/20 0956  AST 33  ALT 43  ALKPHOS 54    Recent Labs  Lab 02/13/20 2014 02/13/20 2014 02/15/20 1028 02/15/20 1028 02/16/20 0226  WBC 27.2*   < > 17.7*   < > 12.3*  HGB 7.9*   < > 8.0*   < > 8.0*  HCT 24.5*   < > 25.4*   < > 25.3*  PLT 256  --  265  --  272   < > = values in this interval not displayed.   No results for input(s): APTT, INR in the last 168 hours.  Imaging Studies  No new results to review  GI Procedures and Studies  No results found.   ASSESSMENT  [name] is a 79 y.o. male with a pmh significant for prior HCC, DM, HTN, chronic renal insufficiency who presented with acute on chronic renal insufficiency with urosepsis and also found to have iron deficiency anemia.  Patient is hemodynamically stable.  Cr is stable.  Looks as if there will be no other procedures from Urology perspective.   We will plan an EGD/Colonoscopy for evaluation of IDA and history of prior colon cancer on Thursday.  Patient in agreement with this plan.  The risks and benefits of endoscopic evaluation were discussed with the patient; these include but are not limited to the risk of perforation, infection, bleeding, missed lesions, lack of diagnosis, severe illness requiring hospitalization, as well as anesthesia and sedation related illnesses.  The patient is agreeable to proceed.    PLAN/RECOMMENDATIONS  EGD/Colonoscopy on Thursday NPO on midnight Thursday AM Miralax + Dulcolax today Preparation to be ordered for Wednesday If any contraindication by Urology or Medicine, please let us know   Please page/call with questions or concerns.   Justice Britain, MD [facility]oscopy Office # [phone]    LOS: 28 days  Irving Copas  02/16/2020, 1:53 PM

## 2020-02-16 NOTE — Progress Notes (Signed)
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 CT abdomen pelvis with severely atrophic left kidney and right kidney has moderate hydroureteronephrosis. Urology was consulted and had stent placement on 8/25. Creatinine appears to have stalled around about 6 mg/dL but has improved with 1/2 NS to 5.37.  Will continue for another 24 hrs.  There are no signs of uremia and no indication for dialysis at present.  Follow closely  # Severe sepsis 2/2 Morganella bacteremia: blood cultures from 02/12/20 positive, on Zosyn.    #Solitary kidney with right hydronephrosis: Seen by urology and underwent cystoscopy and placement of double J stent.  #Hyperkalemia.  Potassium improved, off Lokelma  #Hypernatremia, hypovolemic:Improved on 1/2 NS  #Anion gap metabolic acidosis: This is improved  #Hypertension: Hypertension appears well-controlled   #Failure to thrive/weight loss, severe protein calorie malnutrition: Multiple etiology.  Continue oral supplement and management of renal failure as above.  #Hyperphosphatemia we will start binders Renvela 800 mg with meals.  Will check PTH.  #Anemia acute blood loss anemia being followed by gastroenterology.  Rec EGD and colonoscopy pending improvement.  Aranesp 150 q week   Subjective:    Cr improving with IVFs, still with robust UOP.      Objective:   BP 126/84 (BP Location: Right Arm)   Pulse 87   Temp 98.2 F (36.8 C) (Oral)   Resp 19   Ht 5\' 7"  (1.702 m)   Wt 67.5 kg   SpO2 100%   BMI 23.31 kg/m   Intake/Output Summary (Last 24 hours) at 02/16/2020 1136 Last data filed at 02/16/2020 0757 Gross per 24 hour  Intake 1560.5 ml  Output 2825 ml  Net -1264.5 ml   Weight change:   Physical Exam: Gen: NAD, sitting in bed CVS: RRR Resp: clear Abd: nontender Ext: no LE  edema  Imaging: No results found.  Labs: [employer] Recent Labs  Lab 02/11/20 0254 02/11/20 0254 02/12/20 6834 02/12/20 0821 02/12/20 2013 02/12/20 2243 02/13/20 0130 02/13/20 0130 02/13/20 0553 02/13/20 1962 02/13/20 2014 02/13/20 2014 02/14/20 0342 02/14/20 0342 02/14/20 0657 02/14/20 1127 02/14/20 1429 02/14/20 1815 02/14/20 2150 02/15/20 0219 02/16/20 0226  NA 136  133*   < > 138   < > 137  --  138  --  137  --  144  --  145  --   --   --   --   --   --  147* 137  K 4.7  4.6   < > 5.6*   < > 6.7*   < > 6.3*   < > 6.4*   < > 5.2*   < > 4.8   < > 4.5 4.2 3.5 3.8 4.0 3.7 3.7  CL 104  101   < > 102   < > 98  --  99  --  102  --  107  --  107  --   --   --   --   --   --  110 103  CO2 22  22   < > 24   < > 21*  --  22  --  23  --  23  --  21*  --   --   --   --   --   --  22 22  GLUCOSE 141*  139*   < >  167*   < > 193*  --  123*  --  140*  --  131*  --  200*  --   --   --   --   --   --  155* 118*  BUN 82*  85*   < > 77*   < > 92*  --  94*  --  92*  --  85*  --  86*  --   --   --   --   --   --  83* 77*  CREATININE 5.13*  5.04*   < > 5.26*   < > 6.05*  --  6.03*  --  5.90*  --  6.10*  --  6.05*  --   --   --   --   --   --  5.86* 5.37*  CALCIUM 8.4*  8.4*   < > 8.4*   < > 8.9  --  8.9  --  8.6*  --  8.8*  --  8.7*  --   --   --   --   --   --  8.2* 8.0*  PHOS 3.5  --  3.7  --   --   --  1.8*  --   --   --  7.0*  --  7.2*  --   --   --   --   --   --  6.7* 5.3*   < > = values in this interval not displayed.   CBC Recent Labs  Lab 02/12/20 0821 02/12/20 0821 02/13/20 0130 02/13/20 0130 02/13/20 0553 02/13/20 2014 02/15/20 1028 02/16/20 0226  WBC 10.3   < > 32.3*  --   --  27.2* 17.7* 12.3*  NEUTROABS 6.8  --  29.2*  --   --   --  15.7* 10.1*  HGB 7.6*   < > 6.8*   < > 6.5* 7.9* 8.0* 8.0*  HCT 23.8*   < > 21.1*   < > 20.0* 24.5* 25.4* 25.3*  MCV 91.9   < > 90.9  --   --  89.4 91.4 93.4  PLT 276   < > 267  --   --  256 265 272   < > = values in this interval not  displayed.    Medications:    . (feeding supplement) PROSource Plus  30 mL Oral BID BM  . Chlorhexidine Gluconate Cloth  6 each Topical Daily  . darbepoetin (ARANESP) injection - NON-DIALYSIS  150 mcg Subcutaneous Q Sun-1800  . diltiazem  60 mg Oral Q8H  . feeding supplement (ENSURE ENLIVE)  237 mL Oral BID BM  . insulin aspart  0-6 Units Subcutaneous Q4H  . insulin glargine  10 Units Subcutaneous Daily  . multivitamin with minerals  1 tablet Oral Daily  . sevelamer carbonate  800 mg Oral TID WC  . sodium zirconium cyclosilicate  10 g Oral Once  . tamsulosin  0.4 mg Oral Daily      Madelon Lips, MD 02/16/2020, 11:36 AM
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
## 2020-02-16 NOTE — Progress Notes (Signed)
Physical Therapy Treatment Patient Details Name: Paul Bond MRN: [id number] DOB: 1941/05/12 Today's Date: 02/16/2020    History of Present Illness Paul Bond is a 79 y.o. male with medical history significant of history of cancer, diabetes, hypertension, stroke presenting to the ED via EMS from home for evaluation of failure to thrive.  Patient was hypotensive with EMS and given 800 cc normal saline in route. patient is normally quite physically active and cuts his own grass/does yard work.  He normally takes care of his wife as well.  For the past 3 weeks leading up to admission he has not been eating and has been bedbound; Principal problem -- Acute Kidney Injury with sepsis    PT Comments    Pt with good progress and tolerance for activity today.  He was able to complete multiple transfers and ambulate 30' with RW and chair follow for safety.  Improved safety awareness with sitting.  Cont POC.    Follow Up Recommendations  SNF;Supervision for mobility/OOB     Equipment Recommendations  Rolling walker with 5" wheels;3in1 (PT)    Recommendations for Other Services       Precautions / Restrictions Precautions Precautions: Fall    Mobility  Bed Mobility Overal bed mobility: Needs Assistance Bed Mobility: Supine to Sit     Supine to sit: Min assist     General bed mobility comments: cues for rolling and pushing up with arms  Transfers Overall transfer level: Needs assistance Equipment used: Rolling walker (2 wheeled) Transfers: Sit to/from Stand Sit to Stand: Min assist;From elevated surface         General transfer comment: Performed sit to stand x 3 with cues for safe hand placement and min A to power up.  Ambulation/Gait Ambulation/Gait assistance: Min assist;+2 safety/equipment Gait Distance (Feet): 30 Feet (3'x2 then 30') Assistive device: Rolling walker (2 wheeled) Gait Pattern/deviations: Step-through pattern;Decreased stride length;Trunk flexed Gait  velocity: decr   General Gait Details: Steps to and from [facility] with assist for pericare due to BM.  Then ambulated 30' with RW and chair follow. Cued for posture, increased step length, and assist wtih RW around obstacles.   Stairs             Wheelchair Mobility    Modified Rankin (Stroke Patients Only)       Balance Overall balance assessment: Needs assistance Sitting-balance support: Feet supported;No upper extremity supported Sitting balance-[name] Scale: Good     Standing balance support: Bilateral upper extremity supported;During functional activity Standing balance-[name] Scale: Poor Standing balance comment: Requires UE support and external support                            Cognition Arousal/Alertness: Awake/alert Behavior During Therapy: WFL for tasks assessed/performed Overall Cognitive Status: No family/caregiver present to determine baseline cognitive functioning Area of Impairment: Problem solving                       Following Commands: Follows one step commands consistently;Follows multi-step commands inconsistently     Problem Solving: Slow processing;Decreased initiation;Difficulty sequencing;Requires verbal cues;Requires tactile cues        Exercises      General Comments General comments (skin integrity, edema, etc.): VSS      Pertinent Vitals/Pain Pain Assessment: No/denies pain    Home Living  Prior Function            PT Goals (current goals can now be found in the care plan section) Acute Rehab PT Goals Patient Stated Goal: get better soon PT Goal Formulation: With patient Time For Goal Achievement: 02/21/20 Potential to Achieve Goals: Good Progress towards PT goals: Progressing toward goals    Frequency    Min 3X/week      PT Plan Current plan remains appropriate    Co-evaluation              AM-PAC PT "6 Clicks" Mobility   Outcome Measure  Help needed  turning from your back to your side while in a flat bed without using bedrails?: A Mondor Help needed moving from lying on your back to sitting on the side of a flat bed without using bedrails?: A Neville Help needed moving to and from a bed to a chair (including a wheelchair)?: A Neitzke Help needed standing up from a chair using your arms (e.g., wheelchair or bedside chair)?: A Blaszczyk Help needed to walk in hospital room?: A Leer Help needed climbing 3-5 steps with a railing? : A Lot 6 Click Score: 17    End of Session Equipment Utilized During Treatment: Gait belt Activity Tolerance: Patient tolerated treatment well Patient left: with call bell/phone within reach;with chair alarm set;in chair Nurse Communication: Mobility status PT Visit Diagnosis: Unsteadiness on feet (R26.81);Other abnormalities of gait and mobility (R26.89);Muscle weakness (generalized) (M62.81)     Time: 1610-9604 PT Time Calculation (min) (ACUTE ONLY): 25 min  Charges:  $Gait Training: 8-22 mins $Therapeutic Activity: 8-22 mins                     Abran Richard, PT Acute Rehab Services Pager [phone] [facility] 02/16/2020, 2:01 PM

## 2020-02-16 NOTE — H&P (View-Only) (Signed)
Gastroenterology Inpatient Follow-up Note   PATIENT IDENTIFICATION  Paul Bond is a 79 y.o. male with a pmh significant for prior HCC, DM, HTN, chronic renal insufficiency who presented with acute on chronic renal insufficiency with urosepsis and also found to have iron deficiency anemia. Hospital Day: 15  SUBJECTIVE  Patient creatinine is stable. Leukocytosis continues to improve. Patient has minimal abdominal discomfort. Patient thinks he will be ready for endoscopic evaluation in the next 1 to 2 days. It does not look like there will be further urology procedures as an inpatient. Patient denies any fevers or chills. Abdominal pain is present but improving. No nausea or vomiting.   OBJECTIVE  Scheduled Inpatient Medications:  . (feeding supplement) PROSource Plus  30 mL Oral BID BM  . [START ON 02/17/2020] bisacodyl  20 mg Oral Once  . bisacodyl  5 mg Oral Once  . Chlorhexidine Gluconate Cloth  6 each Topical Daily  . darbepoetin (ARANESP) injection - NON-DIALYSIS  150 mcg Subcutaneous Q Sun-1800  . diltiazem  60 mg Oral Q8H  . feeding supplement (ENSURE ENLIVE)  237 mL Oral BID BM  . insulin aspart  0-6 Units Subcutaneous Q4H  . insulin glargine  10 Units Subcutaneous Daily  . multivitamin with minerals  1 tablet Oral Daily  . polyethylene glycol  17 g Oral Once  . sevelamer carbonate  800 mg Oral TID WC  . sodium zirconium cyclosilicate  10 g Oral Once  . tamsulosin  0.4 mg Oral Daily   Continuous Inpatient Infusions:  . sodium chloride    . piperacillin-tazobactam (ZOSYN)  IV 2.25 g (02/16/20 0644)   PRN Inpatient Medications: acetaminophen **OR** acetaminophen   Physical Examination  Temp:  [98.2 F (36.8 C)-98.4 F (36.9 C)] 98.2 F (36.8 C) (09/07 0404) Pulse Rate:  [81-87] 87 (09/07 1005) Resp:  [13-19] 19 (09/07 1005) BP: (126-137)/(69-84) 126/84 (09/07 0404) SpO2:  [98 %-100 %] 100 % (09/07 1005) Temp (24hrs), Avg:98.3 F (36.8 C), Min:98.2 F (36.8  C), Max:98.4 F (36.9 C)  Weight: 67.5 kg GEN: NAD, appears stated age, doesn't appear chronically ill PSYCH: Cooperative, without pressured speech EYE: Conjunctivae pink, sclerae anicteric ENT: MMM, without oral ulcers, no erythema or exudates noted NECK: Supple CV: RR without R/Gs  RESP: CTAB posteriorly, without wheezing GI: NABS, soft, NT/ND, without rebound or guarding, no HSM appreciated GU: DRE shows MSK/EXT: _ edema, no palmar erythema SKIN: No jaundice, no spider angiomata, no concerning rashes NEURO:  Alert & Oriented x 3, no focal deficits, no evidence of asterixis   Review of Data   Laboratory Studies   Recent Labs  Lab 02/14/20 0342 02/14/20 0657 02/15/20 0219 02/15/20 0219 02/16/20 0226  NA 145   < > 147*   < > 137  K 4.8   < > 3.7   < > 3.7  CL 107   < > 110   < > 103  CO2 21*   < > 22   < > 22  BUN 86*   < > 83*   < > 77*  CREATININE 6.05*   < > 5.86*   < > 5.37*  GLUCOSE 200*   < > 155*   < > 118*  CALCIUM 8.7*   < > 8.2*   < > 8.0*  PHOS 7.2*  --  6.7*  --  5.3*   < > = values in this interval not displayed.   Recent Labs  Lab 02/13/20 0956  AST 33  ALT 43  ALKPHOS 54    Recent Labs  Lab 02/13/20 2014 02/13/20 2014 02/15/20 1028 02/15/20 1028 02/16/20 0226  WBC 27.2*   < > 17.7*   < > 12.3*  HGB 7.9*   < > 8.0*   < > 8.0*  HCT 24.5*   < > 25.4*   < > 25.3*  PLT 256  --  265  --  272   < > = values in this interval not displayed.   No results for input(s): APTT, INR in the last 168 hours.  Imaging Studies  No new results to review  GI Procedures and Studies  No results found.   ASSESSMENT  Paul Bond is a 79 y.o. male with a pmh significant for prior HCC, DM, HTN, chronic renal insufficiency who presented with acute on chronic renal insufficiency with urosepsis and also found to have iron deficiency anemia.  Patient is hemodynamically stable.  Cr is stable.  Looks as if there will be no other procedures from Urology perspective.   We will plan an EGD/Colonoscopy for evaluation of IDA and history of prior colon cancer on Thursday.  Patient in agreement with this plan.  The risks and benefits of endoscopic evaluation were discussed with the patient; these include but are not limited to the risk of perforation, infection, bleeding, missed lesions, lack of diagnosis, severe illness requiring hospitalization, as well as anesthesia and sedation related illnesses.  The patient is agreeable to proceed.    PLAN/RECOMMENDATIONS  EGD/Colonoscopy on Thursday NPO on midnight Thursday AM Miralax + Dulcolax today Preparation to be ordered for Wednesday If any contraindication by Urology or Medicine, please let us know   Please page/call with questions or concerns.   Justice Britain, MD [facility] Office # [phone]    LOS: 65 days  Irving Copas  02/16/2020, 1:53 PM

## 2020-02-17 LAB — CBC WITH DIFFERENTIAL/PLATELET
Abs Immature Granulocytes: 0.19 10*3/uL — ABNORMAL HIGH (ref 0.00–0.07)
Basophils Absolute: 0 10*3/uL (ref 0.0–0.1)
Basophils Relative: 0 %
Eosinophils Absolute: 0.2 10*3/uL (ref 0.0–0.5)
Eosinophils Relative: 2 %
HCT: 24.6 % — ABNORMAL LOW (ref 39.0–52.0)
Hemoglobin: 7.6 g/dL — ABNORMAL LOW (ref 13.0–17.0)
Immature Granulocytes: 2 %
Lymphocytes Relative: 12 %
Lymphs Abs: 1.4 10*3/uL (ref 0.7–4.0)
MCH: 28.9 pg (ref 26.0–34.0)
MCHC: 30.9 g/dL (ref 30.0–36.0)
MCV: 93.5 fL (ref 80.0–100.0)
Monocytes Absolute: 1.1 10*3/uL — ABNORMAL HIGH (ref 0.1–1.0)
Monocytes Relative: 10 %
Neutro Abs: 8.4 10*3/uL — ABNORMAL HIGH (ref 1.7–7.7)
Neutrophils Relative %: 74 %
Platelets: 274 10*3/uL (ref 150–400)
RBC: 2.63 MIL/uL — ABNORMAL LOW (ref 4.22–5.81)
RDW: 15.8 % — ABNORMAL HIGH (ref 11.5–15.5)
WBC: 11.2 10*3/uL — ABNORMAL HIGH (ref 4.0–10.5)
nRBC: 0.4 % — ABNORMAL HIGH (ref 0.0–0.2)

## 2020-02-17 LAB — GLUCOSE, CAPILLARY
Glucose-Capillary: 131 mg/dL — ABNORMAL HIGH (ref 70–99)
Glucose-Capillary: 167 mg/dL — ABNORMAL HIGH (ref 70–99)
Glucose-Capillary: 156 mg/dL — ABNORMAL HIGH (ref 70–99)
Glucose-Capillary: 105 mg/dL — ABNORMAL HIGH (ref 70–99)
Glucose-Capillary: 113 mg/dL — ABNORMAL HIGH (ref 70–99)

## 2020-02-17 LAB — RENAL FUNCTION PANEL
Albumin: 2.1 g/dL — ABNORMAL LOW (ref 3.5–5.0)
Anion gap: 12 (ref 5–15)
BUN: 73 mg/dL — ABNORMAL HIGH (ref 8–23)
CO2: 21 mmol/L — ABNORMAL LOW (ref 22–32)
Calcium: 8.3 mg/dL — ABNORMAL LOW (ref 8.9–10.3)
Chloride: 104 mmol/L (ref 98–111)
Creatinine, Ser: 5.3 mg/dL — ABNORMAL HIGH (ref 0.61–1.24)
GFR calc Af Amer: 11 mL/min — ABNORMAL LOW (ref >60)
GFR calc non Af Amer: 9 mL/min — ABNORMAL LOW (ref >60)
Glucose, Bld: 121 mg/dL — ABNORMAL HIGH (ref 70–99)
Phosphorus: 4.9 mg/dL — ABNORMAL HIGH (ref 2.5–4.6)
Potassium: 4.3 mmol/L (ref 3.5–5.1)
Sodium: 137 mmol/L (ref 135–145)

## 2020-02-17 MED ORDER — PEG-KCL-NACL-NASULF-NA ASC-C 100 G PO SOLR: 0.5000 | Freq: Once | ORAL | Status: DC

## 2020-02-17 MED ORDER — BISACODYL 5 MG PO TBEC: 10.0000 mg | DELAYED_RELEASE_TABLET | Freq: Once | ORAL | Status: DC

## 2020-02-17 MED ORDER — PEG-KCL-NACL-NASULF-NA ASC-C 100 G PO SOLR
0.5000 | Freq: Once | ORAL | Status: AC
  Administered 2020-02-17: 100 g via ORAL
  Filled 2020-02-17: qty 1

## 2020-02-17 MED ORDER — INSULIN ASPART 100 UNIT/ML ~~LOC~~ SOLN
0.0000 [IU] | Freq: Three times a day (TID) | SUBCUTANEOUS | Status: DC
  Administered 2020-02-17 – 2020-02-19 (×3): 1 [IU] via SUBCUTANEOUS
  Administered 2020-02-19 – 2020-02-20 (×2): 2 [IU] via SUBCUTANEOUS
  Administered 2020-02-20 (×2): 1 [IU] via SUBCUTANEOUS

## 2020-02-17 MED ORDER — PEG-KCL-NACL-NASULF-NA ASC-C 100 G PO SOLR
0.5000 | Freq: Once | ORAL | Status: DC
  Filled 2020-02-17: qty 1

## 2020-02-17 MED ORDER — BISACODYL 5 MG PO TBEC: 10.0000 mg | DELAYED_RELEASE_TABLET | Freq: Once | ORAL | Status: AC

## 2020-02-17 MED ORDER — SODIUM CHLORIDE 0.45 % IV SOLN: INTRAVENOUS | Status: DC

## 2020-02-17 MED ORDER — PEG-KCL-NACL-NASULF-NA ASC-C 100 G PO SOLR
0.5000 | Freq: Once | ORAL | Status: AC
  Administered 2020-02-18: 100 g via ORAL

## 2020-02-17 MED ORDER — METOCLOPRAMIDE HCL 5 MG/ML IJ SOLN
10.0000 mg | Freq: Once | INTRAMUSCULAR | Status: AC
  Administered 2020-02-18: 10 mg via INTRAVENOUS
  Filled 2020-02-17: qty 2

## 2020-02-17 MED ORDER — METOCLOPRAMIDE HCL 5 MG/ML IJ SOLN
10.0000 mg | Freq: Once | INTRAMUSCULAR | Status: AC
  Administered 2020-02-17: 10 mg via INTRAVENOUS
  Filled 2020-02-17: qty 2

## 2020-02-17 NOTE — Progress Notes (Signed)
Nutrition Follow-up  DOCUMENTATION CODES:   Severe malnutrition in context of acute illness/injury  INTERVENTION:   Once diet advanced after EGD/colonoscpy   Ensure Enlive po BID, each supplement provides 350 kcal and 20 grams of protein  Add Magic cup TID with meals, each supplement provides 290 kcal and 9 grams of protein  MVI daily   NUTRITION DIAGNOSIS:   Severe Malnutrition related to acute illness (obstructive uropathy) as evidenced by moderate muscle depletion, severe muscle depletion.  Ongoing  GOAL:   Patient will meet greater than or equal to 90% of their needs  Progressing   MONITOR:   PO intake, Supplement acceptance, Weight trends, Labs, I & O's  REASON FOR ASSESSMENT:   Consult Assessment of nutrition requirement/status  ASSESSMENT:   Patient with PMH significant for DM, HTN, cancer (unknown type), and stroke. Presents this admission with failure to thrive and AKI.   8/25- cystoscopy, stent placement   Plan EGD/colonoscopy tomorrow. Pt eating well per reports. Last two meal completions charted as 10% and 25%. Drinking Ensure 1-2 times daily. RD encouraged intake and supplement acceptance.   Admission weight: 66.9 kg  Current weight: 67.5 kg   I/O: -21,863 ml since 8/25 UOP: 4,675 ml x 24 hrs   Medications: dulcolax, aranesp, SS novolog, lantus, MVI with minerals, renvela, lokelma Labs: Phosphorus 4.9 (H) CBG 113-189 Cr 5.30-trending down  Diet Order:   Diet Order            Diet NPO time specified  Diet effective midnight           Diet clear liquid Room service appropriate? Yes; Fluid consistency: Thin  Diet effective 0500 tomorrow                 EDUCATION NEEDS:   Education needs have been addressed  Skin:  Skin Assessment: Reviewed RN Assessment  Last BM:  9/7  Height:   Ht Readings from Last 1 Encounters:  02/07/20 5\' 7"  (1.702 m)    Weight:   Wt Readings from Last 1 Encounters:  02/07/20 67.5 kg    BMI:  Body  mass index is 23.31 kg/m.  Estimated Nutritional Needs:   Kcal:  2150-2350 kcal  Protein:  110-125 grams  Fluid:  >/= 2.1 L/day   Paul Bond RD, LDN Clinical Nutrition Pager listed in [place]

## 2020-02-17 NOTE — Progress Notes (Signed)
Pharmacy Antibiotic Note  Paul Bond is a 79 y.o. male on day #5 Zosyn for Morganella bacteremia and UTI.  AKI due to obstructive uropathy. R ureteral stent placed 8/25. Solitary kidney. Creatinine has trended down slowly. Zosyn regimen remains appropriate for renal function. Afebrile, WBC and PCT improved.  Plan:  Continue Zosyn 2.25 gm VI q8h.  Follow renal function for any need to adjust.  Noted plan for 10-14 days antibiotics, with Cipro planned as outpatient.  Height: 5\' 7"  (170.2 cm) Weight: 67.5 kg (148 lb 13 oz) IBW/kg (Calculated) : 66.1  Temp (24hrs), Avg:98.8 F (37.1 C), Min:98.3 F (36.8 C), Max:99.5 F (37.5 C)  Recent Labs  Lab 02/13/20 0130 02/13/20 0130 02/13/20 0553 02/13/20 0553 02/13/20 2014 02/14/20 0342 02/15/20 0219 02/15/20 1028 02/16/20 0226 02/17/20 0435  WBC 32.3*  --   --   --  27.2*  --   --  17.7* 12.3* 11.2*  CREATININE 6.03*   < > 5.90*   < > 6.10* 6.05* 5.86*  --  5.37* 5.30*  LATICACIDVEN 5.3*  --  2.0*  --   --   --   --   --   --   --    < > = values in this interval not displayed.    Estimated Creatinine Clearance: 10.6 mL/min (A) (by C-G formula based on SCr of 5.3 mg/dL (H)).    No Known Allergies  Antimicrobials this admission:  Zosyn 9/4 >>  Dose adjustments this admission:  9/4: adjusted from 3.375 > 2.25 gm IV q8h for renal function  Microbiology results: 9/3 blood: Morganella morganii - R - sens Cipro, Gent MIC <1, Imipenem, Zosyn, Septra;  R - amp, Unasyn, cefazolin  9/3 urine: 100k Morganella morganii - same sens as above, R - amp, Unasyn, cefazolin, nitrofurantoin 8/24 urine: negative 8/24 COVID: negative  Thank you for allowing pharmacy to be a part of this patient's care.  Arty Baumgartner, [place] Phone: [phone] 02/17/2020 11:50 AM

## 2020-02-17 NOTE — Care Management Important Message (Signed)
Important Message  Patient Details  Name: Paul Bond MRN: [id number] Date of Birth: 1941-02-18   Medicare Important Message Given:  Yes     Shelda Altes 02/17/2020, 12:04 PM

## 2020-02-17 NOTE — Anesthesia Preprocedure Evaluation (Addendum)
Anesthesia Evaluation  Patient identified by MRN, date of birth, ID band Patient awake    Reviewed: Allergy & Precautions, NPO status , Patient's Chart, lab work & pertinent test results  History of Anesthesia Complications Negative for: history of anesthetic complications  Airway Mallampati: II  TM Distance: >3 FB Neck ROM: Full    Dental  (+) Dental Advisory Given   Pulmonary former smoker,    Pulmonary exam normal        Cardiovascular hypertension, Pt. on medications Normal cardiovascular exam     Neuro/Psych CVA, Residual Symptoms negative psych ROS   GI/Hepatic negative GI ROS, Neg liver ROS,   Endo/Other  diabetes, Type 2, Insulin Dependent  Renal/GU  Solitary kindey      Musculoskeletal negative musculoskeletal ROS (+)   Abdominal   Peds  Hematology  (+) anemia ,   Anesthesia Other Findings Covid test negative 02/02/19    Reproductive/Obstetrics                            Anesthesia Physical Anesthesia Plan  ASA: III  Anesthesia Plan: MAC   Post-op Pain Management:    Induction: Intravenous  PONV Risk Score and Plan: 1 and Propofol infusion and Treatment may vary due to age or medical condition  Airway Management Planned: Nasal Cannula and Natural Airway  Additional Equipment: None  Intra-op Plan:   Post-operative Plan:   Informed Consent: I have reviewed the patients History and Physical, chart, labs and discussed the procedure including the risks, benefits and alternatives for the proposed anesthesia with the patient or authorized representative who has indicated his/her understanding and acceptance.       Plan Discussed with: CRNA and Anesthesiologist  Anesthesia Plan Comments:        Anesthesia Quick Evaluation

## 2020-02-17 NOTE — Progress Notes (Signed)
[facility] Progress Note    Assessment/ Plan:   #  Advanced nonoliguric AKI on CKD due to obstructive uropathy in solitary kidney: Urinalysis has minimal protein and microscopic hematuria. CT abdomen pelvis with severely atrophic left kidney and right kidney has moderate hydroureteronephrosis. Urology was consulted and had stent placement on 8/25. Creatinine appears to have stalled around about 6 mg/dL but has improved with 1/2 NS to 5.37.  Still having post-obstructive diuresis.  Continue 1/2NS @75  mL/ hr for now esp as will be NPO for EGD/ Cscope. There are no signs of uremia and no indication for dialysis at present.  Follow closely  # Severe sepsis 2/2 Morganella bacteremia: blood cultures from 02/12/20 positive, on Zosyn.    #Solitary kidney with right hydronephrosis: Seen by urology and underwent cystoscopy and placement of double J stent.  #Hyperkalemia.  Potassium improved, off Lokelma  #Hypernatremia, hypovolemic:Improved on 1/2 NS  #Anion gap metabolic acidosis: This is improved  #Hypertension: Hypertension appears well-controlled   #Failure to thrive/weight loss, severe protein calorie malnutrition: Multiple etiology.  Continue oral supplement and management of renal failure as above.  #Hyperphosphatemia we will start binders Renvela 800 mg with meals.   #Anemia acute blood loss anemia being followed by gastroenterology.EGD/ Cscope tomorrow (Thursday)  Aranesp 150 q week   Subjective:    For EGD and c-scope tomorrow.  4.6L UOP.  Remains on IVFs.     Objective:   BP 135/72 (BP Location: Left Arm)   Pulse 84   Temp 98.7 F (37.1 C) (Oral)   Resp 11   Ht 5' 7"  (1.702 m)   Wt 67.5 kg   SpO2 100%   BMI 23.31 kg/m   Intake/Output Summary (Last 24 hours) at 02/17/2020 1148 Last data filed at 02/17/2020 0700 Gross per 24 hour  Intake 1859.04 ml  Output 2975 ml  Net -1115.96 ml   Weight change:   Physical Exam: Gen: NAD, sitting in bed NECK:  no JVD CVS: RRR Resp: clear, respirations nonlabored Abd: nontender Ext: no LE edema  Imaging: No results found.  Labs: BMET Recent Labs  Lab 02/12/20 0821 02/12/20 2013 02/13/20 0130 02/13/20 0130 02/13/20 0553 02/13/20 8413 02/13/20 2014 02/13/20 2014 02/14/20 0342 02/14/20 0657 02/14/20 1127 02/14/20 1429 02/14/20 1815 02/14/20 2150 02/15/20 0219 02/16/20 0226 02/17/20 0435  NA 138   < > 138  --  137  --  144  --  145  --   --   --   --   --  147* 137 137  K 5.6*   < > 6.3*   < > 6.4*   < > 5.2*   < > 4.8   < > 4.2 3.5 3.8 4.0 3.7 3.7 4.3  CL 102   < > 99  --  102  --  107  --  107  --   --   --   --   --  110 103 104  CO2 24   < > 22  --  23  --  23  --  21*  --   --   --   --   --  22 22 21*  GLUCOSE 167*   < > 123*  --  140*  --  131*  --  200*  --   --   --   --   --  155* 118* 121*  BUN 77*   < > 94*  --  92*  --  85*  --  86*  --   --   --   --   --  83* 77* 73*  CREATININE 5.26*   < > 6.03*  --  5.90*  --  6.10*  --  6.05*  --   --   --   --   --  5.86* 5.37* 5.30*  CALCIUM 8.4*   < > 8.9  --  8.6*  --  8.8*  --  8.7*  --   --   --   --   --  8.2* 8.0* 8.3*  PHOS 3.7  --  1.8*  --   --   --  7.0*  --  7.2*  --   --   --   --   --  6.7* 5.3* 4.9*   < > = values in this interval not displayed.   CBC Recent Labs  Lab 02/13/20 0130 02/13/20 0553 02/13/20 2014 02/15/20 1028 02/16/20 0226 02/17/20 0435  WBC 32.3*  --  27.2* 17.7* 12.3* 11.2*  NEUTROABS 29.2*  --   --  15.7* 10.1* 8.4*  HGB 6.8*   < > 7.9* 8.0* 8.0* 7.6*  HCT 21.1*   < > 24.5* 25.4* 25.3* 24.6*  MCV 90.9  --  89.4 91.4 93.4 93.5  PLT 267  --  256 265 272 274   < > = values in this interval not displayed.    Medications:    . [START ON 02/18/2020] bisacodyl  10 mg Oral Once  . bisacodyl  10 mg Oral Once  . bisacodyl  20 mg Oral Once  . Chlorhexidine Gluconate Cloth  6 each Topical Daily  . darbepoetin (ARANESP) injection - NON-DIALYSIS  150 mcg Subcutaneous Q Sun-1800  . diltiazem  60  mg Oral Q8H  . insulin aspart  0-6 Units Subcutaneous TID AC & HS  . insulin glargine  10 Units Subcutaneous Daily  . multivitamin with minerals  1 tablet Oral Daily  . peg 3350 powder  0.5 kit Oral Once  . [START ON 02/18/2020] peg 3350 powder  0.5 kit Oral Once  . sevelamer carbonate  800 mg Oral TID WC  . sodium zirconium cyclosilicate  10 g Oral Once  . tamsulosin  0.4 mg Oral Daily      Madelon Lips, MD 02/17/2020, 11:48 AM
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
## 2020-02-17 NOTE — Progress Notes (Signed)
Pt for colonoscopy and egd tmrw am Moviprep,  Reglans, dulcolax, clears ordered.   Pt has no complaints.     *   IDA S/p 1 prbc.  hgb 6.5 >> 7.6.  Aranesp initiated.    *   FTT, anorexia.  PCM.    *    AKI.  Obstructive uropathy.    *   Morganella sepsis and + blood clx.    *   Remote sigmoid colectomy.   *   Left lung nodule       Plan colon, egd tmrw

## 2020-02-18 ENCOUNTER — Inpatient Hospital Stay (HOSPITAL_COMMUNITY): Payer: Medicare Other | Admitting: Certified Registered Nurse Anesthetist

## 2020-02-18 ENCOUNTER — Encounter (HOSPITAL_COMMUNITY): Payer: Self-pay | Admitting: Internal Medicine

## 2020-02-18 ENCOUNTER — Other Ambulatory Visit: Payer: Self-pay

## 2020-02-18 ENCOUNTER — Encounter (HOSPITAL_COMMUNITY)
Admission: EM | Disposition: A | Discharge status: Skilled Nursing Facility | Payer: Self-pay | Source: Home / Self Care | Attending: Internal Medicine

## 2020-02-18 DIAGNOSIS — Z980 Intestinal bypass and anastomosis status: Secondary | ICD-10-CM

## 2020-02-18 DIAGNOSIS — K635 Polyp of colon: Secondary | ICD-10-CM

## 2020-02-18 DIAGNOSIS — K633 Ulcer of intestine: Secondary | ICD-10-CM

## 2020-02-18 DIAGNOSIS — D649 Anemia, unspecified: Secondary | ICD-10-CM

## 2020-02-18 DIAGNOSIS — K2970 Gastritis, unspecified, without bleeding: Secondary | ICD-10-CM

## 2020-02-18 DIAGNOSIS — K209 Esophagitis, unspecified without bleeding: Secondary | ICD-10-CM

## 2020-02-18 DIAGNOSIS — R195 Other fecal abnormalities: Secondary | ICD-10-CM

## 2020-02-18 DIAGNOSIS — D126 Benign neoplasm of colon, unspecified: Secondary | ICD-10-CM

## 2020-02-18 HISTORY — PX: BIOPSY: SHX5522

## 2020-02-18 HISTORY — PX: COLONOSCOPY WITH PROPOFOL: SHX5780

## 2020-02-18 HISTORY — PX: POLYPECTOMY: SHX5525

## 2020-02-18 HISTORY — PX: COLONOSCOPY: SHX174

## 2020-02-18 HISTORY — PX: ESOPHAGOGASTRODUODENOSCOPY (EGD) WITH PROPOFOL: SHX5813

## 2020-02-18 LAB — RENAL FUNCTION PANEL
Albumin: 2.3 g/dL — ABNORMAL LOW (ref 3.5–5.0)
Anion gap: 16 — ABNORMAL HIGH (ref 5–15)
BUN: 61 mg/dL — ABNORMAL HIGH (ref 8–23)
CO2: 19 mmol/L — ABNORMAL LOW (ref 22–32)
Calcium: 8.6 mg/dL — ABNORMAL LOW (ref 8.9–10.3)
Chloride: 105 mmol/L (ref 98–111)
Creatinine, Ser: 5.09 mg/dL — ABNORMAL HIGH (ref 0.61–1.24)
GFR calc Af Amer: 12 mL/min — ABNORMAL LOW (ref >60)
GFR calc non Af Amer: 10 mL/min — ABNORMAL LOW (ref >60)
Glucose, Bld: 93 mg/dL (ref 70–99)
Phosphorus: 5.1 mg/dL — ABNORMAL HIGH (ref 2.5–4.6)
Potassium: 4.1 mmol/L (ref 3.5–5.1)
Sodium: 140 mmol/L (ref 135–145)

## 2020-02-18 LAB — CBC WITH DIFFERENTIAL/PLATELET
Abs Immature Granulocytes: 0.41 10*3/uL — ABNORMAL HIGH (ref 0.00–0.07)
Basophils Absolute: 0 10*3/uL (ref 0.0–0.1)
Basophils Relative: 0 %
Eosinophils Absolute: 0.1 10*3/uL (ref 0.0–0.5)
Eosinophils Relative: 1 %
HCT: 26.4 % — ABNORMAL LOW (ref 39.0–52.0)
Hemoglobin: 8.5 g/dL — ABNORMAL LOW (ref 13.0–17.0)
Immature Granulocytes: 4 %
Lymphocytes Relative: 11 %
Lymphs Abs: 1.3 10*3/uL (ref 0.7–4.0)
MCH: 29.7 pg (ref 26.0–34.0)
MCHC: 32.2 g/dL (ref 30.0–36.0)
MCV: 92.3 fL (ref 80.0–100.0)
Monocytes Absolute: 1.1 10*3/uL — ABNORMAL HIGH (ref 0.1–1.0)
Monocytes Relative: 10 %
Neutro Abs: 8.3 10*3/uL — ABNORMAL HIGH (ref 1.7–7.7)
Neutrophils Relative %: 74 %
Platelets: 355 10*3/uL (ref 150–400)
RBC: 2.86 MIL/uL — ABNORMAL LOW (ref 4.22–5.81)
RDW: 15.3 % (ref 11.5–15.5)
WBC: 11.2 10*3/uL — ABNORMAL HIGH (ref 4.0–10.5)
nRBC: 0.3 % — ABNORMAL HIGH (ref 0.0–0.2)

## 2020-02-18 LAB — GLUCOSE, CAPILLARY
Glucose-Capillary: 83 mg/dL (ref 70–99)
Glucose-Capillary: 86 mg/dL (ref 70–99)
Glucose-Capillary: 79 mg/dL (ref 70–99)
Glucose-Capillary: 153 mg/dL — ABNORMAL HIGH (ref 70–99)
Glucose-Capillary: 101 mg/dL — ABNORMAL HIGH (ref 70–99)

## 2020-02-18 SURGERY — COLONOSCOPY WITH PROPOFOL
Anesthesia: Monitor Anesthesia Care

## 2020-02-18 MED ORDER — PROPOFOL 10 MG/ML IV BOLUS
INTRAVENOUS | Status: DC | PRN
  Administered 2020-02-18 (×4): 20 mg via INTRAVENOUS

## 2020-02-18 MED ORDER — SODIUM CHLORIDE 0.9 % IV SOLN: INTRAVENOUS | Status: DC

## 2020-02-18 MED ORDER — PROPOFOL 500 MG/50ML IV EMUL
INTRAVENOUS | Status: DC | PRN
  Administered 2020-02-18: 75 ug/kg/min via INTRAVENOUS

## 2020-02-18 SURGICAL SUPPLY — 25 items
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
## 2020-02-18 NOTE — Plan of Care (Signed)
Poc progressing.  

## 2020-02-18 NOTE — TOC Progression Note (Signed)
Transition of Care [facility]) - Progression Note    Patient Details  Name: Paul Bond MRN: [id number] Date of Birth: July 21, 1940  Transition of Care [facility] CM/SW [place], [place] Phone Number: 02/18/2020, 12:12 PM  Clinical Narrative:     CSW started insurance authorization for patient. Start date is for 9/11. Reference number is #[id number]. CSW faxed over clinicals for review.  Patient has SNF bed at [facility]. Insurance authorization requested to start on 9/11.  CSW will continue to follow.  Expected Discharge Plan: [place] Barriers to Discharge: Continued Medical Work up, SNF Pending bed offer  Expected Discharge Plan and Services Expected Discharge Plan: [place] In-house Referral: Clinical Social Work     Living arrangements for the past 2 months: Single Family Home                                       Social Determinants of Health (SDOH) Interventions    Readmission Risk Interventions No flowsheet data found.

## 2020-02-18 NOTE — Op Note (Signed)
[facility] Patient Name: Paul Bond Procedure Date : 02/18/2020 MRN: [id number] Attending MD: Carlota Raspberry. Havery Moros , MD Date of Birth: 30-Apr-1941 CSN: [id number] Age: 79 Admit Type: Inpatient Procedure:                Upper GI endoscopy Indications:              Heme positive stool, worsening anemia, renal failure Providers:                Carlota Raspberry. Havery Moros, MD, Wynonia Sours, RN, Tyrone Apple, Technician, Richardean Canal, CRNA Referring MD:              Medicines:                Monitored Anesthesia Care Complications:            No immediate complications. Estimated blood loss:                            Minimal. Estimated Blood Loss:     Estimated blood loss was minimal. Procedure:                Pre-Anesthesia Assessment:                           - Prior to the procedure, a History and Physical                            was performed, and patient medications and                            allergies were reviewed. The patient's tolerance of                            previous anesthesia was also reviewed. The risks                            and benefits of the procedure and the sedation                            options and risks were discussed with the patient.                            All questions were answered, and informed consent                            was obtained. Prior Anticoagulants: The patient has                            taken no previous anticoagulant or antiplatelet                            agents. ASA Grade Assessment: III - A patient with  severe systemic disease. After reviewing the risks                            and benefits, the patient was deemed in                            satisfactory condition to undergo the procedure.                           After obtaining informed consent, the endoscope was                            passed under direct vision. Throughout the                             procedure, the patient's blood pressure, pulse, and                            oxygen saturations were monitored continuously. The                            GIF-H190 ([id number]) Olympus gastroscope was                            introduced through the mouth, and advanced to the                            second part of duodenum. The upper GI endoscopy was                            accomplished without difficulty. The patient                            tolerated the procedure well. Scope In: Scope Out: Findings:      Esophagogastric landmarks were identified: the Z-line was found at 39       cm, the gastroesophageal junction was found at 39 cm and the upper       extent of the gastric folds was found at 41 cm from the incisors.      A 2 cm hiatal hernia was present.      Mild esophagitis was found 39 cm from the incisors, associated with some       what appeared to be inflammatory nodular tissue. Biopsies were taken       with a cold forceps for histology to ensure no dysplastic changes.      The exam of the esophagus was otherwise normal.      Patchy mild inflammation characterized by a few erosions and erythema       was found in the gastric body.      The exam of the stomach was otherwise normal.      Biopsies were taken with a cold forceps in the gastric body, at the       incisura and in the gastric antrum for Helicobacter pylori testing.      The duodenal bulb and second portion of the duodenum were normal. Impression:               -  Esophagogastric landmarks identified.                           - 2 cm hiatal hernia.                           - Esophagitis with some suspected associated                            inflammatory nodular changes at the GEJ. Biopsied.                           - Mild gastritis.                           - Normal stomach, biopsies taken to rule out H                            pylori                           - Normal  duodenal bulb and second portion of the                            duodenum.                           Colonic findings most likely cause of heme positive                            stool / anemia, but possible esophagitis /                            gastritis could be contributing Recommendation:           - Return patient to hospital ward for ongoing care.                           - Resume previous diet.                           - Continue present medications.                           - Start protonix 40mg  daily if okay with nephrology                           - Avoid NSAIDs                           - Await pathology results.                           - Full recommendations per colonoscopy note Procedure Code(s):        --- Professional ---                           [phone],  Esophagogastroduodenoscopy, flexible,                            transoral; with biopsy, single or multiple Diagnosis Code(s):        --- Professional ---                           K44.9, Diaphragmatic hernia without obstruction or                            gangrene                           K20.90, Esophagitis, unspecified without bleeding                           K29.70, Gastritis, unspecified, without bleeding                           R19.5, Other fecal abnormalities CPT copyright 2019 [employer] Medical [employer]. All rights reserved. The codes documented in this report are preliminary and upon coder review may  be revised to meet current compliance requirements. Remo Lipps P. Deaven Urwin, MD 02/18/2020 10:49:25 AM This report has been signed electronically. Number of Addenda: 0

## 2020-02-18 NOTE — Op Note (Addendum)
[facility] Patient Name: Paul Bond Procedure Date : 02/18/2020 MRN: [id number] Attending MD: Carlota Raspberry. Havery Moros , MD Date of Birth: 04-28-1941 CSN: [id number] Age: 79 Admit Type: Inpatient Procedure:                Colonoscopy Indications:              Heme positive stool, anemia (prior iron studies c/w                            anemia chronic disease), renal failure, reported                            history of colon cancer Providers:                Carlota Raspberry. Havery Moros, MD, Wynonia Sours, RN, Tyrone Apple, Technician, Larene Beach, CRNA Referring MD:              Medicines:                Monitored Anesthesia Care Complications:            No immediate complications. Estimated blood loss:                            Minimal. Estimated Blood Loss:     Estimated blood loss was minimal. Procedure:                Pre-Anesthesia Assessment:                           - Prior to the procedure, a History and Physical                            was performed, and patient medications and                            allergies were reviewed. The patient's tolerance of                            previous anesthesia was also reviewed. The risks                            and benefits of the procedure and the sedation                            options and risks were discussed with the patient.                            All questions were answered, and informed consent                            was obtained. Prior Anticoagulants: The patient has  taken no previous anticoagulant or antiplatelet                            agents. ASA Grade Assessment: III - A patient with                            severe systemic disease. After reviewing the risks                            and benefits, the patient was deemed in                            satisfactory condition to undergo the procedure.                            After obtaining informed consent, the colonoscope                            was passed under direct vision. Throughout the                            procedure, the patient's blood pressure, pulse, and                            oxygen saturations were monitored continuously. The                            CF-HQ190L ([id number]) Olympus colonoscope was                            introduced through the anus and advanced to the the                            cecum, identified by appendiceal orifice and                            ileocecal valve. The colonoscopy was performed                            without difficulty. The patient tolerated the                            procedure well. The quality of the bowel                            preparation was adequate. The ileocecal valve,                            appendiceal orifice, and rectum were photographed. Scope In: 9:43:59 AM Scope Out: 10:23:54 AM Scope Withdrawal Time: 0 hours 34 minutes 43 seconds  Total Procedure Duration: 0 hours 39 minutes 55 seconds  Findings:      The perianal and digital rectal examinations were normal.      Two sessile polyps were found in the ascending colon.  The polyps were 3       mm in size. These polyps were removed with a cold snare. Resection and       retrieval were complete.      A 4 mm polyp was found in the hepatic flexure. The polyp was sessile.       The polyp was removed with a cold snare. Resection and retrieval were       complete.      A 8 mm polyp was found in the transverse colon. The polyp was sessile.       The polyp was removed with a cold snare. Resection and retrieval were       complete.      There was evidence of a surgical anastomosis in the proximal rectum.       There was a blind pouch adjacent to the anastomosis which appeared       normal. There was a roughly 5cm segment extending both proximal and       distal to the anastomosis with a few large areas of ulceration with        intervening normal appearing mucosa. No active bleeding or stigmata of       bleeding noted. Multiple biopsies were taken with a cold forceps for       histology.      The prep was initially fair. Several minutes spent lavaging the colon.       The cecum was angulated, unfortunately despite multiple attempts, ileal       intubation not successful. The exam was otherwise without abnormality.       Due to small rectal vault, retroflexed views not obtained. Impression:               - Two 3 mm polyps in the ascending colon, removed                            with a cold snare. Resected and retrieved.                           - One 4 mm polyp at the hepatic flexure, removed                            with a cold snare. Resected and retrieved.                           - One 8 mm polyp in the transverse colon, removed                            with a cold snare. Resected and retrieved.                           - Surgical anastomosis in proximal rectum, areas of                            ulceration as outlined. Biopsied.                           - The examination was otherwise normal.  Ulceration in proximal rectum / recto sigmoid colon                            near anastomosis likely cause of heme positive                            stool and contributing to anemia. DDx includes                            focal ischemic change, stercoral ulceration (if                            history of constipation), viral, Crohn's, NSAID                            induced, etc. Mucosal did not overtly appear                            malignant. Will await biopsies. Recommendation:           - Return patient to hospital ward for ongoing care.                           - Resume previous diet.                           - Continue present medications.                           - Await pathology results with further                            recommendations                            - Avoid NSAIDs                           - Call with questions, we will re-evaluate the                            patien tomorrow, hopefully pathology results back                            tomorrow Procedure Code(s):        --- Professional ---                           [phone], Colonoscopy, flexible; with removal of                            tumor(s), polyp(s), or other lesion(s) by snare                            technique  19417, 5, Colonoscopy, flexible; with biopsy,                            single or multiple Diagnosis Code(s):        --- Professional ---                           K63.5, Polyp of colon                           Z98.0, Intestinal bypass and anastomosis status                           R19.5, Other fecal abnormalities CPT copyright 2019 American Medical Association. All rights reserved. The codes documented in this report are preliminary and upon coder review may  be revised to meet current compliance requirements. Remo Lipps P. Ledger Heindl, MD 02/18/2020 10:42:17 AM This report has been signed electronically. Number of Addenda: 0

## 2020-02-18 NOTE — Interval H&P Note (Signed)
History and Physical Interval Note:  02/18/2020 9:12 AM  Paul Bond  has presented today for surgery, with the diagnosis of anemia, FOBT +.  The various methods of treatment have been discussed with the patient and family. After consideration of risks, benefits and other options for treatment, the patient has consented to  Procedure(s): COLONOSCOPY WITH PROPOFOL (N/A) ESOPHAGOGASTRODUODENOSCOPY (EGD) WITH PROPOFOL (N/A) as a surgical intervention.  The patient's history has been reviewed, patient examined, no change in status, stable for surgery.  I have reviewed the patient's chart and labs.  Questions were answered to the patient's satisfaction.     [place]

## 2020-02-18 NOTE — Progress Notes (Signed)
Pt MEWS score turned yellow s/t slightly increased BP and HR.  Scheduled meds administered and yellow MEWS protocol implemented. Will continue to monitor.

## 2020-02-18 NOTE — Progress Notes (Signed)
Physical Therapy Treatment Patient Details Name: Paul Bond MRN: [id number] DOB: [birth year] Today's Date: 02/18/2020    History of Present Illness Paul Bond is a 79 y.o. male with medical history significant of history of cancer, diabetes, hypertension, stroke presenting to the ED via EMS from home for evaluation of failure to thrive.  Patient was hypotensive with EMS and given 800 cc normal saline in route. patient is normally quite physically active and cuts his own grass/does yard work.  He normally takes care of his wife as well.  For the past 3 weeks leading up to admission he has not been eating and has been bedbound; Principal problem -- Acute Kidney Injury with sepsis.  Pt s/p colonscopy and EGD 02/18/20    PT Comments    Pt making good progress today.  He was able to increase ambulation distance with min A for RW.  His HR did elevate to upper 120's and lower 130's consistently with ambulation and short time at 141 max. Further activity held due to tachycardia.  HR was back to 108 bpm once resting.     Follow Up Recommendations  SNF;Supervision for mobility/OOB     Equipment Recommendations  Rolling walker with 5" wheels;3in1 (PT)    Recommendations for Other Services       Precautions / Restrictions Precautions Precautions: Fall    Mobility  Bed Mobility Overal bed mobility: Needs Assistance Bed Mobility: Supine to Sit     Supine to sit: Min assist     General bed mobility comments: cues for rolling and pushing up with arms - increased time and assist with bed pad to scoot  Transfers Overall transfer level: Needs assistance Equipment used: Rolling walker (2 wheeled) Transfers: Sit to/from Stand Sit to Stand: Min assist         General transfer comment: Sit to stand x 2 with cues for safe hand placement  Ambulation/Gait Ambulation/Gait assistance: Min assist Gait Distance (Feet): 65 Feet (10' then 64') Assistive device: Rolling walker (2 wheeled) Gait  Pattern/deviations: Step-through pattern;Decreased stride length;Trunk flexed Gait velocity: decr   General Gait Details: Cues for posture and RW proximity.  Assist with RW around obstacles   Stairs             Wheelchair Mobility    Modified Rankin (Stroke Patients Only)       Balance Overall balance assessment: Needs assistance Sitting-balance support: Feet supported;No upper extremity supported Sitting balance-[name] Scale: Good     Standing balance support: Bilateral upper extremity supported;During functional activity Standing balance-[name] Scale: Poor Standing balance comment: Requires UE support and external support                            Cognition Arousal/Alertness: Awake/alert Behavior During Therapy: WFL for tasks assessed/performed Overall Cognitive Status: Within Functional Limits for tasks assessed                                 General Comments: Able to follow commands and answer questions appropriately      Exercises      General Comments General comments (skin integrity, edema, etc.): Pt's HR 108 rest and up to low 130's consistently with walking and 141 max.  Further activity limited due to HR.  O2 sats stable on RA.  BP 111/65 post walk      Pertinent Vitals/Pain Pain Assessment: No/denies pain  Home Living                      Prior Function            PT Goals (current goals can now be found in the care plan section) Acute Rehab PT Goals Patient Stated Goal: get better soon PT Goal Formulation: With patient Time For Goal Achievement: 02/21/20 Potential to Achieve Goals: Good Progress towards PT goals: Progressing toward goals    Frequency    Min 3X/week      PT Plan Current plan remains appropriate    Co-evaluation              AM-PAC PT "6 Clicks" Mobility   Outcome Measure  Help needed turning from your back to your side while in a flat bed without using bedrails?: A  Auld Help needed moving from lying on your back to sitting on the side of a flat bed without using bedrails?: A Goedken Help needed moving to and from a bed to a chair (including a wheelchair)?: A Resende Help needed standing up from a chair using your arms (e.g., wheelchair or bedside chair)?: A Seufert Help needed to walk in hospital room?: A Bielicki Help needed climbing 3-5 steps with a railing? : A Lizaola 6 Click Score: 18    End of Session Equipment Utilized During Treatment: Gait belt Activity Tolerance: Patient tolerated treatment well Patient left: with call bell/phone within reach;with chair alarm set;in chair Nurse Communication: Mobility status PT Visit Diagnosis: Unsteadiness on feet (R26.81);Other abnormalities of gait and mobility (R26.89);Muscle weakness (generalized) (M62.81)     Time: 6015-6153 PT Time Calculation (min) (ACUTE ONLY): 24 min  Charges:  $Gait Training: 8-22 mins $Therapeutic Activity: 8-22 mins                     Abran Richard, PT Acute Rehab Services Pager [phone] [facility] 02/18/2020, 1:02 PM

## 2020-02-18 NOTE — Transfer of Care (Signed)
Immediate Anesthesia Transfer of Care Note  Patient: Paul Bond  Procedure(s) Performed: COLONOSCOPY WITH PROPOFOL (N/A ) ESOPHAGOGASTRODUODENOSCOPY (EGD) WITH PROPOFOL (N/A ) POLYPECTOMY BIOPSY  Patient Location: Endoscopy Unit  Anesthesia Type:MAC  Level of Consciousness: awake, sedated and patient cooperative  Airway & Oxygen Therapy: Patient Spontanous Breathing  Post-op Assessment: Report given to RN, Post -op Vital signs reviewed and stable and Patient moving all extremities X 4  Post vital signs: Reviewed and stable  Last Vitals:  Vitals Value Taken Time  BP    Temp    Pulse 101 02/18/20 1038  Resp 18 02/18/20 1038  SpO2 95 % 02/18/20 1038  Vitals shown include unvalidated device data.  Last Pain:  Vitals:   02/18/20 0751  TempSrc: Oral  PainSc: 0-No pain      Patients Stated Pain Goal: 0 (86/77/37 3668)  Complications: No complications documented.

## 2020-02-18 NOTE — Progress Notes (Signed)
Pt transferred to colonoscopy via bed with staff. CCMD notified.

## 2020-02-18 NOTE — Anesthesia Procedure Notes (Signed)
Procedure Name: MAC Date/Time: 02/18/2020 9:18 AM Performed by: Darletta Moll, CRNA Pre-anesthesia Checklist: Patient identified, Emergency Drugs available, Suction available and Patient being monitored Patient Re-evaluated:Patient Re-evaluated prior to induction Oxygen Delivery Method: Simple face mask

## 2020-02-18 NOTE — Progress Notes (Signed)
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 CT abdomen pelvis with severely atrophic left kidney and right kidney has moderate hydroureteronephrosis. Urology was consulted and had stent placement on 8/25. Creatinine appears to have stalled around about 6 mg/dL but has improved with 1/2 NS to 5.37.  Still having post-obstructive diuresis.  There are no signs of uremia and no indication for dialysis at present.  Follow closely.  Discussed with pt and friend at BS--> increase oral intake, will see if can come off IVFs today.    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    #Solitary kidney with right hydronephrosis: Seen by urology and underwent cystoscopy and placement of double J stent.  #Hyperkalemia.  Potassium improved, off Lokelma  #Hypernatremia, hypovolemic:Improved on 1/2 NS  #Anion gap metabolic acidosis: This is improved  #Hypertension: Hypertension appears well-controlled   #Failure to thrive/weight loss, severe protein calorie malnutrition: Multiple etiology.  Continue oral supplement and management of renal failure as above.  #Hyperphosphatemia we will start binders Renvela 800 mg with meals.   #Anemia acute blood loss anemia being followed by gastroenterology.EGD/ Cscope showing esophagitis with nodular changes and ulceration in prox rectum, biopsies taken   Aranesp 150 q week   Subjective:    S/p EGD and c-scope.  Eating lunch.  4.5L UOP yesterday   Objective:   BP (!) 153/75 (BP Location: Left Arm)   Pulse 92   Temp 97.6 F (36.4 C) (Oral)   Resp 16   Ht 5\' 7"  (1.702 m)   Wt 67.5 kg   SpO2 100%   BMI 23.31 kg/m   Intake/Output Summary (Last 24 hours) at 02/18/2020 1224 Last data filed at 02/18/2020 1100 Gross per 24 hour  Intake 895 ml  Output  4500 ml  Net -3605 ml   Weight change:   Physical Exam: Gen: NAD, sitting in bed NECK: no JVD CVS: RRR Resp: clear, respirations nonlabored Abd: nontender Ext: no LE edema  Imaging: No results found.  Labs: BMET Recent Labs  Lab 02/13/20 0130 02/13/20 0130 02/13/20 0553 02/13/20 0956 02/13/20 2014 02/13/20 2014 02/14/20 0342 02/14/20 0657 02/14/20 1429 02/14/20 1815 02/14/20 2150 02/15/20 0219 02/16/20 0226 02/17/20 0435 02/18/20 0458  NA 138   < > 137  --  144  --  145  --   --   --   --  147* 137 137 140  K 6.3*   < > 6.4*   < > 5.2*   < > 4.8   < > 3.5 3.8 4.0 3.7 3.7 4.3 4.1  CL 99   < > 102  --  107  --  107  --   --   --   --  110 103 104 105  CO2 22   < > 23  --  23  --  21*  --   --   --   --  22 22 21* 19*  GLUCOSE 123*   < > 140*  --  131*  --  200*  --   --   --   --  155* 118* 121* 93  BUN 94*   < > 92*  --  85*  --  86*  --   --   --   --  83* 77* 73* 61*  CREATININE  6.03*   < > 5.90*  --  6.10*  --  6.05*  --   --   --   --  5.86* 5.37* 5.30* 5.09*  CALCIUM 8.9   < > 8.6*  --  8.8*  --  8.7*  --   --   --   --  8.2* 8.0* 8.3* 8.6*  PHOS 1.8*  --   --   --  7.0*  --  7.2*  --   --   --   --  6.7* 5.3* 4.9* 5.1*   < > = values in this interval not displayed.   CBC Recent Labs  Lab 02/15/20 1028 02/16/20 0226 02/17/20 0435 02/18/20 0458  WBC 17.7* 12.3* 11.2* 11.2*  NEUTROABS 15.7* 10.1* 8.4* 8.3*  HGB 8.0* 8.0* 7.6* 8.5*  HCT 25.4* 25.3* 24.6* 26.4*  MCV 91.4 93.4 93.5 92.3  PLT 265 272 274 355    Medications:    . Chlorhexidine Gluconate Cloth  6 each Topical Daily  . darbepoetin (ARANESP) injection - NON-DIALYSIS  150 mcg Subcutaneous Q Sun-1800  . diltiazem  60 mg Oral Q8H  . insulin aspart  0-6 Units Subcutaneous TID AC & HS  . insulin glargine  10 Units Subcutaneous Daily  . multivitamin with minerals  1 tablet Oral Daily  . sevelamer carbonate  800 mg Oral TID WC  . tamsulosin  0.4 mg Oral Daily      Madelon Lips,  MD 02/18/2020, 12:24 PM

## 2020-02-18 NOTE — Anesthesia Postprocedure Evaluation (Signed)
Anesthesia Post Note  Patient: Korbyn Chopin Santosuosso  Procedure(s) Performed: COLONOSCOPY WITH PROPOFOL (N/A ) ESOPHAGOGASTRODUODENOSCOPY (EGD) WITH PROPOFOL (N/A ) POLYPECTOMY BIOPSY     Patient location during evaluation: PACU Anesthesia Type: MAC Level of consciousness: awake and alert Pain management: pain level controlled Vital Signs Assessment: post-procedure vital signs reviewed and stable Respiratory status: spontaneous breathing, nonlabored ventilation and respiratory function stable Cardiovascular status: stable and blood pressure returned to baseline Anesthetic complications: no   No complications documented.  Last Vitals:  Vitals:   02/18/20 1050 02/18/20 1127  BP: (!) 150/67 (!) 153/75  Pulse: 99 92  Resp: 15 16  Temp:  36.4 C  SpO2: 97% 100%    Last Pain:  Vitals:   02/18/20 1127  TempSrc: Oral  PainSc: 0-No pain                 Audry Pili

## 2020-02-19 DIAGNOSIS — K626 Ulcer of anus and rectum: Secondary | ICD-10-CM

## 2020-02-19 DIAGNOSIS — K529 Noninfective gastroenteritis and colitis, unspecified: Secondary | ICD-10-CM

## 2020-02-19 LAB — CBC WITH DIFFERENTIAL/PLATELET
Abs Immature Granulocytes: 0.54 10*3/uL — ABNORMAL HIGH (ref 0.00–0.07)
Basophils Absolute: 0 10*3/uL (ref 0.0–0.1)
Basophils Relative: 0 %
Eosinophils Absolute: 0.2 10*3/uL (ref 0.0–0.5)
Eosinophils Relative: 1 %
HCT: 24.1 % — ABNORMAL LOW (ref 39.0–52.0)
Hemoglobin: 7.5 g/dL — ABNORMAL LOW (ref 13.0–17.0)
Immature Granulocytes: 4 %
Lymphocytes Relative: 11 %
Lymphs Abs: 1.6 10*3/uL (ref 0.7–4.0)
MCH: 28.5 pg (ref 26.0–34.0)
MCHC: 31.1 g/dL (ref 30.0–36.0)
MCV: 91.6 fL (ref 80.0–100.0)
Monocytes Absolute: 1.2 10*3/uL — ABNORMAL HIGH (ref 0.1–1.0)
Monocytes Relative: 9 %
Neutro Abs: 10.4 10*3/uL — ABNORMAL HIGH (ref 1.7–7.7)
Neutrophils Relative %: 75 %
Platelets: 328 10*3/uL (ref 150–400)
RBC: 2.63 MIL/uL — ABNORMAL LOW (ref 4.22–5.81)
RDW: 15.2 % (ref 11.5–15.5)
WBC: 13.9 10*3/uL — ABNORMAL HIGH (ref 4.0–10.5)
nRBC: 0.2 % (ref 0.0–0.2)

## 2020-02-19 LAB — RENAL FUNCTION PANEL
Albumin: 2.1 g/dL — ABNORMAL LOW (ref 3.5–5.0)
Anion gap: 15 (ref 5–15)
BUN: 56 mg/dL — ABNORMAL HIGH (ref 8–23)
CO2: 17 mmol/L — ABNORMAL LOW (ref 22–32)
Calcium: 8.6 mg/dL — ABNORMAL LOW (ref 8.9–10.3)
Chloride: 107 mmol/L (ref 98–111)
Creatinine, Ser: 5.04 mg/dL — ABNORMAL HIGH (ref 0.61–1.24)
GFR calc Af Amer: 12 mL/min — ABNORMAL LOW (ref >60)
GFR calc non Af Amer: 10 mL/min — ABNORMAL LOW (ref >60)
Glucose, Bld: 65 mg/dL — ABNORMAL LOW (ref 70–99)
Phosphorus: 4.7 mg/dL — ABNORMAL HIGH (ref 2.5–4.6)
Potassium: 4.1 mmol/L (ref 3.5–5.1)
Sodium: 139 mmol/L (ref 135–145)

## 2020-02-19 LAB — GLUCOSE, CAPILLARY
Glucose-Capillary: 54 mg/dL — ABNORMAL LOW (ref 70–99)
Glucose-Capillary: 85 mg/dL (ref 70–99)
Glucose-Capillary: 148 mg/dL — ABNORMAL HIGH (ref 70–99)
Glucose-Capillary: 189 mg/dL — ABNORMAL HIGH (ref 70–99)
Glucose-Capillary: 216 mg/dL — ABNORMAL HIGH (ref 70–99)

## 2020-02-19 MED ORDER — SODIUM BICARBONATE 650 MG PO TABS
650.0000 mg | ORAL_TABLET | Freq: Two times a day (BID) | ORAL | Status: DC
  Administered 2020-02-19 – 2020-02-21 (×5): 650 mg via ORAL
  Filled 2020-02-19 (×5): qty 1

## 2020-02-19 MED ORDER — MESALAMINE 1.2 G PO TBEC: 2.4000 g | DELAYED_RELEASE_TABLET | Freq: Every day | ORAL | Status: DC

## 2020-02-19 MED ORDER — SODIUM CHLORIDE 0.9 % IV SOLN
510.0000 mg | Freq: Once | INTRAVENOUS | Status: AC
  Administered 2020-02-19: 510 mg via INTRAVENOUS
  Filled 2020-02-19: qty 17

## 2020-02-19 MED ORDER — SUCRALFATE 1 GM/10ML PO SUSP
2.0000 g | Freq: Two times a day (BID) | ORAL | Status: DC
  Administered 2020-02-19 – 2020-02-21 (×5): 2 g via RECTAL
  Filled 2020-02-19 (×5): qty 20

## 2020-02-19 MED ORDER — INSULIN GLARGINE 100 UNIT/ML ~~LOC~~ SOLN
5.0000 [IU] | Freq: Every day | SUBCUTANEOUS | Status: DC
  Administered 2020-02-20 – 2020-02-21 (×2): 5 [IU] via SUBCUTANEOUS
  Filled 2020-02-19 (×2): qty 0.05
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
## 2020-02-19 NOTE — Progress Notes (Signed)
Inpatient Diabetes Program Recommendations  AACE/ADA: New Consensus Statement on Inpatient Glycemic Control (2015)  Target Ranges:  Prepandial:   less than 140 mg/dL      Peak postprandial:   less than 180 mg/dL (1-2 hours)      Critically ill patients:  140 - 180 mg/dL   Lab Results  Component Value Date   GLUCAP 85 02/19/2020   HGBA1C 7.9 (H) 02/02/2020    Review of Glycemic Control Results for Paul Bond, Paul Bond (MRN [id number]) as of 02/19/2020 09:40  Ref. Range 02/18/2020 06:27 02/18/2020 09:54 02/18/2020 11:05 02/18/2020 15:53 02/18/2020 21:15 02/19/2020 06:07 02/19/2020 06:32  Glucose-Capillary Latest Ref Range: 70 - 99 mg/dL 83 86 79 153 (H) 101 (H) 54 (L) 85   Diabetes history: DM 2 Outpatient Diabetes medications: Lantus 18 units Daily Current orders for Inpatient glycemic control:  Lantus 10 units Novolog 0-6 units 4x/day  A1c 7.9% 8/24  Inpatient Diabetes Program Recommendations:    Hypoglycemia int he 50's this am.  -  Reduce Lantus to 5 units.  Thanks,  Tama Headings RN, MSN, BC-ADM Inpatient Diabetes Coordinator Team Pager [phone] (8a-5p)

## 2020-02-19 NOTE — Progress Notes (Signed)
Progress Note  Chief Complaint:   Anemia, heme positive stool      ASSESSMENT / PLAN:    Brief history:  79 yo male with hx of colon cancer s/p sigmoid colectomy, diabetes, HTN, CVA. Admitted with FTT, AKI onn CKD with obstructive uropathy, s/p cystoscopy with stent placement. Hospital course complicated by worsening anemia and sepsis.    #  [place] Anemia probably due to rectal ulceration.  --no evidence for IDA --Transfused a unit of blood on 9/4 for hgb of 6.5. Since then hgb fluctuating within mid 7 to mid 8 range --Will give dose of IV Fe. I spoke with PharmD. I will order the 510 mg . He can get a 2nd dose in 3 days if still in hospital.   # Rectal ulceration near anastomosis --Interestingly the path shows active colits. Presentation wasn't really that of IBD ( not much diarrhea and no overt bleeding).  --Initially we planned to start mesalamine 2.4 grams (unless Nephrology opposes it) but after further though we may do carafate enemas instead. This will facilitate healing until we can further sort things out. --He needs follow up flexible sigmoidoscopy in ~ 6 weeks but should be seen in office prior. I made a follow up for 10/7 at 1:30pm.  However patient will need to get a referral from [place] to be seen    # Mild gastritis.  --Bx c/w with mild reactive change. No H.pylori.  --Continue Protonix 40 mg daily if okay with Nephrology  # Colon polyps. Path c/w adenomas. Will mail letter to patient with recommendations regarding surveillance colonoscopy   # Severe sepsis due to Morganella bacteremia / UTI. On Zosyn   EGD 9/9 -Esophagogastric landmarks identified. - 2 cm hiatal hernia. - Esophagitis with some suspected associated inflammatory nodular changes at the GEJ. Biopsied. - Mild gastritis. - Normal stomach, biopsies taken to rule out H pylori - Normal duodenal bulb and second portion of the duodenum.   Colonoscopy 9/9 -Two 3 mm polyps in the ascending colon, removed  with a cold snare. Resected and retrieved. - One 4 mm polyp at the hepatic flexure, removed with a cold snare. Resected and retrieved. - One 8 mm polyp in the transverse colon, removed with a cold snare. Resected and retrieved. - Surgical anastomosis in proximal rectum, areas of ulceration as outlined. Biopsied. - The examination was otherwise normal. Ulceration in proximal rectum / recto sigmoid colon near anastomosis likely cause of heme positive stool and contributing to anemia. DDx includes focal ischemic change, stercoral ulceration (if history of constipation), viral, Crohn's, NSAID induced, etc. Mucosal did not overtly appear malignant. Will await biopsies.  FINAL MICROSCOPIC DIAGNOSIS:   A. STOMACH, BIOPSY:  - Benign gastric mucosa with mild reactive changes  - No H. pylori, intestinal metaplasia or malignancy identified   B. ESOPHAGUS, BIOPSY:  - Benign gastric type mucosa with hyperplastic changes  - No squamous mucosa or intestinal metaplasia identified   C. COLON, ASCENDING, HEPATIC, AND TRANSVERSE, POLYPECTOMY:  - Multiple fragments of tubular adenoma(s)  - No high grade dysplasia or malignancy identified   D. COLON, BIOPSY:  - Active colitis  - No granulomata, dysplasia or malignancy identified  - See comment   COMMENT:   A. Warthin-Starry stain is NEGATIVE for organisms morphologically  consistent with Helicobacter pylori.   D. CMV and HSV 1/2 immunohistochemistry is pending and will be reported  in an addendum.        SUBJECTIVE:   Feels okay,  no complaints    OBJECTIVE:    Scheduled inpatient medications:  . Chlorhexidine Gluconate Cloth  6 each Topical Daily  . darbepoetin (ARANESP) injection - NON-DIALYSIS  150 mcg Subcutaneous Q Sun-1800  . diltiazem  60 mg Oral Q8H  . insulin aspart  0-6 Units Subcutaneous TID AC & HS  . insulin glargine  10 Units Subcutaneous Daily  . multivitamin with minerals  1 tablet Oral Daily  . sevelamer  carbonate  800 mg Oral TID WC  . sodium bicarbonate  650 mg Oral BID  . tamsulosin  0.4 mg Oral Daily   Continuous inpatient infusions:  . piperacillin-tazobactam (ZOSYN)  IV 2.25 g (02/19/20 0518)   PRN inpatient medications: acetaminophen **OR** acetaminophen  Vital signs in last 24 hours: Temp:  [97.6 F (36.4 C)-99 F (37.2 C)] 98.4 F (36.9 C) (09/10 0912) Pulse Rate:  [82-115] 91 (09/10 0912) Resp:  [12-20] 12 (09/10 0912) BP: (123-153)/(62-80) 131/77 (09/10 0913) SpO2:  [98 %-100 %] 98 % (09/10 0912) Last BM Date: 02/18/20  Intake/Output Summary (Last 24 hours) at 02/19/2020 1110 Last data filed at 02/18/2020 1847 Gross per 24 hour  Intake 68.97 ml  Output 800 ml  Net -731.03 ml     Physical Exam:  . General: Alert male in NAD . Heart:  Regular rate and rhythm. No murmur. No lower extremity edema . Pulmonary: Normal respiratory effort . Abdomen: Soft, nondistended, Nontender. Normal bowel sounds. No masses felt. . Neurologic: Alert and oriented . Psych: Pleasant. Cooperative.   Filed Weights   02/03/20 2213 02/05/20 0411 02/07/20 0550  Weight: 66.9 kg 66.5 kg 67.5 kg    Intake/Output from previous day: 09/09 0701 - 09/10 0700 In: 69 [IV Piggyback:69] Out: 1800 [Urine:1800] Intake/Output this shift: No intake/output data recorded.    Lab Results: Recent Labs    02/17/20 0435 02/18/20 0458 02/19/20 0246  WBC 11.2* 11.2* 13.9*  HGB 7.6* 8.5* 7.5*  HCT 24.6* 26.4* 24.1*  PLT 274 355 328   BMET Recent Labs    02/17/20 0435 02/18/20 0458 02/19/20 0246  NA 137 140 139  K 4.3 4.1 4.1  CL 104 105 107  CO2 21* 19* 17*  GLUCOSE 121* 93 65*  BUN 73* 61* 56*  CREATININE 5.30* 5.09* 5.04*  CALCIUM 8.3* 8.6* 8.6*   LFT Recent Labs    02/19/20 0246  ALBUMIN 2.1*   PT/INR No results for input(s): LABPROT, INR in the last 72 hours. Hepatitis Panel No results for input(s): HEPBSAG, HCVAB, HEPAIGM, HEPBIGM in the last 72 hours.  No results  found.    Principal Problem:   AKI (acute kidney injury) ([place]) Active Problems:   Hyperkalemia   Hypermagnesemia   Hypernatremia   Metabolic acidosis   Protein-calorie malnutrition, severe   Obstructive uropathy   Solitary kidney   Severe sepsis (HCC)   Heme positive stool   Anemia   Benign neoplasm of colon   Colonic ulcer   Acute esophagitis     LOS: 17 days   Paul Bond ,NP 02/19/2020, 11:10 AM

## 2020-02-19 NOTE — Progress Notes (Signed)
Physical Therapy Treatment Patient Details Name: Paul Bond MRN: [id number] DOB: [birth year] Today's Date: 02/19/2020    History of Present Illness Paul Bond is a 79 y.o. male with medical history significant of history of cancer, diabetes, hypertension, stroke presenting to the ED via EMS from home for evaluation of failure to thrive.  Patient was hypotensive with EMS and given 800 cc normal saline in route. patient is normally quite physically active and cuts his own grass/does yard work.  He normally takes care of his wife as well.  For the past 3 weeks leading up to admission he has not been eating and has been bedbound; Principal problem -- Acute Kidney Injury with sepsis.  Pt s/p colonscopy and EGD 02/18/20    PT Comments    Pt was limited today due to tachycardia and orthostatic hypotension.  He ambulated short distance in the room with min A and cues.  Required increased time for vital monitoring and having to perform ADLs multiple times due to incontinence. Cont POC  HR 130's-141 in standing; 102-120 sitting/laying BP sitting 111/64 BP standing 64/69 BP supine 120/63    Follow Up Recommendations  SNF;Supervision for mobility/OOB     Equipment Recommendations  Rolling walker with 5" wheels;3in1 (PT)    Recommendations for Other Services       Precautions / Restrictions Precautions Precautions: Fall Precaution Comments: orthostatic, watch HR    Mobility  Bed Mobility Overal bed mobility: Needs Assistance Bed Mobility: Supine to Sit;Sit to Supine     Supine to sit: Min assist Sit to supine: Min assist      Transfers Overall transfer level: Needs assistance Equipment used: Rolling walker (2 wheeled) Transfers: Sit to/from [facility] Sit to Stand: Min assist Stand pivot transfers: Min assist       General transfer comment: Sit to stand x 8 throughout session and 2 stand pivots; cues for safe hand placement and min A to boost  up  Ambulation/Gait Ambulation/Gait assistance: Min assist Gait Distance (Feet): 15 Feet (15', 5', 5') Assistive device: Rolling walker (2 wheeled) Gait Pattern/deviations: Step-through pattern;Decreased stride length;Trunk flexed     General Gait Details: Cues for posture and RW proximity.  Assist with RW around obstacles; limited due to orthostatic hypotensions   Stairs             Wheelchair Mobility    Modified Rankin (Stroke Patients Only)       Balance Overall balance assessment: Needs assistance Sitting-balance support: Feet supported;No upper extremity supported Sitting balance-[name] Scale: Good     Standing balance support: Bilateral upper extremity supported;During functional activity Standing balance-[name] Scale: Poor Standing balance comment: Requires UE support and external support- some limitations due to orthostatic hypotension                            Cognition Arousal/Alertness: Awake/alert Behavior During Therapy: WFL for tasks assessed/performed Overall Cognitive Status: Within Functional Limits for tasks assessed                                 General Comments: Able to follow commands and answer questions appropriately      Exercises      General Comments General comments (skin integrity, edema, etc.): Pt ambulated to bathroom, had BM and performed ADLs independently.  Then ambulated to sink to wash hands, but pt with difficulty sequencing to  wash hands and knees buckling.   Grabbed a chair for pt to sit HR up to 136-141 bpm.  Slid chair closer to bed and checked BP and was 111/64 HR back to 120s.  Had pt stand and take a few steps to bed , checked in standing and BP 64/49 and HR back to upper 130's.  Returned pt to sitting and BP 120/63 and HR 110 bpm.  Had to perform stand pivot to chair due to incontinence when sitting and had to change sheets.  Stand pivot back to bed and supine.  BP and HR stable at that time.  Notified RN.      Pertinent Vitals/Pain Pain Assessment: No/denies pain    Home Living                      Prior Function            PT Goals (current goals can now be found in the care plan section) Acute Rehab PT Goals Patient Stated Goal: get better soon PT Goal Formulation: With patient Time For Goal Achievement: 02/21/20 Potential to Achieve Goals: Good Progress towards PT goals: Progressing toward goals    Frequency    Min 3X/week      PT Plan Current plan remains appropriate    Co-evaluation              AM-PAC PT "6 Clicks" Mobility   Outcome Measure  Help needed turning from your back to your side while in a flat bed without using bedrails?: A Dawes Help needed moving from lying on your back to sitting on the side of a flat bed without using bedrails?: A [place]an Help needed moving to and from a bed to a chair (including a wheelchair)?: A Rizo Help needed standing up from a chair using your arms (e.g., wheelchair or bedside chair)?: A Tankard Help needed to walk in hospital room?: A Sonnier Help needed climbing 3-5 steps with a railing? : A Lot 6 Click Score: 17    End of Session Equipment Utilized During Treatment: Gait belt Activity Tolerance: Other (comment) (limited by tachy and orthostatic) Patient left: with call bell/phone within reach;in bed;with bed alarm set Nurse Communication: Mobility status (Had BM, orthostatic hypotension) PT Visit Diagnosis: Unsteadiness on feet (R26.81);Other abnormalities of gait and mobility (R26.89);Muscle weakness (generalized) (M62.81)     Time: 8144-8185 PT Time Calculation (min) (ACUTE ONLY): 44 min  Charges:  $Gait Training: 8-22 mins $Therapeutic Activity: 23-37 mins                     Abran Richard, PT Acute Rehab Services Pager [phone] [facility] 02/19/2020, 11:48 AM

## 2020-02-19 NOTE — Progress Notes (Signed)
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 CT abdomen pelvis with severely atrophic left kidney and right kidney has moderate hydroureteronephrosis. Urology was consulted and had stent placement on 8/25. Required IVFs for post-obstructive diuresis.  Stopped 9/9.  Maintaining Cr as of today at 5.  He is not uremic and there are no indications for dialysis.  Would leave off IVFs for now and see if he can maintain oral intake.  If not, would place back on 1/2 NS.  No further recommendations, will sign off.  Call with questions.  I will help arrange followup in 3-4 weeks in our clinic.        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    #Solitary kidney with right hydronephrosis: Seen by urology and underwent cystoscopy and placement of double J stent.  #Hyperkalemia.  Potassium improved, off Lokelma  #Hypernatremia, hypovolemic:Improved s/p 1/2 NS  #Anion gap metabolic acidosis: on sodium bicarb 650 mg BID  #Hypertension: Hypertension appears well-controlled   #Failure to thrive/weight loss, severe protein calorie malnutrition: Multiple etiology.  Continue oral supplement and management of renal failure as above.  #Hyperphosphatemia we will start binders Renvela 800 mg with meals.   #Anemia acute blood loss anemia being followed by gastroenterology.EGD/ Cscope showing esophagitis with nodular changes and ulceration in prox rectum, biopsies taken   Aranesp 150 q week   Subjective:       Objective:   BP 131/77   Pulse 91   Temp 98.4 F (36.9 C) (Oral)   Resp 12   Ht 5\' 7"  (1.702 m)   Wt 67.5 kg   SpO2 98%   BMI 23.31 kg/m   Intake/Output Summary (Last 24 hours) at 02/19/2020 1110 Last data filed at 02/18/2020 1847 Gross per 24 hour  Intake 68.97 ml  Output 800  ml  Net -731.03 ml   Weight change:   Physical Exam: Gen: NAD, sitting in bed NECK: no JVD CVS: RRR Resp: clear, respirations nonlabored Abd: nontender Ext: no LE edema  Imaging: No results found.  Labs: BMET Recent Labs  Lab 02/13/20 2014 02/13/20 2014 02/14/20 0342 02/14/20 7096 02/14/20 1815 02/14/20 2150 02/15/20 2836 02/16/20 0226 02/17/20 0435 02/18/20 0458 02/19/20 0246  NA 144  --  145  --   --   --  147* 137 137 140 139  K 5.2*   < > 4.8   < > 3.8 4.0 3.7 3.7 4.3 4.1 4.1  CL 107  --  107  --   --   --  110 103 104 105 107  CO2 23  --  21*  --   --   --  22 22 21* 19* 17*  GLUCOSE 131*  --  200*  --   --   --  155* 118* 121* 93 65*  BUN 85*  --  86*  --   --   --  83* 77* 73* 61* 56*  CREATININE 6.10*  --  6.05*  --   --   --  5.86* 5.37* 5.30* 5.09* 5.04*  CALCIUM 8.8*  --  8.7*  --   --   --  8.2* 8.0* 8.3* 8.6* 8.6*  PHOS 7.0*  --  7.2*  --   --   --  6.7* 5.3* 4.9* 5.1* 4.7*   < > =  values in this interval not displayed.   CBC Recent Labs  Lab 02/16/20 0226 02/17/20 0435 02/18/20 0458 02/19/20 0246  WBC 12.3* 11.2* 11.2* 13.9*  NEUTROABS 10.1* 8.4* 8.3* 10.4*  HGB 8.0* 7.6* 8.5* 7.5*  HCT 25.3* 24.6* 26.4* 24.1*  MCV 93.4 93.5 92.3 91.6  PLT 272 274 355 328    Medications:    . Chlorhexidine Gluconate Cloth  6 each Topical Daily  . darbepoetin (ARANESP) injection - NON-DIALYSIS  150 mcg Subcutaneous Q Sun-1800  . diltiazem  60 mg Oral Q8H  . insulin aspart  0-6 Units Subcutaneous TID AC & HS  . insulin glargine  10 Units Subcutaneous Daily  . multivitamin with minerals  1 tablet Oral Daily  . sevelamer carbonate  800 mg Oral TID WC  . sodium bicarbonate  650 mg Oral BID  . tamsulosin  0.4 mg Oral Daily      Madelon Lips, MD 02/19/2020, 11:10 AM

## 2020-02-19 NOTE — TOC Progression Note (Signed)
Transition of Care [facility]) - Progression Note    Patient Details  Name: Paul Bond MRN: [id number] Date of Birth: June 16, 1940  Transition of Care [facility]) CM/SW [place], [place] Phone Number: 02/19/2020, 4:53 PM  Clinical Narrative:     CSW spoke with Juliann Pulse with [facility] who confirmed that they do not have any SNF beds available at this time. Juliann Pulse will not have any beds available until sometime next week. CSW saw patient at bedside and explained that [facility] will not be able to offer a SNF bed at this time. Patient is in agreement to other SNF bed offers. Patient has had both Covid Vaccines. CSW let patient know she will call to follow up with other SNFs who have accepted to see who would be able to accept him for SNF if medically ready tomorrow. CSW let patient know that CSW will follow up with patient with bed offers.     Expected Discharge Plan: [place] Barriers to Discharge: Continued Medical Work up, SNF Pending bed offer  Expected Discharge Plan and Services Expected Discharge Plan: [place] In-house Referral: Clinical Social Work     Living arrangements for the past 2 months: Single Family Home                                       Social Determinants of Health (SDOH) Interventions    Readmission Risk Interventions No flowsheet data found.

## 2020-02-20 LAB — RENAL FUNCTION PANEL
Albumin: 2.2 g/dL — ABNORMAL LOW (ref 3.5–5.0)
Anion gap: 12 (ref 5–15)
BUN: 49 mg/dL — ABNORMAL HIGH (ref 8–23)
CO2: 22 mmol/L (ref 22–32)
Calcium: 8.9 mg/dL (ref 8.9–10.3)
Chloride: 105 mmol/L (ref 98–111)
Creatinine, Ser: 5.11 mg/dL — ABNORMAL HIGH (ref 0.61–1.24)
GFR calc Af Amer: 11 mL/min — ABNORMAL LOW (ref >60)
GFR calc non Af Amer: 10 mL/min — ABNORMAL LOW (ref >60)
Glucose, Bld: 81 mg/dL (ref 70–99)
Phosphorus: 3.5 mg/dL (ref 2.5–4.6)
Potassium: 4.1 mmol/L (ref 3.5–5.1)
Sodium: 139 mmol/L (ref 135–145)

## 2020-02-20 LAB — CBC WITH DIFFERENTIAL/PLATELET
Abs Immature Granulocytes: 0.1 10*3/uL — ABNORMAL HIGH (ref 0.00–0.07)
Band Neutrophils: 2 %
Basophils Absolute: 0 10*3/uL (ref 0.0–0.1)
Basophils Relative: 0 %
Eosinophils Absolute: 0.2 10*3/uL (ref 0.0–0.5)
Eosinophils Relative: 2 %
HCT: 25.6 % — ABNORMAL LOW (ref 39.0–52.0)
Hemoglobin: 8.2 g/dL — ABNORMAL LOW (ref 13.0–17.0)
Lymphocytes Relative: 13 %
Lymphs Abs: 1.5 10*3/uL (ref 0.7–4.0)
MCH: 29.2 pg (ref 26.0–34.0)
MCHC: 32 g/dL (ref 30.0–36.0)
MCV: 91.1 fL (ref 80.0–100.0)
Metamyelocytes Relative: 1 %
Monocytes Absolute: 1.6 10*3/uL — ABNORMAL HIGH (ref 0.1–1.0)
Monocytes Relative: 14 %
Neutro Abs: 7.8 10*3/uL — ABNORMAL HIGH (ref 1.7–7.7)
Neutrophils Relative %: 68 %
Platelets: 400 10*3/uL (ref 150–400)
RBC: 2.81 MIL/uL — ABNORMAL LOW (ref 4.22–5.81)
RDW: 15.1 % (ref 11.5–15.5)
WBC: 11.2 10*3/uL — ABNORMAL HIGH (ref 4.0–10.5)
nRBC: 1 % — ABNORMAL HIGH (ref 0.0–0.2)

## 2020-02-20 LAB — GLUCOSE, CAPILLARY
Glucose-Capillary: 70 mg/dL (ref 70–99)
Glucose-Capillary: 160 mg/dL — ABNORMAL HIGH (ref 70–99)
Glucose-Capillary: 165 mg/dL — ABNORMAL HIGH (ref 70–99)
Glucose-Capillary: 213 mg/dL — ABNORMAL HIGH (ref 70–99)

## 2020-02-20 LAB — SARS CORONAVIRUS 2 BY RT PCR (HOSPITAL ORDER, PERFORMED IN ~~LOC~~ HOSPITAL LAB): SARS Coronavirus 2: NEGATIVE

## 2020-02-20 NOTE — Progress Notes (Signed)
Pharmacy Antibiotic Note  Paul Bond is a 79 y.o. male on day #8 Zosyn for Morganella bacteremia and UTI.  AKI due to obstructive uropathy, R ureteral stent placed 8/25. Solitary kidney. Creatinine has trended down slowly, now ~5. CrCl still ~20mL/min.   Afebrile, WBC and PCT improved.  Plan: Continue Zosyn 2.25 gm VI q8h. Monitor renal function and clinical progress Per note, plan for 10-14 days of zosyn with ciprofloxacin at d/c.  Height: 5\' 7"  (170.2 cm) Weight: 67.5 kg (148 lb 13 oz) IBW/kg (Calculated) : 66.1  Temp (24hrs), Avg:98.4 F (36.9 C), Min:98 F (36.7 C), Max:99.1 F (37.3 C)  Recent Labs  Lab 02/16/20 0226 02/17/20 0435 02/18/20 0458 02/19/20 0246 02/20/20 0142  WBC 12.3* 11.2* 11.2* 13.9* 11.2*  CREATININE 5.37* 5.30* 5.09* 5.04* 5.11*    Estimated Creatinine Clearance: 11 mL/min (A) (by C-G formula based on SCr of 5.11 mg/dL (H)).    No Known Allergies  Antimicrobials this admission:  Zosyn 9/4 >>  Dose adjustments this admission:  9/4: adjusted from 3.375 > 2.25 gm IV q8h for renal function  Microbiology results: 9/3 blood: Morganella morgani 9/3 urine: 100k Morganella morganii  8/24 urine: negative 8/24 COVID: negative  Thank you for allowing pharmacy to be a part of this patient's care.  Mercy Riding, PharmD PGY1 Acute Care Pharmacy Resident Please refer to [facility] for unit-specific pharmacist

## 2020-02-20 NOTE — Plan of Care (Signed)
  Problem: Health Behavior/Discharge Planning: Goal: Ability to manage health-related needs will improve Outcome: Progressing   

## 2020-02-20 NOTE — TOC Progression Note (Signed)
Transition of Care [facility]) - Progression Note    Patient Details  Name: Paul Bond MRN: [id number] Date of Birth: May 25, 1941  Transition of Care [facility]) CM/SW [place], [place] Phone Number: [phone] 02/20/2020, 11:19 AM  Clinical Narrative:     CSW spoke with patient about his bed offers and reiterated that [facility] could not offer a bed. Patient confirmed that he was aware that [facility] could not not offer a bed at this time. Patient chose [facility] and CSW spoke with Ronny Bacon from [place] and they are able to accept patient. CSW informed Ronny Bacon that a new COVID is needed prior to patient being admitted.  CSW alerted [facility] [place] [place] that a new COVID was needed and the RN was updated about upcoming discharge.  TOC team will continue to assist with discharge planning needs   Expected Discharge Plan: [place] Barriers to Discharge: Continued Medical Work up, SNF Pending bed offer  Expected Discharge Plan and Services Expected Discharge Plan: [place] In-house Referral: Clinical Social Work     Living arrangements for the past 2 months: Single Family Home                                       Social Determinants of Health (SDOH) Interventions    Readmission Risk Interventions No flowsheet data found.
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
## 2020-02-21 DIAGNOSIS — I480 Paroxysmal atrial fibrillation: Secondary | ICD-10-CM

## 2020-02-21 LAB — CBC WITH DIFFERENTIAL/PLATELET
Abs Immature Granulocytes: 1.89 10*3/uL — ABNORMAL HIGH (ref 0.00–0.07)
Basophils Absolute: 0.1 10*3/uL (ref 0.0–0.1)
Basophils Relative: 1 %
Eosinophils Absolute: 0.2 10*3/uL (ref 0.0–0.5)
Eosinophils Relative: 1 %
HCT: 23.7 % — ABNORMAL LOW (ref 39.0–52.0)
Hemoglobin: 7.6 g/dL — ABNORMAL LOW (ref 13.0–17.0)
Immature Granulocytes: 16 %
Lymphocytes Relative: 15 %
Lymphs Abs: 1.8 10*3/uL (ref 0.7–4.0)
MCH: 29.2 pg (ref 26.0–34.0)
MCHC: 32.1 g/dL (ref 30.0–36.0)
MCV: 91.2 fL (ref 80.0–100.0)
Monocytes Absolute: 1.1 10*3/uL — ABNORMAL HIGH (ref 0.1–1.0)
Monocytes Relative: 9 %
Neutro Abs: 7 10*3/uL (ref 1.7–7.7)
Neutrophils Relative %: 58 %
Platelets: 437 10*3/uL — ABNORMAL HIGH (ref 150–400)
RBC: 2.6 MIL/uL — ABNORMAL LOW (ref 4.22–5.81)
RDW: 15.3 % (ref 11.5–15.5)
WBC: 11.9 10*3/uL — ABNORMAL HIGH (ref 4.0–10.5)
nRBC: 5.2 % — ABNORMAL HIGH (ref 0.0–0.2)

## 2020-02-21 LAB — RENAL FUNCTION PANEL
Albumin: 2.1 g/dL — ABNORMAL LOW (ref 3.5–5.0)
Anion gap: 11 (ref 5–15)
BUN: 40 mg/dL — ABNORMAL HIGH (ref 8–23)
CO2: 21 mmol/L — ABNORMAL LOW (ref 22–32)
Calcium: 8.6 mg/dL — ABNORMAL LOW (ref 8.9–10.3)
Chloride: 104 mmol/L (ref 98–111)
Creatinine, Ser: 4.78 mg/dL — ABNORMAL HIGH (ref 0.61–1.24)
GFR calc Af Amer: 12 mL/min — ABNORMAL LOW (ref >60)
GFR calc non Af Amer: 11 mL/min — ABNORMAL LOW (ref >60)
Glucose, Bld: 115 mg/dL — ABNORMAL HIGH (ref 70–99)
Phosphorus: 2.9 mg/dL (ref 2.5–4.6)
Potassium: 4 mmol/L (ref 3.5–5.1)
Sodium: 136 mmol/L (ref 135–145)

## 2020-02-21 LAB — GLUCOSE, CAPILLARY
Glucose-Capillary: 108 mg/dL — ABNORMAL HIGH (ref 70–99)
Glucose-Capillary: 144 mg/dL — ABNORMAL HIGH (ref 70–99)

## 2020-02-21 LAB — STOOL CULTURE: E coli, Shiga toxin Assay: NEGATIVE

## 2020-02-21 LAB — STOOL CULTURE REFLEX - RSASHR

## 2020-02-21 LAB — STOOL CULTURE REFLEX - CMPCXR

## 2020-02-21 MED ORDER — INSULIN GLARGINE 100 UNIT/ML ~~LOC~~ SOLN: 5.0000 [IU] | Freq: Every day | SUBCUTANEOUS | 11 refills | Status: DC

## 2020-02-21 MED ORDER — SODIUM BICARBONATE 650 MG PO TABS: 650.0000 mg | ORAL_TABLET | Freq: Two times a day (BID) | ORAL | Status: DC

## 2020-02-21 MED ORDER — DILTIAZEM HCL ER COATED BEADS 180 MG PO CP24: 180.0000 mg | ORAL_CAPSULE | Freq: Every day | ORAL | Status: DC

## 2020-02-21 MED ORDER — SUCRALFATE 1 GM/10ML PO SUSP: 2.0000 g | Freq: Two times a day (BID) | ORAL | 0 refills | Status: DC

## 2020-02-21 MED ORDER — CIPROFLOXACIN HCL 500 MG PO TABS: 500.0000 mg | ORAL_TABLET | ORAL | Status: DC

## 2020-02-21 MED ORDER — ADULT MULTIVITAMIN W/MINERALS CH: 1.0000 | ORAL_TABLET | Freq: Every day | ORAL | Status: DC

## 2020-02-21 MED ORDER — TAMSULOSIN HCL 0.4 MG PO CAPS: 0.4000 mg | ORAL_CAPSULE | Freq: Every day | ORAL | Status: DC

## 2020-02-21 MED ORDER — SEVELAMER CARBONATE 800 MG PO TABS: 800.0000 mg | ORAL_TABLET | Freq: Three times a day (TID) | ORAL | Status: DC

## 2020-02-21 MED ORDER — CIPROFLOXACIN HCL 500 MG PO TABS: 500.0000 mg | ORAL_TABLET | ORAL | 0 refills | Status: AC

## 2020-02-21 NOTE — Plan of Care (Signed)
°  Problem: Clinical Measurements: °Goal: Will remain free from infection °Outcome: Progressing °Goal: Respiratory complications will improve °Outcome: Progressing °  °

## 2020-02-21 NOTE — Consult Note (Signed)
CARDIOLOGY CONSULT NOTE       Patient ID: Paul Bond MRN: [id number] DOB/AGE: Jun 24, 1940 79 y.o.  Admit date: 02/02/2020 Referring Physician: Maren Beach Primary Physician: Administration, Veterans Primary Cardiologist: New Reason for Consultation: PAF   Principal Problem:   AKI (acute kidney injury) [facility]) Active Problems:   Hyperkalemia   Hypermagnesemia   Hypernatremia   Metabolic acidosis   Protein-calorie malnutrition, severe   Obstructive uropathy   Solitary kidney   Severe sepsis (HCC)   Heme positive stool   Anemia   Benign neoplasm of colon   Colonic ulcer   Acute esophagitis   HPI:  79 y.o. asked to see for PAF. Complicated hospital stay with FTT, weight loss, urosepsis and solitary kidney with stenting of right ureter 8/25. Renal failure with Cr around 5 no plans for current dialysis History of HTN and stroke with right sided weakness in 2006. MRI with lacunar infarct and intracranial carotid disease not thought to be embolic CHADVASC 4. He has severe anemia Hb 7.6 today. EGD and colon this admission with  Polyps removed and some non bleeding ulceration at anastomotic site of previous colon resection for cancer. EGD with only mild gastritis. He had self limited PAF 8/25 and has been in NSR since. Today he is in NSR with rates 70-80 range  Echo this admission non diagnostic due to poor windows. Currently on oral cardizem   Prior to admission indicates he was active Lives with wife and still drives. Has had some weight loss    ROS All other systems reviewed and negative except as noted above  Past Medical History:  Diagnosis Date  . Cancer ([place])   . Diabetes mellitus without complication ([place])   . Hypertension   . Stroke [facility])     History reviewed. No pertinent family history.  Social History   Socioeconomic History  . Marital status: Married    Spouse name: Not on file  . Number of children: Not on file  . Years of education: Not on file  . Highest  education level: Not on file  Occupational History  . Not on file  Tobacco Use  . Smoking status: Former Research scientist (life sciences)  . Smokeless tobacco: Never Used  Vaping Use  . Vaping Use: Never used  Substance and Sexual Activity  . Alcohol use: Never  . Drug use: Never  . Sexual activity: Not on file  Other Topics Concern  . Not on file  Social History Narrative  . Not on file   Social Determinants of Health   Financial Resource Strain:   . Difficulty of Paying Living Expenses: Not on file  Food Insecurity:   . Worried About Charity fundraiser in the Last Year: Not on file  . Ran Out of Food in the Last Year: Not on file  Transportation Needs:   . Lack of Transportation (Medical): Not on file  . Lack of Transportation (Non-Medical): Not on file  Physical Activity:   . Days of Exercise per Week: Not on file  . Minutes of Exercise per Session: Not on file  Stress:   . Feeling of Stress : Not on file  Social Connections:   . Frequency of Communication with Friends and Family: Not on file  . Frequency of Social Gatherings with Friends and Family: Not on file  . Attends Religious Services: Not on file  . Active Member of Clubs or Organizations: Not on file  . Attends Archivist Meetings: Not on file  .  Marital Status: Not on file  Intimate Partner Violence:   . Fear of Current or Ex-Partner: Not on file  . Emotionally Abused: Not on file  . Physically Abused: Not on file  . Sexually Abused: Not on file    Past Surgical History:  Procedure Laterality Date  . COLON SURGERY    . COLONOSCOPY  02/18/2020  . CYSTOSCOPY W/ URETERAL STENT PLACEMENT Right 02/03/2020   Procedure: CYSTOSCOPY WITH RETROGRADE PYELOGRAM/URETERAL DOUBLE J STENT PLACEMENT;  Surgeon: [name], MD;  Location: [facility];  Service: Urology;  Laterality: Right;      Current Facility-Administered Medications:  .  acetaminophen (TYLENOL) tablet 650 mg, 650 mg, Oral, Q6H PRN, 650 mg at 02/17/20 0012 **OR**  acetaminophen (TYLENOL) suppository 650 mg, 650 mg, Rectal, Q6H PRN, [name], MD, 650 mg at 02/12/20 1941 .  Chlorhexidine Gluconate Cloth 2 % PADS 6 each, 6 each, Topical, Daily, Georgette Shell, MD, 6 each at 02/20/20 [phone] .  Darbepoetin Alfa (ARANESP) injection 150 mcg, 150 mcg, Subcutaneous, Q Sun-1800, Edrick Oh, MD, 150 mcg at 02/14/20 1954 .  diltiazem (CARDIZEM CD) 24 hr capsule 180 mg, 180 mg, Oral, Daily, Kc, Ramesh, MD .  insulin aspart (novoLOG) injection 0-6 Units, 0-6 Units, Subcutaneous, TID AC & HS, Kc, Ramesh, MD, 2 Units at 02/20/20 2125 .  insulin glargine (LANTUS) injection 5 Units, 5 Units, Subcutaneous, Daily, Kc, Ramesh, MD, 5 Units at 02/21/20 1041 .  multivitamin with minerals tablet 1 tablet, 1 tablet, Oral, Daily, Georgette Shell, MD, 1 tablet at 02/21/20 1041 .  piperacillin-tazobactam (ZOSYN) IVPB 2.25 g, 2.25 g, Intravenous, Q8H, Romilda Garret, [facility], Last Rate: 100 mL/hr at 02/21/20 0519, 2.25 g at 02/21/20 0519 .  sevelamer carbonate (RENVELA) tablet 800 mg, 800 mg, Oral, TID WC, Edrick Oh, MD, 800 mg at 02/21/20 0843 .  sodium bicarbonate tablet 650 mg, 650 mg, Oral, BID, Madelon Lips, MD, 650 mg at 02/21/20 1041 .  sucralfate (CARAFATE) 1 GM/10ML suspension 2 g, 2 g, Rectal, BID, Willia Craze, NP, 2 g at 02/21/20 1041 .  tamsulosin (FLOMAX) capsule 0.4 mg, 0.4 mg, Oral, Daily, [name], MD, 0.4 mg at 02/21/20 1041 . Chlorhexidine Gluconate Cloth  6 each Topical Daily  . darbepoetin (ARANESP) injection - NON-DIALYSIS  150 mcg Subcutaneous Q Sun-1800  . diltiazem  180 mg Oral Daily  . insulin aspart  0-6 Units Subcutaneous TID AC & HS  . insulin glargine  5 Units Subcutaneous Daily  . multivitamin with minerals  1 tablet Oral Daily  . sevelamer carbonate  800 mg Oral TID WC  . sodium bicarbonate  650 mg Oral BID  . sucralfate  2 g Rectal BID  . tamsulosin  0.4 mg Oral Daily   . piperacillin-tazobactam (ZOSYN)  IV 2.25  g (02/21/20 0519)    Physical Exam: Blood pressure 126/64, pulse 74, temperature 98.4 F (36.9 C), temperature source Oral, resp. rate 11, height 5\' 7"  (1.702 m), weight 67.5 kg, SpO2 99 %.   Affect appropriate Chronically ill black male  HEENT: normal Neck supple with no adenopathy JVP normal no bruits no thyromegaly Lungs clear with no wheezing and good diaphragmatic motion Heart:  S1/S2 no murmur, no rub, gallop or click PMI normal Abdomen: benighn, BS positve, no tenderness, no AAA post surgery for colon cancer midline scar  no bruit.  No HSM or HJR Distal pulses intact with no bruits No edema Neuro non-focal Skin warm and dry No muscular weakness  Labs:   Lab Results  Component Value Date   WBC 11.9 (H) 02/21/2020   HGB 7.6 (L) 02/21/2020   HCT 23.7 (L) 02/21/2020   MCV 91.2 02/21/2020   PLT 437 (H) 02/21/2020    Recent Labs  Lab 02/21/20 0336  NA 136  K 4.0  CL 104  CO2 21*  BUN 40*  CREATININE 4.78*  CALCIUM 8.6*  GLUCOSE 115*   Lab Results  Component Value Date   CKTOTAL 151 02/03/2020   No results found for: CHOL No results found for: HDL No results found for: LDLCALC No results found for: TRIG No results found for: CHOLHDL No results found for: LDLDIRECT    Radiology: CT ABDOMEN PELVIS WO CONTRAST  Result Date: 02/03/2020 CLINICAL DATA:  Unintentional weight loss EXAM: CT CHEST, ABDOMEN AND PELVIS WITHOUT CONTRAST TECHNIQUE: Multidetector CT imaging of the chest, abdomen and pelvis was performed following the standard protocol without IV contrast. COMPARISON:  None. FINDINGS: CT CHEST FINDINGS Cardiovascular: Cardiac size within normal limits. No pericardial effusion. The central pulmonary arteries are of normal caliber. Mild atherosclerotic calcification is seen scattered throughout the thoracic aorta. No evidence of aortic aneurysm. Mediastinum/Nodes: Thyroid unremarkable. No pathologic thoracic adenopathy. Esophagus unremarkable. Lungs/Pleura:  Minimal scarring within the lingula. 7 mm ground-glass nodule is seen within the left lower lobe, axial image # 105/7. The lungs are otherwise clear. No pneumothorax or pleural effusion. Central airways are widely patent. Musculoskeletal: Thoracic osseous structures are age-appropriate. CT ABDOMEN PELVIS FINDINGS Hepatobiliary: Liver and gallbladder are unremarkable. No intra or extrahepatic biliary ductal dilation. Pancreas: Unremarkable Spleen: Absent Adrenals/Urinary Tract: The adrenal glands are unremarkable. The left kidney is severely atrophic. The right kidney is normal in size and position. There is moderate right hydronephrosis and hydroureter to the level of the ureterovesicular junction. A distal obstructing mass or calcification is not identified, though imaging is limited by noncontrast technique. A Foley catheter balloon is seen within the bladder lumen. A large amount of gas is seen within the bladder lumen, more than would be typically expected from simple catheterization. This raises the question of a enteric fistula though, again, official is not clearly identified on this limited examination. Stomach/Bowel: Stomach, and small bowel are unremarkable. Sigmoid colectomy has been performed. Residual colon is unremarkable. Appendix normal. Infiltration of the presacral space may relate to prior surgery. No free intraperitoneal gas or fluid. Vascular/Lymphatic: There is mild aortoiliac atherosclerotic calcification without evidence of aneurysm. No pathologic adenopathy seen within the abdomen and pelvis. Reproductive: The prostate gland is moderately enlarged. Seminal vesicles unremarkable. Other: None significant Musculoskeletal: No acute bone abnormality within the abdomen and pelvis. Degenerative changes are seen within the lumbar spine. IMPRESSION: 1. Moderate right hydronephrosis and hydroureter to the level of the ureterovesicular junction. A distal obstructing mass or calcification is not  identified, though imaging is limited by noncontrast technique. 2. A large amount of gas within the bladder lumen, more than would be typically expected from simple catheterization raising the question of an enteric fistula. Correlation with urinalysis and urine culture may be helpful. Cystography may also be helpful once the patient's acute issues have resolved. 3. 7 mm ground-glass nodule within the left lower lobe. Initial follow-up with CT at 6-12 months is recommended to confirm persistence. If persistent, repeat CT is recommended every 2 years until 5 years of stability has been established. This recommendation follows the consensus statement: Guidelines for Management of Incidental Pulmonary Nodules Detected on CT Images: From the Fleischner Society 2017; Radiology  2017; 035:009-381. 4. Aortic atherosclerosis. Aortic Atherosclerosis (ICD10-I70.0). Electronically Signed   By: Fidela Salisbury MD   On: 02/03/2020 02:03   CT CHEST WO CONTRAST  Result Date: 02/03/2020 CLINICAL DATA:  Unintentional weight loss EXAM: CT CHEST, ABDOMEN AND PELVIS WITHOUT CONTRAST TECHNIQUE: Multidetector CT imaging of the chest, abdomen and pelvis was performed following the standard protocol without IV contrast. COMPARISON:  None. FINDINGS: CT CHEST FINDINGS Cardiovascular: Cardiac size within normal limits. No pericardial effusion. The central pulmonary arteries are of normal caliber. Mild atherosclerotic calcification is seen scattered throughout the thoracic aorta. No evidence of aortic aneurysm. Mediastinum/Nodes: Thyroid unremarkable. No pathologic thoracic adenopathy. Esophagus unremarkable. Lungs/Pleura: Minimal scarring within the lingula. 7 mm ground-glass nodule is seen within the left lower lobe, axial image # 105/7. The lungs are otherwise clear. No pneumothorax or pleural effusion. Central airways are widely patent. Musculoskeletal: Thoracic osseous structures are age-appropriate. CT ABDOMEN PELVIS FINDINGS  Hepatobiliary: Liver and gallbladder are unremarkable. No intra or extrahepatic biliary ductal dilation. Pancreas: Unremarkable Spleen: Absent Adrenals/Urinary Tract: The adrenal glands are unremarkable. The left kidney is severely atrophic. The right kidney is normal in size and position. There is moderate right hydronephrosis and hydroureter to the level of the ureterovesicular junction. A distal obstructing mass or calcification is not identified, though imaging is limited by noncontrast technique. A Foley catheter balloon is seen within the bladder lumen. A large amount of gas is seen within the bladder lumen, more than would be typically expected from simple catheterization. This raises the question of a enteric fistula though, again, official is not clearly identified on this limited examination. Stomach/Bowel: Stomach, and small bowel are unremarkable. Sigmoid colectomy has been performed. Residual colon is unremarkable. Appendix normal. Infiltration of the presacral space may relate to prior surgery. No free intraperitoneal gas or fluid. Vascular/Lymphatic: There is mild aortoiliac atherosclerotic calcification without evidence of aneurysm. No pathologic adenopathy seen within the abdomen and pelvis. Reproductive: The prostate gland is moderately enlarged. Seminal vesicles unremarkable. Other: None significant Musculoskeletal: No acute bone abnormality within the abdomen and pelvis. Degenerative changes are seen within the lumbar spine. IMPRESSION: 1. Moderate right hydronephrosis and hydroureter to the level of the ureterovesicular junction. A distal obstructing mass or calcification is not identified, though imaging is limited by noncontrast technique. 2. A large amount of gas within the bladder lumen, more than would be typically expected from simple catheterization raising the question of an enteric fistula. Correlation with urinalysis and urine culture may be helpful. Cystography may also be helpful  once the patient's acute issues have resolved. 3. 7 mm ground-glass nodule within the left lower lobe. Initial follow-up with CT at 6-12 months is recommended to confirm persistence. If persistent, repeat CT is recommended every 2 years until 5 years of stability has been established. This recommendation follows the consensus statement: Guidelines for Management of Incidental Pulmonary Nodules Detected on CT Images: From the Fleischner Society 2017; Radiology 2017; 284:228-243. 4. Aortic atherosclerosis. Aortic Atherosclerosis (ICD10-I70.0). Electronically Signed   By: Fidela Salisbury MD   On: 02/03/2020 02:03   DG Retrograde Pyelogram  Result Date: 02/04/2020 CLINICAL DATA:  Right hydronephrosis EXAM: RETROGRADE PYELOGRAM COMPARISON:  CT from earlier the same day FINDINGS: Single fluoroscopic spot image documents partial opacification of dilated right renal collecting system and ureter. The mid ureter is distended as well. Distal ureter not visualized. IMPRESSION: Right hydronephrosis and hydroureter. Electronically Signed   By: Lucrezia Europe M.D.   On: 02/04/2020 08:19   US RENAL  Result Date: 02/02/2020 CLINICAL DATA:  Initial evaluation for acute renal failure. History of prior left nephrectomy. EXAM: RENAL / URINARY TRACT ULTRASOUND COMPLETE COMPARISON:  None available. FINDINGS: Right Kidney: Renal measurements: 12.5 x 6.7 x 7.7 cm = volume: 336 mL. Increased echogenicity seen within the renal parenchyma. No shadowing echogenic calculi. Moderate hydronephrosis seen involving the right kidney. Dilatation of the visualized proximal right ureter seen as well. 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 No focal renal mass. Left Kidney: Absent.  No abnormality at the left nephrectomy bed. Bladder: Appears normal for degree of bladder distention. Other: None. IMPRESSION: 1. 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 2. Increased  echogenicity within the underlying right renal parenchyma, consistent with medical renal disease. 3. Prior left nephrectomy. Electronically Signed   By: Jeannine Boga M.D.   On: 02/02/2020 21:56   DG CHEST PORT 1 VIEW  Result Date: 02/12/2020 CLINICAL DATA:  Fever EXAM: PORTABLE CHEST 1 VIEW COMPARISON:  None. FINDINGS: The heart size and mediastinal contours are within normal limits. Both lungs are clear. The visualized skeletal structures are unremarkable. IMPRESSION: No active disease. Electronically Signed   By: Fidela Salisbury MD   On: 02/12/2020 20:14   DG Chest Portable 1 View  Result Date: 02/03/2020 CLINICAL DATA:  Tachycardia EXAM: PORTABLE CHEST 1 VIEW COMPARISON:  None. FINDINGS: The heart size and mediastinal contours are within normal limits. Both lungs are clear. The visualized skeletal structures are unremarkable. IMPRESSION: No active disease. Electronically Signed   By: Fidela Salisbury MD   On: 02/03/2020 02:15   ECHOCARDIOGRAM LIMITED  Result Date: 02/03/2020    ECHOCARDIOGRAM LIMITED REPORT   Patient Name:   ROBERTT BUDA Flater Date of Exam: 02/03/2020 Medical Rec #:  [id number]     Height:       67.0 in Accession #:    [id number]    Weight:       180.0 lb Date of Birth:  [birth year]     BSA:          1.934 m Patient Age:    6 years      BP:           162/78 mmHg Patient Gender: M             HR:           94 bpm. Exam Location:  Inpatient Procedure: Limited Echo, Cardiac Doppler and Color Doppler Indications:    Abnormal ECG  History:        Patient has prior history of Echocardiogram examinations, most                 recent 06/22/2004. Risk Factors:Former Smoker and Hypertension.                 CKD.  Sonographer:    Clayton Lefort RDCS (AE) Referring Phys: [id number] Noland Fordyce MATHEWS  Sonographer Comments: No parasternal window, no apical window and no subcostal window. IMPRESSIONS  1. The patient has no acoustic windows for TTE. The sonographer attempted to find cardiac structures for  nearly 15 minutes. There were no parasternal, apical, or subcostal windows. There is nothing that can be said about cardiac stucture or function due  to non-diagnostic images. Recommend alternative means to assess cardiac function if indicated.  2. Left ventricular endocardial border not optimally defined to evaluate regional wall motion. FINDINGS  Left Ventricle: Left ventricular endocardial border not optimally defined to evaluate regional wall  motion. Eleonore Chiquito MD Electronically signed by Eleonore Chiquito MD Signature Date/Time: 02/03/2020/5:59:38 PM    Final     EKG: NSR no acute changes    ASSESSMENT AND PLAN:   1. PAF:  In setting of acute renal failure and sepsis resolved quickly CHADVASC 4 However he is severely anemic with Hb 7.6 no obvious bleeding source on EGD/Colon.  Would not anticoagulate at this time given this and fact that episode was self limited and he has been in NSR for days. Continue oral Cardizem   2. A/CRF:  F/u Dr Hollie Salk no uremic signs urinating High risk for needing dialysis with solitary kidney but hopefully will improve some with stent and relief of hydronephrosis   3. Anemia:  No obvious GI source polyps removed this admission History of colon cancer Hb was 10 in August of this year  Has been placed on Aranesp by renal   Signed: Jenkins Rouge 02/21/2020, 11:58 AM

## 2020-02-21 NOTE — TOC Progression Note (Signed)
Transition of Care [facility]) - Progression Note    Patient Details  Name: Paul Bond MRN: [id number] Date of Birth: November 15, 1940  Transition of Care [facility]) CM/SW [place], [place] Phone Number: [phone] 02/21/2020, 10:46 AM  Clinical Narrative:     CSW reached out to [place] to ascertain if they can accept patient for admission today. COVID test is back. CSW awaiting a return call from facility.  TOC team will continue to assist with discharge planning needs.  Expected Discharge Plan: [place] Barriers to Discharge: Continued Medical Work up, SNF Pending bed offer  Expected Discharge Plan and Services Expected Discharge Plan: [place] In-house Referral: Clinical Social Work     Living arrangements for the past 2 months: Single Family Home                                       Social Determinants of Health (SDOH) Interventions    Readmission Risk Interventions No flowsheet data found.

## 2020-02-21 NOTE — Progress Notes (Addendum)
Pharmacy Antibiotic Note  Paul Bond is a 79 y.o. male on Zosyn for Morganella bacteremia and UTI.  AKI due to obstructive uropathy, R ureteral stent placed 8/25. Solitary kidney. Creatinine has trended down slowly, now ~5. CrCl remains ~98mL/min.   Patient remains afebrile, WBC stable. Plan is to change antibiotics to PO today.   Plan: D/c Zosyn 2.25 gm VI q8h. Initiate ciprofloxacin 500mg  every 24 hours Monitor renal function and clinical progress  Height: 5\' 7"  (170.2 cm) Weight: 67.5 kg (148 lb 13 oz) IBW/kg (Calculated) : 66.1  Temp (24hrs), Avg:98.5 F (36.9 C), Min:98 F (36.7 C), Max:99 F (37.2 C)  Recent Labs  Lab 02/17/20 0435 02/18/20 0458 02/19/20 0246 02/20/20 0142 02/21/20 0336  WBC 11.2* 11.2* 13.9* 11.2* 11.9*  CREATININE 5.30* 5.09* 5.04* 5.11* 4.78*    Estimated Creatinine Clearance: 11.7 mL/min (A) (by C-G formula based on SCr of 4.78 mg/dL (H)).    No Known Allergies  Antimicrobials this admission: Zosyn 9/4 >> 9/12 Cipro 9/12 >>  Dose adjustments this admission:  9/4: adjusted from 3.375 > 2.25 gm IV q8h for renal function  Microbiology results: 9/3 blood: Morganella morgani 9/3 urine: 100k Morganella morganii  8/24 urine: negative 8/24 COVID: negative  Thank you for allowing pharmacy to be a part of this patient's care.  Mercy Riding, PharmD PGY1 Acute Care Pharmacy Resident Please refer to [facility] for unit-specific pharmacist

## 2020-02-21 NOTE — TOC Transition Note (Addendum)
Transition of Care [facility]) - CM/SW Discharge Note   Patient Details  Name: Paul Bond MRN: [id number] Date of Birth: 04-05-1941  Transition of Care [facility]) CM/SW Contact:  Bary Castilla, LCSW Phone Number: [phone] 02/21/2020, 1:59 PM   Clinical Narrative:     Patient will DC to:? [facility] Anticipated DC date:?02/21/20 Family notified:?attempted to call wife Transport QV:[facility]   Per MD patient ready for DC to [facility] RN, patient, patient's family, and CSW notified wife. Discharge Summary sent to facility. RN given number for report   [phone] room 107. DC packet on chart. Ambulance transport requested for patient.   CSW signing off.   [place], [place] [phone]   Final next level of care: Skilled Nursing Facility Barriers to Discharge: No Barriers Identified   Patient Goals and CMS Choice        Discharge Placement              Patient [facility] bed at:  [facility]) Patient to be transferred to facility by: [place] Name of family member notified: attempted to notify wife Patient and family notified of of transfer: 02/21/20  Discharge Plan and Services In-house Referral: Clinical Social Work                                   Social Determinants of Health (SDOH) Interventions     Readmission Risk Interventions No flowsheet data found.

## 2020-02-21 NOTE — Discharge Summary (Signed)
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 hydroureter. Electronically Signed   By: Lucrezia Europe M.D.   On: 02/04/2020 08:19   US RENAL  Result Date: 02/02/2020 CLINICAL DATA:  Initial evaluation for acute renal failure. History of prior left nephrectomy. EXAM: RENAL / URINARY TRACT ULTRASOUND COMPLETE COMPARISON:  None available. FINDINGS: Right Kidney: Renal measurements: 12.5 x 6.7 x 7.7 cm = volume: 336 mL. Increased echogenicity seen within the renal parenchyma. No shadowing echogenic calculi. Moderate hydronephrosis seen involving the right kidney. Dilatation of the visualized proximal right ureter seen as well. 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 No focal renal mass. Left Kidney: Absent.  No  abnormality at the left nephrectomy bed. Bladder: Appears normal for degree of bladder distention. Other: None. IMPRESSION: 1. 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 2. Increased echogenicity within the underlying right renal parenchyma, consistent with medical renal disease. 3. Prior left nephrectomy. Electronically Signed   By: Jeannine Boga M.D.   On: 02/02/2020 21:56   DG CHEST PORT 1 VIEW  Result Date: 02/12/2020 CLINICAL DATA:  Fever EXAM: PORTABLE CHEST 1 VIEW COMPARISON:  None. FINDINGS: The heart size and mediastinal contours are within normal limits. Both lungs are clear. The visualized skeletal structures are unremarkable. IMPRESSION:  No active disease. Electronically Signed   By: Fidela Salisbury MD   On: 02/12/2020 20:14   DG Chest Portable 1 View  Result Date: 02/03/2020 CLINICAL DATA:  Tachycardia EXAM: PORTABLE CHEST 1 VIEW COMPARISON:  None. FINDINGS: The heart size and mediastinal contours are within normal limits. Both lungs are clear. The visualized skeletal structures are unremarkable. IMPRESSION: No active disease. Electronically Signed   By: Fidela Salisbury MD   On: 02/03/2020 02:15   ECHOCARDIOGRAM LIMITED  Result Date: 02/03/2020    ECHOCARDIOGRAM LIMITED REPORT   Patient Name:   BASILIO MEADOW Shelvin Date of Exam: 02/03/2020 Medical Rec #:  [id number]     Height:       67.0 in Accession #:    [id number]    Weight:       180.0 lb Date of Birth:  1940-08-15     BSA:          1.934 m Patient Age:    60 years      BP:           162/78 mmHg Patient Gender: M             HR:           94 bpm. Exam Location:  Inpatient Procedure: Limited Echo, Cardiac Doppler and Color Doppler Indications:    Abnormal ECG  History:        Patient has prior history of Echocardiogram examinations, most                 recent 06/22/2004. Risk Factors:Former Smoker and Hypertension.                 CKD.  Sonographer:    Clayton Lefort RDCS (AE)  Referring Phys: [id number] Noland Fordyce MATHEWS  Sonographer Comments: No parasternal window, no apical window and no subcostal window. IMPRESSIONS  1. The patient has no acoustic windows for TTE. The sonographer attempted to find cardiac structures for nearly 15 minutes. There were no parasternal, apical, or subcostal windows. There is nothing that can be said about cardiac stucture or function due  to non-diagnostic images. Recommend alternative means to assess cardiac function if indicated.  2. Left ventricular endocardial border not optimally defined to evaluate regional wall motion. FINDINGS  Left Ventricle: Left ventricular endocardial border not optimally defined to evaluate regional wall motion. Eleonore Chiquito MD Electronically signed by Eleonore Chiquito MD Signature Date/Time: 02/03/2020/5:59:38 PM    Final     Labs: BNP (last 3 results) No results for input(s): BNP in the last 8760 hours. Basic Metabolic Panel: Recent Labs  Lab 02/17/20 0435 02/18/20 0458 02/19/20 0246 02/20/20 0142 02/21/20 0336  NA 137 140 139 139 136  K 4.3 4.1 4.1 4.1 4.0  CL 104 105 107 105 104  CO2 21* 19* 17* 22 21*  GLUCOSE 121* 93 65* 81 115*  BUN 73* 61* 56* 49* 40*  CREATININE 5.30* 5.09* 5.04* 5.11* 4.78*  CALCIUM 8.3* 8.6* 8.6* 8.9 8.6*  PHOS 4.9* 5.1* 4.7* 3.5 2.9   Liver Function Tests: Recent Labs  Lab 02/17/20 0435 02/18/20 0458 02/19/20 0246 02/20/20 0142 02/21/20 0336  ALBUMIN 2.1* 2.3* 2.1* 2.2* 2.1*   No results for input(s): LIPASE, AMYLASE in the last 168 hours. No results for input(s): AMMONIA in the last 168 hours. CBC: Recent Labs  Lab 02/17/20 0435 02/18/20 0458 02/19/20 0246 02/20/20 0142 02/21/20 0336  WBC 11.2* 11.2* 13.9* 11.2* 11.9*  NEUTROABS 8.4* 8.3* 10.4* 7.8* 7.0  HGB 7.6* 8.5* 7.5* 8.2* 7.6*  HCT 24.6* 26.4* 24.1* 25.6* 23.7*  MCV 93.5 92.3 91.6 91.1 91.2  PLT 274 355 328 400 437*   Cardiac Enzymes: No results for input(s): CKTOTAL, CKMB, CKMBINDEX, TROPONINI  in the last 168 hours. BNP: Invalid input(s): POCBNP CBG: Recent Labs  Lab 02/20/20 1219 02/20/20 1710 02/20/20 2117 02/21/20 0615 02/21/20 1118  GLUCAP 160* 165* 213* 108* 144*   D-Dimer No results for input(s): DDIMER in the last 72 hours. Hgb A1c No results for input(s): HGBA1C in the last 72 hours. Lipid Profile No results for input(s): CHOL, HDL, LDLCALC, TRIG, CHOLHDL, LDLDIRECT in the last 72 hours. Thyroid function studies No results for input(s): TSH, T4TOTAL, T3FREE, THYROIDAB in the last 72 hours.  Invalid input(s): FREET3 Anemia work up No results for input(s): VITAMINB12, FOLATE, FERRITIN, TIBC, IRON, RETICCTPCT in the last 72 hours. Urinalysis    Component Value Date/Time   COLORURINE YELLOW 02/13/2020 1530   APPEARANCEUR CLOUDY (A) 02/13/2020 1530   LABSPEC 1.006 02/13/2020 1530   PHURINE 9.0 (H) 02/13/2020 1530   GLUCOSEU 150 (A) 02/13/2020 1530   HGBUR MODERATE (A) 02/13/2020 1530   BILIRUBINUR NEGATIVE 02/13/2020 1530   KETONESUR NEGATIVE 02/13/2020 1530   PROTEINUR 100 (A) 02/13/2020 1530   UROBILINOGEN 1.0 01/21/2008 2334   NITRITE NEGATIVE 02/13/2020 1530   LEUKOCYTESUR LARGE (A) 02/13/2020 1530   Sepsis Labs Invalid input(s): PROCALCITONIN,  WBC,  LACTICIDVEN Microbiology Recent Results (from the past 240 hour(s))  Culture, Urine     Status: Abnormal   Collection Time: 02/12/20  1:43 AM   Specimen: Urine, Random  Result Value Ref Range Status   Specimen Description URINE, RANDOM  Final   Special Requests   Final    NONE Performed at [facility], [place] [address], [place], [place] [address]    Culture >=100,000 COLONIES/mL [facility] MORGANII (A)  Final   Report Status 02/15/2020 FINAL  Final   Organism ID, Bacteria MORGANELLA MORGANII (A)  Final      Susceptibility   Morganella morganii - MIC*    AMPICILLIN >=32 RESISTANT Resistant     CEFAZOLIN >=64 RESISTANT Resistant     CIPROFLOXACIN 1 SENSITIVE Sensitive     GENTAMICIN <=1  SENSITIVE Sensitive     IMIPENEM 1 SENSITIVE Sensitive     NITROFURANTOIN RESISTANT Resistant     TRIMETH/SULFA <=20 SENSITIVE Sensitive     AMPICILLIN/SULBACTAM >=32 RESISTANT Resistant     PIP/TAZO <=4 SENSITIVE Sensitive     * >=100,000 COLONIES/mL MORGANELLA MORGANII  Culture, blood (routine x 2)     Status: Abnormal   Collection Time: 02/12/20  8:14 PM   Specimen: BLOOD RIGHT HAND  Result Value Ref Range Status   Specimen Description BLOOD RIGHT HAND  Final   Special Requests   Final    BOTTLES DRAWN AEROBIC ONLY Blood Culture results may not be optimal due to an inadequate volume of blood received in culture bottles   Culture  Setup Time   Final    GRAM NEGATIVE RODS AEROBIC BOTTLE ONLY CRITICAL VALUE NOTED.  VALUE IS CONSISTENT WITH PREVIOUSLY REPORTED AND CALLED VALUE.    Culture (A)  Final    MORGANELLA MORGANII SUSCEPTIBILITIES PERFORMED ON PREVIOUS CULTURE WITHIN THE LAST 5 DAYS. Performed at [facility], [place] [address] [place], [place] [address]    Report Status 02/15/2020 FINAL  Final  Culture, blood (routine x 2)     Status: Abnormal   Collection Time:  02/12/20  8:15 PM   Specimen: BLOOD  Result Value Ref Range Status   Specimen Description BLOOD RIGHT ANTECUBITAL  Final   Special Requests   Final    BOTTLES DRAWN AEROBIC ONLY Blood Culture results may not be optimal due to an inadequate volume of blood received in culture bottles   Culture  Setup Time   Final    GRAM NEGATIVE RODS AEROBIC BOTTLE ONLY CRITICAL RESULT CALLED TO, READ BACK BY AND VERIFIED WITH: PHARMD J MILLEN [id number] FCP    Culture MORGANELLA MORGANII (A)  Final   Report Status 02/15/2020 FINAL  Final   Organism ID, Bacteria MORGANELLA MORGANII  Final      Susceptibility   Morganella morganii - MIC*    AMPICILLIN >=32 RESISTANT Resistant     CEFAZOLIN >=64 RESISTANT Resistant     CEFTAZIDIME <=1 SENSITIVE Sensitive     CIPROFLOXACIN 1 SENSITIVE Sensitive     GENTAMICIN <=1  SENSITIVE Sensitive     IMIPENEM 1 SENSITIVE Sensitive     TRIMETH/SULFA <=20 SENSITIVE Sensitive     AMPICILLIN/SULBACTAM >=32 RESISTANT Resistant     PIP/TAZO <=4 SENSITIVE Sensitive     * MORGANELLA MORGANII  Blood Culture ID Panel (Reflexed)     Status: Abnormal   Collection Time: 02/12/20  8:15 PM  Result Value Ref Range Status   Enterococcus faecalis NOT DETECTED NOT DETECTED Final   Enterococcus Faecium NOT DETECTED NOT DETECTED Final   Listeria monocytogenes NOT DETECTED NOT DETECTED Final   Staphylococcus species NOT DETECTED NOT DETECTED Final   Staphylococcus aureus (BCID) NOT DETECTED NOT DETECTED Final   Staphylococcus epidermidis NOT DETECTED NOT DETECTED Final   Staphylococcus lugdunensis NOT DETECTED NOT DETECTED Final   Streptococcus species NOT DETECTED NOT DETECTED Final   Streptococcus agalactiae NOT DETECTED NOT DETECTED Final   Streptococcus pneumoniae NOT DETECTED NOT DETECTED Final   Streptococcus pyogenes NOT DETECTED NOT DETECTED Final   A.calcoaceticus-baumannii NOT DETECTED NOT DETECTED Final   Bacteroides fragilis NOT DETECTED NOT DETECTED Final   Enterobacterales DETECTED (A) NOT DETECTED Final    Comment: Enterobacterales represent a large order of gram negative bacteria, not a single organism. Refer to culture for further identification. RBV PHARMD J MILLEN [phone] [id number] FCP    Enterobacter cloacae complex NOT DETECTED NOT DETECTED Final   Escherichia coli NOT DETECTED NOT DETECTED Final   Klebsiella aerogenes NOT DETECTED NOT DETECTED Final   Klebsiella oxytoca NOT DETECTED NOT DETECTED Final   Klebsiella pneumoniae NOT DETECTED NOT DETECTED Final   Proteus species NOT DETECTED NOT DETECTED Final   Salmonella species NOT DETECTED NOT DETECTED Final   Serratia marcescens NOT DETECTED NOT DETECTED Final   Haemophilus influenzae NOT DETECTED NOT DETECTED Final   Neisseria meningitidis NOT DETECTED NOT DETECTED Final   Pseudomonas aeruginosa NOT  DETECTED NOT DETECTED Final   Stenotrophomonas maltophilia NOT DETECTED NOT DETECTED Final   Candida albicans NOT DETECTED NOT DETECTED Final   Candida auris NOT DETECTED NOT DETECTED Final   Candida glabrata NOT DETECTED NOT DETECTED Final   Candida krusei NOT DETECTED NOT DETECTED Final   Candida parapsilosis NOT DETECTED NOT DETECTED Final   Candida tropicalis NOT DETECTED NOT DETECTED Final   Cryptococcus neoformans/gattii NOT DETECTED NOT DETECTED Final   CTX-M ESBL NOT DETECTED NOT DETECTED Final   Carbapenem resistance IMP NOT DETECTED NOT DETECTED Final   Carbapenem resistance KPC NOT DETECTED NOT DETECTED Final   Carbapenem resistance NDM NOT DETECTED NOT DETECTED Final  Carbapenem resist OXA 48 LIKE NOT DETECTED NOT DETECTED Final   Carbapenem resistance VIM NOT DETECTED NOT DETECTED Final    Comment: Performed at [facility], [place] [address], [place], [place] [address]  Stool culture     Status: None (Preliminary result)   Collection Time: 02/16/20 12:43 PM  Result Value Ref Range Status   Salmonella/Shigella Screen Final report  Final   Campylobacter Culture PENDING  Incomplete   E coli, Shiga toxin Assay Negative Negative Final    Comment: (NOTE) Performed At: [facility] [address] [place], [place] [id number] Rush Farmer MD UU:[id number]   STOOL CULTURE REFLEX - RSASHR     Status: None   Collection Time: 02/16/20 12:43 PM  Result Value Ref Range Status   Stool Culture result 1 (RSASHR) Comment  Final    Comment: (NOTE) No Salmonella or Shigella recovered. Performed At: [facility] [address] [place], [place] [id number] Rush Farmer MD OV:[id number]   SARS Coronavirus 2 by RT PCR (hospital order, performed in [facility] hospital lab) Nasopharyngeal Nasopharyngeal Swab     Status: None   Collection Time: 02/20/20  1:23 PM   Specimen: Nasopharyngeal Swab  Result Value Ref Range Status   SARS Coronavirus 2 NEGATIVE NEGATIVE  Final    Comment: (NOTE) SARS-CoV-2 target nucleic acids are NOT DETECTED.  The SARS-CoV-2 RNA is generally detectable in upper and lower respiratory specimens during the acute phase of infection. The lowest concentration of SARS-CoV-2 viral copies this assay can detect is 250 copies / mL. A negative result does not preclude SARS-CoV-2 infection and should not be used as the sole basis for treatment or other patient management decisions.  A negative result may occur with improper specimen collection / handling, submission of specimen other than nasopharyngeal swab, presence of viral mutation(s) within the areas targeted by this assay, and inadequate number of viral copies (<250 copies / mL). A negative result must be combined with clinical observations, patient history, and epidemiological information.  Fact Sheet for Patients:   StrictlyIdeas.no  Fact Sheet for Healthcare Providers: BankingDealers.co.za  This test is not yet approved or  cleared by the [place] FDA and has been authorized for detection and/or diagnosis of SARS-CoV-2 by FDA under an Emergency Use Authorization (EUA).  This EUA will remain in effect (meaning this test can be used) for the duration of the COVID-19 declaration under Section 564(b)(1) of the Act, 21 U.S.C. section 360bbb-3(b)(1), unless the authorization is terminated or revoked sooner.  Performed at [facility], [place] [address] [place], [place] [address]      Time coordinating discharge: 35 minutes  SIGNED: Antonieta Pert, MD  [facility] 02/21/2020, 1:24 PM  If 7PM-7AM, please contact night-coverage [email or web address]

## 2020-02-22 ENCOUNTER — Telehealth: Payer: Self-pay

## 2020-02-22 LAB — SURGICAL PATHOLOGY

## 2020-02-22 LAB — PATHOLOGIST SMEAR REVIEW

## 2020-02-22 NOTE — Telephone Encounter (Signed)
The appt has already been scheduled with Nevin Bloodgood and notified

## 2020-02-22 NOTE — Telephone Encounter (Signed)
-----   Message from Irving Copas., MD sent at 02/19/2020 10:52 PM EDT ----- Regarding: Follow-up Paul Bond,Please see if we can set up a follow-up for this patient with PA Chester Holstein or myself in 4 weeks. I am happy to perform a flexible sigmoidoscopy in the [facility] in 6 to 8 weeks for follow-up of rectal ulcer.However the patient seems to be [facility] so we will likely need to work on obtaining the community care consult but okay for now to get a clinic appointment and a tentative flexible sigmoidoscopy spot but truly need to make sure that he can come in we need to get the referral from the [place].Thanks.GM

## 2020-02-23 ENCOUNTER — Encounter (HOSPITAL_COMMUNITY): Payer: Self-pay | Admitting: Gastroenterology

## 2020-03-17 ENCOUNTER — Ambulatory Visit: Payer: Medicare Other | Admitting: Nurse Practitioner

## 2021-08-05 ENCOUNTER — Encounter (HOSPITAL_COMMUNITY): Payer: Self-pay

## 2021-08-05 ENCOUNTER — Other Ambulatory Visit: Payer: Self-pay

## 2021-08-05 ENCOUNTER — Inpatient Hospital Stay (HOSPITAL_COMMUNITY): Payer: Medicare Other

## 2021-08-05 ENCOUNTER — Other Ambulatory Visit (HOSPITAL_COMMUNITY): Payer: Medicare Other

## 2021-08-05 ENCOUNTER — Emergency Department (HOSPITAL_COMMUNITY): Payer: Medicare Other

## 2021-08-05 ENCOUNTER — Inpatient Hospital Stay (HOSPITAL_COMMUNITY)
Admission: EM | Admit: 2021-08-05 | Discharge: 2021-08-10 | DRG: 177 | Disposition: A | Discharge status: Home Health | Payer: Medicare Other | Attending: Internal Medicine | Admitting: Internal Medicine

## 2021-08-05 DIAGNOSIS — Z87891 Personal history of nicotine dependence: Secondary | ICD-10-CM | POA: Diagnosis not present

## 2021-08-05 DIAGNOSIS — I639 Cerebral infarction, unspecified: Secondary | ICD-10-CM

## 2021-08-05 DIAGNOSIS — Z905 Acquired absence of kidney: Secondary | ICD-10-CM | POA: Diagnosis not present

## 2021-08-05 DIAGNOSIS — N185 Chronic kidney disease, stage 5: Secondary | ICD-10-CM | POA: Diagnosis present

## 2021-08-05 DIAGNOSIS — Z6823 Body mass index (BMI) 23.0-23.9, adult: Secondary | ICD-10-CM

## 2021-08-05 DIAGNOSIS — N186 End stage renal disease: Secondary | ICD-10-CM | POA: Diagnosis present

## 2021-08-05 DIAGNOSIS — I160 Hypertensive urgency: Secondary | ICD-10-CM | POA: Diagnosis present

## 2021-08-05 DIAGNOSIS — R627 Adult failure to thrive: Secondary | ICD-10-CM

## 2021-08-05 DIAGNOSIS — E1122 Type 2 diabetes mellitus with diabetic chronic kidney disease: Secondary | ICD-10-CM | POA: Diagnosis present

## 2021-08-05 DIAGNOSIS — Z79899 Other long term (current) drug therapy: Secondary | ICD-10-CM

## 2021-08-05 DIAGNOSIS — D631 Anemia in chronic kidney disease: Secondary | ICD-10-CM | POA: Diagnosis present

## 2021-08-05 DIAGNOSIS — E119 Type 2 diabetes mellitus without complications: Secondary | ICD-10-CM

## 2021-08-05 DIAGNOSIS — U071 COVID-19: Secondary | ICD-10-CM | POA: Diagnosis present

## 2021-08-05 DIAGNOSIS — E1165 Type 2 diabetes mellitus with hyperglycemia: Secondary | ICD-10-CM | POA: Diagnosis present

## 2021-08-05 DIAGNOSIS — I10 Essential (primary) hypertension: Secondary | ICD-10-CM | POA: Diagnosis present

## 2021-08-05 DIAGNOSIS — E43 Unspecified severe protein-calorie malnutrition: Secondary | ICD-10-CM | POA: Diagnosis present

## 2021-08-05 DIAGNOSIS — N289 Disorder of kidney and ureter, unspecified: Secondary | ICD-10-CM | POA: Diagnosis present

## 2021-08-05 DIAGNOSIS — Z794 Long term (current) use of insulin: Secondary | ICD-10-CM | POA: Diagnosis not present

## 2021-08-05 DIAGNOSIS — N179 Acute kidney failure, unspecified: Secondary | ICD-10-CM | POA: Diagnosis present

## 2021-08-05 DIAGNOSIS — I120 Hypertensive chronic kidney disease with stage 5 chronic kidney disease or end stage renal disease: Secondary | ICD-10-CM | POA: Diagnosis present

## 2021-08-05 DIAGNOSIS — E1121 Type 2 diabetes mellitus with diabetic nephropathy: Secondary | ICD-10-CM

## 2021-08-05 DIAGNOSIS — Z888 Allergy status to other drugs, medicaments and biological substances status: Secondary | ICD-10-CM | POA: Diagnosis not present

## 2021-08-05 DIAGNOSIS — Z8673 Personal history of transient ischemic attack (TIA), and cerebral infarction without residual deficits: Secondary | ICD-10-CM

## 2021-08-05 LAB — URINALYSIS, ROUTINE W REFLEX MICROSCOPIC
Bilirubin Urine: NEGATIVE
Glucose, UA: NEGATIVE mg/dL
Ketones, ur: 20 mg/dL — AB
Nitrite: NEGATIVE
Protein, ur: >=300 mg/dL — AB
Specific Gravity, Urine: 1.012 (ref 1.005–1.030)
WBC, UA: >50 WBC/hpf — ABNORMAL HIGH (ref 0–5)
pH: 8 (ref 5.0–8.0)

## 2021-08-05 LAB — CBC WITH DIFFERENTIAL/PLATELET
Abs Immature Granulocytes: 0.15 10*3/uL — ABNORMAL HIGH (ref 0.00–0.07)
Basophils Absolute: 0 10*3/uL (ref 0.0–0.1)
Basophils Relative: 1 %
Eosinophils Absolute: 0.1 10*3/uL (ref 0.0–0.5)
Eosinophils Relative: 1 %
HCT: 35.8 % — ABNORMAL LOW (ref 39.0–52.0)
Hemoglobin: 11.3 g/dL — ABNORMAL LOW (ref 13.0–17.0)
Immature Granulocytes: 2 %
Lymphocytes Relative: 24 %
Lymphs Abs: 1.8 10*3/uL (ref 0.7–4.0)
MCH: 29 pg (ref 26.0–34.0)
MCHC: 31.6 g/dL (ref 30.0–36.0)
MCV: 91.8 fL (ref 80.0–100.0)
Monocytes Absolute: 1.2 10*3/uL — ABNORMAL HIGH (ref 0.1–1.0)
Monocytes Relative: 16 %
Neutro Abs: 4.3 10*3/uL (ref 1.7–7.7)
Neutrophils Relative %: 56 %
Platelets: 220 10*3/uL (ref 150–400)
RBC: 3.9 MIL/uL — ABNORMAL LOW (ref 4.22–5.81)
RDW: 14.3 % (ref 11.5–15.5)
WBC: 7.5 10*3/uL (ref 4.0–10.5)
nRBC: 0 % (ref 0.0–0.2)

## 2021-08-05 LAB — LACTATE DEHYDROGENASE: LDH: 183 U/L (ref 98–192)

## 2021-08-05 LAB — GLUCOSE, CAPILLARY
Glucose-Capillary: 68 mg/dL — ABNORMAL LOW (ref 70–99)
Glucose-Capillary: 86 mg/dL (ref 70–99)
Glucose-Capillary: 132 mg/dL — ABNORMAL HIGH (ref 70–99)

## 2021-08-05 LAB — FERRITIN: Ferritin: 975 ng/mL — ABNORMAL HIGH (ref 24–336)

## 2021-08-05 LAB — BASIC METABOLIC PANEL
Anion gap: 17 — ABNORMAL HIGH (ref 5–15)
BUN: 61 mg/dL — ABNORMAL HIGH (ref 8–23)
CO2: 18 mmol/L — ABNORMAL LOW (ref 22–32)
Calcium: 8.3 mg/dL — ABNORMAL LOW (ref 8.9–10.3)
Chloride: 108 mmol/L (ref 98–111)
Creatinine, Ser: 7.04 mg/dL — ABNORMAL HIGH (ref 0.61–1.24)
GFR, Estimated: 7 mL/min — ABNORMAL LOW (ref >60)
Glucose, Bld: 72 mg/dL (ref 70–99)
Potassium: 4.2 mmol/L (ref 3.5–5.1)
Sodium: 143 mmol/L (ref 135–145)

## 2021-08-05 LAB — RESP PANEL BY RT-PCR (FLU A&B, COVID) ARPGX2
Influenza A by PCR: NEGATIVE
Influenza B by PCR: NEGATIVE
SARS Coronavirus 2 by RT PCR: POSITIVE — AB

## 2021-08-05 LAB — PROCALCITONIN: Procalcitonin: 0.24 ng/mL

## 2021-08-05 LAB — D-DIMER, QUANTITATIVE: D-Dimer, Quant: 0.95 ug/mL-FEU — ABNORMAL HIGH (ref 0.00–0.50)

## 2021-08-05 LAB — FIBRINOGEN: Fibrinogen: 561 mg/dL — ABNORMAL HIGH (ref 210–475)

## 2021-08-05 LAB — C-REACTIVE PROTEIN: CRP: 4 mg/dL — ABNORMAL HIGH (ref <1.0)

## 2021-08-05 LAB — BRAIN NATRIURETIC PEPTIDE: B Natriuretic Peptide: 53.1 pg/mL (ref 0.0–100.0)

## 2021-08-05 MED ORDER — LACTATED RINGERS IV SOLN: INTRAVENOUS | Status: DC

## 2021-08-05 MED ORDER — ASCORBIC ACID 500 MG PO TABS
500.0000 mg | ORAL_TABLET | Freq: Every day | ORAL | Status: DC
  Administered 2021-08-05 – 2021-08-10 (×6): 500 mg via ORAL
  Filled 2021-08-05 (×6): qty 1

## 2021-08-05 MED ORDER — CAMPHOR-MENTHOL 0.5-0.5 % EX LOTN
1.0000 "application " | TOPICAL_LOTION | Freq: Three times a day (TID) | CUTANEOUS | Status: DC | PRN
  Filled 2021-08-05: qty 222

## 2021-08-05 MED ORDER — SODIUM CHLORIDE 0.9 % IV SOLN: 250.0000 mL | INTRAVENOUS | Status: DC | PRN

## 2021-08-05 MED ORDER — ONDANSETRON HCL 4 MG/2ML IJ SOLN: 4.0000 mg | Freq: Four times a day (QID) | INTRAMUSCULAR | Status: DC | PRN

## 2021-08-05 MED ORDER — SEVELAMER CARBONATE 800 MG PO TABS
800.0000 mg | ORAL_TABLET | Freq: Three times a day (TID) | ORAL | Status: DC
  Administered 2021-08-06 – 2021-08-10 (×13): 800 mg via ORAL
  Filled 2021-08-05 (×12): qty 1

## 2021-08-05 MED ORDER — DOCUSATE SODIUM 283 MG RE ENEM
1.0000 | ENEMA | RECTAL | Status: DC | PRN
  Filled 2021-08-05: qty 1

## 2021-08-05 MED ORDER — NEPRO/CARBSTEADY PO LIQD: 237.0000 mL | Freq: Three times a day (TID) | ORAL | Status: DC | PRN

## 2021-08-05 MED ORDER — GUAIFENESIN-DM 100-10 MG/5ML PO SYRP: 10.0000 mL | ORAL_SOLUTION | ORAL | Status: DC | PRN

## 2021-08-05 MED ORDER — CLONIDINE HCL 0.1 MG PO TABS
0.1000 mg | ORAL_TABLET | Freq: Once | ORAL | Status: AC
  Administered 2021-08-05: 0.1 mg via ORAL
  Filled 2021-08-05: qty 1

## 2021-08-05 MED ORDER — SODIUM CHLORIDE 0.9 % IV BOLUS
1000.0000 mL | Freq: Once | INTRAVENOUS | Status: AC
Start: 2021-08-05 — End: 2021-08-05
  Administered 2021-08-05: 1000 mL via INTRAVENOUS

## 2021-08-05 MED ORDER — ZINC SULFATE 220 (50 ZN) MG PO CAPS
220.0000 mg | ORAL_CAPSULE | Freq: Every day | ORAL | Status: DC
  Administered 2021-08-05 – 2021-08-10 (×6): 220 mg via ORAL
  Filled 2021-08-05 (×6): qty 1

## 2021-08-05 MED ORDER — SODIUM CHLORIDE 0.9% FLUSH
3.0000 mL | Freq: Two times a day (BID) | INTRAVENOUS | Status: DC
  Administered 2021-08-06 – 2021-08-10 (×6): 3 mL via INTRAVENOUS

## 2021-08-05 MED ORDER — INSULIN ASPART 100 UNIT/ML IJ SOLN
0.0000 [IU] | Freq: Three times a day (TID) | INTRAMUSCULAR | Status: DC
  Administered 2021-08-06 (×2): 1 [IU] via SUBCUTANEOUS
  Administered 2021-08-07: 5 [IU] via SUBCUTANEOUS
  Administered 2021-08-07: 1 [IU] via SUBCUTANEOUS
  Administered 2021-08-07: 2 [IU] via SUBCUTANEOUS
  Administered 2021-08-08 (×3): 7 [IU] via SUBCUTANEOUS
  Administered 2021-08-09 (×3): 5 [IU] via SUBCUTANEOUS
  Administered 2021-08-10 (×2): 1 [IU] via SUBCUTANEOUS

## 2021-08-05 MED ORDER — DEXTROSE 5 % IV SOLN
INTRAVENOUS | Status: DC
Start: 2021-08-05 — End: 2021-08-08
  Filled 2021-08-05 (×4): qty 1000

## 2021-08-05 MED ORDER — TAMSULOSIN HCL 0.4 MG PO CAPS: 0.4000 mg | ORAL_CAPSULE | Freq: Every day | ORAL | Status: DC

## 2021-08-05 MED ORDER — ZOLPIDEM TARTRATE 5 MG PO TABS: 5.0000 mg | ORAL_TABLET | Freq: Every evening | ORAL | Status: DC | PRN

## 2021-08-05 MED ORDER — HYDRALAZINE HCL 20 MG/ML IJ SOLN
5.0000 mg | INTRAMUSCULAR | Status: DC | PRN
  Administered 2021-08-05: 5 mg via INTRAVENOUS
  Filled 2021-08-05: qty 1

## 2021-08-05 MED ORDER — OXYCODONE HCL 5 MG PO TABS: 5.0000 mg | ORAL_TABLET | ORAL | Status: DC | PRN

## 2021-08-05 MED ORDER — SODIUM BICARBONATE 650 MG PO TABS
650.0000 mg | ORAL_TABLET | Freq: Two times a day (BID) | ORAL | Status: DC
  Administered 2021-08-06 – 2021-08-10 (×9): 650 mg via ORAL
  Filled 2021-08-05 (×9): qty 1

## 2021-08-05 MED ORDER — CALCIUM CARBONATE ANTACID 1250 MG/5ML PO SUSP
500.0000 mg | Freq: Four times a day (QID) | ORAL | Status: DC | PRN
  Filled 2021-08-05: qty 5

## 2021-08-05 MED ORDER — DILTIAZEM HCL ER COATED BEADS 180 MG PO CP24
180.0000 mg | ORAL_CAPSULE | Freq: Every day | ORAL | Status: DC
  Administered 2021-08-06 – 2021-08-10 (×5): 180 mg via ORAL
  Filled 2021-08-05 (×5): qty 1

## 2021-08-05 MED ORDER — SORBITOL 70 % SOLN: 30.0000 mL | Status: DC | PRN

## 2021-08-05 MED ORDER — HEPARIN SODIUM (PORCINE) 5000 UNIT/ML IJ SOLN
5000.0000 [IU] | Freq: Three times a day (TID) | INTRAMUSCULAR | Status: DC
  Administered 2021-08-05 – 2021-08-10 (×14): 5000 [IU] via SUBCUTANEOUS
  Filled 2021-08-05 (×14): qty 1

## 2021-08-05 MED ORDER — HYDROXYZINE HCL 25 MG PO TABS: 25.0000 mg | ORAL_TABLET | Freq: Three times a day (TID) | ORAL | Status: DC | PRN

## 2021-08-05 MED ORDER — ALBUTEROL SULFATE HFA 108 (90 BASE) MCG/ACT IN AERS
2.0000 | INHALATION_SPRAY | RESPIRATORY_TRACT | Status: DC | PRN
  Filled 2021-08-05: qty 6.7

## 2021-08-05 MED ORDER — ACETAMINOPHEN 650 MG RE SUPP: 650.0000 mg | Freq: Four times a day (QID) | RECTAL | Status: DC | PRN

## 2021-08-05 MED ORDER — SODIUM CHLORIDE 0.9% FLUSH
3.0000 mL | Freq: Two times a day (BID) | INTRAVENOUS | Status: DC
  Administered 2021-08-05 – 2021-08-10 (×5): 3 mL via INTRAVENOUS

## 2021-08-05 MED ORDER — ONDANSETRON HCL 4 MG PO TABS
4.0000 mg | ORAL_TABLET | Freq: Four times a day (QID) | ORAL | Status: DC | PRN
Start: 2021-08-05 — End: 2021-08-10

## 2021-08-05 MED ORDER — ACETAMINOPHEN 325 MG PO TABS: 650.0000 mg | ORAL_TABLET | Freq: Four times a day (QID) | ORAL | Status: DC | PRN

## 2021-08-05 MED ORDER — SODIUM CHLORIDE 0.9 % IV BOLUS
2000.0000 mL | Freq: Once | INTRAVENOUS | Status: AC
  Administered 2021-08-06: 2000 mL via INTRAVENOUS

## 2021-08-05 MED ORDER — SODIUM CHLORIDE 0.9% FLUSH: 3.0000 mL | INTRAVENOUS | Status: DC | PRN

## 2021-08-05 MED ORDER — HYDRALAZINE HCL 10 MG PO TABS
10.0000 mg | ORAL_TABLET | Freq: Four times a day (QID) | ORAL | Status: DC | PRN
  Administered 2021-08-05: 10 mg via ORAL
  Filled 2021-08-05: qty 1

## 2021-08-05 NOTE — Assessment & Plan Note (Signed)
-  He tested positive for COVID on 2/21 after 1 day of fatigue, anorexia -Renal US is pending and may provide additional information regarding his symptoms -Anorexia noted without the presence of other GI symptoms -He does not have a current O2 requirement -COVID POSITIVE -The patient has comorbidities which may increase the risk for ARDS/MODS including: age, HTN, DM, CKD -Will admit for further evaluation, close monitoring, and treatment -Monitor on telemetry x at least 24 hours -At this time, will attempt to avoid use of aerosolized medications and use HFAs instead -Will check daily labs including BMP with Mag, Phos; LFTs; CBC with differential; CRP; ferritin; fibrinogen; D-dimer -PT/OT consults -Encourage mobilization/ambulation as much as possible -Patient was seen wearing full PPE including: gown, gloves, N95, and face shield; donning and doffing was in compliance with current standards.

## 2021-08-05 NOTE — H&P (Signed)
History and Physical    Patient: Paul Bond ZMO:[id number] DOB: 1940-10-23 DOA: 08/05/2021 DOS: the patient was seen and examined on 08/05/2021 [facility]: [facility]  Patient coming from: Home - lives with wife; NOK: Wife, [name], [phone]   Chief Complaint: Worsening COVID infection  HPI: Paul Bond is a 81 y.o. male with medical history significant of DM; HTN; CVA; and nephrolithiasis s/p stent placement multiple times presenting with worsening COVID infection.  He was COVID + on 08/01/21.  He reports that he started feeling weak with [place] appetite on Monday, 2/20.  He denies URI symptoms, cough, SOB.  He has no known sick contacts.  No fever.  This is generally how he felt previously with kidney stones.  He denies urinary symptoms.  I spoke with his wife.  She agrees with the history.  They have not talked about dialysis in the past.      ER Course:  FTT in the setting of COVID.  Infection symptoms x 6 days.  No appetite, can't get up, not eating/drinking.  Creatinine 4-5 prior, now 7.  Has indwelling foley, making urine.  Has h/o renal obstruction, renal US pending.  BP 190s.       Review of Systems: As mentioned in the history of present illness. All other systems reviewed and are negative. Past Medical History:  Diagnosis Date   Cancer ([place])    Diabetes mellitus without complication ([place])    Hypertension    Stroke [facility])    Past Surgical History:  Procedure Laterality Date   BIOPSY  02/18/2020   Procedure: BIOPSY;  Surgeon: [name], MD;  Location: [facility] ENDOSCOPY;  Service: Gastroenterology;;   COLON SURGERY     COLONOSCOPY  02/18/2020   COLONOSCOPY WITH PROPOFOL N/A 02/18/2020   Procedure: COLONOSCOPY WITH PROPOFOL;  Surgeon: [name], MD;  Location: [place];  Service: Gastroenterology;  Laterality: N/A;   CYSTOSCOPY W/ URETERAL STENT PLACEMENT Right 02/03/2020   Procedure: CYSTOSCOPY WITH RETROGRADE PYELOGRAM/URETERAL DOUBLE  J STENT PLACEMENT;  Surgeon: [name], MD;  Location: [place];  Service: Urology;  Laterality: Right;   ESOPHAGOGASTRODUODENOSCOPY (EGD) WITH PROPOFOL N/A 02/18/2020   Procedure: ESOPHAGOGASTRODUODENOSCOPY (EGD) WITH PROPOFOL;  Surgeon: [name], MD;  Location: [place];  Service: Gastroenterology;  Laterality: N/A;   POLYPECTOMY  02/18/2020   Procedure: POLYPECTOMY;  Surgeon: [name], MD;  Location[facility] ENDOSCOPY;  Service: Gastroenterology;;   Social History:  reports that he has quit smoking. He has never used smokeless tobacco. He reports that he does not drink alcohol and does not use drugs.  Allergies  Allergen Reactions   Lisinopril Cough   Semaglutide Other (See Comments)    indigestion    No family history on file.  Prior to Admission medications   Medication Sig Start Date End Date Taking? Authorizing Provider  diltiazem (CARDIZEM CD) 180 MG 24 hr capsule Take 1 capsule (180 mg total) by mouth daily. 02/21/20   Antonieta Pert, MD  insulin glargine (LANTUS) 100 UNIT/ML injection Inject 0.05 mLs (5 Units total) into the skin daily. 02/22/20   Antonieta Pert, MD  Multiple Vitamin (MULTIVITAMIN WITH MINERALS) TABS tablet Take 1 tablet by mouth daily. 02/22/20   Antonieta Pert, MD  sevelamer carbonate (RENVELA) 800 MG tablet Take 1 tablet (800 mg total) by mouth 3 (three) times daily with meals. 02/21/20   Antonieta Pert, MD  sodium bicarbonate 650 MG tablet Take 1 tablet (650 mg total) by mouth 2 (two)  times daily. 02/21/20   Antonieta Pert, MD  sucralfate (CARAFATE) 1 GM/10ML suspension Place 20 mLs (2 g total) rectally 2 (two) times daily. 02/21/20 04/21/20  Antonieta Pert, MD  tamsulosin (FLOMAX) 0.4 MG CAPS capsule Take 1 capsule (0.4 mg total) by mouth daily. 02/22/20   Antonieta Pert, MD    Physical Exam: Vitals:   08/05/21 1155 08/05/21 1325 08/05/21 1545 08/05/21 1659  BP: (!) 193/78 (!) 189/79 (!) 186/81 (!) 191/78  Pulse: 78 73 64 71  Resp: 16 15 12 17   Temp: (!) 97.5  F (36.4 C)   98.5 F (36.9 C)  TempSrc:    Oral  SpO2: 100% 98% 99% 100%   General:  Appears calm and comfortable and is in NAD, lethargic and frail Eyes:   EOMI, normal lids, iris ENT:  grossly normal hearing, lips & tongue, mmm; poor/absent dentition Neck:  no LAD, masses or thyromegaly Cardiovascular:  RRR, no m/r/g. No LE edema.  Respiratory:   CTA bilaterally with no wheezes/rales/rhonchi.  Normal respiratory effort. Abdomen:  soft, NT, ND Back:   normal alignment, no CVAT Skin:  no rash or induration seen on limited exam Musculoskeletal:  grossly normal tone BUE/BLE, good ROM, no bony abnormality Psychiatric:  blunted mood and affect, speech fluent and appropriate, AOx3 Neurologic:  CN 2-12 grossly intact, moves all extremities in coordinated fashion   Radiological Exams on Admission: Independently reviewed - see discussion in A/P where applicable  DG Chest 2 View  Result Date: 08/05/2021 CLINICAL DATA:  Covid.  Shortness of breath. EXAM: CHEST - 2 VIEW COMPARISON:  02/12/2020 FINDINGS: Heart size is normal accounting for position. The lungs are free of focal consolidations and pleural effusions. No pulmonary edema. Moderate midthoracic spondylosis. IMPRESSION: No active cardiopulmonary disease. Electronically Signed   By: Nolon Nations M.D.   On: 08/05/2021 13:20    EKG: Independently reviewed.  NSR with rate 78; nonspecific ST changes with NSCSLT   Labs on Admission: I have personally reviewed the available labs and imaging studies at the time of the admission.  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 -Will admit to telemetry -Will provide gentle IVF hydration -STAT renal US is pending; may need urology consultation -No current evidence of urinary infection  -Uncontrolled HTN may be related to recurrent obstructive uropathy -If he does not improve, he may need inpatient HD; otherwise he almost certainly needs outpatient evaluation for consideration of HD in the (near?) future -Will consult nephrology  COVID-19 virus infection- (present on admission) -He tested positive for COVID on 2/21 after 1 day of fatigue, anorexia -Renal US is pending and may provide additional information regarding his symptoms -Anorexia noted without the presence of other GI symptoms -He does not have a current O2 requirement -COVID POSITIVE -The patient has comorbidities which may increase the risk for ARDS/MODS including: age, HTN, DM, CKD -Will admit for further evaluation, close monitoring, and treatment -Monitor on telemetry x at least 24 hours -At this time, will attempt to avoid use of aerosolized medications and use HFAs instead -Will check daily labs including BMP with Mag, Phos; LFTs; CBC with differential; CRP; ferritin;  fibrinogen; D-dimer -PT/OT consults -Encourage mobilization/ambulation as much as possible -Patient was seen wearing full PPE including: gown, gloves, N95, and face shield; donning and doffing was in compliance with current standards.  Hypertension- (present on admission) -Poorly controlled in the ER -Continue home Cardizem -Will add prn IV hydralazine  Diabetes mellitus without complication (HCC) -Prior A1c was 7.9 -Continue Lantus -Cover with sensitive-scale SSI     Advance Care Planning:   Code Status: Full Code   Consults: Nephrology; PT/OT  DVT Prophylaxis: Heparin  Family Communication: I spoke with his wife by telephone at the time of admission  Severity of Illness: The appropriate patient status for this patient is INPATIENT. Inpatient status is judged  to be reasonable and necessary in order to provide the required intensity of service to ensure the patient's safety. The patient's presenting symptoms, physical exam findings, and initial radiographic and laboratory data in the context of their chronic comorbidities is felt to place them at high risk for further clinical deterioration. Furthermore, it is not anticipated that the patient will be medically stable for discharge from the hospital within 2 midnights of admission.   * I certify that at the point of admission it is my clinical judgment that the patient will require inpatient hospital care spanning beyond 2 midnights from the point of admission due to high intensity of service, high risk for further deterioration and high frequency of surveillance required.*  Author: [name], MD 08/05/2021 5:27 PM  For on call review [email or web address].

## 2021-08-05 NOTE — Plan of Care (Signed)
°  Problem: Education: Goal: Knowledge of General Education information will improve Description: Including pain rating scale, medication(s)/side effects and non-pharmacologic comfort measures Outcome: Progressing   Problem: Health Behavior/Discharge Planning: Goal: Ability to manage health-related needs will improve Outcome: Progressing   Problem: Clinical Measurements: Goal: Ability to maintain clinical measurements within normal limits will improve Outcome: Progressing Goal: Will remain free from infection Outcome: Progressing Goal: Diagnostic test results will improve Outcome: Progressing Goal: Cardiovascular complication will be avoided Outcome: Progressing   Problem: Activity: Goal: Risk for activity intolerance will decrease Outcome: Progressing   Problem: Nutrition: Goal: Adequate nutrition will be maintained Outcome: Progressing   Problem: Coping: Goal: Level of anxiety will decrease Outcome: Progressing   Problem: Elimination: Goal: Will not experience complications related to bowel motility Outcome: Progressing   Problem: Safety: Goal: Ability to remain free from injury will improve Outcome: Progressing   Problem: Skin Integrity: Goal: Risk for impaired skin integrity will decrease Outcome: Progressing

## 2021-08-05 NOTE — Assessment & Plan Note (Addendum)
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

## 2021-08-05 NOTE — ED Provider Notes (Signed)
[facility] EMERGENCY DEPARTMENT Provider Note   CSN: [id number] Arrival date & time: 08/05/21  1148     History  Chief Complaint  Patient presents with   Covid Positive   generalized weakness    Paul Bond is a 81 y.o. male.  The history is provided by the patient and medical records. No language interpreter was used.   81 year old male significant history of anemia, diabetes, hypertension brought here via EMS from home with concerns of fatigue.  Patient report for the past 6 days he has been feeling fatigue, decreased appetite, and Some mild shortness of breath.  He also endorsed some congestion and sneezing coughing as well.  5 days ago [facility] went to [facility] and had a positive [facility] test.  His symptom has been persistent since.  He normally is active and able to perform daily activities but now he just does not have much of an energy.  He does not complain of any fever, chills, body aches, headache, chest pain, abdominal pain, nausea vomiting or diarrhea or dysuria.  Home Medications Prior to Admission medications   Medication Sig Start Date End Date Taking? Authorizing Provider  diltiazem (CARDIZEM CD) 180 MG 24 hr capsule Take 1 capsule (180 mg total) by mouth daily. 02/21/20   Antonieta Pert, MD  insulin glargine (LANTUS) 100 UNIT/ML injection Inject 0.05 mLs (5 Units total) into the skin daily. 02/22/20   Antonieta Pert, MD  Multiple Vitamin (MULTIVITAMIN WITH MINERALS) TABS tablet Take 1 tablet by mouth daily. 02/22/20   Antonieta Pert, MD  sevelamer carbonate (RENVELA) 800 MG tablet Take 1 tablet (800 mg total) by mouth 3 (three) times daily with meals. 02/21/20   Antonieta Pert, MD  sodium bicarbonate 650 MG tablet Take 1 tablet (650 mg total) by mouth 2 (two) times daily. 02/21/20   Antonieta Pert, MD  sucralfate (CARAFATE) 1 GM/10ML suspension Place 20 mLs (2 g total) rectally 2 (two) times daily. 02/21/20 04/21/20  Antonieta Pert, MD  tamsulosin (FLOMAX) 0.4 MG CAPS capsule Take 1 capsule  (0.4 mg total) by mouth daily. 02/22/20   Antonieta Pert, MD      Allergies    Patient has no known allergies.    Review of Systems   Review of Systems  All other systems reviewed and are negative.  Physical Exam Updated Vital Signs BP (!) 193/78    Pulse 78    Temp (!) 97.5 F (36.4 C)    Resp 16    SpO2 100%  Physical Exam Vitals and nursing note reviewed.  Constitutional:      General: He is not in acute distress.    Appearance: He is well-developed.  HENT:     Head: Atraumatic.  Eyes:     Conjunctiva/sclera: Conjunctivae normal.  Cardiovascular:     Rate and Rhythm: Normal rate and regular rhythm.     Pulses: Normal pulses.     Heart sounds: Normal heart sounds.  Pulmonary:     Effort: Pulmonary effort is normal.     Breath sounds: Normal breath sounds. No wheezing, rhonchi or rales.  Abdominal:     Palpations: Abdomen is soft.     Tenderness: There is no abdominal tenderness.  Genitourinary:    Comments: Has indwelling foley Musculoskeletal:     Cervical back: Neck supple.     Comments: 5 out of 5 strength all 4 extremities  Skin:    Findings: No rash.  Neurological:     Mental Status: He  is alert. Mental status is at baseline.    ED Results / Procedures / Treatments   Labs (all labs ordered are listed, but only abnormal results are displayed) Labs Reviewed  BASIC METABOLIC PANEL - Abnormal; Notable for the following components:      Result Value   CO2 18 (*)    BUN 61 (*)    Creatinine, Ser 7.04 (*)    Calcium 8.3 (*)    GFR, Estimated 7 (*)    Anion gap 17 (*)    All other components within normal limits  CBC WITH DIFFERENTIAL/PLATELET - Abnormal; Notable for the following components:   RBC 3.90 (*)    Hemoglobin 11.3 (*)    HCT 35.8 (*)    Monocytes Absolute 1.2 (*)    Abs Immature Granulocytes 0.15 (*)    All other components within normal limits  RESP PANEL BY RT-PCR (FLU A&B, COVID) ARPGX2  BRAIN NATRIURETIC PEPTIDE  URINALYSIS, ROUTINE W REFLEX  MICROSCOPIC    EKG EKG Interpretation  Date/Time:  Saturday August 05 2021 11:54:07 EST Ventricular Rate:  78 PR Interval:  149 QRS Duration: 117 QT Interval:  406 QTC Calculation: 463 R Axis:   -82 Text Interpretation: Sinus rhythm Left anterior fascicular block ST elevation, consider inferior injury No acute changes No significant change since last tracing Confirmed by Varney Biles ([id number]) on 08/05/2021 1:57:21 PM  Radiology DG Chest 2 View  Result Date: 08/05/2021 CLINICAL DATA:  Covid.  Shortness of breath. EXAM: CHEST - 2 VIEW COMPARISON:  02/12/2020 FINDINGS: Heart size is normal accounting for position. The lungs are free of focal consolidations and pleural effusions. No pulmonary edema. Moderate midthoracic spondylosis. IMPRESSION: No active cardiopulmonary disease. Electronically Signed   By: Nolon Nations M.D.   On: 08/05/2021 13:20    Procedures Procedures    Medications Ordered in ED Medications  hydrALAZINE (APRESOLINE) tablet 10 mg (has no administration in time range)  sodium chloride 0.9 % bolus 1,000 mL (1,000 mLs Intravenous New Bag/Given 08/05/21 1327)  cloNIDine (CATAPRES) tablet 0.1 mg (0.1 mg Oral Given 08/05/21 1326)    ED Course/ Medical Decision Making/ A&P                           Medical Decision Making  BP (!) 193/78    Pulse 78    Temp (!) 97.5 F (36.4 C)    Resp 16    SpO2 100%   70:75 PM 81 year old male who has had COVID symptoms for the past 6 days, had a positive [facility] test [facility] days ago who is here with generalized fatigue and weakness.  He does not endorse any significant shortness of breath or fever.  No nausea vomiting or diarrhea.  He is currently resting comfortably in no acute discomfort.  Lungs clear on auscultation, abdomen is soft nontender.  Vital signs remarkable for elevated blood pressure of 193/78.  Patient without symptoms suggestive of hypertensive emergency.  We will give 0.1 mg of clonidine for blood pressure  control.  We will also give IV fluid.  Work-up initiated.  1:54 PM Review prior notes from previous hospitalization on 10/21/2019, patient at that time was admitted to the hospital for failure to thrive and has severe AKI with obstructive uropathy.  Labs obtained today was independently reviewed interpreted by me.  Signs of worsening kidney function with BUN 61, creatinine 7.04.  His creatinine was 4.78 approximately 1 year ago.  I  did try to review [facility] and it appears patient has had end-stage renal disease.  He is still making urine.  Unsure his current baseline creatinine  In the setting of failure to thrive, worsening renal function, and elevated blood pressure, will consult for admission. Care discussed with Dr. Kathrynn Humble  2:31 PM Appreciate consultation from [facility] hospitalist, Dr. Lorin Mercy, who agrees to admit patient.  She felt that this could be related to an obstructive stone as patient has had recent hospitalization for same.  Renal ultrasound is currently pending.  Ayrton Mcvay Milillo was evaluated in Emergency Department on 08/05/2021 for the symptoms described in the history of present illness. He was evaluated in the context of the global COVID-19 pandemic, which necessitated consideration that the patient might be at risk for infection with the SARS-CoV-2 virus that causes COVID-19. Institutional protocols and algorithms that pertain to the evaluation of patients at risk for COVID-19 are in a state of rapid change based on information released by regulatory bodies including the CDC and federal and state organizations. These policies and algorithms were followed during the patient's care in the ED.   This patient presents to the ED for concern of weakness, this involves an extensive number of treatment options, and is a complaint that carries with it a high risk of complications and morbidity.  The differential diagnosis includes acute resp failure 2/2 covid pneumonia,  electrolytes abnormalities, renal failure, hypertensive urgency  Co morbidities that complicate the patient evaluation age  HTN  ESRD Additional history obtained:  Additional history obtained from daughter at bedside External records from outside source obtained and reviewed including [facility] notes, and prior hospital admission  notes  Lab Tests:  I Ordered, and personally interpreted labs.  The pertinent results include:  worsening renal function  Imaging Studies ordered:  I ordered imaging studies including renal US I independently visualized and interpreted imaging which showed currently pending result   Cardiac Monitoring:  The patient was maintained on a cardiac monitor.  I personally viewed and interpreted the cardiac monitored which showed an underlying rhythm of: NSR  Medicines ordered and prescription drug management:  I ordered medication including IVF  for dehydration Reevaluation of the patient after these medicines showed that the patient improved I have reviewed the patients home medicines and have made adjustments as needed  Test Considered: chest CTA to r/o PE but pt is not hypoxic  Critical Interventions: IVF  BP medication  Consultations Obtained:  I requested consultation with the [facility] Hospitalist Dr. Lorin Mercy,  and discussed lab and imaging findings as well as pertinent plan - they recommend: admission  Problem List / ED Course: failure to thrive  Covid infection  Worsening renal function  Reevaluation:  After the interventions noted above, I reevaluated the patient and found that they have :improved  Social Determinants of Health: age  Dispostion:  After consideration of the diagnostic results and the patients response to treatment, I feel that the patent would benefit from admission.          Final Clinical Impression(s) / ED Diagnoses Final diagnoses:  Renal dysfunction  COVID-19 virus infection  Hypertensive urgency  Failure to  thrive in adult    Rx / DC Orders ED Discharge Orders     None         Domenic Moras, PA-C 08/05/21 Wilson, Ankit, MD 08/06/21 [phone]
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
## 2021-08-05 NOTE — ED Triage Notes (Addendum)
Pt arrives via EMS from home. EMS reports patient was recently diagnosed with covid on Tuesday with a home test. Pt c/o generalized weakness, fatigue, and sob. AxOx4. Family wanted him seen in the ED to have blood work completed.

## 2021-08-05 NOTE — Assessment & Plan Note (Signed)
-  Prior A1c was 7.9 -Continue Lantus -Cover with sensitive-scale SSI

## 2021-08-05 NOTE — Assessment & Plan Note (Signed)
-  Poorly controlled in the ER -Continue home Cardizem -Will add prn IV hydralazine

## 2021-08-06 DIAGNOSIS — U071 COVID-19: Principal | ICD-10-CM

## 2021-08-06 DIAGNOSIS — N185 Chronic kidney disease, stage 5: Secondary | ICD-10-CM | POA: Diagnosis present

## 2021-08-06 LAB — CBC WITH DIFFERENTIAL/PLATELET
Abs Immature Granulocytes: 0.27 10*3/uL — ABNORMAL HIGH (ref 0.00–0.07)
Basophils Absolute: 0 10*3/uL (ref 0.0–0.1)
Basophils Relative: 1 %
Eosinophils Absolute: 0.1 10*3/uL (ref 0.0–0.5)
Eosinophils Relative: 1 %
HCT: 33.5 % — ABNORMAL LOW (ref 39.0–52.0)
Hemoglobin: 10.8 g/dL — ABNORMAL LOW (ref 13.0–17.0)
Immature Granulocytes: 4 %
Lymphocytes Relative: 19 %
Lymphs Abs: 1.2 10*3/uL (ref 0.7–4.0)
MCH: 29.1 pg (ref 26.0–34.0)
MCHC: 32.2 g/dL (ref 30.0–36.0)
MCV: 90.3 fL (ref 80.0–100.0)
Monocytes Absolute: 1.1 10*3/uL — ABNORMAL HIGH (ref 0.1–1.0)
Monocytes Relative: 17 %
Neutro Abs: 3.9 10*3/uL (ref 1.7–7.7)
Neutrophils Relative %: 58 %
Platelets: 232 10*3/uL (ref 150–400)
RBC: 3.71 MIL/uL — ABNORMAL LOW (ref 4.22–5.81)
RDW: 14.2 % (ref 11.5–15.5)
WBC: 6.5 10*3/uL (ref 4.0–10.5)
nRBC: 0 % (ref 0.0–0.2)

## 2021-08-06 LAB — GLUCOSE, CAPILLARY
Glucose-Capillary: 123 mg/dL — ABNORMAL HIGH (ref 70–99)
Glucose-Capillary: 118 mg/dL — ABNORMAL HIGH (ref 70–99)
Glucose-Capillary: 135 mg/dL — ABNORMAL HIGH (ref 70–99)
Glucose-Capillary: 143 mg/dL — ABNORMAL HIGH (ref 70–99)

## 2021-08-06 LAB — COMPREHENSIVE METABOLIC PANEL
ALT: 9 U/L (ref 0–44)
AST: 13 U/L — ABNORMAL LOW (ref 15–41)
Albumin: 2.1 g/dL — ABNORMAL LOW (ref 3.5–5.0)
Alkaline Phosphatase: 53 U/L (ref 38–126)
Anion gap: 12 (ref 5–15)
BUN: 58 mg/dL — ABNORMAL HIGH (ref 8–23)
CO2: 23 mmol/L (ref 22–32)
Calcium: 8.1 mg/dL — ABNORMAL LOW (ref 8.9–10.3)
Chloride: 109 mmol/L (ref 98–111)
Creatinine, Ser: 6.51 mg/dL — ABNORMAL HIGH (ref 0.61–1.24)
GFR, Estimated: 8 mL/min — ABNORMAL LOW (ref >60)
Glucose, Bld: 118 mg/dL — ABNORMAL HIGH (ref 70–99)
Potassium: 4.7 mmol/L (ref 3.5–5.1)
Sodium: 144 mmol/L (ref 135–145)
Total Bilirubin: 0.5 mg/dL (ref 0.3–1.2)
Total Protein: 6 g/dL — ABNORMAL LOW (ref 6.5–8.1)

## 2021-08-06 LAB — FERRITIN: Ferritin: 954 ng/mL — ABNORMAL HIGH (ref 24–336)

## 2021-08-06 LAB — MAGNESIUM: Magnesium: 2.2 mg/dL (ref 1.7–2.4)

## 2021-08-06 LAB — URINE CULTURE

## 2021-08-06 LAB — PHOSPHORUS: Phosphorus: 4.3 mg/dL (ref 2.5–4.6)

## 2021-08-06 LAB — D-DIMER, QUANTITATIVE: D-Dimer, Quant: 2.06 ug/mL-FEU — ABNORMAL HIGH (ref 0.00–0.50)

## 2021-08-06 LAB — SODIUM, URINE, RANDOM: Sodium, Ur: 100 mmol/L

## 2021-08-06 LAB — CREATININE, URINE, RANDOM: Creatinine, Urine: 49.88 mg/dL

## 2021-08-06 LAB — C-REACTIVE PROTEIN: CRP: 4.3 mg/dL — ABNORMAL HIGH (ref <1.0)

## 2021-08-06 MED ORDER — CHLORHEXIDINE GLUCONATE CLOTH 2 % EX PADS
6.0000 | MEDICATED_PAD | Freq: Every day | CUTANEOUS | Status: DC
  Administered 2021-08-06 – 2021-08-10 (×5): 6 via TOPICAL

## 2021-08-06 MED ORDER — HYDRALAZINE HCL 25 MG PO TABS
25.0000 mg | ORAL_TABLET | Freq: Three times a day (TID) | ORAL | Status: DC
  Administered 2021-08-06 – 2021-08-10 (×13): 25 mg via ORAL
  Filled 2021-08-06 (×13): qty 1

## 2021-08-06 MED ORDER — MOLNUPIRAVIR EUA 200MG CAPSULE
4.0000 | ORAL_CAPSULE | Freq: Two times a day (BID) | ORAL | Status: DC
  Administered 2021-08-06 – 2021-08-10 (×9): 800 mg via ORAL
  Filled 2021-08-06: qty 4

## 2021-08-06 MED ORDER — DEXAMETHASONE SODIUM PHOSPHATE 10 MG/ML IJ SOLN
6.0000 mg | INTRAMUSCULAR | Status: AC
  Administered 2021-08-06 – 2021-08-08 (×3): 6 mg via INTRAVENOUS
  Filled 2021-08-06 (×3): qty 0.6

## 2021-08-06 NOTE — Progress Notes (Signed)
°  Transition of Care [facility]) Screening Note   Patient Details  Name: [facility] Date of Birth: March 30, 1941   [facility]) CM/SW Contact:    Bartholomew Crews, RN Phone Number: [phone] 08/06/2021, 9:36 AM    Transition of Care Department [facility]) has reviewed patient and no TOC needs have been identified at this time. We will continue to monitor patient advancement through interdisciplinary progression rounds. If new patient transition needs arise, please place a TOC consult.  [place] notified of inpatient admission [phone] and auth# [id number]. Fax to be received at [employer] [phone].
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
## 2021-08-06 NOTE — Plan of Care (Signed)
  Problem: Clinical Measurements: Goal: Diagnostic test results will improve Outcome: Progressing   
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
## 2021-08-06 NOTE — Evaluation (Signed)
Occupational Therapy Evaluation Patient Details Name: Paul Bond MRN: [id number] DOB: 12/23/1940 Today's Date: 08/06/2021   History of Present Illness Paul Bond is a 81 y.o. male with medical history significant of DM; HTN; CVA; CKD5 s/p remote L nephrectomy, and nephrolithiasis s/p stent placement multiple times presenting with worsening COVID infection.  He initially tested positive for COVID on 08/01/21.  He has had profound poor p.o. intake over the past week, with worsening weakness, general malaise, and ambulatory dysfunction.  He notes his wife at home is also sick but is currently staying home as her symptoms are not as severe.   Clinical Impression   Paul Bond was evaluated s/p the above admission list, he reports being generally mod I PTA. He currently lives alone, his wife is in a rehab facility per pt. Upon evaluation pt was min guard for all transfers and functional ambulation. He requires up to min A for ADLs. Pt is limited by poor activity tolerance, general fatigue and poor insight to safety and benefits from cues throughout. Pt will benefit from OT acutely to progress the limitations listed below. Recommend d/c to home with [facility], plan to contact family to confirm home support and set up.    Recommendations for follow up therapy are one component of a multi-disciplinary discharge planning process, led by the attending physician.  Recommendations may be updated based on patient status, additional functional criteria and insurance authorization.   Follow Up Recommendations  Home health OT    Assistance Recommended at Discharge Intermittent Supervision/Assistance  Patient can return home with the following A Rocha help with walking and/or transfers;A Levario help with bathing/dressing/bathroom;Help with stairs or ramp for entrance;Assist for transportation;Assistance with cooking/housework    Functional Status Assessment  Patient has had a recent decline in their functional status  and demonstrates the ability to make significant improvements in function in a reasonable and predictable amount of time.  Equipment Recommendations  BSC/3in1 (RW)       Precautions / Restrictions Precautions Precautions: Fall Precaution Comments: covid+ Restrictions Weight Bearing Restrictions: No      Mobility Bed Mobility Overal bed mobility: Needs Assistance Bed Mobility: Sit to Supine       Sit to supine: Supervision        Transfers Overall transfer level: Needs assistance Equipment used: Rolling walker (2 wheels) Transfers: Sit to/from Stand Sit to Stand: Min guard                  Balance Overall balance assessment: Needs assistance Sitting-balance support: Feet supported Sitting balance-[name] Scale: Fair     Standing balance support: Single extremity supported, During functional activity Standing balance-[name] Scale: Fair Standing balance comment: statically stood during rear peri hygiene                           ADL either performed or assessed with clinical judgement   ADL Overall ADL's : Needs assistance/impaired Eating/Feeding: Sitting   Grooming: Min guard;Standing   Upper Body Bathing: Set up;Sitting   Lower Body Bathing: Min guard;Sit to/from stand   Upper Body Dressing : Set up;Sitting   Lower Body Dressing: Min guard;Sit to/from stand   Toilet Transfer: Min guard;Ambulation;Rolling walker (2 wheels)   Toileting- Clothing Manipulation and Hygiene: Min guard;Sit to/from stand Toileting - Clothing Manipulation Details (indicate cue type and reason): rear peri hygiene in standing after residual BM noted after standing from the chair     Functional mobility during  ADLs: Min guard;Rolling walker (2 wheels) General ADL Comments: slow and generalized weakness, cues for safety. more steady with RW     Vision Baseline Vision/History: 0 No visual deficits Ability to See in Adequate Light: 0 Adequate Patient Visual Report:  No change from baseline Vision Assessment?: No apparent visual deficits            Pertinent Vitals/Pain Pain Assessment Pain Assessment: Faces Faces Pain Scale: Hurts a Mckell bit Pain Location: generalized with movement Pain Descriptors / Indicators: Discomfort Pain Intervention(s): Limited activity within patient's tolerance, Monitored during session     Hand Dominance     Extremity/Trunk Assessment Upper Extremity Assessment Upper Extremity Assessment: Generalized weakness   Lower Extremity Assessment Lower Extremity Assessment: Defer to PT evaluation   Cervical / Trunk Assessment Cervical / Trunk Assessment: Kyphotic   Communication Communication Communication: No difficulties   Cognition Arousal/Alertness: Awake/alert Behavior During Therapy: Flat affect Overall Cognitive Status: Within Functional Limits for tasks assessed                     General Comments: O&Ax4, incr time but follows simple commands.     General Comments  VSS on RA            Home Living Family/patient expects to be discharged to:: Private residence Living Arrangements: Alone (pt reports his wife is in rebah right now fro her legs?)     Home Access: Stairs to enter           Bathroom Shower/Tub: Walk-in shower         Home Equipment: Kasandra Knudsen - single point          Prior Functioning/Environment Prior Level of Function : Independent/Modified Independent;Working/employed             Mobility Comments: SPC intermittently ADLs Comments: indep, drives, manages med and finances        OT Problem List: Decreased strength;Decreased range of motion;Decreased activity tolerance;Impaired balance (sitting and/or standing);Cardiopulmonary status limiting activity      OT Treatment/Interventions: Therapeutic exercise;Self-care/ADL training;DME and/or AE instruction;Therapeutic activities;Patient/family education;Balance training    OT Goals(Current goals can be  found in the care plan section) Acute Rehab OT Goals Patient Stated Goal: to feel better OT Goal Formulation: With patient Potential to Achieve Goals: Good ADL Goals Pt Will Perform Lower Body Dressing: with modified independence;sit to/from stand Pt Will Transfer to Toilet: with modified independence;ambulating Pt/caregiver will Perform Home Exercise Program: Increased strength;Both right and left upper extremity;With written HEP provided Additional ADL Goal #1: Pt will indep verbalize at least 3 energy conservation strategies to apply at d/c  OT Frequency: Min 2X/week       AM-PAC OT "6 Clicks" Daily Activity     Outcome Measure Help from another person eating meals?: None Help from another person taking care of personal grooming?: A Imes Help from another person toileting, which includes using toliet, bedpan, or urinal?: A Bussa Help from another person bathing (including washing, rinsing, drying)?: A [place] Help from another person to put on and taking off regular upper body clothing?: A Kimpel Help from another person to put on and taking off regular lower body clothing?: None 6 Click Score: 20   End of Session Equipment Utilized During Treatment: Rolling walker (2 wheels) Nurse Communication: Mobility status  Activity Tolerance: Patient tolerated treatment well Patient left: in bed;with call bell/phone within reach;with bed alarm set  OT Visit Diagnosis: Unsteadiness on feet (R26.81);Other abnormalities of gait and mobility (  R26.89);Muscle weakness (generalized) (M62.81)                Time: 9150-4136 OT Time Calculation (min): 18 min Charges:  OT General Charges $OT Visit: 1 Visit OT Evaluation $OT Eval Moderate Complexity: 1 Mod   Paulino Cork A Harvest Deist 08/06/2021, 4:05 PM

## 2021-08-06 NOTE — Evaluation (Signed)
Physical Therapy Evaluation Patient Details Name: Paul Bond MRN: [id number] DOB: Jun 14, 1940 Today's Date: 08/06/2021  History of Present Illness  Paul Bond is a 81 y.o. male with medical history significant of DM; HTN; CVA; CKD5 s/p remote L nephrectomy, and nephrolithiasis s/p stent placement multiple times presenting with worsening COVID infection.  He initially tested positive for COVID on 08/01/21.  He has had profound poor p.o. intake over the past week, with worsening weakness, general malaise, and ambulatory dysfunction.  He notes his wife at home is also sick but is currently staying home as her symptoms are not as severe.  Clinical Impression   Pt admitted with above diagnosis. Lives at home with wife (wife is currently undergoing reahb, so he will be alone), in a single-level home;  Prior to admission, pt was independent/mod I, driving; Presents to PT with generalized weakness and malaise, and well as fatigue with minimal activity;  Pt currently with functional limitations due to the deficits listed below (see PT Problem List). Pt will benefit from skilled PT to increase their independence and safety with mobility to allow discharge to the venue listed below.          Recommendations for follow up therapy are one component of a multi-disciplinary discharge planning process, led by the attending physician.  Recommendations may be updated based on patient status, additional functional criteria and insurance authorization.  Follow Up Recommendations Home health PT    Assistance Recommended at Discharge Set up Supervision/Assistance  Patient can return home with the following  Assistance with cooking/housework    Equipment Recommendations Rollator (4 wheels)  Recommendations for Other Services       Functional Status Assessment Patient has had a recent decline in their functional status and demonstrates the ability to make significant improvements in function in a reasonable and  predictable amount of time.     Precautions / Restrictions Precautions Precautions: Fall Precaution Comments: covid+ Restrictions Weight Bearing Restrictions: No      Mobility  Bed Mobility Overal bed mobility: Needs Assistance Bed Mobility: Supine to Sit     Supine to sit: Min assist     General bed mobility comments: Min handheld assist to pull to sit    Transfers Overall transfer level: Needs assistance Equipment used: 1 person hand held assist Transfers: Sit to/from Stand, Bed to chair/wheelchair/BSC Sit to Stand: Min assist   Step pivot transfers: Min assist       General transfer comment: Min assist to steady at initial stand; Handheld assist to steady during pivotal steps bed to recliner    Ambulation/Gait                  Stairs            Wheelchair Mobility    Modified Rankin (Stroke Patients Only)       Balance     Sitting balance-[name] Scale: Fair       Standing balance-[name] Scale: Fair                               Pertinent Vitals/Pain Pain Assessment Pain Assessment: Faces Faces Pain Scale: Hurts a [place] bit Pain Location: generalized with movement Pain Descriptors / Indicators: Discomfort Pain Intervention(s): Monitored during session, Limited activity within patient's tolerance    Home Living Family/patient expects to be discharged to:: Private residence Living Arrangements: Alone (pt reports his wife is in [facility]ah right now fro  her legs?)     Home Access: Stairs to enter   [employer] of Steps: 3   Home Layout: One level Home Equipment: Cane - single point      Prior Function Prior Level of Function : Independent/Modified Independent;Working/employed             Mobility Comments: SPC intermittently ADLs Comments: indep, drives, manages med and finances     Hand Dominance        Extremity/Trunk Assessment   Upper Extremity Assessment Upper Extremity Assessment: Defer  to OT evaluation    Lower Extremity Assessment Lower Extremity Assessment: Generalized weakness    Cervical / Trunk Assessment Cervical / Trunk Assessment: Kyphotic  Communication   Communication: No difficulties  Cognition Arousal/Alertness: Awake/alert Behavior During Therapy: Flat affect Overall Cognitive Status: Within Functional Limits for tasks assessed                                 General Comments: O&Ax4, incr time but follows simple commands.        General Comments General comments (skin integrity, edema, etc.): No DOE on room air    Exercises     Assessment/Plan    PT Assessment Patient needs continued PT services  PT Problem List Decreased strength;Decreased activity tolerance;Decreased balance;Decreased mobility;Decreased knowledge of use of DME;Decreased safety awareness;Decreased knowledge of precautions;Cardiopulmonary status limiting activity;Pain       PT Treatment Interventions DME instruction;Gait training;Stair training;Functional mobility training;Therapeutic activities;Therapeutic exercise;Balance training;Patient/family education    PT Goals (Current goals can be found in the Care Plan section)  Acute Rehab PT Goals Patient Stated Goal: Home soon PT Goal Formulation: With patient Time For Goal Achievement: 08/20/21 Potential to Achieve Goals: Good    Frequency Min 3X/week     Co-evaluation               AM-PAC PT "6 Clicks" Mobility  Outcome Measure Help needed turning from your back to your side while in a flat bed without using bedrails?: A Decuir Help needed moving from lying on your back to sitting on the side of a flat bed without using bedrails?: A Salais Help needed moving to and from a bed to a chair (including a wheelchair)?: A Yaffe Help needed standing up from a chair using your arms (e.g., wheelchair or bedside chair)?: A Iturralde Help needed to walk in hospital room?: A Schlarb Help needed climbing 3-5  steps with a railing? : A [place] 6 Click Score: 18    End of Session   Activity Tolerance: Patient tolerated treatment well Patient left: in chair;with call bell/phone within reach;with chair alarm set Nurse Communication: Mobility status PT Visit Diagnosis: Unsteadiness on feet (R26.81);Muscle weakness (generalized) (M62.81)    Time: 1829-9371 PT Time Calculation (min) (ACUTE ONLY): 17 min   Charges:   PT Evaluation $PT Eval Moderate Complexity: 1 Mod          Roney [place], [place]  Acute Rehabilitation Services Pager [phone] Office [phone]   Colletta [place] 08/06/2021, 4:20 PM

## 2021-08-07 LAB — CBC WITH DIFFERENTIAL/PLATELET
Abs Immature Granulocytes: 0.31 10*3/uL — ABNORMAL HIGH (ref 0.00–0.07)
Basophils Absolute: 0 10*3/uL (ref 0.0–0.1)
Basophils Relative: 1 %
Eosinophils Absolute: 0 10*3/uL (ref 0.0–0.5)
Eosinophils Relative: 0 %
HCT: 31.5 % — ABNORMAL LOW (ref 39.0–52.0)
Hemoglobin: 10.6 g/dL — ABNORMAL LOW (ref 13.0–17.0)
Immature Granulocytes: 5 %
Lymphocytes Relative: 28 %
Lymphs Abs: 1.6 10*3/uL (ref 0.7–4.0)
MCH: 29.9 pg (ref 26.0–34.0)
MCHC: 33.7 g/dL (ref 30.0–36.0)
MCV: 89 fL (ref 80.0–100.0)
Monocytes Absolute: 1.1 10*3/uL — ABNORMAL HIGH (ref 0.1–1.0)
Monocytes Relative: 19 %
Neutro Abs: 2.7 10*3/uL (ref 1.7–7.7)
Neutrophils Relative %: 47 %
Platelets: 222 10*3/uL (ref 150–400)
RBC: 3.54 MIL/uL — ABNORMAL LOW (ref 4.22–5.81)
RDW: 14 % (ref 11.5–15.5)
WBC: 5.8 10*3/uL (ref 4.0–10.5)
nRBC: 0 % (ref 0.0–0.2)

## 2021-08-07 LAB — MAGNESIUM: Magnesium: 1.9 mg/dL (ref 1.7–2.4)

## 2021-08-07 LAB — GLUCOSE, CAPILLARY
Glucose-Capillary: 165 mg/dL — ABNORMAL HIGH (ref 70–99)
Glucose-Capillary: 123 mg/dL — ABNORMAL HIGH (ref 70–99)
Glucose-Capillary: 188 mg/dL — ABNORMAL HIGH (ref 70–99)
Glucose-Capillary: 375 mg/dL — ABNORMAL HIGH (ref 70–99)

## 2021-08-07 LAB — FERRITIN: Ferritin: 1088 ng/mL — ABNORMAL HIGH (ref 24–336)

## 2021-08-07 LAB — COMPREHENSIVE METABOLIC PANEL
ALT: 10 U/L (ref 0–44)
AST: 16 U/L (ref 15–41)
Albumin: 1.8 g/dL — ABNORMAL LOW (ref 3.5–5.0)
Alkaline Phosphatase: 53 U/L (ref 38–126)
Anion gap: 9 (ref 5–15)
BUN: 49 mg/dL — ABNORMAL HIGH (ref 8–23)
CO2: 26 mmol/L (ref 22–32)
Calcium: 7.7 mg/dL — ABNORMAL LOW (ref 8.9–10.3)
Chloride: 106 mmol/L (ref 98–111)
Creatinine, Ser: 5.41 mg/dL — ABNORMAL HIGH (ref 0.61–1.24)
GFR, Estimated: 10 mL/min — ABNORMAL LOW (ref >60)
Glucose, Bld: 149 mg/dL — ABNORMAL HIGH (ref 70–99)
Potassium: 3.7 mmol/L (ref 3.5–5.1)
Sodium: 141 mmol/L (ref 135–145)
Total Bilirubin: 0.5 mg/dL (ref 0.3–1.2)
Total Protein: 5.4 g/dL — ABNORMAL LOW (ref 6.5–8.1)

## 2021-08-07 LAB — D-DIMER, QUANTITATIVE: D-Dimer, Quant: 0.78 ug/mL-FEU — ABNORMAL HIGH (ref 0.00–0.50)

## 2021-08-07 LAB — C-REACTIVE PROTEIN: CRP: 5.7 mg/dL — ABNORMAL HIGH (ref <1.0)

## 2021-08-07 LAB — PHOSPHORUS: Phosphorus: 3.6 mg/dL (ref 2.5–4.6)

## 2021-08-07 NOTE — Progress Notes (Signed)
Attempted to contact wife per her request with update x2. Mailbox is not set up; unable to leave a VM.

## 2021-08-07 NOTE — Plan of Care (Signed)
Problem: Education: °Goal: Knowledge of General Education information will improve °Description: Including pain rating scale, medication(s)/side effects and non-pharmacologic comfort measures °Outcome: Completed/Met °  °
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
## 2021-08-07 NOTE — Plan of Care (Signed)
Problem: Education: Goal: Knowledge of General Education information will improve Description: Including pain rating scale, medication(s)/side effects and non-pharmacologic comfort measures Outcome: Completed/Met   Problem: Education: Goal: Knowledge of risk factors and measures for prevention of condition will improve Outcome: Completed/Met
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
## 2021-08-07 NOTE — TOC Initial Note (Addendum)
Transition of Care [facility]) - Initial/Assessment Note    Patient Details  Name: Paul Bond MRN: [id number] Date of Birth: 03-Apr-1941  [facility] CM/SW Contact:    Tom-Johnson, Renea Ee, RN Phone Number: 08/07/2021, 1:21 PM  Clinical Narrative:                  CM spoke with patient via phone about home health PT/OT recommendations. Patient states he lives alone at home. Wife is currently at [facility] and might transition to long term. Patient is with [facility] and [facility] CM informed them of admission. List of home health agencies read to patient and he chose [facility]. [facility] in to [facility] with acceptance voiced. Information on AVS. BSC and Rollator ordered from [employer] and Freda Munro to deliver at bedside. CM will continue to follow with needs.   Expected Discharge Plan: [place] Barriers to Discharge: Continued Medical Work up   Patient Goals and CMS Choice Patient states their goals for this hospitalization and ongoing recovery are:: To return home CMS Medicare.gov Compare Post Acute Care list provided to:: Patient Choice offered to / list presented to : Patient  Expected Discharge Plan and Services Expected Discharge Plan: [place]   Discharge Planning Services: CM Consult Post Acute Care Choice: [place] arrangements for the past 2 months: Single Family Home                 DME Arranged: 3-N-1, Walker rolling with seat DME Agency: [employer] Date DME Agency Contacted: 08/07/21 Time DME Agency Contacted: 53 Representative spoke with at DME Agency: Freda [facility]ro HH Arranged: PT, OT [place] Agency: [place] Date [place]: 08/07/21 Time [place]: 63 Representative spoke with at [place]: [place] Arrangements/Services Living arrangements for the past 2 months: [place] with:: Self ([facility] Patient  language and need for interpreter reviewed:: Yes Do you feel safe going back to the place where you live?: Yes      Need for Family Participation in Patient Care: Yes (Comment) Care giver support system in place?: Yes (comment)   Criminal Activity/Legal Involvement Pertinent to Current Situation/Hospitalization: No - Comment as needed  Activities of Daily Living Home Assistive Devices/Equipment: None ADL Screening (condition at time of admission) Patient's cognitive ability adequate to safely complete daily activities?: Yes Is the patient deaf or have difficulty hearing?: No Does the patient have difficulty seeing, even when wearing glasses/contacts?: No Does the patient have difficulty concentrating, remembering, or making decisions?: No Patient able to express need for assistance with ADLs?: Yes Does the patient have difficulty dressing or bathing?: No Independently performs ADLs?: No Communication: Independent Dressing (OT): Needs assistance Is this a change from baseline?: Pre-admission baseline Grooming: Needs assistance Is this a change from baseline?: Pre-admission baseline Feeding: Independent Bathing: Needs assistance Toileting: Needs assistance Is this a change from baseline?: Pre-admission baseline In/Out Bed: Needs assistance Is this a change from baseline?: Pre-admission baseline Does the patient have difficulty walking or climbing stairs?: Yes Weakness of Legs: Both Weakness of Arms/Hands: None  Permission Sought/Granted Permission sought to share information with : Case Manager, Customer service manager, Family Supports Permission granted to share information with : Yes, Verbal Permission Granted  Share Information with NAME: Amy  Permission granted to share info w AGENCY: [facility]        Emotional Assessment Appearance:: Appears stated age Attitude/Demeanor/Rapport: Engaged,  Gracious Affect (typically observed): Accepting, Appropriate, Calm,  Hopeful Orientation: : Oriented to Self, Oriented to Place, Oriented to Situation Alcohol / Substance Use: Not Applicable Psych Involvement: No (comment)  Admission diagnosis:  Renal dysfunction [N28.9] Failure to thrive in adult [R62.7] Hypertensive urgency [I16.0] COVID-19 virus infection [U07.1] Patient Active Problem List   Diagnosis Date Noted   CKD (chronic kidney disease) stage 5, GFR less than 15 ml/min ([place]) 08/06/2021   COVID-19 virus infection 08/05/2021   Type 2 diabetes mellitus without complication, with long-term current use of insulin ([place]) 08/05/2021   Hypertension 08/05/2021   Stroke ([place]) 08/05/2021   Heme positive stool    Anemia    Benign neoplasm of colon    Colonic ulcer    Acute esophagitis    Obstructive uropathy 02/13/2020   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   Severe sepsis ([place]) 02/13/2020   Protein-calorie malnutrition, severe 02/11/2020   AKI (acute kidney injury) ([place]) 02/02/2020   Hyperkalemia 02/02/2020   Hypermagnesemia 02/02/2020   Hypernatremia 04/26/5207   Metabolic acidosis 08/03/3610   PCP:  [facility]:   [place], [place] [address] Phone: [phone] Fax: [place], [place] - [place] [address] [place] [address] Phone: [phone] Fax: [phone]     Social Determinants of Health (SDOH) Interventions    Readmission Risk Interventions Readmission Risk Prevention Plan 08/07/2021  Transportation Screening Complete  PCP or Specialist Appt within 3-5 Days Complete  HRI or Home Care Consult Complete  Social Work Consult for [place] Planning/Counseling Complete  Palliative Care Screening Not Applicable  Medication Review Press photographer) Complete  Some recent data might be hidden

## 2021-08-07 NOTE — Plan of Care (Signed)
°  Problem: Coping: °Goal: Level of anxiety will decrease °Outcome: Progressing °  °

## 2021-08-08 LAB — CBC WITH DIFFERENTIAL/PLATELET
Abs Immature Granulocytes: 0 10*3/uL (ref 0.00–0.07)
Basophils Absolute: 0 10*3/uL (ref 0.0–0.1)
Basophils Relative: 0 %
Eosinophils Absolute: 0 10*3/uL (ref 0.0–0.5)
Eosinophils Relative: 0 %
HCT: 31.2 % — ABNORMAL LOW (ref 39.0–52.0)
Hemoglobin: 10.4 g/dL — ABNORMAL LOW (ref 13.0–17.0)
Lymphocytes Relative: 13 %
Lymphs Abs: 0.4 10*3/uL — ABNORMAL LOW (ref 0.7–4.0)
MCH: 29.4 pg (ref 26.0–34.0)
MCHC: 33.3 g/dL (ref 30.0–36.0)
MCV: 88.1 fL (ref 80.0–100.0)
Monocytes Absolute: 0.2 10*3/uL (ref 0.1–1.0)
Monocytes Relative: 8 %
Neutro Abs: 2.4 10*3/uL (ref 1.7–7.7)
Neutrophils Relative %: 79 %
Platelets: 203 10*3/uL (ref 150–400)
RBC: 3.54 MIL/uL — ABNORMAL LOW (ref 4.22–5.81)
RDW: 13.7 % (ref 11.5–15.5)
WBC: 3 10*3/uL — ABNORMAL LOW (ref 4.0–10.5)
nRBC: 0.7 % — ABNORMAL HIGH (ref 0.0–0.2)
nRBC: 2 /100 WBC — ABNORMAL HIGH

## 2021-08-08 LAB — COMPREHENSIVE METABOLIC PANEL
ALT: 10 U/L (ref 0–44)
AST: 12 U/L — ABNORMAL LOW (ref 15–41)
Albumin: 1.7 g/dL — ABNORMAL LOW (ref 3.5–5.0)
Alkaline Phosphatase: 52 U/L (ref 38–126)
Anion gap: 9 (ref 5–15)
BUN: 51 mg/dL — ABNORMAL HIGH (ref 8–23)
CO2: 30 mmol/L (ref 22–32)
Calcium: 7.6 mg/dL — ABNORMAL LOW (ref 8.9–10.3)
Chloride: 98 mmol/L (ref 98–111)
Creatinine, Ser: 5.43 mg/dL — ABNORMAL HIGH (ref 0.61–1.24)
GFR, Estimated: 10 mL/min — ABNORMAL LOW (ref >60)
Glucose, Bld: 412 mg/dL — ABNORMAL HIGH (ref 70–99)
Potassium: 4.1 mmol/L (ref 3.5–5.1)
Sodium: 137 mmol/L (ref 135–145)
Total Bilirubin: 0.4 mg/dL (ref 0.3–1.2)
Total Protein: 5.1 g/dL — ABNORMAL LOW (ref 6.5–8.1)

## 2021-08-08 LAB — GLUCOSE, CAPILLARY
Glucose-Capillary: 340 mg/dL — ABNORMAL HIGH (ref 70–99)
Glucose-Capillary: 330 mg/dL — ABNORMAL HIGH (ref 70–99)
Glucose-Capillary: 311 mg/dL — ABNORMAL HIGH (ref 70–99)
Glucose-Capillary: 277 mg/dL — ABNORMAL HIGH (ref 70–99)

## 2021-08-08 LAB — FERRITIN: Ferritin: 1088 ng/mL — ABNORMAL HIGH (ref 24–336)

## 2021-08-08 LAB — D-DIMER, QUANTITATIVE: D-Dimer, Quant: 0.51 ug/mL-FEU — ABNORMAL HIGH (ref 0.00–0.50)

## 2021-08-08 LAB — C-REACTIVE PROTEIN: CRP: 4.2 mg/dL — ABNORMAL HIGH (ref <1.0)

## 2021-08-08 MED ORDER — LATANOPROST 0.005 % OP SOLN
1.0000 [drp] | Freq: Every evening | OPHTHALMIC | Status: DC
  Administered 2021-08-08 – 2021-08-09 (×2): 1 [drp] via OPHTHALMIC
  Filled 2021-08-08: qty 2.5

## 2021-08-08 MED ORDER — INSULIN GLARGINE-YFGN 100 UNIT/ML ~~LOC~~ SOLN
8.0000 [IU] | Freq: Every day | SUBCUTANEOUS | Status: DC
  Administered 2021-08-08: 8 [IU] via SUBCUTANEOUS
  Filled 2021-08-08 (×2): qty 0.08
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
## 2021-08-08 NOTE — Plan of Care (Signed)
  Problem: Health Behavior/Discharge Planning: Goal: Ability to manage health-related needs will improve Outcome: Progressing   Problem: Clinical Measurements: Goal: Ability to maintain clinical measurements within normal limits will improve Outcome: Progressing   

## 2021-08-08 NOTE — Progress Notes (Signed)
Physical Therapy Treatment Patient Details Name: Paul Bond MRN: [id number] DOB: 1940/11/07 Today's Date: 08/08/2021   History of Present Illness Paul Bond is Paul 81 y.o. male with medical history significant of DM; HTN; CVA; CKD5 s/p remote L nephrectomy, and nephrolithiasis s/p stent placement multiple times presenting with worsening COVID infection.  He initially tested positive for COVID on 08/01/21.  He has had profound poor p.o. intake over the past week, with worsening weakness, general malaise, and ambulatory dysfunction.     PT Comments    Continuing work on functional mobility and activity tolerance;  Walked laps in room with rollator RW, and overall tolerated quite well; moderately fatigued at end of session   Recommendations for follow up therapy are one component of Paul multi-disciplinary discharge planning process, led by the attending physician.  Recommendations may be updated based on patient status, additional functional criteria and insurance authorization.  Follow Up Recommendations  Home health PT     Assistance Recommended at Discharge Set up Supervision/Assistance  Patient can return home with the following Assistance with cooking/housework   Equipment Recommendations  Rollator (4 wheels)    Recommendations for Other Services       Precautions / Restrictions Precautions Precautions: Fall Precaution Comments: covid+     Mobility  Bed Mobility                    Transfers Overall transfer level: Needs assistance Equipment used: Rollator (4 wheels) Transfers: Sit to/from Stand Sit to Stand: Min guard           General transfer comment: Slow rise, but no physical assist needed    Ambulation/Gait Ambulation/Gait assistance: Min guard (with and without physical contact ) Gait Distance (Feet): 100 Feet Assistive device: Rollator (4 wheels) Gait Pattern/deviations: Step-through pattern       General Gait Details: Cues to self-monitor  for activity tolerance    Stairs             Wheelchair Mobility    Modified Rankin (Stroke Patients Only)       Balance     Sitting balance-[name] Scale: Fair       Standing balance-[name] Scale: Fair                              Cognition Arousal/Alertness: Awake/alert Behavior During Therapy: WFL for tasks assessed/performed Overall Cognitive Status: Within Functional Limits for tasks assessed                                          Exercises      General Comments General comments (skin integrity, edema, etc.): No DEO of Room Air, O2 sats 98%; Reports being moderately tired after walk      Pertinent Vitals/Pain Pain Assessment Pain Assessment: No/denies pain Pain Intervention(s): Monitored during session    Home Living                          Prior Function            PT Goals (current goals can now be found in the care plan section) Acute Rehab PT Goals Patient Stated Goal: Home soon PT Goal Formulation: With patient Time For Goal Achievement: 08/20/21 Potential to Achieve Goals: Good    Frequency  Min 3X/week      PT Plan Current plan remains appropriate    Co-evaluation              AM-PAC PT "6 Clicks" Mobility   Outcome Measure  Help needed turning from your back to your side while in Paul flat bed without using bedrails?: Paul Bond Help needed moving from lying on your back to sitting on the side of Paul flat bed without using bedrails?: Paul Bond Help needed moving to and from Paul bed to Paul chair (including Paul wheelchair)?: Paul Bond Help needed standing up from Paul chair using your arms (e.g., wheelchair or bedside chair)?: Paul Bond Help needed to walk in hospital room?: Paul Bond Help needed climbing 3-5 steps with Paul railing? : Paul Bond 6 Click Score: 18    End of Session   Activity Tolerance: Patient tolerated treatment well Patient left: in chair;with call bell/phone within reach;with  chair alarm set Nurse Communication: Mobility status PT Visit Diagnosis: Unsteadiness on feet (R26.81);Muscle weakness (generalized) (M62.81)     Time: 3007-6226 PT Time Calculation (min) (ACUTE ONLY): 26 min  Charges:  $Gait Training: 23-37 mins                     Paul Bond, [place]  Acute Rehabilitation Services Pager [phone] Office Paul Bond 08/08/2021, 3:49 PM

## 2021-08-08 NOTE — Progress Notes (Signed)
Inpatient Diabetes Program Recommendations  AACE/ADA: New Consensus Statement on Inpatient Glycemic Control (2015)  Target Ranges:  Prepandial:   less than 140 mg/dL      Peak postprandial:   less than 180 mg/dL (1-2 hours)      Critically ill patients:  140 - 180 mg/dL   Lab Results  Component Value Date   GLUCAP 340 (H) 08/08/2021   HGBA1C 7.9 (H) 02/02/2020    Review of Glycemic Control  Latest Reference Range & Units 08/06/21 21:28 08/07/21 07:40 08/07/21 11:46 08/07/21 17:03 08/07/21 20:32 08/08/21 07:32  Glucose-Capillary 70 - 99 mg/dL 143 (H) 165 (H) 123 (H) 188 (H) 375 (H) 340 (H)   Diabetes history: DM 2 Outpatient Diabetes medications:  Novolog 5 units q AM Semglee 8 units daily Current orders for Inpatient glycemic control:  Novolog sensitive tid with meals Decadron 6 mg daily  Inpatient Diabetes Program Recommendations:    Please consider adding Semglee 8 units daily and Novolog 2 units tid with meals (hold if patient eats less than 50% or NPO).   Thanks,  Adah Perl, RN, BC-ADM Inpatient Diabetes Coordinator Pager [phone]  (8a-5p)

## 2021-08-09 DIAGNOSIS — N179 Acute kidney failure, unspecified: Secondary | ICD-10-CM

## 2021-08-09 DIAGNOSIS — Z794 Long term (current) use of insulin: Secondary | ICD-10-CM

## 2021-08-09 DIAGNOSIS — E119 Type 2 diabetes mellitus without complications: Secondary | ICD-10-CM

## 2021-08-09 DIAGNOSIS — Z905 Acquired absence of kidney: Secondary | ICD-10-CM

## 2021-08-09 DIAGNOSIS — N185 Chronic kidney disease, stage 5: Secondary | ICD-10-CM

## 2021-08-09 DIAGNOSIS — I10 Essential (primary) hypertension: Secondary | ICD-10-CM

## 2021-08-09 LAB — CBC WITH DIFFERENTIAL/PLATELET
Abs Immature Granulocytes: 0.32 10*3/uL — ABNORMAL HIGH (ref 0.00–0.07)
Basophils Absolute: 0 10*3/uL (ref 0.0–0.1)
Basophils Relative: 0 %
Eosinophils Absolute: 0 10*3/uL (ref 0.0–0.5)
Eosinophils Relative: 0 %
HCT: 30.8 % — ABNORMAL LOW (ref 39.0–52.0)
Hemoglobin: 10.2 g/dL — ABNORMAL LOW (ref 13.0–17.0)
Immature Granulocytes: 4 %
Lymphocytes Relative: 13 %
Lymphs Abs: 1.1 10*3/uL (ref 0.7–4.0)
MCH: 29.3 pg (ref 26.0–34.0)
MCHC: 33.1 g/dL (ref 30.0–36.0)
MCV: 88.5 fL (ref 80.0–100.0)
Monocytes Absolute: 0.8 10*3/uL (ref 0.1–1.0)
Monocytes Relative: 9 %
Neutro Abs: 6.4 10*3/uL (ref 1.7–7.7)
Neutrophils Relative %: 74 %
Platelets: 234 10*3/uL (ref 150–400)
RBC: 3.48 MIL/uL — ABNORMAL LOW (ref 4.22–5.81)
RDW: 13.6 % (ref 11.5–15.5)
WBC: 8.6 10*3/uL (ref 4.0–10.5)
nRBC: 0.5 % — ABNORMAL HIGH (ref 0.0–0.2)

## 2021-08-09 LAB — COMPREHENSIVE METABOLIC PANEL
ALT: 9 U/L (ref 0–44)
AST: 15 U/L (ref 15–41)
Albumin: 1.9 g/dL — ABNORMAL LOW (ref 3.5–5.0)
Alkaline Phosphatase: 57 U/L (ref 38–126)
Anion gap: 10 (ref 5–15)
BUN: 64 mg/dL — ABNORMAL HIGH (ref 8–23)
CO2: 30 mmol/L (ref 22–32)
Calcium: 7.7 mg/dL — ABNORMAL LOW (ref 8.9–10.3)
Chloride: 97 mmol/L — ABNORMAL LOW (ref 98–111)
Creatinine, Ser: 5.46 mg/dL — ABNORMAL HIGH (ref 0.61–1.24)
GFR, Estimated: 10 mL/min — ABNORMAL LOW (ref >60)
Glucose, Bld: 278 mg/dL — ABNORMAL HIGH (ref 70–99)
Potassium: 4.1 mmol/L (ref 3.5–5.1)
Sodium: 137 mmol/L (ref 135–145)
Total Bilirubin: 0.3 mg/dL (ref 0.3–1.2)
Total Protein: 5.3 g/dL — ABNORMAL LOW (ref 6.5–8.1)

## 2021-08-09 LAB — GLUCOSE, CAPILLARY
Glucose-Capillary: 259 mg/dL — ABNORMAL HIGH (ref 70–99)
Glucose-Capillary: 300 mg/dL — ABNORMAL HIGH (ref 70–99)
Glucose-Capillary: 251 mg/dL — ABNORMAL HIGH (ref 70–99)
Glucose-Capillary: 163 mg/dL — ABNORMAL HIGH (ref 70–99)

## 2021-08-09 LAB — C-REACTIVE PROTEIN: CRP: 2.4 mg/dL — ABNORMAL HIGH (ref <1.0)

## 2021-08-09 LAB — FERRITIN: Ferritin: 1075 ng/mL — ABNORMAL HIGH (ref 24–336)

## 2021-08-09 LAB — D-DIMER, QUANTITATIVE: D-Dimer, Quant: 0.58 ug/mL-FEU — ABNORMAL HIGH (ref 0.00–0.50)

## 2021-08-09 MED ORDER — INSULIN GLARGINE-YFGN 100 UNIT/ML ~~LOC~~ SOLN
10.0000 [IU] | Freq: Every day | SUBCUTANEOUS | Status: DC
  Administered 2021-08-09 – 2021-08-10 (×2): 10 [IU] via SUBCUTANEOUS
  Filled 2021-08-09 (×3): qty 0.1

## 2021-08-09 MED ORDER — INSULIN ASPART 100 UNIT/ML IJ SOLN
2.0000 [IU] | Freq: Three times a day (TID) | INTRAMUSCULAR | Status: DC
  Administered 2021-08-09 – 2021-08-10 (×3): 2 [IU] via SUBCUTANEOUS
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
## 2021-08-09 NOTE — Progress Notes (Signed)
Physical Therapy Treatment ?Patient Details ?Name: Paul Bond ?MRN: [id number] ?DOB: 06-Oct-1940 ?Today's Date: 08/09/2021 ? ? ?History of Present Illness Paul Bond is a 81 y.o. male with medical history significant of DM; HTN; CVA; CKD5 s/p remote L nephrectomy, and nephrolithiasis s/p stent placement multiple times presenting with worsening COVID infection.  He initially tested positive for COVID on 08/01/21.  He has had profound poor p.o. intake over the past week, with worsening weakness, general malaise, and ambulatory dysfunction.  He notes his wife at home is also sick but is currently staying home as her symptoms are not as severe. ? ?  ?PT Comments  ? ? Continuing work on functional mobility and activity tolerance;  Improving gait distance and activity tolerance; On track for dc tomorrow   ?Recommendations for follow up therapy are one component of a multi-disciplinary discharge planning process, led by the attending physician.  Recommendations may be updated based on patient status, additional functional criteria and insurance authorization. ? ?Follow Up Recommendations ? Home health PT ?  ?  ?Assistance Recommended at Discharge Set up Supervision/Assistance  ?Patient can return home with the following Assistance with cooking/housework ?  ?Equipment Recommendations ? Rollator (4 wheels) (already has one)  ?  ?Recommendations for Other Services   ? ? ?  ?Precautions / Restrictions Precautions ?Precautions: Fall ?Precaution Comments: covid+  ?  ? ?Mobility ? Bed Mobility ?Overal bed mobility: Needs Assistance ?Bed Mobility: Supine to Sit ?  ?  ?Supine to sit: Min guard ?  ?  ?General bed mobility comments: Incr time, but no need for phsyical assist ?  ? ?Transfers ?Overall transfer level: Needs assistance ?Equipment used: Rollator (4 wheels) ?Transfers: Sit to/from Stand ?Sit to Stand: Min guard ?  ?  ?  ?  ?  ?General transfer comment: Slow rise, but no physical assist needed ?   ? ?Ambulation/Gait ?Ambulation/Gait assistance: Min guard (with and without physical contact) ?Gait Distance (Feet): 120 Feet ?Assistive device: Rollator (4 wheels) ?Gait Pattern/deviations: Step-through pattern ?  ?  ?  ?General Gait Details: Cues to self-monitor for activity tolerance ? ? ? ?Stairs ?  ?  ?  ?  ?  ? ? ?Wheelchair Mobility ?  ? ?Modified Rankin (Stroke Patients Only) ?  ? ? ?  ?Balance   ?  ?Sitting balance-[name] Scale: Fair ?  ?  ?  ?Standing balance-[name] Scale: Fair ?  ?  ?  ?  ?  ?  ?  ?  ?  ?  ?  ?  ?  ? ?  ?Cognition Arousal/Alertness: Awake/alert ?Behavior During Therapy: [facility] for tasks assessed/performed ?Overall Cognitive Status: Within Functional Limits for tasks assessed ?  ?  ?  ?  ?  ?  ?  ?  ?  ?  ?  ?  ?  ?  ?  ?  ?General Comments: O&Ax4, incr time but follows simple commands. ?  ?  ? ?  ?Exercises   ? ?  ?General Comments General comments (skin integrity, edema, etc.): Mild DOE, 2/4 with amb on Room Air ?  ?  ? ?Pertinent Vitals/Pain Pain Assessment ?Pain Assessment: No/denies pain ?Pain Intervention(s): Monitored during session  ? ? ?Home Living   ?  ?  ?  ?  ?  ?  ?  ?  ?  ?   ?  ?Prior Function    ?  ?  ?   ? ?PT Goals (current goals can now be found  in the care plan section) Acute Rehab PT Goals ?Patient Stated Goal: Home soon ?PT Goal Formulation: With patient ?Time For Goal Achievement: 08/20/21 ?Potential to Achieve Goals: Good ?Progress towards PT goals: Progressing toward goals ? ?  ?Frequency ? ? ? Min 3X/week ? ? ? ?  ?PT Plan Current plan remains appropriate  ? ? ?Co-evaluation   ?  ?  ?  ?  ? ?  ?AM-PAC PT "6 Clicks" Mobility   ?Outcome Measure ? Help needed turning from your back to your side while in a flat bed without using bedrails?: A Akel ?Help needed moving from lying on your back to sitting on the side of a flat bed without using bedrails?: A [place] ?Help needed moving to and from a bed to a chair (including a wheelchair)?: A Mcmonigle ?Help needed standing up  from a chair using your arms (e.g., wheelchair or bedside chair)?: A Grippi ?Help needed to walk in hospital room?: A Rogel ?Help needed climbing 3-5 steps with a railing? : A Frumkin ?6 Click Score: 18 ? ?  ?End of Session Equipment Utilized During Treatment: Gait belt ?Activity Tolerance: Patient tolerated treatment well ?Patient left: in chair;with call bell/phone within reach;with chair alarm set ?Nurse Communication: Mobility status ?PT Visit Diagnosis: Unsteadiness on feet (R26.81);Muscle weakness (generalized) (M62.81) ?  ? ? ?Time: 2902-1115 ?PT Time Calculation (min) (ACUTE ONLY): 18 min ? ?Charges:  $Gait Training: 8-22 mins          ?          ? ?Roney Marion, PT  ?Acute Rehabilitation Services ?Pager [phone] ?Office [phone] ? ? ? ?[place] ?08/09/2021, 4:03 PM ? ?

## 2021-08-09 NOTE — Plan of Care (Signed)
  Problem: Health Behavior/Discharge Planning: Goal: Ability to manage health-related needs will improve Outcome: Progressing   Problem: Clinical Measurements: Goal: Ability to maintain clinical measurements within normal limits will improve Outcome: Progressing   

## 2021-08-10 LAB — GLUCOSE, CAPILLARY
Glucose-Capillary: 128 mg/dL — ABNORMAL HIGH (ref 70–99)
Glucose-Capillary: 124 mg/dL — ABNORMAL HIGH (ref 70–99)

## 2021-08-10 LAB — COMPREHENSIVE METABOLIC PANEL
ALT: 12 U/L (ref 0–44)
AST: 19 U/L (ref 15–41)
Albumin: 1.8 g/dL — ABNORMAL LOW (ref 3.5–5.0)
Alkaline Phosphatase: 48 U/L (ref 38–126)
Anion gap: 12 (ref 5–15)
BUN: 73 mg/dL — ABNORMAL HIGH (ref 8–23)
CO2: 30 mmol/L (ref 22–32)
Calcium: 7.7 mg/dL — ABNORMAL LOW (ref 8.9–10.3)
Chloride: 98 mmol/L (ref 98–111)
Creatinine, Ser: 5.73 mg/dL — ABNORMAL HIGH (ref 0.61–1.24)
GFR, Estimated: 9 mL/min — ABNORMAL LOW (ref >60)
Glucose, Bld: 157 mg/dL — ABNORMAL HIGH (ref 70–99)
Potassium: 3.8 mmol/L (ref 3.5–5.1)
Sodium: 140 mmol/L (ref 135–145)
Total Bilirubin: 0.2 mg/dL — ABNORMAL LOW (ref 0.3–1.2)
Total Protein: 4.9 g/dL — ABNORMAL LOW (ref 6.5–8.1)

## 2021-08-10 LAB — CBC WITH DIFFERENTIAL/PLATELET
Abs Immature Granulocytes: 0 10*3/uL (ref 0.00–0.07)
Basophils Absolute: 0 10*3/uL (ref 0.0–0.1)
Basophils Relative: 0 %
Eosinophils Absolute: 0 10*3/uL (ref 0.0–0.5)
Eosinophils Relative: 0 %
HCT: 29.2 % — ABNORMAL LOW (ref 39.0–52.0)
Hemoglobin: 9.7 g/dL — ABNORMAL LOW (ref 13.0–17.0)
Lymphocytes Relative: 4 %
Lymphs Abs: 0.4 10*3/uL — ABNORMAL LOW (ref 0.7–4.0)
MCH: 29.2 pg (ref 26.0–34.0)
MCHC: 33.2 g/dL (ref 30.0–36.0)
MCV: 88 fL (ref 80.0–100.0)
Monocytes Absolute: 0.5 10*3/uL (ref 0.1–1.0)
Monocytes Relative: 5 %
Neutro Abs: 9.4 10*3/uL — ABNORMAL HIGH (ref 1.7–7.7)
Neutrophils Relative %: 91 %
Platelets: 222 10*3/uL (ref 150–400)
RBC: 3.32 MIL/uL — ABNORMAL LOW (ref 4.22–5.81)
RDW: 13.6 % (ref 11.5–15.5)
WBC: 10.3 10*3/uL (ref 4.0–10.5)
nRBC: 0 /100 WBC
nRBC: 0.4 % — ABNORMAL HIGH (ref 0.0–0.2)

## 2021-08-10 MED ORDER — MOLNUPIRAVIR EUA 200MG CAPSULE: 4.0000 | ORAL_CAPSULE | Freq: Two times a day (BID) | ORAL | 0 refills | Status: AC

## 2021-08-10 MED ORDER — ZINC SULFATE 220 (50 ZN) MG PO CAPS: 220.0000 mg | ORAL_CAPSULE | Freq: Every day | ORAL | 0 refills | Status: AC

## 2021-08-10 MED ORDER — ASCORBIC ACID 500 MG PO TABS: 500.0000 mg | ORAL_TABLET | Freq: Every day | ORAL | 0 refills | Status: AC

## 2021-08-10 NOTE — Progress Notes (Signed)
Occupational Therapy Treatment ?Patient Details ?Name: Paul Bond ?MRN: [id number] ?DOB: [birth year] ?Today's Date: 08/10/2021 ? ? ?History of present illness ALEISTER LADY is a 81 y.o. male with medical history significant of DM; HTN; CVA; CKD5 s/p remote L nephrectomy, and nephrolithiasis s/p stent placement multiple times presenting with worsening COVID infection.  He initially tested positive for COVID on 08/01/21.  He has had profound poor p.o. intake over the past week, with worsening weakness, general malaise, and ambulatory dysfunction.  He notes his wife at home is also sick but is currently staying home as her symptoms are not as severe. ?  ?OT comments ? Pt is making steady progress with OT at this time.  He is currently supervision level with completion of selfcare tasks sit to stand EOB.  He will have initial supervision today when he goes home, but will then be alone most of the time.  Feel with use of the rollator and continued HHOT for further eval and treatment in a familiar environment, he will be OK.  Will continue to follow in acute care if he does not discharge.  ? ?Recommendations for follow up therapy are one component of a multi-disciplinary discharge planning process, led by the attending physician.  Recommendations may be updated based on patient status, additional functional criteria and insurance authorization. ?   ?Follow Up Recommendations ? Home health OT  ?  ?Assistance Recommended at Discharge Intermittent Supervision/Assistance  ?Patient can return home with the following ? A [place] help with walking and/or transfers;A Sliney help with bathing/dressing/bathroom ?  ?Equipment Recommendations ? None recommended by OT  ?  ?   ?Precautions / Restrictions Precautions ?Precautions: Fall ?Precaution Comments: covid+ ?Restrictions ?Weight Bearing Restrictions: No  ? ? ?  ? ?Mobility Bed Mobility ?Overal bed mobility: Modified Independent ?Bed Mobility: Supine to Sit, Sit to Supine ?  ?  ?  ?   ?  ?  ?  ? ?Transfers ?Overall transfer level: Needs assistance ?Equipment used: Rollator (4 wheels) ?Transfers: Sit to/from Stand ?Sit to Stand: Supervision ?Stand pivot transfers: Supervision ?  ?Step pivot transfers: Supervision ?  ?  ?  ?  ?  ?Balance Overall balance assessment: Needs assistance, Mild deficits observed, not formally tested ?Sitting-balance support: Feet supported ?Sitting balance-[name] Scale: Normal ?  ?  ?Standing balance support: During functional activity ?Standing balance-[name] Scale: Fair ?  ?  ?  ?  ?  ?  ?  ?  ?  ?  ?  ?  ?   ? ?ADL either performed or assessed with clinical judgement  ? ?ADL Overall ADL's : Needs assistance/impaired ?  ?  ?Grooming: Dance movement psychotherapist;Wash/dry hands;Sitting;Set up ?  ?Upper Body Bathing: Set up;Sitting ?Upper Body Bathing Details (indicate cue type and reason): EOB ?Lower Body Bathing: Supervison/ safety;Sit to/from stand ?Lower Body Bathing Details (indicate cue type and reason): EOB ?Upper Body Dressing : Supervision/safety ?Upper Body Dressing Details (indicate cue type and reason): hospital gown ?  ?  ?Toilet Transfer: Supervision/safety;Rollator (4 wheels) ?  ?Toileting- Clothing Manipulation and Hygiene: Supervision/safety;Sit to/from stand ?  ?  ?  ?Functional mobility during ADLs: Rollator (4 wheels);Supervision/safety ?General ADL Comments: Pt reports having someone with him this pm when he goes home, but will likely be alone most of the time.  Instructed pt to continue using the rollator with min instructional cueing for locking the brakes prior to sitting and then reaching back to the surface.  Also had him utilize the RW for  mobility in the room at supervision level to retrieve clothing from his closet. ?  ? ? ? ?Cognition Arousal/Alertness: Awake/alert ?Behavior During Therapy: [facility] for tasks assessed/performed ?Overall Cognitive Status: Within Functional Limits for tasks assessed ?  ?  ?  ?  ?  ?  ?  ?  ?  ?  ?  ?  ?  ?  ?  ?  ?  ?  ?  ?   ?   ?    ?   ? ? ?Pertinent Vitals/ Pain       Pain Assessment ?Pain Assessment: No/denies pain ? ?   ?   ? ?Frequency ? Min 2X/week  ? ? ? ? ?  ?Progress Toward Goals ? ?OT Goals(current goals can now be found in the care plan section) ? Progress towards OT goals: Progressing toward goals ? ?Acute Rehab OT Goals ?OT Goal Formulation: With patient ?Potential to Achieve Goals: Good  ?Plan Discharge plan remains appropriate   ? ?   ?AM-PAC OT "6 Clicks" Daily Activity     ?Outcome Measure ? ? Help from another person eating meals?: None ?Help from another person taking care of personal grooming?: A Crupi ?Help from another person toileting, which includes using toliet, bedpan, or urinal?: A Much ?Help from another person bathing (including washing, rinsing, drying)?: A Sawhney ?Help from another person to put on and taking off regular upper body clothing?: A Brines ?Help from another person to put on and taking off regular lower body clothing?: A [place] ?6 Click Score: 19 ? ?  ?End of Session Equipment Utilized During Treatment: Rollator (4 wheels) ? ?OT Visit Diagnosis: Unsteadiness on feet (R26.81);Other abnormalities of gait and mobility (R26.89);Muscle weakness (generalized) (M62.81) ?  ?Activity Tolerance Patient tolerated treatment well ?  ?Patient Left in bed;with call bell/phone within reach ?  ?Nurse Communication Mobility status ?  ? ?   ? ?Time: 5784-6962 ?OT Time Calculation (min): 33 min ? ?Charges: OT General Charges ?$OT Visit: 1 Visit ?OT Treatments ?$Self Care/Home Management : 23-37 mins ? ? ?Zanyia Silbaugh OTR/L ? ?08/10/2021, 11:47 AM ?

## 2021-08-10 NOTE — Progress Notes (Signed)
Patient is being discharged home. Discharge instructions reviewed with patient including medications. Chronic foley catheter patient was admitted with is still intact. Patients daughter is his ride home.  ?
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
## 2021-08-10 NOTE — TOC Transition Note (Signed)
Transition of Care (TOC) - CM/SW Discharge Note ? ? ?Patient Details  ?Name: Paul Bond ?MRN: [id number] ?Date of Birth: May 16, 1941 ? ?Transition of Care (TOC) CM/SW Contact:  ?Tom-Johnson, Renea Ee, RN ?Phone Number: ?08/10/2021, 10:03 AM ? ? ?Clinical Narrative:    ? ?Patient is scheduled for discharge today. Last dose Lagevrio given today. Home health PT/OT with [facility] and info on AVS. Bedside commode and rollator delivered at bedside by [employer]. Daughter to transport at discharge. No further TOC needs noted. ? ?Final next level of care: [place] ?Barriers to Discharge: Barriers Resolved ? ? ?Patient Goals and CMS Choice ?Patient states their goals for this hospitalization and ongoing recovery are:: To return home ?CMS Medicare.gov Compare Post Acute Care list provided to:: Patient ?Choice offered to / list presented to : Patient ? ?Discharge Placement ?  ?           ?  ?  ?  ?  ? ?Discharge Plan and Services ?  ?Discharge Planning Services: CM Consult ?Post Acute Care Choice: Home Health          ?DME Arranged: 3-N-1, Walker rolling with seat ?DME Agency: [employer] ?Date DME Agency Contacted: 08/07/21 ?Time DME Agency Contacted: 1310 ?Representative spoke with at DME Agency: Freda Munro ?HH Arranged: PT, OT ?[place] Agency: [place] ?Date HH Agency Contacted: 08/07/21 ?Time [place]: 9702 ?Representative spoke with at [place]: Erline Levine ? ?Social Determinants of Health (SDOH) Interventions ?  ? ? ?Readmission Risk Interventions ?Readmission Risk Prevention Plan 08/07/2021  ?Transportation Screening Complete  ?PCP or Specialist Appt within 3-5 Days Complete  ?[place] or Home Care Consult Complete  ?Social Work Consult for [place] Planning/Counseling Complete  ?Palliative Care Screening Not Applicable  ?Medication Review Press photographer) Complete  ?Some recent data might be hidden  ? ? ? ? ? ?

## 2021-08-14 ENCOUNTER — Emergency Department (HOSPITAL_COMMUNITY): Payer: Medicare Other

## 2021-08-14 ENCOUNTER — Encounter (HOSPITAL_COMMUNITY): Payer: Self-pay | Admitting: Emergency Medicine

## 2021-08-14 ENCOUNTER — Inpatient Hospital Stay (HOSPITAL_COMMUNITY)
Admission: EM | Admit: 2021-08-14 | Discharge: 2021-08-19 | DRG: 698 | Disposition: A | Discharge status: Skilled Nursing Facility | Payer: Medicare Other | Attending: Internal Medicine | Admitting: Internal Medicine

## 2021-08-14 ENCOUNTER — Other Ambulatory Visit: Payer: Self-pay

## 2021-08-14 DIAGNOSIS — U071 COVID-19: Secondary | ICD-10-CM | POA: Diagnosis present

## 2021-08-14 DIAGNOSIS — Z87442 Personal history of urinary calculi: Secondary | ICD-10-CM

## 2021-08-14 DIAGNOSIS — D72829 Elevated white blood cell count, unspecified: Secondary | ICD-10-CM

## 2021-08-14 DIAGNOSIS — R404 Transient alteration of awareness: Secondary | ICD-10-CM

## 2021-08-14 DIAGNOSIS — R627 Adult failure to thrive: Secondary | ICD-10-CM | POA: Diagnosis present

## 2021-08-14 DIAGNOSIS — Z79899 Other long term (current) drug therapy: Secondary | ICD-10-CM

## 2021-08-14 DIAGNOSIS — Z8616 Personal history of COVID-19: Secondary | ICD-10-CM

## 2021-08-14 DIAGNOSIS — D649 Anemia, unspecified: Secondary | ICD-10-CM | POA: Diagnosis present

## 2021-08-14 DIAGNOSIS — E119 Type 2 diabetes mellitus without complications: Secondary | ICD-10-CM

## 2021-08-14 DIAGNOSIS — Z794 Long term (current) use of insulin: Secondary | ICD-10-CM

## 2021-08-14 DIAGNOSIS — I120 Hypertensive chronic kidney disease with stage 5 chronic kidney disease or end stage renal disease: Secondary | ICD-10-CM | POA: Diagnosis present

## 2021-08-14 DIAGNOSIS — N185 Chronic kidney disease, stage 5: Secondary | ICD-10-CM | POA: Diagnosis present

## 2021-08-14 DIAGNOSIS — E538 Deficiency of other specified B group vitamins: Secondary | ICD-10-CM | POA: Diagnosis present

## 2021-08-14 DIAGNOSIS — I10 Essential (primary) hypertension: Secondary | ICD-10-CM | POA: Diagnosis present

## 2021-08-14 DIAGNOSIS — E1121 Type 2 diabetes mellitus with diabetic nephropathy: Secondary | ICD-10-CM

## 2021-08-14 DIAGNOSIS — E1122 Type 2 diabetes mellitus with diabetic chronic kidney disease: Secondary | ICD-10-CM | POA: Diagnosis present

## 2021-08-14 DIAGNOSIS — R262 Difficulty in walking, not elsewhere classified: Secondary | ICD-10-CM | POA: Diagnosis present

## 2021-08-14 DIAGNOSIS — Z905 Acquired absence of kidney: Secondary | ICD-10-CM

## 2021-08-14 DIAGNOSIS — Z978 Presence of other specified devices: Secondary | ICD-10-CM

## 2021-08-14 DIAGNOSIS — D631 Anemia in chronic kidney disease: Secondary | ICD-10-CM | POA: Diagnosis present

## 2021-08-14 DIAGNOSIS — E43 Unspecified severe protein-calorie malnutrition: Secondary | ICD-10-CM | POA: Diagnosis present

## 2021-08-14 DIAGNOSIS — Z87891 Personal history of nicotine dependence: Secondary | ICD-10-CM

## 2021-08-14 DIAGNOSIS — Z8673 Personal history of transient ischemic attack (TIA), and cerebral infarction without residual deficits: Secondary | ICD-10-CM

## 2021-08-14 DIAGNOSIS — Z888 Allergy status to other drugs, medicaments and biological substances status: Secondary | ICD-10-CM

## 2021-08-14 DIAGNOSIS — T83518A Infection and inflammatory reaction due to other urinary catheter, initial encounter: Principal | ICD-10-CM | POA: Diagnosis present

## 2021-08-14 DIAGNOSIS — N136 Pyonephrosis: Secondary | ICD-10-CM | POA: Diagnosis present

## 2021-08-14 DIAGNOSIS — N390 Urinary tract infection, site not specified: Principal | ICD-10-CM

## 2021-08-14 LAB — CBC WITH DIFFERENTIAL/PLATELET

## 2021-08-14 LAB — URINALYSIS, ROUTINE W REFLEX MICROSCOPIC
Bilirubin Urine: NEGATIVE
Glucose, UA: NEGATIVE mg/dL
Ketones, ur: 5 mg/dL — AB
Nitrite: NEGATIVE
Protein, ur: >=300 mg/dL — AB
RBC / HPF: >50 RBC/hpf — ABNORMAL HIGH (ref 0–5)
Specific Gravity, Urine: 1.014 (ref 1.005–1.030)
WBC, UA: >50 WBC/hpf — ABNORMAL HIGH (ref 0–5)
pH: 9 — ABNORMAL HIGH (ref 5.0–8.0)

## 2021-08-14 NOTE — ED Triage Notes (Signed)
Patient arrived with EMS from home tested positive for Covid last week reports generalized weakness/fatigue and anorexia . Denies fever or chills /respirations unlabored.  ?

## 2021-08-14 NOTE — ED Provider Triage Note (Signed)
Emergency Medicine Provider Triage Evaluation Note ? ?Paul Bond , a 81 y.o. male  was evaluated in triage.  Pt complains of continued generalized weakness secondary to COVID-19 infection.  Patient was admitted to the hospital last week and discharged home on the second.  He states that at home he has been feeling more and more fatigued.  He states that he has not been eating or drinking much as he has not felt like it.  He denies any specific shortness of breath denies fever.  He denies any nausea, vomiting, diarrhea, abdominal pain, chest pain.  He admits that his wife is in rehab currently and he is interested in same.  Does appear he had PT/OT and home health ordered at discharge and states that they have been out however he does not feel like they are doing much help. ? ?Review of Systems  ?Positive: Weakness, fatigue, loss of appetite ?Negative: - SOB ? ?Physical Exam  ?BP (!) 131/98 (BP Location: Right Arm)   Pulse (!) 102   Temp 99.1 ?F (37.3 ?C) (Oral)   Resp 20   Ht '5\' 7"'$  (1.702 m)   Wt 81 kg   SpO2 99%   BMI 27.97 kg/m?  ?Gen:   Awake, no distress   ?Resp:  Normal effort  ?MSK:   Moves extremities without difficulty  ?Other:   ? ?Medical Decision Making  ?Medically screening exam initiated at 10:43 PM.  Appropriate orders placed.  Paul Bond was informed that the remainder of the evaluation will be completed by another provider, this initial triage assessment does not replace that evaluation, and the importance of remaining in the ED until their evaluation is complete. ? ? ?  ?Eustaquio Maize, PA-C ?08/14/21 2246 ? ?

## 2021-08-15 ENCOUNTER — Encounter (HOSPITAL_COMMUNITY): Payer: Self-pay | Admitting: Family Medicine

## 2021-08-15 DIAGNOSIS — R627 Adult failure to thrive: Secondary | ICD-10-CM

## 2021-08-15 DIAGNOSIS — N400 Enlarged prostate without lower urinary tract symptoms: Secondary | ICD-10-CM | POA: Insufficient documentation

## 2021-08-15 DIAGNOSIS — D72829 Elevated white blood cell count, unspecified: Secondary | ICD-10-CM

## 2021-08-15 DIAGNOSIS — E785 Hyperlipidemia, unspecified: Secondary | ICD-10-CM | POA: Insufficient documentation

## 2021-08-15 DIAGNOSIS — N401 Enlarged prostate with lower urinary tract symptoms: Secondary | ICD-10-CM | POA: Insufficient documentation

## 2021-08-15 DIAGNOSIS — Z978 Presence of other specified devices: Secondary | ICD-10-CM

## 2021-08-15 LAB — CBC
HCT: 32.1 % — ABNORMAL LOW (ref 39.0–52.0)
Hemoglobin: 10 g/dL — ABNORMAL LOW (ref 13.0–17.0)
MCH: 29.1 pg (ref 26.0–34.0)
MCHC: 31.2 g/dL (ref 30.0–36.0)
MCV: 93.3 fL (ref 80.0–100.0)
Platelets: 247 10*3/uL (ref 150–400)
RBC: 3.44 MIL/uL — ABNORMAL LOW (ref 4.22–5.81)
RDW: 14 % (ref 11.5–15.5)
WBC: 27.7 10*3/uL — ABNORMAL HIGH (ref 4.0–10.5)
nRBC: 0 % (ref 0.0–0.2)

## 2021-08-15 LAB — CREATININE, SERUM
Creatinine, Ser: 5.93 mg/dL — ABNORMAL HIGH (ref 0.61–1.24)
GFR, Estimated: 9 mL/min — ABNORMAL LOW (ref >60)

## 2021-08-15 LAB — CBC WITH DIFFERENTIAL/PLATELET
Abs Immature Granulocytes: 1 10*3/uL — ABNORMAL HIGH (ref 0.00–0.07)
Basophils Absolute: 0 10*3/uL (ref 0.0–0.1)
Basophils Relative: 0 %
Eosinophils Absolute: 0 10*3/uL (ref 0.0–0.5)
Eosinophils Relative: 0 %
HCT: 32.5 % — ABNORMAL LOW (ref 39.0–52.0)
Hemoglobin: 10.2 g/dL — ABNORMAL LOW (ref 13.0–17.0)
Lymphocytes Relative: 2 %
Lymphs Abs: 0.5 10*3/uL — ABNORMAL LOW (ref 0.7–4.0)
MCH: 29.1 pg (ref 26.0–34.0)
MCHC: 31.4 g/dL (ref 30.0–36.0)
MCV: 92.6 fL (ref 80.0–100.0)
Monocytes Absolute: 0.5 10*3/uL (ref 0.1–1.0)
Monocytes Relative: 2 %
Myelocytes: 3 %
Neutro Abs: 22.4 10*3/uL — ABNORMAL HIGH (ref 1.7–7.7)
Neutrophils Relative %: 92 %
Platelets: 260 10*3/uL (ref 150–400)
Promyelocytes Relative: 1 %
RBC: 3.51 MIL/uL — ABNORMAL LOW (ref 4.22–5.81)
RDW: 13.9 % (ref 11.5–15.5)
WBC: 24.3 10*3/uL — ABNORMAL HIGH (ref 4.0–10.5)
nRBC: 0 % (ref 0.0–0.2)
nRBC: 0 /100 WBC

## 2021-08-15 LAB — CBG MONITORING, ED
Glucose-Capillary: 163 mg/dL — ABNORMAL HIGH (ref 70–99)
Glucose-Capillary: 156 mg/dL — ABNORMAL HIGH (ref 70–99)
Glucose-Capillary: 119 mg/dL — ABNORMAL HIGH (ref 70–99)

## 2021-08-15 LAB — VITAMIN B12: Vitamin B-12: 5111 pg/mL — ABNORMAL HIGH (ref 180–914)

## 2021-08-15 LAB — COMPREHENSIVE METABOLIC PANEL
ALT: 20 U/L (ref 0–44)
AST: 13 U/L — ABNORMAL LOW (ref 15–41)
Albumin: 2.2 g/dL — ABNORMAL LOW (ref 3.5–5.0)
Alkaline Phosphatase: 60 U/L (ref 38–126)
Anion gap: 12 (ref 5–15)
BUN: 66 mg/dL — ABNORMAL HIGH (ref 8–23)
CO2: 21 mmol/L — ABNORMAL LOW (ref 22–32)
Calcium: 8.3 mg/dL — ABNORMAL LOW (ref 8.9–10.3)
Chloride: 110 mmol/L (ref 98–111)
Creatinine, Ser: 5.37 mg/dL — ABNORMAL HIGH (ref 0.61–1.24)
GFR, Estimated: 10 mL/min — ABNORMAL LOW (ref >60)
Glucose, Bld: 191 mg/dL — ABNORMAL HIGH (ref 70–99)
Potassium: 4.4 mmol/L (ref 3.5–5.1)
Sodium: 143 mmol/L (ref 135–145)
Total Bilirubin: 0.6 mg/dL (ref 0.3–1.2)
Total Protein: 6.4 g/dL — ABNORMAL LOW (ref 6.5–8.1)

## 2021-08-15 LAB — HEMOGLOBIN A1C
Hgb A1c MFr Bld: 7.9 % — ABNORMAL HIGH (ref 4.8–5.6)
Mean Plasma Glucose: 180.03 mg/dL

## 2021-08-15 LAB — TSH: TSH: 1.508 u[IU]/mL (ref 0.350–4.500)

## 2021-08-15 LAB — LACTIC ACID, PLASMA
Lactic Acid, Venous: 1.1 mmol/L (ref 0.5–1.9)
Lactic Acid, Venous: 1 mmol/L (ref 0.5–1.9)

## 2021-08-15 LAB — GLUCOSE, CAPILLARY: Glucose-Capillary: 125 mg/dL — ABNORMAL HIGH (ref 70–99)

## 2021-08-15 LAB — PROCALCITONIN: Procalcitonin: 0.67 ng/mL

## 2021-08-15 LAB — VITAMIN D 25 HYDROXY (VIT D DEFICIENCY, FRACTURES): Vit D, 25-Hydroxy: 15.56 ng/mL — ABNORMAL LOW (ref 30–100)

## 2021-08-15 MED ORDER — VITAMIN D 25 MCG (1000 UNIT) PO TABS
2000.0000 [IU] | ORAL_TABLET | Freq: Every day | ORAL | Status: DC
  Administered 2021-08-15 – 2021-08-19 (×5): 2000 [IU] via ORAL
  Filled 2021-08-15 (×5): qty 2

## 2021-08-15 MED ORDER — SODIUM CHLORIDE 0.9 % IV BOLUS
500.0000 mL | Freq: Once | INTRAVENOUS | Status: AC
  Administered 2021-08-15: 500 mL via INTRAVENOUS

## 2021-08-15 MED ORDER — HEPARIN SODIUM (PORCINE) 5000 UNIT/ML IJ SOLN
5000.0000 [IU] | Freq: Three times a day (TID) | INTRAMUSCULAR | Status: DC
  Administered 2021-08-15 – 2021-08-19 (×13): 5000 [IU] via SUBCUTANEOUS
  Filled 2021-08-15 (×13): qty 1

## 2021-08-15 MED ORDER — ACETAMINOPHEN 325 MG PO TABS
650.0000 mg | ORAL_TABLET | Freq: Four times a day (QID) | ORAL | Status: DC | PRN
  Filled 2021-08-15: qty 2

## 2021-08-15 MED ORDER — LATANOPROST 0.005 % OP SOLN
1.0000 [drp] | Freq: Every evening | OPHTHALMIC | Status: DC
  Administered 2021-08-16 – 2021-08-18 (×3): 1 [drp] via OPHTHALMIC
  Filled 2021-08-15: qty 2.5

## 2021-08-15 MED ORDER — DILTIAZEM HCL ER COATED BEADS 240 MG PO CP24
240.0000 mg | ORAL_CAPSULE | Freq: Every day | ORAL | Status: DC
  Administered 2021-08-15 – 2021-08-19 (×5): 240 mg via ORAL
  Filled 2021-08-15 (×5): qty 1

## 2021-08-15 MED ORDER — HYDRALAZINE HCL 25 MG PO TABS
25.0000 mg | ORAL_TABLET | Freq: Three times a day (TID) | ORAL | Status: DC
  Administered 2021-08-15 – 2021-08-19 (×12): 25 mg via ORAL
  Filled 2021-08-15 (×12): qty 1

## 2021-08-15 MED ORDER — INSULIN ASPART 100 UNIT/ML IJ SOLN
0.0000 [IU] | Freq: Three times a day (TID) | INTRAMUSCULAR | Status: DC
  Administered 2021-08-16 (×2): 1 [IU] via SUBCUTANEOUS
  Administered 2021-08-17: 17:00:00 2 [IU] via SUBCUTANEOUS
  Administered 2021-08-17 – 2021-08-18 (×2): 1 [IU] via SUBCUTANEOUS

## 2021-08-15 MED ORDER — SODIUM CHLORIDE 0.9% FLUSH
3.0000 mL | Freq: Two times a day (BID) | INTRAVENOUS | Status: DC
  Administered 2021-08-15 – 2021-08-19 (×9): 3 mL via INTRAVENOUS

## 2021-08-15 MED ORDER — SODIUM CHLORIDE 0.9 % IV SOLN
1.0000 g | Freq: Once | INTRAVENOUS | Status: AC
  Administered 2021-08-15: 1 g via INTRAVENOUS
  Filled 2021-08-15: qty 10

## 2021-08-15 MED ORDER — SODIUM CHLORIDE 0.9 % IV SOLN
1.0000 g | INTRAVENOUS | Status: DC
  Administered 2021-08-16 – 2021-08-18 (×3): 1 g via INTRAVENOUS
  Filled 2021-08-15 (×3): qty 10

## 2021-08-15 MED ORDER — INSULIN GLARGINE-YFGN 100 UNIT/ML ~~LOC~~ SOLN
8.0000 [IU] | Freq: Every day | SUBCUTANEOUS | Status: DC
  Administered 2021-08-15 – 2021-08-19 (×5): 8 [IU] via SUBCUTANEOUS
  Filled 2021-08-15 (×5): qty 0.08

## 2021-08-15 MED ORDER — SODIUM CHLORIDE 0.9% FLUSH: 3.0000 mL | INTRAVENOUS | Status: DC | PRN

## 2021-08-15 MED ORDER — ACETAMINOPHEN 650 MG RE SUPP: 650.0000 mg | Freq: Four times a day (QID) | RECTAL | Status: DC | PRN

## 2021-08-15 MED ORDER — DILTIAZEM HCL ER BEADS 240 MG PO CP24
240.0000 mg | ORAL_CAPSULE | Freq: Every day | ORAL | Status: DC
  Filled 2021-08-15: qty 1

## 2021-08-15 MED ORDER — SODIUM CHLORIDE 0.9 % IV SOLN: 250.0000 mL | INTRAVENOUS | Status: DC | PRN

## 2021-08-15 MED ORDER — SODIUM BICARBONATE 650 MG PO TABS
1300.0000 mg | ORAL_TABLET | Freq: Two times a day (BID) | ORAL | Status: DC
  Administered 2021-08-15 – 2021-08-19 (×9): 1300 mg via ORAL
  Filled 2021-08-15 (×10): qty 2

## 2021-08-15 NOTE — Assessment & Plan Note (Signed)
Positive test on 2/21 with hospitalization from 2/25-3/2 for fatigue/ambulatory dysfunctin ?-completed 3 day course of decadron and 5 day course of molnupiravir ?-<21 days of infection, continue airborne and contact precautions even though 10 should be sufficient  ?-CXR with no acute findings and no respiratory complaints ?-fatigue could be contributed to covid vs. Multifactorial with stage V CKD, age, debility, protein calorie malnutrition playing a role  ?

## 2021-08-15 NOTE — ED Notes (Signed)
Admitting provider at bedside.

## 2021-08-15 NOTE — Assessment & Plan Note (Signed)
Continue cardizem and hydralazine  ?

## 2021-08-15 NOTE — ED Notes (Signed)
Pt repositioned in bed. Pt given applesauce and water.  ?

## 2021-08-15 NOTE — Assessment & Plan Note (Signed)
Has had since 2021, daughter and patient unsure why this was placed ?Foley care  ?

## 2021-08-15 NOTE — ED Notes (Signed)
Patient cleaned up and brief and disposable pants put on patient. Patient incont of stool. Foley bag emptied. ?

## 2021-08-15 NOTE — ED Provider Notes (Signed)
[facility] EMERGENCY DEPARTMENT Provider Note   CSN: [id number] Arrival date & time: 08/14/21  2236     History  Chief Complaint  Patient presents with   Covid+ Paul Bond    Paul Bond is a 81 y.o. male.  HPI Patient presents from home with his daughter who looks after him, with report that "he is not himself."  He is less able to do things around the home and seem to be worsening since discharge from the hospital, 5 days ago.  He had been admitted for COVID infection, and managed for complications including chronic renal insufficiency, diabetes and hypertension.  He has had occupational health assessment in the home but apparently no home health nurse saw him.    Home Medications Prior to Admission medications   Medication Sig Start Date End Date Taking? Authorizing Provider  ascorbic acid (VITAMIN C) 500 MG tablet Take 1 tablet (500 mg total) by mouth daily. 08/11/21 09/10/21  Alma Friendly, MD  Cholecalciferol (VITAMIN D3) 50 MCG (2000 UT) TABS Take 2,000 Units by mouth daily.    [provider]  diltiazem (CARDIZEM CD) 180 MG 24 hr capsule Take 1 capsule (180 mg total) by mouth daily. Patient not taking: Reported on 08/06/2021 02/21/20   Antonieta Pert, MD  glucose 4 GM chewable tablet Chew 4 tablets by mouth See admin instructions. Chew 4 tablets (16 g) by mouth as needed for low blood sugar, repeat every 15 minutes if blood sugar is less than 70    [provider]  hydrALAZINE (APRESOLINE) 25 MG tablet Take 25 mg by mouth 3 (three) times daily.    [provider]  insulin aspart (NOVOLOG) 100 UNIT/ML FlexPen Inject 5 Units into the skin every morning. Every morning before a meal    [provider]  insulin glargine-yfgn (SEMGLEE) 100 UNIT/ML injection Inject 8 Units into the skin daily.    [provider]  latanoprost (XALATAN) 0.005 % ophthalmic solution Place 1 drop into both eyes every evening.    [provider]  Multiple Vitamin (MULTIVITAMIN WITH MINERALS) TABS tablet Take 1 tablet by mouth daily. 02/22/20   Antonieta Pert, MD  sevelamer carbonate (RENVELA) 800 MG tablet Take 1 tablet (800 mg total) by mouth 3 (three) times daily with meals. 02/21/20   Antonieta Pert, MD  sodium bicarbonate 650 MG tablet Take 1 tablet (650 mg total) by mouth 2 (two) times daily. Patient taking differently: Take 1,300 mg by mouth 2 (two) times daily. 02/21/20   Antonieta Pert, MD  zinc sulfate 220 (50 Zn) MG capsule Take 1 capsule (220 mg total) by mouth daily. 08/11/21 09/10/21  Alma Friendly, MD      Allergies    Lisinopril and Semaglutide    Review of Systems   Review of Systems  Physical Exam Updated Vital Signs BP (!) 153/62    Pulse 63    Temp 97.9 F (36.6 C) (Rectal)    Resp 15    Ht '5\' 7"'$  (1.702 m)    Wt 81 kg    SpO2 99%    BMI 27.97 kg/m  Physical Exam Vitals and nursing note reviewed.  Constitutional:      Appearance: He is well-developed. He is not ill-appearing.     Comments: Elderly, frail  HENT:     Head: Normocephalic and atraumatic.     Right Ear: External ear normal.     Left Ear: External ear normal.  Nose: No congestion or rhinorrhea.  Eyes:     Conjunctiva/sclera: Conjunctivae normal.     Pupils: Pupils are equal, round, and reactive to light.  Neck:     Trachea: Phonation normal.  Cardiovascular:     Rate and Rhythm: Normal rate and regular rhythm.     Heart sounds: Normal heart sounds.  Pulmonary:     Effort: Pulmonary effort is normal.     Breath sounds: Normal breath sounds.  Abdominal:     General: There is no distension.     Palpations: Abdomen is soft.     Tenderness: There is no abdominal tenderness.  Genitourinary:    Comments: Urinary catheter draining clear appearing orange-colored urine. Musculoskeletal:        General: Normal range of motion.     Cervical back: Normal range of motion and neck supple.  Skin:    General: Skin is warm and dry.   Neurological:     Mental Status: He is alert.     Cranial Nerves: No cranial nerve deficit.     Sensory: No sensory deficit.     Motor: No abnormal muscle tone.     Coordination: Coordination normal.  Psychiatric:        Mood and Affect: Mood normal.        Behavior: Behavior normal.    ED Results / Procedures / Treatments   Labs (all labs ordered are listed, but only abnormal results are displayed) Labs Reviewed  COMPREHENSIVE METABOLIC PANEL - Abnormal; Notable for the following components:      Result Value   CO2 21 (*)    Glucose, Bld 191 (*)    BUN 66 (*)    Creatinine, Ser 5.37 (*)    Calcium 8.3 (*)    Total Protein 6.4 (*)    Albumin 2.2 (*)    AST 13 (*)    GFR, Estimated 10 (*)    All other components within normal limits  CBC WITH DIFFERENTIAL/PLATELET - Abnormal; Notable for the following components:   WBC 24.3 (*)    RBC 3.51 (*)    Hemoglobin 10.2 (*)    HCT 32.5 (*)    Neutro Abs 22.4 (*)    Lymphs Abs 0.5 (*)    Abs Immature Granulocytes 1.00 (*)    All other components within normal limits  URINALYSIS, ROUTINE W REFLEX MICROSCOPIC - Abnormal; Notable for the following components:   APPearance TURBID (*)    pH 9.0 (*)    Hgb urine dipstick SMALL (*)    Ketones, ur 5 (*)    Protein, ur >=300 (*)    Leukocytes,Ua LARGE (*)    RBC / HPF >50 (*)    WBC, UA >50 (*)    Bacteria, UA MANY (*)    All other components within normal limits  CULTURE, BLOOD (ROUTINE X 2)  CULTURE, BLOOD (ROUTINE X 2)  PROCALCITONIN  LACTIC ACID, PLASMA  LACTIC ACID, PLASMA    EKG EKG Interpretation  Date/Time:  Tuesday August 15 2021 08:41:53 EST Ventricular Rate:  114 PR Interval:    QRS Duration: 125 QT Interval:  380 QTC Calculation: 524 R Axis:   -78 Text Interpretation: Junctional tachycardia RBBB and LAFB Since last tracing Right bundle branch block and junctional tachycardia are new Otherwise no significant change Confirmed by Daleen Bo [phone]) on  08/15/2021 9:04:26 AM  Radiology DG Chest 2 View  Result Date: 08/14/2021 CLINICAL DATA:  COVID infection with generalized weakness EXAM: CHEST -  2 VIEW COMPARISON:  08/05/2021 FINDINGS: Cardiac shadow is within normal limits. Aortic calcifications are seen. The lungs are clear bilateral with the exception of minimal left basilar atelectasis. No sizable effusion is noted. No bony abnormality is seen. IMPRESSION: Minimal left basilar atelectasis. Electronically Signed   By: Inez Catalina M.D.   On: 08/14/2021 23:19    Procedures Procedures    Medications Ordered in ED Medications  cefTRIAXone (ROCEPHIN) 1 g in sodium chloride 0.9 % 100 mL IVPB (has no administration in time range)  sodium chloride 0.9 % bolus 500 mL (0 mLs Intravenous Stopped 08/15/21 1033)    ED Course/ Medical Decision Making/ A&P                           Medical Decision Making Patient presenting with malaise, not improving since discharge from the hospital after treated for COVID infection.  He has a chronic Foley catheter.  Daughter reports mental status changes, preventing him from being able to manage himself at home as usual.  He lives alone currently.  His wife is currently in a rehab facility.  Problems Addressed: Transient alteration of awareness: acute illness or injury    Details: Mental status changes prevent him from caring for himself at home. Urinary tract infection without hematuria, site unspecified: acute illness or injury with systemic symptoms that poses a threat to life or bodily functions    Details: Multiple UTI with elevated white count.  Urine culture ordered, IV antibiotics started in the ED.  Amount and/or Complexity of Data Reviewed Independent Historian: caregiver    Details: Daughter at bedside gives history External Data Reviewed: labs, radiology and notes.    Details: Recent hospitalization for COVID infection, follow-up for chronic renal insufficiency, status post nephrectomy.  COVID  infection was diagnosed 2 and half weeks ago. Labs: ordered.    Details: CBC, metabolic panel, urinalysis, blood cultures, urine culture, lactate, procalcitonin: Normal except glucose high, BUN high, creatinine high, calcium low, total protein low, albumin low, white count high, hemoglobin low, urinalysis abnormal. Radiology: ordered and independent interpretation performed.    Details: Chest x-ray no infiltrate or edema ECG/medicine tests: ordered and independent interpretation performed.    Details: Cardiac monitor, sinus tachycardia Discussion of management or test interpretation with external provider(s): Case discussed with hospitalist.  Patient has elevated white count, abnormal urine, and possible left lung pneumonia.  She will admit the patient.  Risk Prescription drug management. Decision regarding hospitalization. Risk Details: Patient being evaluated for post COVID altered mental status and weakness, less able to perform usual activities of daily living.  He has a chronic Foley catheter.  He was discharged from the hospital 5 days ago to his domicile where he lives alone with his daughter looking on him from time to time.  He has had limited home health services, since being discharged from the hospital 5 days ago.  Patient has concerning signs for urinary tract infection with elevated white count.  He will require hospitalization.  Urine culture, Rocephin ordered.           Final Clinical Impression(s) / ED Diagnoses Final diagnoses:  None    Rx / DC Orders ED Discharge Orders     None         Daleen Bo, MD 08/15/21 1910

## 2021-08-15 NOTE — ED Notes (Signed)
Unsuccessful attempts at PIV/lab collection.  ?

## 2021-08-15 NOTE — Assessment & Plan Note (Signed)
At baseline and plans for HD through [place] ?I would think this could be a big contributing factor to his fatigue and other issues as well as covid/protein calorie malnutrition/age and debility  ?Avoid nephrotoxic drugs  ?Strict I/O ?trend ?

## 2021-08-15 NOTE — Assessment & Plan Note (Addendum)
81 year old male with nearly 4 week history of progressive decline including severely decreased PO intake, weakness/fatigue/malaise, inability to perform ADLs and mild weight loss ?-place in obs on telemetry ?-multifactorial causes for FTT including CKD stage V, recent covid infection, age, debility, wife going to rehab and him at home alone, ? Infection  ?-SW consult as family does not think he is safe at  Home alone and needs rehab ?-PT/OT consult ?-nutrition consult  ?-w/u to include TSH, B12, A1c and vitamin D (hx of b12 deficiency in 1/23)  ?-work up for infection/WBC. [facility] planning [facility], he is not sure if he is going to do this yet. Discussed with daughter if he decides against dialysis then [place] would be more palliative.  ?

## 2021-08-15 NOTE — Assessment & Plan Note (Addendum)
A1c of 7.9 from 8/21. Will check again with fatigue/FTT complaints  ?Continue 8 units long acting insulin and SSI with accuchecks  ?

## 2021-08-15 NOTE — ED Notes (Signed)
Pt daughter went home. Please call when pt is ready to be D/C. Huey Romans. ?

## 2021-08-15 NOTE — Assessment & Plan Note (Signed)
FTT and protein calorie malnutrition ?Nutrition consult  ?

## 2021-08-15 NOTE — Assessment & Plan Note (Signed)
Baseline of 10-11, stable. Continue to monitor  ?

## 2021-08-15 NOTE — Assessment & Plan Note (Addendum)
He has significant leukocytosis with left shift.  ?Lactic acid wnl, procalcitonin at .67 ?No other criteria for SIRS ?His urine appears quite dirty, but from foley and chronically appears this way. No CVA tenderness. Urine cx pending, start rocephin  ?Could be reactive, but Will treat with rocephin, trend pct and follow clinically ? ?

## 2021-08-15 NOTE — H&P (Signed)
History and Physical    Patient: Paul Bond SWF:[id number] DOB: Aug 08, 1940 DOA: 08/14/2021 DOS: the patient was seen and examined on 08/15/2021 [facility]  Patient coming from: Home - lives with his wife, but currently alone as his wife is in a rehab. Uses walker/cane occasionally.    Chief Complaint: fatigue/"not himself"   HPI: Paul Bond is a 81 y.o. 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 He feels tired and weak and has  had poor PO intake. His daughter confirms this. She states he is still not eating well and is just weak and tired since hospital discharge. She doesn't think  he is safe to stay at home and needs placement.   Hospitalized on 08/05/21-08/10/21 for poor PO intake, weakness and malaise and ambulatory dysfunction due to covid infection. He intially tested positive on 2/21.  Given 5 day course of molnupiravir and decadron in hospital x 3 days.   No fever/chills, no headaches/dizziness/no chest pain or palpitations, no shortness of breath or cough, no stomach pain, N/V/D, no leg swelling.  Chronic catheter. No CVA tenderness.   His niece cooks his meals for him.    ER Course:  vitals: afebrile, bp: 131/98, HR: 102, RR: 20, oxygen: 99%RA Pertinent labs: wbc: 24.3, hgb: 10.2 (10-11), pct: .67, lactic acid wnl x 2, BC pending, BUN: 66, creatinine: 5.37,  CxR: no acute findings, minimal left basilar atelectasis  In ED given 500cc bolus.    Review of Systems: As mentioned in the history of present illness. All other systems reviewed and are negative. Past Medical History:  Diagnosis Date    Cancer ([place])    Diabetes mellitus without complication ([place])    Hypertension    Stroke [facility])    Past Surgical History:  Procedure Laterality Date   BIOPSY  02/18/2020   Procedure: BIOPSY;  Surgeon: [name], MD;  Location: [facility] ENDOSCOPY;  Service: Gastroenterology;;   COLON SURGERY     COLONOSCOPY  02/18/2020   COLONOSCOPY WITH PROPOFOL N/A 02/18/2020   Procedure: COLONOSCOPY WITH PROPOFOL;  Surgeon: [name], MD;  Location: [place];  Service: Gastroenterology;  Laterality: N/A;   CYSTOSCOPY W/ URETERAL STENT PLACEMENT Right 02/03/2020   Procedure: CYSTOSCOPY WITH RETROGRADE PYELOGRAM/URETERAL DOUBLE J STENT PLACEMENT;  Surgeon: [name], MD;  Location: [place];  Service: Urology;  Laterality: Right;   ESOPHAGOGASTRODUODENOSCOPY (EGD) WITH PROPOFOL N/A 02/18/2020   Procedure: ESOPHAGOGASTRODUODENOSCOPY (EGD) WITH PROPOFOL;  Surgeon: [name], MD;  Location: [place];  Service: Gastroenterology;  Laterality: N/A;   POLYPECTOMY  02/18/2020   Procedure: POLYPECTOMY;  Surgeon: [name], MD;  Location: [facility] ENDOSCOPY;  Service: Gastroenterology;;   Social History:  reports that he has quit smoking. He has never used smokeless tobacco. He reports that he does not drink alcohol and does not use drugs.  Allergies  Allergen Reactions   Lisinopril Cough   Semaglutide Other (See Comments)    indigestion    No family history on file.  Prior to Admission medications   Medication Sig Start Date End Date Taking? Authorizing Provider  ascorbic acid (VITAMIN C) 500 MG  tablet Take 1 tablet (500 mg total) by mouth daily. 08/11/21 09/10/21  Alma Friendly, MD  Cholecalciferol (VITAMIN D3) 50 MCG (2000 UT) TABS Take 2,000 Units by mouth daily.    [provider]  diltiazem (CARDIZEM CD) 180 MG 24 hr capsule Take 1 capsule (180 mg total) by mouth daily. Patient not taking: Reported on 08/06/2021 02/21/20   Antonieta Pert, MD  glucose 4 GM  chewable tablet Chew 4 tablets by mouth See admin instructions. Chew 4 tablets (16 g) by mouth as needed for low blood sugar, repeat every 15 minutes if blood sugar is less than 70    [provider]  hydrALAZINE (APRESOLINE) 25 MG tablet Take 25 mg by mouth 3 (three) times daily.    [provider]  insulin aspart (NOVOLOG) 100 UNIT/ML FlexPen Inject 5 Units into the skin every morning. Every morning before a meal    [provider]  insulin glargine-yfgn (SEMGLEE) 100 UNIT/ML injection Inject 8 Units into the skin daily.    [provider]  latanoprost (XALATAN) 0.005 % ophthalmic solution Place 1 drop into both eyes every evening.    [provider]  Multiple Vitamin (MULTIVITAMIN WITH MINERALS) TABS tablet Take 1 tablet by mouth daily. 02/22/20   Antonieta Pert, MD  sevelamer carbonate (RENVELA) 800 MG tablet Take 1 tablet (800 mg total) by mouth 3 (three) times daily with meals. 02/21/20   Antonieta Pert, MD  sodium bicarbonate 650 MG tablet Take 1 tablet (650 mg total) by mouth 2 (two) times daily. Patient taking differently: Take 1,300 mg by mouth 2 (two) times daily. 02/21/20   Antonieta Pert, MD  zinc sulfate 220 (50 Zn) MG capsule Take 1 capsule (220 mg total) by mouth daily. 08/11/21 09/10/21  Alma Friendly, MD    Physical Exam: Vitals:   08/15/21 0850 08/15/21 1000 08/15/21 1100 08/15/21 1200  BP: (!) 166/70 (!) 166/67 (!) 153/61 (!) 153/62  Pulse: 75 71 66 63  Resp: '16 17 14 15  '$ Temp: 97.9 F (36.6 C)     TempSrc: Rectal     SpO2: 98% 98% 99% 99%  Weight:      Height:       General:  Appears calm and comfortable and is in NAD. Resting comfortably.  Eyes:  PERRL, EOMI, normal lids, iris ENT:  grossly normal hearing, lips & tongue, mmm; appropriate dentition Neck:  no LAD, masses or thyromegaly; no carotid bruits Cardiovascular:  RRR, no m/r/g. Trace BLE edema  Respiratory:   CTA bilaterally with no wheezes/rales/rhonchi.  Normal respiratory  effort. Abdomen:  soft, NT, ND, NABS Back:   normal alignment, no CVAT Skin:  no rash or induration seen on limited exam Musculoskeletal:  grossly normal tone BUE/BLE, good ROM, no bony abnormality Lower extremity: Limited foot exam with no ulcerations.  2+ distal pulses. Psychiatric:  grossly normal mood and affect, speech fluent and appropriate, AOx3 Neurologic:  CN 2-12 grossly intact, moves all extremities in coordinated fashion, sensation intact   Radiological Exams on Admission: Independently reviewed - see discussion in A/P where applicable  DG Chest 2 View  Result Date: 08/14/2021 CLINICAL DATA:  COVID infection with generalized weakness EXAM: CHEST - 2 VIEW COMPARISON:  08/05/2021 FINDINGS: Cardiac shadow is within normal limits. Aortic calcifications are seen. The lungs are clear bilateral with the exception of minimal left basilar atelectasis. No sizable effusion is noted. No bony abnormality is seen. IMPRESSION: Minimal left basilar atelectasis. Electronically Signed   By:  Inez Catalina M.D.   On: 08/14/2021 23:19    EKG: Independently reviewed.  Junctional tachycardia with rate 114; nonspecific ST changes with no evidence of acute ischemia. New RBBB and junctional rhythm.    Labs on Admission: I have personally reviewed the available labs and imaging studies at the time of the admission.  Pertinent labs:   wbc: 24.3,  hgb: 10.2 (10-11),  pct: .67,  lactic acid wnl x 2,  BC pending,  BUN: 66,  creatinine: 5.37,    Assessment and Plan: * Failure to thrive in adult 81 year old male with nearly 4 week history of progressive decline including severely decreased PO intake, weakness/fatigue/malaise, inability to perform ADLs and mild weight loss -place in obs on telemetry -multifactorial causes for FTT including CKD stage V, recent covid infection, age, debility, wife going to rehab and him at home alone, ? Infection  -SW consult as family does not think he is safe at  Home  alone and needs rehab -PT/OT consult -nutrition consult  -w/u to include TSH, B12, A1c and vitamin D (hx of b12 deficiency in 1/23)  -work up for infection/WBC. [facility] planning for [facility], he is not sure if he is going to do this yet. Discussed with daughter if he decides against dialysis then [place] would be more palliative.   Leukocytosis He has significant leukocytosis with left shift.  Lactic acid wnl, procalcitonin at .67 No other criteria for SIRS His urine appears quite dirty, but from foley and chronically appears this way. No CVA tenderness. Urine cx pending, start rocephin  Could be reactive, but Will treat with rocephin, trend pct and follow clinically   COVID-19 virus infection Positive test on 2/21 with hospitalization from 2/25-3/2 for fatigue/ambulatory dysfunctin -completed 3 day course of decadron and 5 day course of molnupiravir -<21 days of infection, continue airborne and contact precautions even though 10 should be sufficient  -CXR with no acute findings and no respiratory complaints -fatigue could be contributed to covid vs. Multifactorial with stage V CKD, age, debility, protein calorie malnutrition playing a role   CKD (chronic kidney disease) stage 5, GFR less than 15 ml/min (HCC) At baseline and plans for HD through [place] I would think this could be a big contributing factor to his fatigue and other issues as well as covid/protein calorie malnutrition/age and debility  Avoid nephrotoxic drugs  Strict I/O trend  Type 2 diabetes mellitus without complication, with long-term current use of insulin (HCC) A1c of 7.9 from 8/21. Will check again with fatigue/FTT complaints  Continue 8 units long acting insulin and SSI with accuchecks   Chronic indwelling Foley catheter Has had since 2021, daughter and patient unsure why this was placed Foley care   Anemia Baseline of 10-11, stable. Continue to monitor   Hypertension Continue cardizem and hydralazine   Protein-calorie  malnutrition, severe FTT and protein calorie malnutrition Nutrition consult     Advance Care Planning:   Code Status: Full Code   Consults: PT/OT/SW and nutrition   DVT Prophylaxis: heparin   Family Communication: daughter on the phone: tabitha brown: [phone]  Severity of Illness: The appropriate patient status for this patient is OBSERVATION. Observation status is judged to be reasonable and necessary in order to provide the required intensity of service to ensure the patient's safety. The patient's presenting symptoms, physical exam findings, and initial radiographic and laboratory data in the context of their medical condition is felt to place them at decreased risk for further clinical deterioration.  Furthermore, it is anticipated that the patient will be medically stable for discharge from the hospital within 2 midnights of admission.   Author: Orma Flaming, MD 08/15/2021 2:14 PM  For on call review [email or web address].

## 2021-08-16 DIAGNOSIS — N185 Chronic kidney disease, stage 5: Secondary | ICD-10-CM | POA: Diagnosis present

## 2021-08-16 DIAGNOSIS — R627 Adult failure to thrive: Secondary | ICD-10-CM | POA: Diagnosis present

## 2021-08-16 DIAGNOSIS — Z978 Presence of other specified devices: Secondary | ICD-10-CM

## 2021-08-16 DIAGNOSIS — R262 Difficulty in walking, not elsewhere classified: Secondary | ICD-10-CM | POA: Diagnosis present

## 2021-08-16 DIAGNOSIS — U071 COVID-19: Secondary | ICD-10-CM

## 2021-08-16 LAB — CBC WITH DIFFERENTIAL/PLATELET
Abs Immature Granulocytes: 0.67 10*3/uL — ABNORMAL HIGH (ref 0.00–0.07)
Basophils Absolute: 0 10*3/uL (ref 0.0–0.1)
Basophils Relative: 0 %
Eosinophils Absolute: 0.1 10*3/uL (ref 0.0–0.5)
Eosinophils Relative: 0 %
HCT: 31.2 % — ABNORMAL LOW (ref 39.0–52.0)
Hemoglobin: 10.2 g/dL — ABNORMAL LOW (ref 13.0–17.0)
Immature Granulocytes: 3 %
Lymphocytes Relative: 6 %
Lymphs Abs: 1.4 10*3/uL (ref 0.7–4.0)
MCH: 29.7 pg (ref 26.0–34.0)
MCHC: 32.7 g/dL (ref 30.0–36.0)
MCV: 91 fL (ref 80.0–100.0)
Monocytes Absolute: 1.7 10*3/uL — ABNORMAL HIGH (ref 0.1–1.0)
Monocytes Relative: 7 %
Neutro Abs: 19.3 10*3/uL — ABNORMAL HIGH (ref 1.7–7.7)
Neutrophils Relative %: 84 %
Platelets: 237 10*3/uL (ref 150–400)
RBC: 3.43 MIL/uL — ABNORMAL LOW (ref 4.22–5.81)
RDW: 14 % (ref 11.5–15.5)
WBC: 23.2 10*3/uL — ABNORMAL HIGH (ref 4.0–10.5)
nRBC: 0 % (ref 0.0–0.2)

## 2021-08-16 LAB — BASIC METABOLIC PANEL
Anion gap: 13 (ref 5–15)
BUN: 63 mg/dL — ABNORMAL HIGH (ref 8–23)
CO2: 22 mmol/L (ref 22–32)
Calcium: 8.3 mg/dL — ABNORMAL LOW (ref 8.9–10.3)
Chloride: 108 mmol/L (ref 98–111)
Creatinine, Ser: 5.37 mg/dL — ABNORMAL HIGH (ref 0.61–1.24)
GFR, Estimated: 10 mL/min — ABNORMAL LOW (ref >60)
Glucose, Bld: 109 mg/dL — ABNORMAL HIGH (ref 70–99)
Potassium: 4.5 mmol/L (ref 3.5–5.1)
Sodium: 143 mmol/L (ref 135–145)

## 2021-08-16 LAB — GLUCOSE, CAPILLARY
Glucose-Capillary: 109 mg/dL — ABNORMAL HIGH (ref 70–99)
Glucose-Capillary: 175 mg/dL — ABNORMAL HIGH (ref 70–99)
Glucose-Capillary: 194 mg/dL — ABNORMAL HIGH (ref 70–99)
Glucose-Capillary: 110 mg/dL — ABNORMAL HIGH (ref 70–99)

## 2021-08-16 LAB — URINE CULTURE

## 2021-08-16 LAB — PROCALCITONIN: Procalcitonin: 0.85 ng/mL

## 2021-08-16 MED ORDER — ENSURE ENLIVE PO LIQD
237.0000 mL | Freq: Two times a day (BID) | ORAL | Status: DC
  Administered 2021-08-17 – 2021-08-19 (×6): 237 mL via ORAL

## 2021-08-16 MED ORDER — RENA-VITE PO TABS
1.0000 | ORAL_TABLET | Freq: Every day | ORAL | Status: DC
  Administered 2021-08-16 – 2021-08-18 (×3): 1 via ORAL
  Filled 2021-08-16 (×3): qty 1

## 2021-08-16 MED ORDER — CHLORHEXIDINE GLUCONATE CLOTH 2 % EX PADS
6.0000 | MEDICATED_PAD | Freq: Every day | CUTANEOUS | Status: DC
  Administered 2021-08-16 – 2021-08-19 (×4): 6 via TOPICAL

## 2021-08-16 NOTE — Progress Notes (Signed)
Initial Nutrition Assessment ? ?DOCUMENTATION CODES:  ? ?Not applicable ? ?INTERVENTION:  ? ?Renal Multivitamin w/ minerals daily ?Ensure Enlive po BID, each supplement provides 350 kcal and 20 grams of protein. ?Liberalize pt diet to regular due to poor PO appetite. ?Encourage good PO intake  ?Make pt assist for meal ordering.  ? ?NUTRITION DIAGNOSIS:  ? ?Inadequate oral intake related to poor appetite as evidenced by per patient/family report. ? ?GOAL:  ? ?Patient will meet greater than or equal to 90% of their needs ? ?MONITOR:  ? ?PO intake, Supplement acceptance, Labs, Weight trends ? ?REASON FOR ASSESSMENT:  ? ?Consult ?Assessment of nutrition requirement/status, Poor PO ? ?ASSESSMENT:  ? ?81 y.o. male presented to the ED with fatigue and "feeling bad." Pt recently admitted 2/25-3/2 for COVID infection and poor PO intake. PMH includes CKD V, T2DM, malnutrition, and CVA. Pt admitted with failure to thrive.  ? ?Pt reports that his appetite has been fair. Pt endorses a lost in his sense of taste and smell after having COVID, pt reports that it has returned but not completely. Pt reports that he has eggs for breakfast and items such as beans and potatoes for lunch and dinner. Pt did reports that he does drink supplements at home on occasion.  ?Per EMR, pt ate 100% of his lunch today. ? ?Pt reports that his UBW was around 160#. Pt was unsure if he has had any changes to his weight. Per EMR, pt has had a 14.2 kg weight gain in 1 week. Unsure if current weight or previous weight is inaccurate. Would benefit from weight being obtained on a standing scale.  ? ?Discussed ONS. Pt with a Glucerna at bedside. Pt reports that he is feeling full and not able to eat as much with drinking them. Pt reports that he has been drink 2 per day. Discussed with pt to try and eat food first and then in between meals sip on a supplement to just provide additional calories and protein.  ? ?Pt with no other questions or concerns at this  time.  ? ?Medications reviewed and include: Vitamin D3, SSI 0-6 units TID, Semglee ?Labs reviewed: Hgb A1c 7.9%, 24 hr CBG 109-163 ?  ? ?NUTRITION - FOCUSED PHYSICAL EXAM: ? ?Deferred to follow-up due to pt position.  ? ?Diet Order:   ?Diet Order   ? ?       ?  Diet renal/carb modified with fluid restriction Diet-HS Snack? Nothing; Fluid restriction: 1200 mL Fluid; Room service appropriate? Yes; Fluid consistency: Thin  Diet effective now       ?  ? ?  ?  ? ?  ? ? ?EDUCATION NEEDS:  ? ?No education needs have been identified at this time ? ?Skin:  Skin Assessment: Reviewed RN Assessment ? ?Last BM:  3/7 ? ?Height:  ? ?Ht Readings from Last 1 Encounters:  ?08/14/21 '5\' 7"'$  (1.702 m)  ? ? ?Weight:  ? ?Wt Readings from Last 1 Encounters:  ?08/14/21 81 kg  ? ? ?Ideal Body Weight:  67.3 kg ? ?BMI:  Body mass index is 27.97 kg/m?. ? ?Estimated Nutritional Needs:  ? ?Kcal:  2000-2200 ? ?Protein:  100-115 grams ? ?Fluid:  >/= 2 L ? ? ? ?Hermina Barters RD, LDN ?Clinical Dietitian ?See AMiON for contact information.  ? ?

## 2021-08-16 NOTE — NC FL2 (Signed)
?[place] MEDICAID FL2 LEVEL OF CARE SCREENING TOOL  ?  ? ?IDENTIFICATION  ?Patient Name: ?Paul Bond Birthdate: Sep 18, 1940 Sex: male Admission Date (Current Location): ?08/14/2021  ?[place] and [place] Number: ? [place] ?  Facility and Address:  ?[facility], [place] [address] [place], [place] [address] ?     Provider Number: ?[id number]  ?Attending Physician Name and Address:  ?Elgergawy, Silver Huguenin, MD ? Relative Name and Phone Number:  ?  ?   ?Current Level of Care: ?Hospital Recommended Level of Care: ?[place] Prior Approval Number: ?  ? ?Date Approved/Denied: ?  PASRR Number: ?[id number] ? ?Discharge Plan: ?SNF ?  ? ?Current Diagnoses: ?Patient Active Problem List  ? Diagnosis Date Noted  ? Failure to thrive in adult 08/15/2021  ? Hypertrophy of prostate without urinary obstruction and other lower urinary tract symptoms (LUTS) 08/15/2021  ? Other and unspecified hyperlipidemia 08/15/2021  ? Leukocytosis 08/15/2021  ? Chronic indwelling Foley catheter 08/15/2021  ? CKD (chronic kidney disease) stage 5, GFR less than 15 ml/min (HCC) 08/06/2021  ? COVID-19 virus infection 08/05/2021  ? Type 2 diabetes mellitus without complication, with long-term current use of insulin ([place]) 08/05/2021  ? Hypertension 08/05/2021  ? Stroke [facility]) 08/05/2021  ? Heme positive stool   ? Anemia   ? Benign neoplasm of colon   ? Colonic ulcer   ? Acute esophagitis   ? Obstructive uropathy 02/13/2020  ? 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 Severe sepsis ([place]) 02/13/2020  ? Protein-calorie malnutrition, severe 02/11/2020  ? AKI (acute kidney injury) ([place]) 02/02/2020  ? Hyperkalemia 02/02/2020  ? Hypermagnesemia 02/02/2020  ? Hypernatremia 02/02/2020  ? Metabolic acidosis 00/93/8182  ? ? ?Orientation RESPIRATION BLADDER Height & Weight   ?  ?Self, Situation, Time, Place ? Normal Incontinent, Indwelling catheter Weight: 178 lb 9.2 oz (81 kg) ?Height:  '5\' 7"'$  (170.2 cm)  ?BEHAVIORAL SYMPTOMS/MOOD  NEUROLOGICAL BOWEL NUTRITION STATUS  ?    Continent Diet (See dc summary)  ?AMBULATORY STATUS COMMUNICATION OF NEEDS Skin   ?Limited Assist Verbally Normal ?  ?  ?  ?    ?     ?     ? ? ?Personal Care Assistance Level of Assistance  ?Bathing, Feeding, Dressing Bathing Assistance: Limited assistance ?Feeding assistance: Independent ?Dressing Assistance: Limited assistance ?   ? ?Functional Limitations Info  ?Sight Sight Info: Impaired ?  ?   ? ? ?SPECIAL CARE FACTORS FREQUENCY  ?PT (By licensed PT), OT (By licensed OT)   ?  ?PT Frequency: 5x/week ?OT Frequency: 5x/week ?  ?  ?  ?   ? ? ?Contractures Contractures Info: Not present  ? ? ?Additional Factors Info  ?Insulin Sliding Scale, Code Status, Allergies Code Status Info: Full ?Allergies Info: Lisinopril, Semaglutide ?  ?Insulin Sliding Scale Info: see dc summary ?  ?   ? ?Current Medications (08/16/2021):  This is the current hospital active medication list ?Current Facility-Administered Medications  ?Medication Dose Route Frequency Provider Last Rate Last Admin  ? 0.9 %  sodium chloride infusion  250 mL Intravenous PRN Orma Flaming, MD      ? acetaminophen (TYLENOL) tablet 650 mg  650 mg Oral Q6H PRN Orma Flaming, MD      ? Or  ? acetaminophen (TYLENOL) suppository 650 mg  650 mg Rectal Q6H PRN Orma Flaming, MD      ? cefTRIAXone (ROCEPHIN) 1 g in sodium chloride 0.9 % 100 mL IVPB  1 g  Intravenous Q24H Orma Flaming, MD 200 mL/hr at 08/16/21 1336 1 g at 08/16/21 1336  ? Chlorhexidine Gluconate Cloth 2 % PADS 6 each  6 each Topical Daily Elgergawy, Silver Huguenin, MD      ? cholecalciferol (VITAMIN D3) tablet 2,000 Units  2,000 Units Oral Daily Orma Flaming, MD   2,000 Units at 08/16/21 [phone]  ? diltiazem (CARDIZEM CD) 24 hr capsule 240 mg  240 mg Oral Daily Orma Flaming, MD   240 mg at 08/16/21 0272  ? heparin injection 5,000 Units  5,000 Units Subcutaneous Q8H Orma Flaming, MD   5,000 Units at 08/16/21 1332  ? hydrALAZINE (APRESOLINE) tablet 25 mg  25 mg  Oral TID Orma Flaming, MD   25 mg at 08/16/21 0843  ? insulin aspart (novoLOG) injection 0-6 Units  0-6 Units Subcutaneous TID WC Orma Flaming, MD   1 Units at 08/16/21 1332  ? insulin glargine-yfgn (SEMGLEE) injection 8 Units  8 Units Subcutaneous Daily Orma Flaming, MD   8 Units at 08/16/21 [phone]  ? latanoprost (XALATAN) 0.005 % ophthalmic solution 1 drop  1 drop Both Eyes QPM Orma Flaming, MD      ? sodium bicarbonate tablet 1,300 mg  1,300 mg Oral BID Orma Flaming, MD   1,300 mg at 08/16/21 4403  ? sodium chloride flush (NS) 0.9 % injection 3 mL  3 mL Intravenous Q12H Orma Flaming, MD   3 mL at 08/16/21 0844  ? sodium chloride flush (NS) 0.9 % injection 3 mL  3 mL Intravenous PRN Orma Flaming, MD      ? ? ? ?Discharge Medications: ?Please see discharge summary for a list of discharge medications. ? ?Relevant Imaging Results: ? ?Relevant Lab Results: ? ? ?Additional Information ?SSN: [id number]. No COVID vaccines in system. Was COVID+ on 08/05/21, off precautions. ? ?Benard Halsted, LCSW ? ? ? ? ?

## 2021-08-16 NOTE — Evaluation (Addendum)
Occupational Therapy Evaluation Patient Details Name: Paul Bond MRN: [id number] DOB: 1941/05/23 Today's Date: 08/16/2021   History of Present Illness Paul Bond is a 81 y.o. male presenting with progressive fatigue and weakness after recent discharge/COVID-19 infection (2/25-3/2). Pt with medical history significant of DM; HTN; CVA; CKD5 s/p remote L nephrectomy, and nephrolithiasis s/p stent placement multiple times   Clinical Impression   PTA, pt typically lives with spouse (who is at [facility] rehab currently) and reports Modified Independent with ADLs, IADLs and mobility typically using cane vs Rollator recently. Pt presents with minor deficits in endurance, cognition, dynamic standing balance and strength. Overall, pt requires Setup for UB ADLs, Supervision for LB ADLs and supervision for in-room mobility using RW. Pt with slower processing and requires increased time to follow directions - would recommend pt limit driving currently d/t this. Pt reports feeling comfortable returning home, not necessarily interested in rehab at this time. If family able to provide initial support for IADLs (meal prep, med mgmt, heavier cleaning tasks, and transportation), pt appropriate to DC home with [place] follow-up. However, if this support cannot be provided at DC, may need to consider SNF rehab to maximize strength in all aspects of daily living. Attempted to contact pt daughter for further info via phone, but no answer.  HR 80-90s at rest, up to 120s with activity SpO2 >94% on RA.      Recommendations for follow up therapy are one component of a multi-disciplinary discharge planning process, led by the attending physician.  Recommendations may be updated based on patient status, additional functional criteria and insurance authorization.   Follow Up Recommendations  Home health OT (SNF rehab if family unable to assist with IADLs initially)   Assistance Recommended at Discharge Intermittent  Supervision/Assistance  Patient can return home with the following A Edwin help with walking and/or transfers;A Padin help with bathing/dressing/bathroom;Assistance with cooking/housework;Assist for transportation;Help with stairs or ramp for entrance;Direct supervision/assist for medications management;Direct supervision/assist for financial management    Functional Status Assessment  Patient has had a recent decline in their functional status and demonstrates the ability to make significant improvements in function in a reasonable and predictable amount of time.  Equipment Recommendations  None recommended by OT    Recommendations for Other Services       Precautions / Restrictions Precautions Precautions: Fall;Other (comment) Precaution Comments: monitor HR Restrictions Weight Bearing Restrictions: No      Mobility Bed Mobility Overal bed mobility: Modified Independent Bed Mobility: Supine to Sit     Supine to sit: Modified independent (Device/Increase time)          Transfers Overall transfer level: Needs assistance Equipment used: Rolling walker (2 wheels) Transfers: Sit to/from Stand Sit to Stand: Supervision           General transfer comment: able to power up without assist and without LOB, minor cues for turning with RW to recliner      Balance Overall balance assessment: Needs assistance, Mild deficits observed, not formally tested Sitting-balance support: Feet supported Sitting balance-[name] Scale: Normal     Standing balance support: During functional activity Standing balance-[name] Scale: Fair Standing balance comment: able to statically stand without UE support                           ADL either performed or assessed with clinical judgement   ADL Overall ADL's : Needs assistance/impaired Eating/Feeding: Independent;Sitting Eating/Feeding Details (indicate cue type and  reason): able to open packaging to prepare coffee Grooming:  Supervision/safety;Standing;Oral care;Wash/dry face Grooming Details (indicate cue type and reason): standing at sink Upper Body Bathing: Set up;Sitting   Lower Body Bathing: Supervison/ safety;Sit to/from stand   Upper Body Dressing : Set up;Sitting   Lower Body Dressing: Supervision/safety;Sitting/lateral leans;Sit to/from stand Lower Body Dressing Details (indicate cue type and reason): increased time but able to cross LEs and bend to reach feet for sock mgmt Toilet Transfer: Supervision/safety;Ambulation;Rolling walker (2 wheels)   Toileting- Clothing Manipulation and Hygiene: Supervision/safety;Sitting/lateral lean;Sit to/from stand       Functional mobility during ADLs: Supervision/safety;Rolling walker (2 wheels) General ADL Comments: Minor cues for sequencing DME needed, slower pace though no overt LOB.     Vision Baseline Vision/History: 1 Wears glasses Ability to See in Adequate Light: 1 Impaired Patient Visual Report: No change from baseline Vision Assessment?: No apparent visual deficits     Perception     Praxis      Pertinent Vitals/Pain Pain Assessment Pain Assessment: No/denies pain     Hand Dominance Right   Extremity/Trunk Assessment Upper Extremity Assessment Upper Extremity Assessment: Overall WFL for tasks assessed   Lower Extremity Assessment Lower Extremity Assessment: Defer to PT evaluation   Cervical / Trunk Assessment Cervical / Trunk Assessment: Kyphotic   Communication Communication Communication: No difficulties   Cognition Arousal/Alertness: Awake/alert Behavior During Therapy: Flat affect, WFL for tasks assessed/performed Overall Cognitive Status: No family/caregiver present to determine baseline cognitive functioning Area of Impairment: Problem solving, Safety/judgement                         Safety/Judgement: Decreased awareness of deficits   Problem Solving: Slow processing, Requires verbal cues General Comments:  slower processing, some decreased awareness of deficits; reports still comfortable driving, etc though delayed response time noted.     General Comments  SpO2 >94% on RA. HR 80-90s at rest, up to 120 with activity. pt endorses "a Bunch" fatigue standing at sink for grooming tasks. Pt reports he does not feel he needs rehab placement; feels comfortable going home. Discussed with RNs previous COVID+ test, if precautions still needed to be posted on door if pt out of window    Exercises     Shoulder Instructions      Home Living Family/patient expects to be discharged to:: Private residence Living Arrangements: Other (Comment) (lives with spouse but per pt, she has been in rehab ([facility]) for "2 months" and unsure of when she will be back) Available Help at Discharge: Family;Available PRN/intermittently Type of Home: House Home Access: Stairs to enter [employer] of Steps: 3   Home Layout: One level     Bathroom Shower/Tub: Occupational psychologist: Standard     Home Equipment: Cane - single point;Rollator (4 wheels);Shower seat          Prior Functioning/Environment Prior Level of Function : Needs assist             Mobility Comments: SPC intermittently. Reports recent use of Rollator, uses it "sometimes" in the home ADLs Comments: indep, drives, manages med and finances. reports recent assist with meals, grocery shopping but later reports still driving (when asked where, pt reports grocery store)        OT Problem List: Decreased strength;Decreased activity tolerance;Impaired balance (sitting and/or standing);Decreased knowledge of use of DME or AE      OT Treatment/Interventions: Therapeutic exercise;Self-care/ADL training;DME and/or AE instruction;Therapeutic activities;Patient/family education;Balance  training    OT Goals(Current goals can be found in the care plan section) Acute Rehab OT Goals Patient Stated Goal: go home rather than go to  rehab OT Goal Formulation: With patient Time For Goal Achievement: 08/30/21 Potential to Achieve Goals: Good ADL Goals Pt Will Transfer to Toilet: with modified independence;ambulating Pt Will Perform Tub/Shower Transfer: Tub transfer;Shower transfer;with modified independence;ambulating Pt/caregiver will Perform Home Exercise Program: Increased strength;Both right and left upper extremity;With theraband;Independently;With written HEP provided Additional ADL Goal #1: Pt to increase activity tolerance > 7-10 min during ADL/IADL tasks Additional ADL Goal #2: Pt to demo ability to gather ADL/IADL items MOD I without LOB Additional ADL Goal #3: Pt to complete pill box test with 0 errors  OT Frequency: Min 2X/week    Co-evaluation              AM-PAC OT "6 Clicks" Daily Activity     Outcome Measure Help from another person eating meals?: None Help from another person taking care of personal grooming?: A Cade Help from another person toileting, which includes using toliet, bedpan, or urinal?: A Tencza Help from another person bathing (including washing, rinsing, drying)?: A Markson Help from another person to put on and taking off regular upper body clothing?: A Gorey Help from another person to put on and taking off regular lower body clothing?: A Buccieri 6 Click Score: 19   End of Session Equipment Utilized During Treatment: Rolling walker (2 wheels) Nurse Communication: Mobility status  Activity Tolerance: Patient tolerated treatment well Patient left: in chair;with call bell/phone within reach;with chair alarm set  OT Visit Diagnosis: Unsteadiness on feet (R26.81);Other abnormalities of gait and mobility (R26.89)                Time: 0762-2633 OT Time Calculation (min): 33 min Charges:  OT General Charges $OT Visit: 1 Visit OT Evaluation $OT Eval Low Complexity: 1 Low OT Treatments $Self Care/Home Management : 8-22 mins  Malachy Chamber, OTR/L Acute Rehab Services Office:  [phone]   Layla Maw 08/16/2021, 8:09 AM

## 2021-08-16 NOTE — Care Management Obs Status (Signed)
MEDICARE OBSERVATION STATUS NOTIFICATION ? ? ?Patient Details  ?Name: Paul Bond ?MRN: [id number] ?Date of Birth: 06/12/1940 ? ? ?Medicare Observation Status Notification Given:  Yes ? ? ? ?Cyndi Bender, RN ?08/16/2021, 11:48 AM ?

## 2021-08-16 NOTE — Progress Notes (Signed)
PROGRESS NOTE    Paul Bond  YJE:[id number] DOB: 20-May-1941 DOA: 08/14/2021 PCP: [facility]    Chief Complaint  Patient presents with   Covid Fatigue    Brief Narrative:    Paul Bond is a 81 y.o. 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 He feels tired and weak and has  had poor PO intake. His daughter confirms this. She states he is still not eating well and is just weak and tired since hospital discharge. She doesn't think  he is safe to stay at home and needs placement.    Hospitalized on 08/05/21-08/10/21 for poor PO intake, weakness and malaise and ambulatory dysfunction due to covid infection. He intially tested positive on 2/21.  Given 5 day course of molnupiravir and decadron in hospital x 3 days.    No fever/chills, no headaches/dizziness/no chest pain or palpitations, no shortness of breath or cough, no stomach pain, N/V/D, no leg swelling.  Chronic catheter. No CVA tenderness.    His niece cooks his meals for him.   ER Course:  vitals: afebrile, bp: 131/98, HR: 102, RR: 20, oxygen: 99%RA Pertinent labs: wbc: 24.3, hgb: 10.2 (10-11), pct: .67, lactic acid wnl x 2, BC pending, BUN: 66, creatinine: 5.37,  CxR: no acute findings, minimal left basilar atelectasis  In ED given 500cc bolus.   Assessment & Plan:   Principal Problem:   Failure to thrive in adult Active Problems:   Leukocytosis   COVID-19 virus infection   CKD (chronic kidney disease) stage 5, GFR less than 15 ml/min (HCC)   Type 2 diabetes mellitus without complication, with long-term current use of insulin (HCC)   Chronic  indwelling Foley catheter   Anemia   Hypertension   Protein-calorie malnutrition, severe  Failure to thrive in adult/deconditioning 81 year old male with nearly 4 week history of progressive decline including severely decreased PO intake, weakness/fatigue/malaise, inability to perform ADLs and mild weight loss -multifactorial causes for FTT including CKD stage V, recent covid infection, age, debility, wife going to rehab and him at home alone, as well due to UTI. -PT/OT consulted, mentation for SNF placement. -nutrition consult  -TSH, and B12 not low, vitamin D level is low, already on supplements UTI   UTI -Patient has a chronic Foley, he reports it has not been changed for at least 30 days, UA strongly positive, he is with significant leukocytosis, and procalcitonin is trending up. -Continue with IV Rocephin and follow-up on urine cultures.     COVID-19 virus infection - Positive test on 2/21 with hospitalization from 2/25-3/2 for fatigue/ambulatory dysfunctin -completed 3 day course of decadron and 5 day course of molnupiravir -No hypoxia, not pneumonia, more than 10 days from initial infection, for COVID isolation, will discontinue.  CKD (chronic kidney disease) stage 5, GFR less than 15 ml/min (HCC) At baseline and plans for HD through [place] I would think this could be a big contributing factor to his fatigue and other issues as well as covid/protein calorie malnutrition/age and debility  Avoid nephrotoxic drugs  Strict I/O trend   Type 2 diabetes mellitus without complication, with long-term current use  of insulin (HCC) A1c of 7.9 from 8/21. Will check again with fatigue/FTT complaints  Continue 8 units long acting insulin and SSI with accuchecks    Chronic indwelling Foley catheter Has had since 2021, daughter and patient unsure why this was placed Foley care , patient report last time Foley catheter was changed around 30 days ago, I will change especially with the presence of  UTI.   Anemia Baseline of 10-11, stable. Continue to monitor    Hypertension Continue cardizem and hydralazine    Protein-calorie malnutrition, severe FTT and protein calorie malnutrition Nutrition consult    DVT prophylaxis: Heparin Code Status: Full Family Communication:  Disposition:   Status is: Observation The patient will require care spanning > 2 midnights and should be moved to inpatient because: week, unsafe, will need SNF placement, and further treatment for his   Consultants:  None    Subjective:  Patient reports generalized weakness, facility, he denies any chest pain, fever or chills.  Objective: Vitals:   08/16/21 0000 08/16/21 0400 08/16/21 0746 08/16/21 1156  BP: (!) 168/59 (!) 160/64 130/69 (!) 157/66  Pulse: 72 81 87 79  Resp: '11 20 17 19  '$ Temp: 98.5 F (36.9 C) 99 F (37.2 C) 99 F (37.2 C) 98.3 F (36.8 C)  TempSrc: Oral Oral Oral Oral  SpO2: 96% 97% 98% 93%  Weight:      Height:        Intake/Output Summary (Last 24 hours) at 08/16/2021 1443 Last data filed at 08/16/2021 1200 Gross per 24 hour  Intake 480 ml  Output 1100 ml  Net -620 ml   Filed Weights   08/14/21 2237  Weight: 81 kg    Examination:  Awake Alert, Oriented X 3, frail, deconditioned. Symmetrical Chest wall movement, Good air movement bilaterally, CTAB RRR,No Gallops,Rubs or new Murmurs, No Parasternal Heave +ve B.Sounds, Abd Soft, No tenderness, No rebound - guarding or rigidity. No Cyanosis, Clubbing or edema, No new Rash or bruise      Data Reviewed: I have personally reviewed following labs and imaging studies  CBC: Recent Labs  Lab 08/10/21 0351 08/14/21 2259 08/15/21 1426 08/16/21 0452  WBC 10.3 24.3* 27.7* 23.2*  NEUTROABS 9.4* 22.4*  --  19.3*  HGB 9.7* 10.2* 10.0* 10.2*  HCT 29.2* 32.5* 32.1* 31.2*  MCV 88.0 92.6 93.3 91.0  PLT 222 260 247 250    Basic Metabolic Panel: Recent Labs  Lab 08/10/21 0351 08/14/21 2259 08/15/21 1426  08/16/21 0452  NA 140 143  --  143  K 3.8 4.4  --  4.5  CL 98 110  --  108  CO2 30 21*  --  22  GLUCOSE 157* 191*  --  109*  BUN 73* 66*  --  63*  CREATININE 5.73* 5.37* 5.93* 5.37*  CALCIUM 7.7* 8.3*  --  8.3*    GFR: Estimated Creatinine Clearance: 11.2 mL/min (A) (by C-G formula based on SCr of 5.37 mg/dL (H)).  Liver Function Tests: Recent Labs  Lab 08/10/21 0351 08/14/21 2259  AST 19 13*  ALT 12 20  ALKPHOS 48 60  BILITOT 0.2* 0.6  PROT 4.9* 6.4*  ALBUMIN 1.8* 2.2*    CBG: Recent Labs  Lab 08/15/21 1735 08/15/21 2218 08/15/21 2302 08/16/21 0749 08/16/21 1202  GLUCAP 156* 119* 125* 109* 175*     Recent Results (from the past 240 hour(s))  Culture, blood (routine x 2)     Status: None (Preliminary result)   Collection Time: 08/15/21  9:33 AM   Specimen: BLOOD  Result Value Ref Range Status   Specimen Description BLOOD SITE NOT SPECIFIED  Final   Special Requests   Final    BOTTLES DRAWN AEROBIC AND ANAEROBIC Blood Culture results may not be optimal due to an excessive volume of blood received in culture bottles   Culture   Final    NO GROWTH 1 DAY Performed at [facility] [address], [place], [place] [address]    Report Status PENDING  Incomplete  Culture, blood (routine x 2)     Status: None (Preliminary result)   Collection Time: 08/15/21  9:58 AM   Specimen: BLOOD  Result Value Ref Range Status   Specimen Description BLOOD SITE NOT SPECIFIED  Final   Special Requests   Final    BOTTLES DRAWN AEROBIC AND ANAEROBIC Blood Culture results may not be optimal due to an inadequate volume of blood received in culture bottles   Culture   Final    NO GROWTH 1 DAY Performed at [facility], [place] [address] [place], [place] [address]    Report Status PENDING  Incomplete  Urine Culture     Status: Abnormal   Collection Time: 08/15/21  1:52 PM   Specimen: Urine, Catheterized  Result Value Ref Range Status   Specimen Description URINE,  CATHETERIZED  Final   Special Requests   Final    NONE Performed at [facility], [place] [address] [place],  [address]    Culture MULTIPLE SPECIES PRESENT, SUGGEST RECOLLECTION (A)  Final   Report Status 08/16/2021 FINAL  Final         Radiology Studies: DG Chest 2 View  Result Date: 08/14/2021 CLINICAL DATA:  COVID infection with generalized weakness EXAM: CHEST - 2 VIEW COMPARISON:  08/05/2021 FINDINGS: Cardiac shadow is within normal limits. Aortic calcifications are seen. The lungs are clear bilateral with the exception of minimal left basilar atelectasis. No sizable effusion is noted. No bony abnormality is seen. IMPRESSION: Minimal left basilar atelectasis. Electronically Signed   By: Inez Catalina M.D.   On: 08/14/2021 23:19        Scheduled Meds:  Chlorhexidine Gluconate Cloth  6 each Topical Daily   cholecalciferol  2,000 Units Oral Daily   diltiazem  240 mg Oral Daily   heparin  5,000 Units Subcutaneous Q8H   hydrALAZINE  25 mg Oral TID   insulin aspart  0-6 Units Subcutaneous TID WC   insulin glargine-yfgn  8 Units Subcutaneous Daily   latanoprost  1 drop Both Eyes QPM   sodium bicarbonate  1,300 mg Oral BID   sodium chloride flush  3 mL Intravenous Q12H   Continuous Infusions:  sodium chloride     cefTRIAXone (ROCEPHIN)  IV 1 g (08/16/21 1336)     LOS: 0 days      Phillips Climes, MD [facility]   To contact the attending provider between 7A-7P or the covering provider during after hours 7P-7A, please log into the web site [email or web address] and access using universal [place] password for that web site. If you do not have the password, please call the hospital operator.  08/16/2021, 2:43 PM

## 2021-08-16 NOTE — Progress Notes (Signed)
?  Transition of Care (TOC) Screening Note ? ? ?Patient Details  ?Name: Paul Bond ?Date of Birth: [birth year] ? ? ?Transition of Care (TOC) CM/SW Contact:    ?Benard Halsted, LCSW ?Phone Number: ?08/16/2021, 9:17 AM ? ? ? ?Transition of Care Department [facility]) has reviewed patient and no TOC needs have been identified at this time. We will continue to monitor patient advancement through interdisciplinary progression rounds. If new patient transition needs arise, please place a TOC consult. ? ?Online VA notification completed, ID# [id number] ? ? ?

## 2021-08-16 NOTE — TOC Initial Note (Signed)
Transition of Care [facility]) - Initial/Assessment Note    Patient Details  Name: Paul Bond MRN: [id number] Date of Birth: Feb 15, 1941  Transition of Care [facility]) CM/SW Contact:    Benard Halsted, LCSW Phone Number: 08/16/2021, 5:00 PM  Clinical Narrative:                 3pm-CSW received consult for possible SNF placement at time of discharge. CSW spoke with patient. Patient reported that he would prefer to discharge home with home health. CSW explained therapy recommendation and that patient's daughter had requested SNF for him. Patient expressed understanding and is ultimately agreeable to SNF placement at time of discharge. CSW discussed insurance authorization process and will provide Medicare SNF ratings list.   CSW left list of SNF bed offers with patient. He provided permission for CSW to contact his daughter.    4:30pm-Patient's daughter present in room and requested SNF for patient. She reported that patient's spouse is under long term care Medicaid at [place] and she is hoping to find a SNF that can accept both patients. She asked if patient could go under his VA benefits. CSW explained that patient would have to use his Medicare benefits for placement as the [place] process is lengthy and she will need to contact his [place] social worker to work on that. She requested CSW check with [facility] and [facility] to see if they could take both her parents. CSW explained that facility would be unable to say if they could accept her mom due to not having her referral but that CSW could ask if a Medicaid bed would be potentially open.  CSW spoke with [facility] and they can accept patient for short term rehab and then have daughter submit clinicals for her mom for review. [facility] not accepting ltc patients at this time. CSW will follow up.   Skilled Nursing Rehab Facilities-   [email or web address]   Ratings out of 5 possible   Name Address  Phone # [place] Inspection Overall  [facility] [address] [place] '5 5 2 4  '$ [facility]  [address] [place], [place] [phone] '4 2 5 5  '$ [facility] [place], [place] '4 1 1 1  '$ [facility] [place], [place] '2 2 4 4  '$ [facility] [address], [place] '1 1 2 1  '$ [place] [address], [place] [phone] '2 1 4 3  '$ [facility] [address], [place] '5 2 2 3  '$ [facility] [address] [place] '5 2 2 3  '$ [address] ([place]) [place], [place] [phone] '5 1 2 2  '$ [facility] [phone], [place] [phone] '4 1 1 1  '$ [facility] [address], [facility] [phone] '4 1 2 1  '$ [facility] ([place]) [place] [phone] '4 1 1 1          '$ [place], [place] [address] [place] '4 2 3 3  '$ [facility] [place] [address] [place] '3 1 5 4  '$ [facility], [place] [address], [place] [phone] '2 1 1 1  '$ [facility] [address] [phone] '2 2 3 3          '$ [address] (no [facility]) [facility] [address],  [place] [phone] '4 5 5 5  '$ [facility] (No Humana) [address], [place] [phone] '4 1 4 3  '$ [facility] (No UHC) [address], [place] [phone] '5 5 5 5  '$ [facility] [address] [place] [phone] '3 2 4 4  '$ [facility] [address] [place] [phone] '3 3 4 4  '$ [facility] [address], [place] '1 1 2 1  '$ [facility] [address] [place] [phone] '2 1 1 1  '$ [facility] [place], [place] '5 2 4 5  '$ [facility] [address], [place] '3 1 1  1  '$ [facility] [place], [place] '2 1 2 1          '$ [facility] ([place]) [address] [place] [phone] '1 1 1 1  '$ [place] [address] [place]  [phone] '2 4 2 2  '$ [facility] [address], [place] [phone] '5 2 3 4  '$ [facility] [place] [address] [place] '2 1 1 1  '$ [place] (No Humana) [address], [place] [phone] '2 1 3 2  '$ [facility] [address], [place] [phone] '3 1 1 1          '$ [facility] [place], [place] '5 4 5 5  '$ [facility])  [facility] [address], [place] '2 2 3 3  '$ [facility]) [place] [address] [place] '3 2 4 4  '$ [facility] [address] [place] '4 3 4 4  '$ [address] [place], [place] '3 3 1 1  '$ [facility]) [address] [place] [phone] '2 2 4 4      '$ Expected Discharge Plan: [place] Barriers to Discharge: Insurance Authorization, Continued Medical Work up, SNF Pending bed offer   Patient Goals and CMS Choice Patient states their goals for this hospitalization and ongoing recovery are:: Rehab CMS Medicare.gov Compare Post Acute Care list provided to:: Patient Choice offered to / list presented to : Patient, Adult Children  Expected Discharge Plan and Services Expected Discharge Plan: [place] In-house Referral: Clinical Social Work   Post Acute Care Choice: [place] Living arrangements for the past 2 months: [place]                                      Prior Living Arrangements/Services Living arrangements for the past 2 months: Single Family Home Lives with:: Self Patient language and need for interpreter reviewed:: Yes Do you feel safe going back to the place where you live?: Yes      Need for Family Participation in  Patient Care: Yes (Comment) Care giver support system in place?: Yes (comment)   Criminal Activity/Legal Involvement Pertinent to Current Situation/Hospitalization: No - Comment as needed  Activities of Daily Living Home Assistive Devices/Equipment: None ADL Screening (condition at time of admission) Patient's cognitive ability adequate to safely complete daily activities?: Yes Is the patient deaf or have difficulty hearing?: No Does the patient have difficulty seeing, even when wearing glasses/contacts?: No Does the patient have difficulty concentrating, remembering, or making decisions?: No Patient able to express need for assistance with ADLs?: Yes Does the patient have difficulty dressing or bathing?: No Independently performs ADLs?: No Communication: Independent  Dressing (OT): Needs assistance Is this a change from baseline?: Pre-admission baseline Grooming: Needs assistance Is this a change from baseline?: Pre-admission baseline Feeding: Independent Bathing: Needs assistance Is this a change from baseline?: Pre-admission baseline Toileting: Needs assistance Is this a change from baseline?: Pre-admission baseline In/Out Bed: Needs assistance Is this a change from baseline?: Pre-admission baseline Does the patient have difficulty walking or climbing stairs?: Yes Weakness of Legs: Both Weakness of Arms/Hands: None  Permission Sought/Granted Permission sought to share information with : Facility Sport and exercise psychologist, Family Supports Permission granted to share information with : Yes, Verbal Permission Granted  Share Information with NAME: Lawerance Bach  Permission granted to share info w AGENCY: SNFs  Permission granted to share info w Relationship: Daughter  Permission granted to share info w Contact Information: [phone]  Emotional Assessment Appearance:: Appears stated age Attitude/Demeanor/Rapport: Engaged Affect (typically observed): Accepting,  Appropriate Orientation: : Oriented to Self, Oriented to Place, Oriented to  Time, Oriented to Situation Alcohol / Substance Use: Not Applicable Psych Involvement: No (comment)  Admission diagnosis:  Transient alteration of awareness [R40.4] Ambulatory dysfunction [R26.2] Urinary tract infection without hematuria, site unspecified [N39.0] Patient Active Problem List   Diagnosis Date Noted   Ambulatory dysfunction 08/16/2021   Failure to thrive in adult 08/15/2021   Hypertrophy of prostate without urinary obstruction and other lower urinary tract symptoms (LUTS) 08/15/2021   Other and unspecified hyperlipidemia 08/15/2021   Leukocytosis 08/15/2021   Chronic indwelling Foley catheter 08/15/2021   CKD (chronic kidney disease) stage 5, GFR less than 15 ml/min (HCC) 08/06/2021   COVID-19 virus infection 08/05/2021   Type 2 diabetes mellitus without complication, with long-term current use of insulin ([place]) 08/05/2021   Hypertension 08/05/2021   Stroke ([place]) 08/05/2021   Heme positive stool    Anemia    Benign neoplasm of colon    Colonic ulcer    Acute esophagitis    Obstructive uropathy 02/13/2020   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   Severe sepsis ([place]) 02/13/2020   Protein-calorie malnutrition, severe 02/11/2020   AKI (acute kidney injury) ([place]) 02/02/2020   Hyperkalemia 02/02/2020   Hypermagnesemia 02/02/2020   Hypernatremia 17/79/3903   Metabolic acidosis 00/92/3300   PCP:  [facility]:   [place], [place] [address] Phone: [phone] Fax: [place], [place] - [place] [address] [place] [address] Phone: [phone] Fax: [phone]     Social Determinants of Health (SDOH) Interventions    Readmission Risk Interventions Readmission Risk Prevention Plan  08/07/2021  Transportation Screening Complete  PCP or Specialist Appt within 3-5 Days Complete  HRI or Home Care Consult Complete  Social Work Consult for [place] Planning/Counseling Complete  Palliative Care Screening Not Applicable  Medication Review Press photographer) Complete  Some recent data might be hidden

## 2021-08-16 NOTE — Evaluation (Signed)
Physical Therapy Evaluation ?Patient Details ?Name: Paul Bond ?MRN: [id number] ?DOB: 11/07/1940 ?Today's Date: 08/16/2021 ? ?History of Present Illness ? Keylen Eckenrode Parrella is a 81 y.o. male presenting with progressive fatigue and weakness after recent discharge/COVID-19 infection (2/25-3/2). Pt with medical history significant of DM; HTN; CVA; CKD5 s/p remote L nephrectomy, and nephrolithiasis s/p stent placement multiple times  ?Clinical Impression ?  ?Pt admitted with above diagnosis. Lives at home alone, in a single-level home with a few steps to enter; Prior to previous admission, pt was able to manage independently; He discharged home last week and had lots of difficulty managing due to malaise, and fatigue; He " just felt bad" and was unable to do much; Presents to PT with significant fatigue effecting functional mobility, and activity tolerance;  If family able to provide initial support for IADLs (meal prep, med mgmt, heavier cleaning tasks, and transportation), pt appropriate to DC home with HHPT follow-up. However, if this support cannot be provided at DC, we must  consider SNF rehab to maximize strength in all aspects of daily livingPt currently with functional limitations due to the deficits listed below (see PT Problem List). Pt will benefit from skilled PT to increase their independence and safety with mobility to allow discharge to the venue listed below.      ?   ? ?Recommendations for follow up therapy are one component of a multi-disciplinary discharge planning process, led by the attending physician.  Recommendations may be updated based on patient status, additional functional criteria and insurance authorization. ? ?Follow Up Recommendations Skilled nursing-short term rehab (<3 hours/day) ? ?  ?Assistance Recommended at Discharge Set up Supervision/Assistance  ?Patient can return home with the following ? Assistance with cooking/housework ? ?  ?Equipment Recommendations None recommended by PT   ?Recommendations for Other Services ?    ?  ?Functional Status Assessment Patient has had a recent decline in their functional status and demonstrates the ability to make significant improvements in function in a reasonable and predictable amount of time.  ? ?  ?Precautions / Restrictions Precautions ?Precautions: Fall;Other (comment) ?Precaution Comments: monitor HR  ? ?  ? ?Mobility ? Bed Mobility ?  ?  ?  ?  ?  ?  ?  ?  ?  ? ?Transfers ?Overall transfer level: Needs assistance ?Equipment used: Rolling walker (2 wheels) ?Transfers: Sit to/from Stand ?Sit to Stand: Min guard (With phsyical contact) ?  ?  ?  ?  ?  ?General transfer comment: Slow rise, and pt reporting feeling lousy; Mingurd for safety ?  ? ?Ambulation/Gait ?  ?  ?  ?  ?  ?  ?  ?General Gait Details: Reports exhaustion, and politley declining amb ? ?Stairs ?  ?  ?  ?  ?  ? ?Wheelchair Mobility ?  ? ?Modified Rankin (Stroke Patients Only) ?  ? ?  ? ?Balance   ?  ?  ?  ?  ?  ?Standing balance-[name] Scale: Fair ?Standing balance comment: able to statically stand without UE support; but standing toelrance and standing time significantly decr compared to last week ?  ?  ?  ?  ?  ?  ?  ?  ?  ?  ?  ?   ? ? ? ?Pertinent Vitals/Pain Pain Assessment ?Pain Assessment: No/denies pain ?Pain Intervention(s): Monitored during session  ? ? ?Home Living Family/patient expects to be discharged to:: Private residence ?Living Arrangements: Other (Comment) (lives with spouse but per pt, she has  been in rehab ([facility]) for "2 months" and unsure of when she will be back) ?Available Help at Discharge: Family;Available PRN/intermittently (Reports daughter and neice check in daily, and neice brings him meals) ?Type of Home: House ?Home Access: Stairs to enter ?  ?Entrance Stairs-Number of Steps: 3 ?  ?Home Layout: One level ?Home Equipment: Cane - single point;Rollator (4 wheels);Shower seat ?   ?  ?Prior Function Prior Level of Function : Needs assist ?  ?  ?  ?  ?  ?   ?Mobility Comments: SPC intermittently. Reports recent use of Rollator, uses it "sometimes" in the home ?ADLs Comments: indep, drives, manages med and finances. reports recent assist with meals, grocery shopping but later reports still driving (when asked where, pt reports grocery store) ?  ? ? ?Hand Dominance  ? Dominant Hand: Right ? ?  ?Extremity/Trunk Assessment  ? Upper Extremity Assessment ?Upper Extremity Assessment: Defer to OT evaluation ?  ? ?Lower Extremity Assessment ?Lower Extremity Assessment: Generalized weakness ?  ? ?   ?Communication  ? Communication: No difficulties  ?Cognition Arousal/Alertness: Awake/alert ?Behavior During Therapy: Flat affect, WFL for tasks assessed/performed ?Overall Cognitive Status: No family/caregiver present to determine baseline cognitive functioning ?Area of Impairment: Problem solving, Safety/judgement ?  ?  ?  ?  ?  ?  ?  ?  ?  ?  ?  ?  ?Safety/Judgement: Decreased awareness of deficits ?  ?Problem Solving: Slow processing, Requires verbal cues ?General Comments: slower processing, some decreased awareness of deficits; reports still comfortable driving, etc though delayed response time noted. ?  ?  ? ?  ?General Comments General comments (skin integrity, edema, etc.): SpO2 >94% on RA. HR 80-90s at rest, up to 120 with activity. ? ?  ?Exercises    ? ?Assessment/Plan  ?  ?PT Assessment Patient needs continued PT services  ?PT Problem List Decreased strength;Decreased activity tolerance;Decreased balance;Decreased mobility;Decreased knowledge of use of DME;Decreased safety awareness;Decreased knowledge of precautions;Cardiopulmonary status limiting activity;Pain ? ?   ?  ?PT Treatment Interventions DME instruction;Gait training;Stair training;Functional mobility training;Therapeutic activities;Therapeutic exercise;Balance training;Patient/family education   ? ?PT Goals (Current goals can be found in the Care Plan section)  ?Acute Rehab PT Goals ?Patient Stated Goal: Did  not specifically state ?PT Goal Formulation: With patient ?Time For Goal Achievement: 08/30/21 ?Potential to Achieve Goals: Good ? ?  ?Frequency Min 3X/week ?  ? ? ?Co-evaluation   ?  ?  ?  ?  ? ? ?  ?AM-PAC PT "6 Clicks" Mobility  ?Outcome Measure Help needed turning from your back to your side while in a flat bed without using bedrails?: A Kugel ?Help needed moving from lying on your back to sitting on the side of a flat bed without using bedrails?: A Vitanza ?Help needed moving to and from a bed to a chair (including a wheelchair)?: A Fung ?Help needed standing up from a chair using your arms (e.g., wheelchair or bedside chair)?: A Waibel ?Help needed to walk in hospital room?: A Prather ?Help needed climbing 3-5 steps with a railing? : A Devos ?6 Click Score: 18 ? ?  ?End of Session   ?Activity Tolerance: Patient limited by fatigue ?Patient left: in chair;with call bell/phone within reach;with chair alarm set ?Nurse Communication: Mobility status ?PT Visit Diagnosis: Unsteadiness on feet (R26.81);Muscle weakness (generalized) (M62.81) ?  ? ?Time: 1610-9604 ?PT Time Calculation (min) (ACUTE ONLY): 18 min ? ? ?Charges:   PT Evaluation ?$PT Eval Low Complexity: 1 Low ?  ?  ?   ? ? ?  Roney Marion, PT  ?Acute Rehabilitation Services ?Pager [phone] ?Office [phone] ? ? ?[place]ta [place] ?08/16/2021, 1:14 PM ? ?

## 2021-08-17 DIAGNOSIS — N185 Chronic kidney disease, stage 5: Secondary | ICD-10-CM

## 2021-08-17 DIAGNOSIS — D631 Anemia in chronic kidney disease: Secondary | ICD-10-CM

## 2021-08-17 DIAGNOSIS — N390 Urinary tract infection, site not specified: Secondary | ICD-10-CM

## 2021-08-17 LAB — CBC
HCT: 30.4 % — ABNORMAL LOW (ref 39.0–52.0)
Hemoglobin: 10.1 g/dL — ABNORMAL LOW (ref 13.0–17.0)
MCH: 29.9 pg (ref 26.0–34.0)
MCHC: 33.2 g/dL (ref 30.0–36.0)
MCV: 89.9 fL (ref 80.0–100.0)
Platelets: 212 10*3/uL (ref 150–400)
RBC: 3.38 MIL/uL — ABNORMAL LOW (ref 4.22–5.81)
RDW: 14 % (ref 11.5–15.5)
WBC: 17 10*3/uL — ABNORMAL HIGH (ref 4.0–10.5)
nRBC: 0.1 % (ref 0.0–0.2)

## 2021-08-17 LAB — BASIC METABOLIC PANEL
Anion gap: 11 (ref 5–15)
BUN: 60 mg/dL — ABNORMAL HIGH (ref 8–23)
CO2: 24 mmol/L (ref 22–32)
Calcium: 8.3 mg/dL — ABNORMAL LOW (ref 8.9–10.3)
Chloride: 109 mmol/L (ref 98–111)
Creatinine, Ser: 5.37 mg/dL — ABNORMAL HIGH (ref 0.61–1.24)
GFR, Estimated: 10 mL/min — ABNORMAL LOW (ref >60)
Glucose, Bld: 113 mg/dL — ABNORMAL HIGH (ref 70–99)
Potassium: 4 mmol/L (ref 3.5–5.1)
Sodium: 144 mmol/L (ref 135–145)

## 2021-08-17 LAB — GLUCOSE, CAPILLARY
Glucose-Capillary: 85 mg/dL (ref 70–99)
Glucose-Capillary: 172 mg/dL — ABNORMAL HIGH (ref 70–99)
Glucose-Capillary: 219 mg/dL — ABNORMAL HIGH (ref 70–99)
Glucose-Capillary: 287 mg/dL — ABNORMAL HIGH (ref 70–99)

## 2021-08-17 LAB — PROCALCITONIN: Procalcitonin: 0.95 ng/mL

## 2021-08-17 NOTE — Progress Notes (Signed)
PROGRESS NOTE    Paul Bond  IDP:[id number] DOB: 1940-10-05 DOA: 08/14/2021 [facility]    Chief Complaint  Patient presents with   Covid Fatigue    Brief Narrative:    Paul Bond is a 81 y.o. 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 He feels tired and weak and has  had poor PO intake. His daughter confirms this. She states he is still not eating well and is just weak and tired since hospital discharge. She doesn't think  he is safe to stay at home and needs placement.    Hospitalized on 08/05/21-08/10/21 for poor PO intake, weakness and malaise and ambulatory dysfunction due to covid infection. He intially tested positive on 2/21.  Given 5 day course of molnupiravir and decadron in hospital x 3 days.    No fever/chills, no headaches/dizziness/no chest pain or palpitations, no shortness of breath or cough, no stomach pain, N/V/D, no leg swelling.  Chronic catheter. No CVA tenderness.    His niece cooks his meals for him.   ER Course:  vitals: afebrile, bp: 131/98, HR: 102, RR: 20, oxygen: 99%RA Pertinent labs: wbc: 24.3, hgb: 10.2 (10-11), pct: .67, lactic acid wnl x 2, BC pending, BUN: 66, creatinine: 5.37,  CxR: no acute findings, minimal left basilar atelectasis  In ED given 500cc bolus.   Assessment & Plan:   Principal Problem:   Failure to thrive in adult Active Problems:   Leukocytosis   COVID-19 virus infection   CKD (chronic kidney disease) stage 5, GFR less than 15 ml/min (HCC)   Type 2 diabetes mellitus without complication, with long-term current use of insulin (HCC)   Chronic  indwelling Foley catheter   Anemia   Hypertension   Protein-calorie malnutrition, severe   Ambulatory dysfunction  Failure to thrive in adult/deconditioning 81 year old male with nearly 4 week history of progressive decline including severely decreased PO intake, weakness/fatigue/malaise, inability to perform ADLs and mild weight loss -multifactorial causes for FTT including CKD stage V, recent covid infection, age, debility, wife going to rehab and him at home alone, as well due to UTI. -PT/OT consulted, will need  SNF placement. -TSH, and B12 not low, vitamin D level is low, already on supplements UTI   UTI -Patient has a chronic Foley, he reports it has not been changed for at least 30 days, UA strongly positive. -Leukocytosis trending down which is reassuring -Continue with IV Rocephin , urine culture is unhelpful as growing multiple species. -Foley catheter exchange yesterday.   COVID-19 virus infection - Positive test on 2/21 with hospitalization from 2/25-3/2 for fatigue/ambulatory dysfunctin -completed 3 day course of decadron and 5 day course of molnupiravir -No hypoxia, not pneumonia, more than 10 days from initial infection,no need  for COVID isolation, will discontinue.  CKD (chronic kidney disease) stage 5, GFR less than 15 ml/min (HCC) At baseline and plans for HD through [place] I would think this could be a big contributing factor to his fatigue and other issues as well as covid/protein calorie malnutrition/age and debility  Avoid nephrotoxic drugs  Strict I/O trend   Type 2 diabetes mellitus  without complication, with long-term current use of insulin (HCC) A1c of 7.9 from 8/21. Will check again with fatigue/FTT complaints  Continue 8 units long acting insulin and SSI with accuchecks    Chronic indwelling Foley catheter Has had since 2021, daughter and patient unsure why this was placed Foley care , patient report last time Foley catheter was changed around 30 days ago,   -Foley catheter exchanged yesterday  Anemia due to CKD Baseline of 10-11, stable. Continue to monitor    Hypertension Continue cardizem and hydralazine    Protein-calorie malnutrition, severe FTT and protein calorie malnutrition Nutrition consult    DVT prophylaxis: Heparin Code Status: Full Family Communication:  Disposition:   Status is: Observation The patient will require care spanning > 2 midnights and should be moved to inpatient because: week, unsafe, will need SNF placement, and further treatment for his   Consultants:  None    Subjective:  Patient reports generalized weakness, facility, he denies any chest pain, fever or chills.  Objective: Vitals:   08/17/21 0311 08/17/21 0747 08/17/21 1202 08/17/21 1602  BP: (!) 150/62 (!) 161/67 (!) 167/61 128/66  Pulse: 71 66 81 79  Resp: '14 16 18 18  '$ Temp: 98.9 F (37.2 C) 97.7 F (36.5 C) 97.6 F (36.4 C) 98 F (36.7 C)  TempSrc: Oral Oral Oral Oral  SpO2: 100% 95%    Weight:      Height:        Intake/Output Summary (Last 24 hours) at 08/17/2021 1605 Last data filed at 08/16/2021 1800 Gross per 24 hour  Intake 240 ml  Output --  Net 240 ml   Filed Weights   08/14/21 2237  Weight: 81 kg    Examination:  Awake Alert, Oriented X 3,frail Symmetrical Chest wall movement, Good air movement bilaterally, CTAB RRR,No Gallops,Rubs or new Murmurs, No Parasternal Heave +ve B.Sounds, Abd Soft, No tenderness, No rebound - guarding or rigidity. No Cyanosis, Clubbing or edema, No new Rash or bruise      Data Reviewed: I have personally reviewed following labs and imaging studies  CBC: Recent Labs  Lab 08/14/21 2259 08/15/21 1426 08/16/21 0452 08/17/21 0251  WBC 24.3* 27.7* 23.2* 17.0*  NEUTROABS 22.4*  --  19.3*  --   HGB 10.2* 10.0* 10.2* 10.1*  HCT 32.5* 32.1* 31.2* 30.4*  MCV 92.6 93.3 91.0 89.9  PLT 260 247 237 937    Basic Metabolic Panel: Recent Labs  Lab 08/14/21 2259 08/15/21 1426  08/16/21 0452 08/17/21 0251  NA 143  --  143 144  K 4.4  --  4.5 4.0  CL 110  --  108 109  CO2 21*  --  22 24  GLUCOSE 191*  --  109* 113*  BUN 66*  --  63* 60*  CREATININE 5.37* 5.93* 5.37* 5.37*  CALCIUM 8.3*  --  8.3* 8.3*    GFR: Estimated Creatinine Clearance: 11.2 mL/min (A) (by C-G formula based on SCr of 5.37 mg/dL (H)).  Liver Function Tests: Recent Labs  Lab 08/14/21 2259  AST 13*  ALT 20  ALKPHOS 60  BILITOT 0.6  PROT 6.4*  ALBUMIN 2.2*    CBG: Recent Labs  Lab 08/16/21 1624 08/16/21 2001 08/17/21 0749 08/17/21 1221 08/17/21 1603  GLUCAP 194* 110* 85 172* 219*     Recent Results (from the past 240 hour(s))  Culture, blood (routine x 2)     Status: None (Preliminary result)   Collection Time: 08/15/21  9:33 AM  Specimen: BLOOD  Result Value Ref Range Status   Specimen Description BLOOD SITE NOT SPECIFIED  Final   Special Requests   Final    BOTTLES DRAWN AEROBIC AND ANAEROBIC Blood Culture results may not be optimal due to an excessive volume of blood received in culture bottles   Culture   Final    NO GROWTH 2 DAYS Performed at [facility], [place] [address], [place], [place] [address]    Report Status PENDING  Incomplete  Culture, blood (routine x 2)     Status: None (Preliminary result)   Collection Time: 08/15/21  9:58 AM   Specimen: BLOOD  Result Value Ref Range Status   Specimen Description BLOOD SITE NOT SPECIFIED  Final   Special Requests   Final    BOTTLES DRAWN AEROBIC AND ANAEROBIC Blood Culture results may not be optimal due to an inadequate volume of blood received in culture bottles   Culture   Final    NO GROWTH 2 DAYS Performed at [facility], [place] [address], [place], [place] [address]    Report Status PENDING  Incomplete  Urine Culture     Status: Abnormal   Collection Time: 08/15/21  1:52 PM   Specimen: Urine, Catheterized  Result Value Ref Range Status   Specimen Description URINE, CATHETERIZED  Final    Special Requests   Final    NONE Performed at [facility], [place] [address] [place], [place] [address]    Culture MULTIPLE SPECIES PRESENT, SUGGEST RECOLLECTION (A)  Final   Report Status 08/16/2021 FINAL  Final         Radiology Studies: No results found.      Scheduled Meds:  Chlorhexidine Gluconate Cloth  6 each Topical Daily   cholecalciferol  2,000 Units Oral Daily   diltiazem  240 mg Oral Daily   feeding supplement  237 mL Oral BID BM   heparin  5,000 Units Subcutaneous Q8H   hydrALAZINE  25 mg Oral TID   insulin aspart  0-6 Units Subcutaneous TID WC   insulin glargine-yfgn  8 Units Subcutaneous Daily   latanoprost  1 drop Both Eyes QPM   multivitamin  1 tablet Oral QHS   sodium bicarbonate  1,300 mg Oral BID   sodium chloride flush  3 mL Intravenous Q12H   Continuous Infusions:  sodium chloride     cefTRIAXone (ROCEPHIN)  IV 1 g (08/17/21 1330)     LOS: 1 day      Phillips Climes, MD [facility]   To contact the attending provider between 7A-7P or the covering provider during after hours 7P-7A, please log into the web site [email or web address] and access using universal [place] password for that web site. If you do not have the password, please call the hospital operator.  08/17/2021, 4:05 PM

## 2021-08-17 NOTE — TOC Progression Note (Addendum)
Transition of Care (TOC) - Progression Note  ? ? ?Patient Details  ?Name: Paul Bond ?MRN: [id number] ?Date of Birth: July 11, 1940 ? ?Transition of Care (TOC) CM/SW Contact  ?[place], LCSW ?Phone Number: ?08/17/2021, 12:05 PM ? ?Clinical Narrative:    ? ?CSW called pt daughter and provided SNF offers. Her first choice was [facility]. CSW contacted [facility]; they can accept pt tomorrow. CSW updated daughter. No covid test needed. CSW submitted insurance auth request in [place].  ? ?1510: CSW received voicemail requesting updated PT note. Navi reviewer stated in voicemail that ambulation was not tested in most recent PT note and that based on pt's ability demonstrated in last PT note, auth would have to be reviewed by medical director and may not meet criteria for SNF approval.   ? ?CSW notified Acute Rehab front office and requested pt be seen today or early tomorrow.  ? ?1629: CSW uploaded new PT note to [facility] for review.  ? ?Expected Discharge Plan: [place] ?Barriers to Discharge: Insurance Authorization ? ?Expected Discharge Plan and Services ?Expected Discharge Plan: [place] ?In-house Referral: Clinical Social Work ?  ?Post Acute Care Choice: [place] ?Living arrangements for the past 2 months: [place] ?                ?  ?  ?  ?  ?  ?  ?  ?  ?  ?  ? ? ?Social Determinants of Health (SDOH) Interventions ?  ? ?Readmission Risk Interventions ?Readmission Risk Prevention Plan 08/07/2021  ?Transportation Screening Complete  ?PCP or Specialist Appt within 3-5 Days Complete  ?[place] or Home Care Consult Complete  ?Social Work Consult for [place] Planning/Counseling Complete  ?Palliative Care Screening Not Applicable  ?Medication Review Press photographer) Complete  ?Some recent data might be hidden  ? ? ?

## 2021-08-17 NOTE — Progress Notes (Signed)
Physical Therapy Treatment ?Patient Details ?Name: Paul Bond ?MRN: [id number] ?DOB: Oct 16, 1940 ?Today's Date: 08/17/2021 ? ? ?History of Present Illness Paul Bond is a 81 y.o. male presenting with progressive fatigue and weakness after recent discharge/COVID-19 infection (2/25-3/2). Pt with medical history significant of DM; HTN; CVA; CKD5 s/p remote L nephrectomy, and nephrolithiasis s/p stent placement multiple times ? ?  ?PT Comments  ? ? Patient up in chair on arrival, but asleep. Easily awakened and agreed to progress mobility and attempt ambulation. Pt was able to ambulate 74 ft with RW and min assist (assist for steering/turning RW) with cues for safe, proper use. Anticipate pt will make steady progress in regaining his strength and stamina as he appears to be very motivated.  ?   ?Recommendations for follow up therapy are one component of a multi-disciplinary discharge planning process, led by the attending physician.  Recommendations may be updated based on patient status, additional functional criteria and insurance authorization. ? ?Follow Up Recommendations ? Skilled nursing-short term rehab (<3 hours/day) ?  ?  ?Assistance Recommended at Discharge Set up Supervision/Assistance  ?Patient can return home with the following Assistance with cooking/housework;A Pietsch help with walking and/or transfers;A Meland help with bathing/dressing/bathroom;Help with stairs or ramp for entrance ?  ?Equipment Recommendations ? None recommended by PT  ?  ?Recommendations for Other Services   ? ? ?  ?Precautions / Restrictions Precautions ?Precautions: Fall;Other (comment) ?Precaution Comments: monitor HR ?Restrictions ?Weight Bearing Restrictions: No  ?  ? ?Mobility ? Bed Mobility ?  ?  ?  ?  ?  ?  ?  ?  ?  ? ?Transfers ?Overall transfer level: Needs assistance ?Equipment used: Rolling walker (2 wheels) ?Transfers: Sit to/from Stand ?Sit to Stand: Min guard ?  ?  ?  ?  ?  ?General transfer comment: Slow rise, Mingurd  for safety ?  ? ?Ambulation/Gait ?Ambulation/Gait assistance: Min assist ?Gait Distance (Feet): 74 Feet ?Assistive device: Rolling walker (2 wheels) ?Gait Pattern/deviations: Step-through pattern, Decreased stride length, Trunk flexed ?  ?Gait velocity interpretation: 1.31 - 2.62 ft/sec, indicative of limited community ambulator ?  ?General Gait Details: vc for standing upright and proximity to RW (he pushes too far ahead); assist with turning RW ? ? ?Stairs ?  ?  ?  ?  ?  ? ? ?Wheelchair Mobility ?  ? ?Modified Rankin (Stroke Patients Only) ?  ? ? ?  ?Balance Overall balance assessment: Needs assistance, Mild deficits observed, not formally tested ?Sitting-balance support: Feet supported ?Sitting balance-[name] Scale: Normal ?  ?  ?Standing balance support: During functional activity ?Standing balance-[name] Scale: Fair ?Standing balance comment: dynamically requires bil UE support via RW at this time ?  ?  ?  ?  ?  ?  ?  ?  ?  ?  ?  ?  ? ?  ?Cognition Arousal/Alertness: Awake/alert ?Behavior During Therapy: [facility] for tasks assessed/performed ?Overall Cognitive Status: No family/caregiver present to determine baseline cognitive functioning ?Area of Impairment: Problem solving ?  ?  ?  ?  ?  ?  ?  ?  ?  ?  ?  ?  ?  ?  ?Problem Solving: Requires verbal cues ?General Comments: despite cues for which way to turn due to lines, pt again needed cues the second time we turned; ?  ?  ? ?  ?Exercises   ? ?  ?General Comments   ?  ?  ? ?Pertinent Vitals/Pain Pain Assessment ?Pain Assessment: No/denies pain  ? ? ?  Home Living   ?  ?  ?  ?  ?  ?  ?  ?  ?  ?   ?  ?Prior Function    ?  ?  ?   ? ?PT Goals (current goals can now be found in the care plan section) Acute Rehab PT Goals ?Patient Stated Goal: agrees he wants to walk and get stronger ?Time For Goal Achievement: 08/30/21 ?Potential to Achieve Goals: Good ?Progress towards PT goals: Progressing toward goals ? ?  ?Frequency ? ? ? Min 3X/week ? ? ? ?  ?PT Plan Current plan  remains appropriate  ? ? ?Co-evaluation   ?  ?  ?  ?  ? ?  ?AM-PAC PT "6 Clicks" Mobility   ?Outcome Measure ? Help needed turning from your back to your side while in a flat bed without using bedrails?: A Heberlein ?Help needed moving from lying on your back to sitting on the side of a flat bed without using bedrails?: A Zaborowski ?Help needed moving to and from a bed to a chair (including a wheelchair)?: A Lueck ?Help needed standing up from a chair using your arms (e.g., wheelchair or bedside chair)?: A Nyce ?Help needed to walk in hospital room?: A Folger ?Help needed climbing 3-5 steps with a railing? : A Lot ?6 Click Score: 17 ? ?  ?End of Session Equipment Utilized During Treatment: Gait belt ?Activity Tolerance: Patient tolerated treatment well ?Patient left: in chair;with call bell/phone within reach;with chair alarm set ?Nurse Communication: Mobility status ?PT Visit Diagnosis: Unsteadiness on feet (R26.81);Muscle weakness (generalized) (M62.81) ?  ? ? ?Time: 8413-2440 ?PT Time Calculation (min) (ACUTE ONLY): 15 min ? ?Charges:  $Gait Training: 8-22 mins          ?          ? ? ?Arby Barrette, PT ?Acute Rehabilitation Services  ?Pager [phone] ?Office [phone] ? ? ? ?Jeanie Cooks Jonathen Rathman ?08/17/2021, 4:19 PM ? ?

## 2021-08-18 LAB — GLUCOSE, CAPILLARY
Glucose-Capillary: 96 mg/dL (ref 70–99)
Glucose-Capillary: 157 mg/dL — ABNORMAL HIGH (ref 70–99)
Glucose-Capillary: 121 mg/dL — ABNORMAL HIGH (ref 70–99)
Glucose-Capillary: 194 mg/dL — ABNORMAL HIGH (ref 70–99)

## 2021-08-18 MED ORDER — CEPHALEXIN 250 MG PO CAPS
250.0000 mg | ORAL_CAPSULE | Freq: Two times a day (BID) | ORAL | Status: DC
  Administered 2021-08-19: 250 mg via ORAL
  Filled 2021-08-18 (×2): qty 1

## 2021-08-18 MED ORDER — HYDRALAZINE HCL 20 MG/ML IJ SOLN: 10.0000 mg | Freq: Four times a day (QID) | INTRAMUSCULAR | Status: DC | PRN

## 2021-08-18 NOTE — TOC Progression Note (Addendum)
Transition of Care (TOC) - Progression Note  ? ? ?Patient Details  ?Name: Paul Bond ?MRN: [id number] ?Date of Birth: [birth year] ? ?Transition of Care (TOC) CM/SW Contact  ?[place], LCSW ?Phone Number: ?08/18/2021, 9:10 AM ? ?Clinical Narrative:    ? ?Received voicemail from [facility]. Requesting any additional notes that indicate medical need for SNF as she will likely still need to submit the auth to their medical director for approval. CSW called back, informed [employer] rep. that CS submitted all current progress notes. CSW is informed that [facility] was sent to [employer] for approval.  ? ?1200: CSW received voicemail from [place]. A peer to peer is needed. Deadline is 4pm today. Call [phone] and then option 5. ?CSW notified MD.  ? ?1400: MD completed peer to peer. [facility] medical director reported she only had therapy notes from 3/08 and requested note from 3/09 to be uploaded to portal before making a decision on auth. CSW called [employer] and explained situation that [employer] is requesting note that has already been uploaded. CSW confirmed with navi rep that document was uploaded yesterday and that medical director would receive it.  ? ?Expected Discharge Plan: [place] ?Barriers to Discharge: Insurance Authorization ? ?Expected Discharge Plan and Services ?Expected Discharge Plan: [place] ?In-house Referral: Clinical Social Work ?  ?Post Acute Care Choice: [place] ?Living arrangements for the past 2 months: [place] ?                ?  ?  ?  ?  ?  ?  ?  ?  ?  ?  ? ? ?Social Determinants of Health (SDOH) Interventions ?  ? ?Readmission Risk Interventions ?Readmission Risk Prevention Plan 08/07/2021  ?Transportation Screening Complete  ?PCP or Specialist Appt within 3-5 Days Complete  ?[place] or Home Care Consult Complete  ?Social Work Consult for [place] Planning/Counseling Complete  ?Palliative Care Screening Not Applicable  ?Medication Review  Press photographer) Complete  ?Some recent data might be hidden  ? ? ?

## 2021-08-18 NOTE — Progress Notes (Signed)
Occupational Therapy Treatment ?Patient Details ?Name: Paul Bond ?MRN: [id number] ?DOB: 11-14-1940 ?Today's Date: 08/18/2021 ? ? ?History of present illness Paul Bond is a 81 y.o. male presenting with progressive fatigue and weakness after recent discharge/COVID-19 infection (2/25-3/2). Pt with medical history significant of DM; HTN; CVA; CKD5 s/p remote L nephrectomy, and nephrolithiasis s/p stent placement multiple times ?  ?OT comments ? Patient progressing and showed improved ability to perform 10 min of functional standing activities including reaching high and low, walker safety when negotiating drawers/doors and while transporting clothing from closet to chair, compared to previous session. Patient remains limited by mild cognitive deficits, generalized weakness and decreased activity tolerance along with deficits noted below. Pt continues to demonstrate good rehab potential and would benefit from continued skilled OT to increase safety and independence with ADLs and functional transfers to allow pt to return home safely and reduce caregiver burden and fall risk. ?  ? ?Recommendations for follow up therapy are one component of a multi-disciplinary discharge planning process, led by the attending physician.  Recommendations may be updated based on patient status, additional functional criteria and insurance authorization. ?   ?Follow Up Recommendations ? Home health OT  ?  ?Assistance Recommended at Discharge Intermittent Supervision/Assistance  ?Patient can return home with the following ? A Paul Bond help with walking and/or transfers;A Paul Bond help with bathing/dressing/bathroom;Assistance with cooking/housework;Assist for transportation;Help with stairs or ramp for entrance;Direct supervision/assist for medications management;Direct supervision/assist for financial management ?  ?Equipment Recommendations ? None recommended by OT  ?  ?Recommendations for Other Services   ? ?  ?Precautions / Restrictions  Precautions ?Precautions: Fall;Other (comment) ?Precaution Comments: monitor HR ?Restrictions ?Weight Bearing Restrictions: No  ? ? ?  ? ?Mobility Bed Mobility ?Overal bed mobility: Modified Independent ?Bed Mobility: Supine to Sit ?  ?  ?Supine to sit: Modified independent (Device/Increase time) ?  ?  ?  ?  ? ?Transfers ?Overall transfer level:  (see ADLs) ?  ?  ?  ?  ?  ?  ?  ?  ?  ?  ?  ?Balance Overall balance assessment: Mild deficits observed, not formally tested ?  ?Sitting balance-[name] Scale: Normal ?  ?  ?Standing balance support: During functional activity ?Standing balance-[name] Scale: Fair ?  ?  ?  ?  ?  ?  ?  ?  ?  ?  ?  ?  ?   ? ?ADL either performed or assessed with clinical judgement  ? ?ADL Overall ADL's : Needs assistance/impaired ?  ?  ?Grooming: Oral care;Wash/dry face;Wash/dry hands;Standing;Supervision/safety ?Grooming Details (indicate cue type and reason): standing at sink, no cues needed for safe positioning of RW ?  ?  ?  ?  ?  ?  ?  ?  ?Toilet Transfer: Supervision/safety;Ambulation;Rolling walker (2 wheels) ?  ?  ?  ?  ?  ?Functional mobility during ADLs: Supervision/safety;Rolling walker (2 wheels) ?General ADL Comments: Pt worked on OT goal of gathering items in room for ADLs. Pt educated on safe approach to cabinet/regular doors, drawers and ovens with RW. Pt able to demonstrate back current approach and open both closet dorrs and drawers at eash level with supervision. Pt able to reach down to bottom of closert x2 to retrieve linens and reach high for placement with Min guard when reaching to lowest pooint, otherwise supervision. Pt educated on how to transfer clothing from closet safely to area where pt dresses by drapping on RW. Pt shown how to secure with  hand to avoid linens slipping to floor. Pt then demonstrated understanding by drapping chuck pad on RW and arranging on his recliner prior to sitting. ?  ? ?Extremity/Trunk Assessment Upper Extremity Assessment ?Upper Extremity  Assessment: Overall WFL for tasks assessed ?  ?Lower Extremity Assessment ?Lower Extremity Assessment: Generalized weakness ?  ?  ?  ? ?Vision Baseline Vision/History: 1 Wears glasses ?Ability to See in Adequate Light: 0 Adequate ?Patient Visual Report: No change from baseline ?Vision Assessment?: No apparent visual deficits ?  ?Perception   ?  ?Praxis   ?  ? ?Cognition Arousal/Alertness: Awake/alert ?Behavior During Therapy: [facility] for tasks assessed/performed ?Overall Cognitive Status: Within Functional Limits for tasks assessed ?  ?  ?  ?  ?  ?  ?  ?  ?  ?  ?  ?  ?  ?  ?  ?Problem Solving: Slow processing ?General Comments: A&Ox4. Can have delayed responses at times. ?  ?  ?   ?Exercises   ? ?  ?Shoulder Instructions   ? ? ?  ?General Comments    ? ? ?Pertinent Vitals/ Pain       Pain Assessment ?Pain Assessment: No/denies pain ? ?   ?  ?  ?  ?  ?  ?  ?  ?  ?  ?  ?  ?  ?  ?  ?  ?  ?  ?  ? ?  ?    ?  ?  ?  ?   ? ?Frequency ? Min 2X/week  ? ? ? ? ?  ?Progress Toward Goals ? ?OT Goals(current goals can now be found in the care plan section) ? Progress towards OT goals: Progressing toward goals ? ?Acute Rehab OT Goals ?Patient Stated Goal: To go home ?OT Goal Formulation: With patient ?Time For Goal Achievement: 08/30/21 ?Potential to Achieve Goals: Good  ?Plan Discharge plan remains appropriate   ? ?Co-evaluation ? ? ?   ?  ?  ?  ?  ? ?  ?AM-PAC OT "6 Clicks" Daily Activity     ?Outcome Measure ? ? Help from another person eating meals?: None ?Help from another person taking care of personal grooming?: A [place] ?Help from another person toileting, which includes using toliet, bedpan, or urinal?: A Paul Bond ?Help from another person bathing (including washing, rinsing, drying)?: A Paul Bond ?Help from another person to put on and taking off regular upper body clothing?: A Paul Bond ?Help from another person to put on and taking off regular lower body clothing?: A Paul Bond ?6 Click Score: 19 ? ?  ?End of Session Equipment Utilized  During Treatment: Rolling walker (2 wheels);Gait belt ? ?OT Visit Diagnosis: Unsteadiness on feet (R26.81);Other abnormalities of gait and mobility (R26.89) ?  ?Activity Tolerance Patient tolerated treatment well ?  ?Patient Left in chair;with call bell/phone within reach;with chair alarm set ?  ?Nurse Communication Other (comment) (Cleared OT to see) ?  ? ?   ? ?Time: 8675-4492 ?OT Time Calculation (min): 25 min ? ?Charges: OT General Charges ?$OT Visit: 1 Visit ?OT Treatments ?$Self Care/Home Management : 8-22 mins ?$Therapeutic Activity: 8-22 mins ? ?Anderson [place], OT ?Acute Rehab Services ?Office: [phone] ?08/18/2021 ? ? ?Paul Bond ?08/18/2021, 10:08 AM ?

## 2021-08-18 NOTE — Care Management Important Message (Signed)
Important Message ? ?Patient Details  ?Name: Paul Bond ?MRN: [id number] ?Date of Birth: [birth year] ? ? ?Medicare Important Message Given:  Yes ? ? ? ? ?Lam Mccubbins ?08/18/2021, 2:26 PM ?

## 2021-08-18 NOTE — Progress Notes (Signed)
PROGRESS NOTE    Paul Bond  RWE:[id number] DOB: 1940/12/21 DOA: 08/14/2021 [facility]    Chief Complaint  Patient presents with   Covid Fatigue    Brief Narrative:    Paul Bond is a 81 y.o. 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 He feels tired and weak and has  had poor PO intake. His daughter confirms this. She states he is still not eating well and is just weak and tired since hospital discharge. She doesn't think  he is safe to stay at home and needs placement.    Hospitalized on 08/05/21-08/10/21 for poor PO intake, weakness and malaise and ambulatory dysfunction due to covid infection. He intially tested positive on 2/21.  Given 5 day course of molnupiravir and decadron in hospital x 3 days.    No fever/chills, no headaches/dizziness/no chest pain or palpitations, no shortness of breath or cough, no stomach pain, N/V/D, no leg swelling.  Chronic catheter. No CVA tenderness.    His niece cooks his meals for him.   ER Course:  vitals: afebrile, bp: 131/98, HR: 102, RR: 20, oxygen: 99%RA Pertinent labs: wbc: 24.3, hgb: 10.2 (10-11), pct: .67, lactic acid wnl x 2, BC pending, BUN: 66, creatinine: 5.37,  CxR: no acute findings, minimal left basilar atelectasis  In ED given 500cc bolus.   Assessment & Plan:   Principal Problem:   Failure to thrive in adult Active Problems:   Leukocytosis   COVID-19 virus infection   CKD (chronic kidney disease) stage 5, GFR less than 15 ml/min (HCC)   Type 2 diabetes mellitus without complication, with long-term current use of insulin (HCC)   Chronic  indwelling Foley catheter   Anemia   Hypertension   Protein-calorie malnutrition, severe   Ambulatory dysfunction  Failure to thrive in adult/deconditioning 81 year old male with nearly 4 week history of progressive decline including severely decreased PO intake, weakness/fatigue/malaise, inability to perform ADLs and mild weight loss -multifactorial causes for FTT including CKD stage V, recent covid infection, age, debility, wife going to rehab and him at home alone, as well due to UTI. -PT/OT consulted, will need  SNF placement. -TSH, and B12 not low, vitamin D level is low, already on supplements UTI   UTI -Patient has a chronic Foley, he reports it has not been changed for at least 30 days, UA strongly positive. -Leukocytosis trending down which is reassuring -Continue with IV Rocephin , urine culture is unhelpful as growing multiple species. -Foley catheter exchange 3/8 -Will change Rocephin to Keflex and treat for total of 7 days.   COVID-19 virus infection - Positive test on 2/21 with hospitalization from 2/25-3/2 for fatigue/ambulatory dysfunctin -completed 3 day course of decadron and 5 day course of molnupiravir -No hypoxia, not pneumonia, more than 10 days from initial infection,no need  for COVID isolation, will discontinue.  CKD (chronic kidney disease) stage 5, GFR less than 15 ml/min (HCC) At baseline and plans for HD through [place] I would think this could be a big contributing factor to his fatigue and other issues as well as covid/protein calorie malnutrition/age and debility  Avoid  nephrotoxic drugs  Strict I/O trend   Type 2 diabetes mellitus without complication, with long-term current use of insulin (HCC) A1c of 7.9 from 8/21. Will check again with fatigue/FTT complaints  Continue 8 units long acting insulin and SSI with accuchecks    Chronic indwelling Foley catheter Has had since 2021, daughter and patient unsure why this was placed Foley care , patient report  last time Foley catheter was changed around 30 days ago,  -Foley catheter exchanged yesterday  Anemia due to CKD Baseline of 10-11, stable. Continue to monitor    Hypertension Continue cardizem and hydralazine    Protein-calorie malnutrition, severe FTT and protein calorie malnutrition Nutrition consult    DVT prophylaxis: Heparin Code Status: Full Family Communication:  Disposition:   Status is: Observation The patient will require care spanning > 2 midnights and should be moved to inpatient because: week, unsafe, will need SNF placement, awaiting insurance authorization   Consultants:  None    Subjective:  No chest pain, no shortness of breath  Objective: Vitals:   08/17/21 2321 08/18/21 0326 08/18/21 0743 08/18/21 1150  BP: (!) 155/54 (!) 161/60 (!) 163/72 (!) 121/53  Pulse: 72 70 76 82  Resp: '18 18 17 17  '$ Temp: 98.5 F (36.9 C) 98.7 F (37.1 C) 97.8 F (36.6 C) 98 F (36.7 C)  TempSrc: Oral Axillary Oral Oral  SpO2:   97% 97%  Weight:      Height:        Intake/Output Summary (Last 24 hours) at 08/18/2021 1535 Last data filed at 08/18/2021 0341 Gross per 24 hour  Intake 200 ml  Output 700 ml  Net -500 ml   Filed Weights   08/14/21 2237  Weight: 81 kg    Examination:  Awake Alert, Oriented X 3,frail Symmetrical Chest wall movement, Good air movement bilaterally, CTAB RRR,No Gallops,Rubs or new Murmurs, No Parasternal Heave +ve B.Sounds, Abd Soft, No tenderness, No rebound - guarding or rigidity. No Cyanosis, Clubbing or edema, No new Rash or bruise       Data Reviewed: I have personally reviewed following labs and imaging studies  CBC: Recent Labs  Lab 08/14/21 2259 08/15/21 1426 08/16/21 0452 08/17/21 0251  WBC 24.3* 27.7* 23.2* 17.0*  NEUTROABS 22.4*  --  19.3*  --   HGB 10.2* 10.0* 10.2* 10.1*  HCT 32.5* 32.1* 31.2* 30.4*  MCV 92.6 93.3 91.0 89.9  PLT 260 247 237 962    Basic Metabolic Panel: Recent Labs  Lab 08/14/21 2259  08/15/21 1426 08/16/21 0452 08/17/21 0251  NA 143  --  143 144  K 4.4  --  4.5 4.0  CL 110  --  108 109  CO2 21*  --  22 24  GLUCOSE 191*  --  109* 113*  BUN 66*  --  63* 60*  CREATININE 5.37* 5.93* 5.37* 5.37*  CALCIUM 8.3*  --  8.3* 8.3*    GFR: Estimated Creatinine Clearance: 11.2 mL/min (A) (by C-G formula based on SCr of 5.37 mg/dL (H)).  Liver Function Tests: Recent Labs  Lab 08/14/21 2259  AST 13*  ALT 20  ALKPHOS 60  BILITOT 0.6  PROT 6.4*  ALBUMIN 2.2*    CBG: Recent Labs  Lab 08/17/21 1221 08/17/21 1603 08/17/21 2004 08/18/21 0743 08/18/21 1154  GLUCAP 172* 219* 287* 96 157*     Recent Results (from the past 240 hour(s))  Culture, blood (routine x 2)     Status: None (Preliminary result)   Collection  Time: 08/15/21  9:33 AM   Specimen: BLOOD  Result Value Ref Range Status   Specimen Description BLOOD SITE NOT SPECIFIED  Final   Special Requests   Final    BOTTLES DRAWN AEROBIC AND ANAEROBIC Blood Culture results may not be optimal due to an excessive volume of blood received in culture bottles   Culture   Final    NO GROWTH 3 DAYS Performed at [facility], [place] [address] [place], [place] [address]    Report Status PENDING  Incomplete  Culture, blood (routine x 2)     Status: None (Preliminary result)   Collection Time: 08/15/21  9:58 AM   Specimen: BLOOD  Result Value Ref Range Status   Specimen Description BLOOD SITE NOT SPECIFIED  Final   Special Requests   Final    BOTTLES DRAWN AEROBIC AND ANAEROBIC Blood Culture results may not be optimal due to an inadequate volume of blood received in culture bottles   Culture   Final    NO GROWTH 3 DAYS Performed at [facility], [place] [address], [place], [place] [address]    Report Status PENDING  Incomplete  Urine Culture     Status: Abnormal   Collection Time: 08/15/21  1:52 PM   Specimen: Urine, Catheterized  Result Value Ref Range Status   Specimen Description URINE,  CATHETERIZED  Final   Special Requests   Final    NONE Performed at [facility], [place] [address], [place], [place] [address]    Culture MULTIPLE SPECIES PRESENT, SUGGEST RECOLLECTION (A)  Final   Report Status 08/16/2021 FINAL  Final         Radiology Studies: No results found.      Scheduled Meds:  Chlorhexidine Gluconate Cloth  6 each Topical Daily   cholecalciferol  2,000 Units Oral Daily   diltiazem  240 mg Oral Daily   feeding supplement  237 mL Oral BID BM   heparin  5,000 Units Subcutaneous Q8H   hydrALAZINE  25 mg Oral TID   insulin aspart  0-6 Units Subcutaneous TID WC   insulin glargine-yfgn  8 Units Subcutaneous Daily   latanoprost  1 drop Both Eyes QPM   multivitamin  1 tablet Oral QHS   sodium bicarbonate  1,300 mg Oral BID   sodium chloride flush  3 mL Intravenous Q12H   Continuous Infusions:  sodium chloride     cefTRIAXone (ROCEPHIN)  IV 1 g (08/18/21 1228)     LOS: 2 days      Phillips Climes, MD [facility]   To contact the attending provider between 7A-7P or the covering provider during after hours 7P-7A, please log into the web site [email or web address] and access using universal [place] password for that web site. If you do not have the password, please call the hospital operator.  08/18/2021, 3:35 PM

## 2021-08-18 NOTE — TOC Progression Note (Signed)
Transition of Care (TOC) - Progression Note  ? ? ?Patient Details  ?Name: Raysean Graumann Row ?MRN: [id number] ?Date of Birth: 1941/06/02 ? ?Transition of Care (TOC) CM/SW Contact  ?[place], LCSW ?Phone Number: ?08/18/2021, 4:36 PM ? ?Clinical Narrative:    ? ?[place] cutoff at 1530; [facility] is not approved yet by that time so pt cannot admit today. Pt can DC to [facility] tomorrow if [facility] is approved.  ? ?Expected Discharge Plan: [place] ?Barriers to Discharge: Insurance Authorization ? ?Expected Discharge Plan and Services ?Expected Discharge Plan: [place] ?In-house Referral: Clinical Social Work ?  ?Post Acute Care Choice: [place] ?Living arrangements for the past 2 months: [place] ?                ?  ?  ?  ?  ?  ?  ?  ?  ?  ?  ? ? ?Social Determinants of Health (SDOH) Interventions ?  ? ?Readmission Risk Interventions ?Readmission Risk Prevention Plan 08/07/2021  ?Transportation Screening Complete  ?PCP or Specialist Appt within 3-5 Days Complete  ?[place] or Home Care Consult Complete  ?Social Work Consult for [place] Planning/Counseling Complete  ?Palliative Care Screening Not Applicable  ?Medication Review Press photographer) Complete  ?Some recent data might be hidden  ? ? ?

## 2021-08-19 LAB — GLUCOSE, CAPILLARY
Glucose-Capillary: 98 mg/dL (ref 70–99)
Glucose-Capillary: 124 mg/dL — ABNORMAL HIGH (ref 70–99)

## 2021-08-19 MED ORDER — INSULIN ASPART 100 UNIT/ML IJ SOLN: 0.0000 [IU] | Freq: Three times a day (TID) | INTRAMUSCULAR | 11 refills | Status: DC

## 2021-08-19 MED ORDER — CEPHALEXIN 250 MG PO CAPS: 250.0000 mg | ORAL_CAPSULE | Freq: Two times a day (BID) | ORAL | 0 refills | Status: AC

## 2021-08-19 NOTE — Discharge Summary (Signed)
Physician Discharge Summary  COPELAND NEISEN VFI:[id number] DOB: 25-Jul-1940 DOA: 08/14/2021  PCP: [facility]  Admit date: 08/14/2021 Discharge date: 08/19/2021  Admitted From: Home Disposition:  SNF  Recommendations for Outpatient Follow-up:  Please check CBC, CMP in 3 days Please change foley catheter every 30 days, last was done on 08/16/2020    Discharge Condition:Stable CODE STATUS:FULL Diet recommendation: Heart Healthy   Failure to thrive in adult/deconditioning 81 year old male with nearly 4 week history of progressive decline including severely decreased PO intake, weakness/fatigue/malaise, inability to perform ADLs and mild weight loss -multifactorial causes for FTT including CKD stage V, recent covid infection, age, debility, wife going to rehab and him at home alone, as well due to UTI. -PT/OT consulted, will need  SNF placement. -TSH, and B12 not low, vitamin D level is low, already on supplements UTI   UTI -Patient has a chronic Foley, he reports it has not been changed for at least 30 days, UA strongly positive. -Leukocytosis trending down which is reassuring -Continue with IV Rocephin , urine culture is unhelpful as growing multiple species. -Foley catheter exchange 3/8 -Will change Rocephin to Keflex and treat for total of 7 days.  Will need another 3 days as an outpatient.   COVID-19 virus infection - Positive test on 2/21 with hospitalization from 2/25-3/2 for fatigue/ambulatory dysfunctin -completed 3 day course of decadron and 5 day course of molnupiravir -No hypoxia, not pneumonia, more than 10 days from initial infection,no need  for COVID isolation, will discontinue.   CKD (chronic kidney disease) stage 5, GFR less than 15 ml/min (HCC) At baseline and plans for HD through [place] I would think this could be a big contributing factor to his fatigue and other issues as well as covid/protein calorie malnutrition/age and debility  Avoid nephrotoxic drugs   Strict I/O trend   Type 2 diabetes mellitus without complication, with long-term current use of insulin (HCC) Continue 8 units long acting insulin and SSI    Chronic indwelling Foley catheter Has had since 2021, daughter and patient unsure why this was placed Foley care , patient report last time Foley catheter was changed around 30 days ago,  -Foley catheter exchanged 3/7  Anemia due to CKD Baseline of 10-11, stable. Continue to monitor    Hypertension Continue cardizem and hydralazine    Protein-calorie malnutrition, severe Nutrition consult ed  Brief/Interim Summary: Failure to thrive in adult/deconditioning 81 year old male with nearly 4 week history of progressive decline including severely decreased PO intake, weakness/fatigue/malaise, inability to perform ADLs and mild weight loss -multifactorial causes for FTT including CKD stage V, recent covid infection, age, debility, wife going to rehab and him at home alone, as well due to UTI. -PT/OT consulted, will need  SNF placement. -TSH, and B12 not low, vitamin D level is low, already on supplements UTI   UTI -Patient has a chronic Foley, he reports it has not been changed for at least 30 days, UA strongly positive. -Leukocytosis trending down which is reassuring -Continue with IV Rocephin , urine culture is unhelpful as growing multiple species. -Foley catheter exchange 3/8 -Will change Rocephin to Keflex and treat for total of 7 days.  Will need another 3 days as an outpatient.   COVID-19 virus infection - Positive test on 2/21 with hospitalization from 2/25-3/2 for fatigue/ambulatory dysfunctin -completed 3 day course of decadron and 5 day course of molnupiravir -No hypoxia, not pneumonia, more than 10 days from initial infection,no need  for COVID  isolation, will discontinue.   CKD (chronic kidney disease) stage 5, GFR less than 15 ml/min (HCC) At baseline and plans for HD through [place] I would think this could be a big  contributing factor to his fatigue and other issues as well as covid/protein calorie malnutrition/age and debility  Avoid nephrotoxic drugs  Strict I/O trend   Type 2 diabetes mellitus without complication, with long-term current use of insulin (HCC) Continue 8 units long acting insulin and SSI    Chronic indwelling Foley catheter Has had since 2021, daughter and patient unsure why this was placed Foley care , patient report last time Foley catheter was changed around 30 days ago,  -Foley catheter exchanged 3/7  Anemia due to CKD Baseline of 10-11, stable. Continue to monitor    Hypertension Continue cardizem and hydralazine    Protein-calorie malnutrition, severe Nutrition consult ed  Discharge Diagnoses:  Principal Problem:   Failure to thrive in adult Active Problems:   Leukocytosis   COVID-19 virus infection   CKD (chronic kidney disease) stage 5, GFR less than 15 ml/min (HCC)   Type 2 diabetes mellitus without complication, with long-term current use of insulin (HCC)   Chronic indwelling Foley catheter   Anemia   Hypertension   Protein-calorie malnutrition, severe   Ambulatory dysfunction    Discharge Instructions  Discharge Instructions     Diet - low sodium heart healthy   Complete by: As directed    Discharge instructions   Complete by: As directed    Follow with Primary MD [facility] /SNF physician  Get CBC, CMP,  checked  by Primary MD next visit.    Activity: As tolerated with Full fall precautions use walker/cane & assistance as needed   Disposition SNF   Diet: Heart Healthy    On your next visit with your primary care physician please Get Medicines reviewed and adjusted.   Please request your Prim.MD to go over all Hospital Tests and Procedure/Radiological results at the follow up, please get all Hospital records sent to your Prim MD by signing hospital release before you go home.   If you experience worsening of your  admission symptoms, develop shortness of breath, life threatening emergency, suicidal or homicidal thoughts you must seek medical attention immediately by calling 911 or calling your MD immediately  if symptoms less severe.  You Must read complete instructions/literature along with all the possible adverse reactions/side effects for all the Medicines you take and that have been prescribed to you. Take any new Medicines after you have completely understood and accpet all the possible adverse reactions/side effects.   Do not drive, operating heavy machinery, perform activities at heights, swimming or participation in water activities or provide baby sitting services if your were admitted for syncope or siezures until you have seen by Primary MD or a Neurologist and advised to do so again.  Do not drive when taking Pain medications.    Do not take more than prescribed Pain, Sleep and Anxiety Medications  Special Instructions: If you have smoked or chewed Tobacco  in the last 2 yrs please stop smoking, stop any regular Alcohol  and or any Recreational drug use.  Wear Seat belts while driving.   Please note  You were cared for by a hospitalist during your hospital stay. If you have any questions about your discharge medications or the care you received while you were in the hospital after you are discharged, you can call the unit and asked to speak with  the hospitalist on call if the hospitalist that took care of you is not available. Once you are discharged, your primary care physician will handle any further medical issues. Please note that NO REFILLS for any discharge medications will be authorized once you are discharged, as it is imperative that you return to your primary care physician (or establish a relationship with a primary care physician if you do not have one) for your aftercare needs so that they can reassess your need for medications and monitor your lab values.   Increase activity  slowly   Complete by: As directed       Allergies as of 08/19/2021       Reactions   Lisinopril Cough   Semaglutide Other (See Comments)   indigestion        Medication List     STOP taking these medications    insulin aspart 100 UNIT/ML FlexPen Commonly known as: NOVOLOG Replaced by: insulin aspart 100 UNIT/ML injection       TAKE these medications    ascorbic acid 500 MG tablet Commonly known as: VITAMIN C Take 1 tablet (500 mg total) by mouth daily.   cephALEXin 250 MG capsule Commonly known as: KEFLEX Take 1 capsule (250 mg total) by mouth every 12 (twelve) hours for 3 days.   diltiazem 240 MG 24 hr capsule Commonly known as: TIAZAC Take 240 mg by mouth daily.   glucose 4 GM chewable tablet Chew 4 tablets by mouth See admin instructions. Chew 4 tablets (16 g) by mouth as needed for low blood sugar, repeat every 15 minutes if blood sugar is less than 70   hydrALAZINE 25 MG tablet Commonly known as: APRESOLINE Take 25 mg by mouth 3 (three) times daily.   insulin aspart 100 UNIT/ML injection Commonly known as: novoLOG Inject 0-6 Units into the skin 3 (three) times daily with meals. Replaces: insulin aspart 100 UNIT/ML FlexPen   insulin glargine-yfgn 100 UNIT/ML injection Commonly known as: SEMGLEE Inject 8 Units into the skin daily.   latanoprost 0.005 % ophthalmic solution Commonly known as: XALATAN Place 1 drop into both eyes every evening.   multivitamin with minerals Tabs tablet Take 1 tablet by mouth daily.   sevelamer carbonate 800 MG tablet Commonly known as: RENVELA Take 1 tablet (800 mg total) by mouth 3 (three) times daily with meals.   sodium bicarbonate 650 MG tablet Take 1 tablet (650 mg total) by mouth 2 (two) times daily. What changed: how much to take   Vitamin D3 50 MCG (2000 UT) Tabs Take 2,000 Units by mouth daily.   zinc sulfate 220 (50 Zn) MG capsule Take 1 capsule (220 mg total) by mouth daily.        Allergies   Allergen Reactions   Lisinopril Cough   Semaglutide Other (See Comments)    indigestion    Consultations: None   Procedures/Studies: DG Chest 2 View  Result Date: 08/14/2021 CLINICAL DATA:  COVID infection with generalized weakness EXAM: CHEST - 2 VIEW COMPARISON:  08/05/2021 FINDINGS: Cardiac shadow is within normal limits. Aortic calcifications are seen. The lungs are clear bilateral with the exception of minimal left basilar atelectasis. No sizable effusion is noted. No bony abnormality is seen. IMPRESSION: Minimal left basilar atelectasis. Electronically Signed   By: Inez Catalina M.D.   On: 08/14/2021 23:19   DG Chest 2 View  Result Date: 08/05/2021 CLINICAL DATA:  Covid.  Shortness of breath. EXAM: CHEST - 2 VIEW COMPARISON:  02/12/2020 FINDINGS:  Heart size is normal accounting for position. The lungs are free of focal consolidations and pleural effusions. No pulmonary edema. Moderate midthoracic spondylosis. IMPRESSION: No active cardiopulmonary disease. Electronically Signed   By: Nolon Nations M.D.   On: 08/05/2021 13:20   US Renal  Result Date: 08/05/2021 CLINICAL DATA:  Hydronephrosis EXAM: RENAL / URINARY TRACT ULTRASOUND COMPLETE COMPARISON:  CT 02/03/2020 FINDINGS: Right Kidney: Renal measurements: 10.5 x 5.7 x 5.6 cm = volume: 174 mL. Renal cortical thickness is preserved the cortical echogenicity is diffusely increased suggesting changes of underlying medical renal disease. There is moderate right hydronephrosis, similar to prior CT examination. The lower pole of the right kidney is obscured by overlying bowel gas. No intraluminal masses or calcifications are seen within the visualized portion of the right kidney. A small amount of layering echogenic debris is seen within the distended upper calices of the right kidney. Left Kidney: Absent.  The left renal fossa is obscured by aerated loops of bowel. Bladder: The bladder is mildly distended though a Foley catheter is seen  within the bladder lumen. There is moderate layering echogenic debris within the bladder lumen. Several septa are also identified within the bladder in close proximity to the catheter possibly relating to proteinaceous or hemorrhagic debris. Other: None. IMPRESSION: Diffusely increased renal cortical echogenicity suggesting underlying medical renal disease. Moderate right hydronephrosis. Layering debris within the distended calices can be seen in the setting of hematuria or pyonephrosis. Correlation with urinalysis and urine culture may be helpful. Layering debris noted within the bladder. Foley catheter noted within the bladder lumen. Distention of the bladder, however, may reflect malfunction and/or occlusion of the drainage catheter. Clinical correlation is suggested. Electronically Signed   By: Fidela Salisbury M.D.   On: 08/05/2021 20:55      Subjective:  Patient denies any complaints today, no nausea, no vomiting, no fever or chest pain Discharge Exam: Vitals:   08/19/21 0800 08/19/21 1000  BP: (!) 153/61   Pulse: 80 76  Resp: 16   Temp: 98.2 F (36.8 C)   SpO2: 97% 97%   Vitals:   08/19/21 0406 08/19/21 0500 08/19/21 0800 08/19/21 1000  BP: (!) 162/65  (!) 153/61   Pulse: 81 80 80 76  Resp: 18  16   Temp: 98.4 F (36.9 C)  98.2 F (36.8 C)   TempSrc: Oral  Oral   SpO2: 96% 99% 97% 97%  Weight:      Height:        General: Pt is alert, awake, not in acute distress,frail  Cardiovascular: RRR, S1/S2 +, no rubs, no gallops Respiratory: CTA bilaterally, no wheezing, no rhonchi Abdominal: Soft, NT, ND, bowel sounds + Extremities: no edema, no cyanosis    The results of significant diagnostics from this hospitalization (including imaging, microbiology, ancillary and laboratory) are listed below for reference.     Microbiology: Recent Results (from the past 240 hour(s))  Culture, blood (routine x 2)     Status: None (Preliminary result)   Collection Time: 08/15/21  9:33 AM    Specimen: BLOOD  Result Value Ref Range Status   Specimen Description BLOOD SITE NOT SPECIFIED  Final   Special Requests   Final    BOTTLES DRAWN AEROBIC AND ANAEROBIC Blood Culture results may not be optimal due to an excessive volume of blood received in culture bottles   Culture   Final    NO GROWTH 4 DAYS Performed at [place],  [place] [address]    Report Status PENDING  Incomplete  Culture, blood (routine x 2)     Status: None (Preliminary result)   Collection Time: 08/15/21  9:58 AM   Specimen: BLOOD  Result Value Ref Range Status   Specimen Description BLOOD SITE NOT SPECIFIED  Final   Special Requests   Final    BOTTLES DRAWN AEROBIC AND ANAEROBIC Blood Culture results may not be optimal due to an inadequate volume of blood received in culture bottles   Culture   Final    NO GROWTH 4 DAYS Performed at [facility], [place] [address], [place], [place] [address]    Report Status PENDING  Incomplete  Urine Culture     Status: Abnormal   Collection Time: 08/15/21  1:52 PM   Specimen: Urine, Catheterized  Result Value Ref Range Status   Specimen Description URINE, CATHETERIZED  Final   Special Requests   Final    NONE Performed at [facility], [place] [address], [place],  [address]    Culture MULTIPLE SPECIES PRESENT, SUGGEST RECOLLECTION (A)  Final   Report Status 08/16/2021 FINAL  Final     Labs: BNP (last 3 results) Recent Labs    08/05/21 1159  BNP 09.3   Basic Metabolic Panel: Recent Labs  Lab 08/14/21 2259 08/15/21 1426 08/16/21 0452 08/17/21 0251  NA 143  --  143 144  K 4.4  --  4.5 4.0  CL 110  --  108 109  CO2 21*  --  22 24  GLUCOSE 191*  --  109* 113*  BUN 66*  --  63* 60*  CREATININE 5.37* 5.93* 5.37* 5.37*  CALCIUM 8.3*  --  8.3* 8.3*   Liver Function Tests: Recent Labs  Lab 08/14/21 2259  AST 13*  ALT 20  ALKPHOS 60  BILITOT 0.6  PROT 6.4*  ALBUMIN 2.2*   No results for input(s):  LIPASE, AMYLASE in the last 168 hours. No results for input(s): AMMONIA in the last 168 hours. CBC: Recent Labs  Lab 08/14/21 2259 08/15/21 1426 08/16/21 0452 08/17/21 0251  WBC 24.3* 27.7* 23.2* 17.0*  NEUTROABS 22.4*  --  19.3*  --   HGB 10.2* 10.0* 10.2* 10.1*  HCT 32.5* 32.1* 31.2* 30.4*  MCV 92.6 93.3 91.0 89.9  PLT 260 247 237 212   Cardiac Enzymes: No results for input(s): CKTOTAL, CKMB, CKMBINDEX, TROPONINI in the last 168 hours. BNP: Invalid input(s): POCBNP CBG: Recent Labs  Lab 08/18/21 0743 08/18/21 1154 08/18/21 1742 08/18/21 2102 08/19/21 0813  GLUCAP 96 157* 121* 194* 98   D-Dimer No results for input(s): DDIMER in the last 72 hours. Hgb A1c No results for input(s): HGBA1C in the last 72 hours. Lipid Profile No results for input(s): CHOL, HDL, LDLCALC, TRIG, CHOLHDL, LDLDIRECT in the last 72 hours. Thyroid function studies No results for input(s): TSH, T4TOTAL, T3FREE, THYROIDAB in the last 72 hours.  Invalid input(s): FREET3 Anemia work up No results for input(s): VITAMINB12, FOLATE, FERRITIN, TIBC, IRON, RETICCTPCT in the last 72 hours. Urinalysis    Component Value Date/Time   COLORURINE YELLOW 08/14/2021 2244   APPEARANCEUR TURBID (A) 08/14/2021 2244   LABSPEC 1.014 08/14/2021 2244   PHURINE 9.0 (H) 08/14/2021 2244   GLUCOSEU NEGATIVE 08/14/2021 2244   HGBUR SMALL (A) 08/14/2021 2244   BILIRUBINUR NEGATIVE 08/14/2021 2244   KETONESUR 5 (A) 08/14/2021 2244   PROTEINUR >=300 (A) 08/14/2021 2244   UROBILINOGEN 1.0 01/21/2008 2334  NITRITE NEGATIVE 08/14/2021 2244   LEUKOCYTESUR LARGE (A) 08/14/2021 2244   Sepsis Labs Invalid input(s): PROCALCITONIN,  WBC,  LACTICIDVEN Microbiology Recent Results (from the past 240 hour(s))  Culture, blood (routine x 2)     Status: None (Preliminary result)   Collection Time: 08/15/21  9:33 AM   Specimen: BLOOD  Result Value Ref Range Status   Specimen Description BLOOD SITE NOT SPECIFIED  Final    Special Requests   Final    BOTTLES DRAWN AEROBIC AND ANAEROBIC Blood Culture results may not be optimal due to an excessive volume of blood received in culture bottles   Culture   Final    NO GROWTH 4 DAYS Performed at [facility], [place] [address], [place], [place] [address]    Report Status PENDING  Incomplete  Culture, blood (routine x 2)     Status: None (Preliminary result)   Collection Time: 08/15/21  9:58 AM   Specimen: BLOOD  Result Value Ref Range Status   Specimen Description BLOOD SITE NOT SPECIFIED  Final   Special Requests   Final    BOTTLES DRAWN AEROBIC AND ANAEROBIC Blood Culture results may not be optimal due to an inadequate volume of blood received in culture bottles   Culture   Final    NO GROWTH 4 DAYS Performed at [facility], [place] [address], [place], [place] [address]    Report Status PENDING  Incomplete  Urine Culture     Status: Abnormal   Collection Time: 08/15/21  1:52 PM   Specimen: Urine, Catheterized  Result Value Ref Range Status   Specimen Description URINE, CATHETERIZED  Final   Special Requests   Final    NONE Performed at [facility], [place] [address] [place], [place] [address]    Culture MULTIPLE SPECIES PRESENT, SUGGEST RECOLLECTION (A)  Final   Report Status 08/16/2021 FINAL  Final     Time coordinating discharge: Over 30 minutes  SIGNED:   Phillips Climes, MD  [facility] 08/19/2021, 11:18 AM Pager   If 7PM-7AM, please contact night-coverage [email or web address] Password [email or web address]

## 2021-08-19 NOTE — TOC Transition Note (Signed)
Transition of Care (TOC) - CM/SW Discharge Note ? ? ?Patient Details  ?Name: Paul Bond ?MRN: [id number] ?Date of Birth: October 04, 1940 ? ?Transition of Care (TOC) CM/SW Contact:  ?Carolin Sicks, LCSWA ?Phone Number: ?08/19/2021, 11:38 AM ? ? ?Clinical Narrative:    ?Patient will Discharge To: [facility] ?Anticipated DC Date:08/19/21 ?Family Notified:yes, daughter Delsa Bern [phone] ?Transport NA:TFTD ? ? ?Per MD patient ready for DC to  [facility]. RN, patient, patient's family, and facility notified of DC. Assessment, Fl2/Pasrr, and Discharge Summary sent to facility. RN given number for report [phone], Room #103). DC packet on chart. Ambulance transport requested for patient for 1:00pm ? ?CSW signing off. ? ?Reed Breech ?LCSWA ?[phone] ?  ? ? ?Final next level of care: [place] ?Barriers to Discharge: No Barriers Identified ? ? ?Patient Goals and CMS Choice ?Patient states their goals for this hospitalization and ongoing recovery are:: Rehab ?CMS Medicare.gov Compare Post Acute Care list provided to:: Patient ?Choice offered to / list presented to : Patient, Adult Children ? ?Discharge Placement ?  ?           ?Patient [facility] bed at: [facility] ?Patient to be transferred to facility by: [facility] ?Name of family member notified: Delsa Bern ?Patient and family notified of of transfer: 08/19/21 ? ?Discharge Plan and Services ?In-house Referral: Clinical Social Work ?  ?Post Acute Care Choice: [place]          ?  ?  ?  ?  ?  ?  ?  ?  ?  ?  ? ?Social Determinants of Health (SDOH) Interventions ?  ? ? ?Readmission Risk Interventions ?Readmission Risk Prevention Plan 08/07/2021  ?Transportation Screening Complete  ?PCP or Specialist Appt within 3-5 Days Complete  ?[place] or Home Care Consult Complete  ?Social Work Consult for [place] Planning/Counseling Complete  ?Palliative Care Screening Not Applicable  ?Medication Review Press photographer) Complete   ?Some recent data might be hidden  ? ? ? ? ? ?

## 2021-08-19 NOTE — TOC Progression Note (Signed)
Transition of Care (TOC) - Progression Note  ? ? ?Patient Details  ?Name: Paul Bond ?MRN: [id number] ?Date of Birth: [birth year] ? ?Transition of Care (TOC) CM/SW Contact  ?Carolin Sicks, LCSWA ?Phone Number: ?08/19/2021, 10:40 AM ? ?Clinical Narrative:    ?CSW was contacted by [employer].  Pt can discharge to facility today.  Physician has been updated.  TOC will continue to assist with disposition planning. ? ? ?Expected Discharge Plan: [place] ?Barriers to Discharge: Insurance Authorization ? ?Expected Discharge Plan and Services ?Expected Discharge Plan: [place] ?In-house Referral: Clinical Social Work ?  ?Post Acute Care Choice: [place] ?Living arrangements for the past 2 months: [place] ?                ?  ?  ?  ?  ?  ?  ?  ?  ?  ?  ? ? ?Social Determinants of Health (SDOH) Interventions ?  ? ?Readmission Risk Interventions ?Readmission Risk Prevention Plan 08/07/2021  ?Transportation Screening Complete  ?PCP or Specialist Appt within 3-5 Days Complete  ?[place] or Home Care Consult Complete  ?Social Work Consult for [place] Planning/Counseling Complete  ?Palliative Care Screening Not Applicable  ?Medication Review Press photographer) Complete  ?Some recent data might be hidden  ? ? ?

## 2021-08-19 NOTE — Discharge Instructions (Signed)
Follow with Primary MD Administration, [facility] /SNF physician ? ?Get CBC, CMP,  checked  by Primary MD next visit.  ? ? ?Activity: As tolerated with Full fall precautions use walker/cane & assistance as needed ? ? ?Disposition SNF ? ? ?Diet: Heart Healthy  ? ? ?On your next visit with your primary care physician please Get Medicines reviewed and adjusted. ? ? ?Please request your Prim.MD to go over all Hospital Tests and Procedure/Radiological results at the follow up, please get all Hospital records sent to your Prim MD by signing hospital release before you go home. ? ? ?If you experience worsening of your admission symptoms, develop shortness of breath, life threatening emergency, suicidal or homicidal thoughts you must seek medical attention immediately by calling 911 or calling your MD immediately  if symptoms less severe. ? ?You Must read complete instructions/literature along with all the possible adverse reactions/side effects for all the Medicines you take and that have been prescribed to you. Take any new Medicines after you have completely understood and accpet all the possible adverse reactions/side effects.  ? ?Do not drive, operating heavy machinery, perform activities at heights, swimming or participation in water activities or provide baby sitting services if your were admitted for syncope or siezures until you have seen by Primary MD or a Neurologist and advised to do so again. ? ?Do not drive when taking Pain medications.  ? ? ?Do not take more than prescribed Pain, Sleep and Anxiety Medications ? ?Special Instructions: If you have smoked or chewed Tobacco  in the last 2 yrs please stop smoking, stop any regular Alcohol  and or any Recreational drug use. ? ?Wear Seat belts while driving. ? ? ?Please note ? ?You were cared for by a hospitalist during your hospital stay. If you have any questions about your discharge medications or the care you received while you were in the hospital after you are  discharged, you can call the unit and asked to speak with the hospitalist on call if the hospitalist that took care of you is not available. Once you are discharged, your primary care physician will handle any further medical issues. Please note that NO REFILLS for any discharge medications will be authorized once you are discharged, as it is imperative that you return to your primary care physician (or establish a relationship with a primary care physician if you do not have one) for your aftercare needs so that they can reassess your need for medications and monitor your lab values.  ?

## 2021-08-20 LAB — CULTURE, BLOOD (ROUTINE X 2)
Culture: NO GROWTH
Culture: NO GROWTH

## 2021-09-05 ENCOUNTER — Telehealth: Payer: Self-pay

## 2021-09-05 NOTE — Telephone Encounter (Signed)
Spoke with patient's daughter Lawerance Bach and scheduled a in person per request Palliative Consult for 09/07/21 @ 12:15 PM.  ? ?Consent obtained; updated Outlook/Netsmart/Team List and Epic.  ? ? ?

## 2021-09-07 ENCOUNTER — Other Ambulatory Visit: Payer: Medicare Other | Admitting: Internal Medicine

## 2021-09-07 VITALS — BP 170/68 | HR 108

## 2021-09-07 DIAGNOSIS — E440 Moderate protein-calorie malnutrition: Secondary | ICD-10-CM

## 2021-09-07 DIAGNOSIS — Z515 Encounter for palliative care: Secondary | ICD-10-CM

## 2021-09-07 DIAGNOSIS — N185 Chronic kidney disease, stage 5: Secondary | ICD-10-CM

## 2021-09-09 ENCOUNTER — Encounter: Payer: Self-pay | Admitting: Internal Medicine

## 2021-09-09 DIAGNOSIS — H401190 Primary open-angle glaucoma, unspecified eye, stage unspecified: Secondary | ICD-10-CM | POA: Insufficient documentation

## 2021-09-09 DIAGNOSIS — N135 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: Secondary | ICD-10-CM | POA: Insufficient documentation

## 2021-09-09 DIAGNOSIS — R3989 Other symptoms and signs involving the genitourinary system: Secondary | ICD-10-CM | POA: Insufficient documentation

## 2021-09-09 DIAGNOSIS — B360 Pityriasis versicolor: Secondary | ICD-10-CM | POA: Insufficient documentation

## 2021-09-09 DIAGNOSIS — R972 Elevated prostate specific antigen [PSA]: Secondary | ICD-10-CM | POA: Insufficient documentation

## 2021-09-09 DIAGNOSIS — R338 Other retention of urine: Secondary | ICD-10-CM | POA: Insufficient documentation

## 2021-09-09 DIAGNOSIS — M779 Enthesopathy, unspecified: Secondary | ICD-10-CM | POA: Insufficient documentation

## 2021-09-09 DIAGNOSIS — C20 Malignant neoplasm of rectum: Secondary | ICD-10-CM | POA: Insufficient documentation

## 2021-09-09 DIAGNOSIS — R7989 Other specified abnormal findings of blood chemistry: Secondary | ICD-10-CM | POA: Insufficient documentation

## 2021-09-09 DIAGNOSIS — H2510 Age-related nuclear cataract, unspecified eye: Secondary | ICD-10-CM | POA: Insufficient documentation

## 2021-09-09 DIAGNOSIS — R809 Proteinuria, unspecified: Secondary | ICD-10-CM | POA: Insufficient documentation

## 2021-09-09 DIAGNOSIS — E663 Overweight: Secondary | ICD-10-CM | POA: Insufficient documentation

## 2021-09-09 NOTE — Progress Notes (Signed)
[facility] Palliative Care Consult Note Telephone: [phone]  Fax: [phone]   Date of encounter: 09/09/21 10:04 AM PATIENT NAME: Paul Bond [id number]   [phone] (home)  DOB: 16-Apr-1941 MRN: [id number] PRIMARY CARE PROVIDER:    Administration, [place],  [place] [address] [phone]  REFERRING PROVIDER:   Administration, [address],  [place] [address] [phone]  RESPONSIBLE PARTY:    Contact Information     Name Relation Home Work Mobile   brown,tabitha Daughter [phone]  [phone]   YAVUZ, KIRBY [phone]  [phone]   mebane,denise Niece [phone]  [phone]       PPS: 50%   HOSPICE ELIGIBILITY/DIAGNOSIS:  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 Palliative Care was asked to follow this patient by consultation request of Windy Fast, MD, to address advance care planning, goals of care and symptom management. This is the initial visit.    ASSESSMENT AND PLAN / RECOMMENDATIONS:    Advance Care Planning/Goals of Care: Goals include to maximize quality of life and symptom management. Our advance care planning conversation included a discussion about: ??   The value and importance of advance care planning?   Experiences with loved ones who have been seriously ill or have died.?   Exploration of personal, cultural or spiritual beliefs that might influence medical decisions.?   Exploration of goals of care in the event of a sudden injury or illness.   Identification and preparation of a healthcare agent: Delsa Bern (daughter) is HCPOA.   Review and updating or creation of advance directive documents.   Decision not to resuscitate or to de-escalate disease focused treatments due to poor prognosis.   CODE STATUS: Pt requests very adamantly FULL CODE, has living will and HCPOA but they are not filed in  epic as he is a [place] patient.   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 Melrose Nakayama. 09/01/21 labs: BUN 53, CR 6.03, GFR 9. Patient and daughter goal is to hold off on HD as long as possible. Plan: Follow up with [facility] regarding HD. Education regarding S/S of worsening renal function: AMS, lethargy, shaking/twitching, increased dyspnea, fatigue, weakness, decreased urine output, and increased edema. Avoid nephrotoxic medications. Monitor I&O's. Hydrate adequately.  Protein Calorie Malnutrition: Reports good appetite, eating 2-3 meals/day. Family cooks some meals (niece) and eats fast food other meals. Reports gradual weight loss since 2019, current Ht. 5'7", Wt 178 lbs 9.2 oz, BMI 27.9. 09/01/21 Albumin 2.1. Plan: Encouraged healthy food choices. Nutritional supplement PRN. Monitor weights. Will ask social worker to reach out re: possible meals on wheels option.    Palliative Care Encounter:  16 mins spent on ACP.  Patient states when it is "my time, nothing's going to stop it," however, still wants to be full code. Patient and daughter both want to hold off on HD as long as possible. Patient appeared reluctant to accept palliative services, however, daughter states she wants to have "extra eyes" on patient and feels it will be helpful and reassuring to have palliative support. Will continue to monitor goals of care, offer support and education regarding disease progression, symptom management, and advanced care planning.  Pt is a strong believer in [place] and feels he has the final say--that nothing we do or don't do will keep him going if it's his time; however, requests full interventions  though he wants to put off hemodialysis.  Tabitha requests that we provide some contacts for CNA assistance for patient to ensure he is looked after closely.    Thank you for the opportunity to participate in the care of Paul Bond, the palliative care team will  continue to follow for complex medical decision making, advance care planning, and clarification of goals.  Return 6 weeks or prn--10/24/2021. Please call our office at [phone] if we can be of additional assistance.    This visit was coded based on medical decision making (MDM).     COVID-19 PATIENT SCREENING TOOL   Asked and negative response unless otherwise noted:      Have you had symptoms of covid, tested positive or been in contact with someone with symptoms/positive test in the past 5-10 days? no      Chief Complaint: initial palliative care consult.   HISTORY OF PRESENT ILLNESS:  This is an 81 year old male with a past medical history significant for CVA, HTN, HLD, Left nephrectomy, CKD stage 5, insulin dependent DMT2, protein calorie malnutrition, rectal cancer, and BPH seen for palliative care consult to establish goals of care, discuss symptom management, and advanced care planning. Hospitalization 08/14/21-08/19/21 for AMS, poor PO intake, and functional decline with progressive weakness, fatigue, malaise. Multifactorial causes include CKD stage V, recent Covid infection 2/23, and UTI. Patient lives home alone, his wife is in SNF for rehab and followed by [facility] there which is how Tabitha wound up requesting that we follow her father, as well. Alert and oriented with guarded affect. Sleeps well at night. Denies pain. Resting tachycardia with RRR. Soft, non-pitting pedal edema. Denies dyspnea on exertion. Reports good appetite, eating 2-3 meals/day. Niece cooks for him sometimes and he eats out for other meals (fast food). Reports regular, continent bowels. Chronic foley catheter in place, functioning well. Independent with ambulation (has 2 canes and walker that he leaves int he other room often) and self-care since returning home. Daughter states that patient is doing much better functionally.   He says he just took his meds before we arrived.  He gets up late in the morning and  has a slow start.  History obtained from review of EMR, discussion with primary team, and interview with family, facility staff/caregiver and/or patient.    I reviewed available labs, medications, imaging, studies and related documents from the EMR.  Records reviewed and summarized above.    ROS   General: NAD  EYES: denies vision changes  ENMT: denies dysphagia  Cardiovascular: denies chest pain, denies DOE  Pulmonary: denies cough, denies increased SOB  Abdomen: endorses good appetite, denies constipation, endorses continence of bowel  GU: denies dysuria, chronic foley catheter - reports functioning well.  MSK:  denies weakness, no falls reported.  Skin: denies rashes or wounds  Neurological: denies pain, denies insomnia  Psych: Endorses positive mood  Heme/lymph/immuno: denies bruises, abnormal bleeding      Physical Exam: HR 108, BP 170/68, RR 20, 02 sat 97% RA  Current and past weights: 178 lbs 9.2 oz  Constitutional: NAD  General: frail appearing, WNWD  EYES: anicteric sclera, lids intact, no discharge, wears glasses  ENMT: intact hearing, oral mucous membranes moist, dentition intact  CV: S1S2, tachy, soft, non-pitting LE pedal edema with R>L (chronic) Pulmonary: diminished breath sounds, no cough, room air  Abdomen: intake 100%, normo-active BS + 4 quadrants, soft and non-tender, no ascites  GU: deferred  MSK: no sarcopenia, moves all extremities, ambulatory with cane  or walker but furniture surfs w/o these Skin: warm and dry, no rashes or wounds on visible skin  Neuro:  no generalized weakness, no cognitive impairment in record Psych: anxious affect--not a big fan of having [place] there, A and O x 3  Hem/lymph/immuno: no widespread bruising   CURRENT PROBLEM LIST reviewed as in hpi   PAST MEDICAL HISTORY:    Albuminuria * (ICD-9-CM 791.0)   Benign essential hypertension (SNOMED CT 3419379)   Benign prostatic hyperplasia   Cataract, Cortical   Cataract, Nuclear  Sclerosis   Cerebrovascular accident   Chronic kidney disease stage 3 (now 5)  Decreased vitamin D (SNOMED CT 024097353)   in March 2013 - 25(OH) vitamin D was 18   Elevated Prostate Specific Antigen (PSA) (ICD-9-CM 790.93)   H/O: surgery   Oct 11, 2017 Entered By: Ria Clock Comment: S/P Left nephrectomy.   Hyperlipidemia (SNOMED CT 29924268)   Hyperlipidemia * (ICD-9-CM 272.4)   Open Angle Glaucoma   Overweight (ICD-9-CM 278.02)   Rectal Carcinoma (ICD-9-CM 154.1)   Tendonitis * (ICD-9-CM 726.90)   Tinea Versicolor * (ICD-9-CM 111.0)   Diagnosis: ICD-10-CM R39.89 Other symptoms and signs involving the genitourinary systemwith Provider Comments: Genitourinary System Symptoms/Signs NEC   Diagnosis: ICD-10-CM I10 Essential (primary) hypertensionwith Provider Comments: Benign essential hypertension (SCT 3419622)   Diagnosis: ICD-10-CM N13.5 Crossing vessel and stricture of ureter w/o hydronephrosiswith Provider Comments: 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   Diagnosis: ICD-10-CM Z71.89 Other specified counselingwith Provider Comments: Counseling,Other Specified   Diagnosis: ICD-10-CM R33.8 Other retention of urinewith Provider Comments: Other Retention of Urine   Diagnosis: ICD-10-CM N13.30 Unspecified hydronephrosiswith Provider Comments: Unspecified Hydronephrosis    Diagnosis: ICD-10-CM 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 Provider Comments: Type 2 diabetes mellitus with diabetic nephropathy   Diagnosis: ICD-10-CM 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 CURRENT MEDICATIONS:    Cholecalciferol 49mg (2,000 Unit) 1 tab PO daily   Diltiazem 2457m24HR cap 1 cap PO daily   Glucose 4GM chew 4 chews PO PRN for low CBG, repeat Q15 mins until CBG > 70.   Hydralazine HCL 10071mab 1 tab PO TID   Insulin Aspart, 100Unit/ml Novolog Flexpen, 3ml31mnject 5 Units every morning before meal   Insulin, G Largine 100Unit/ml - inject 8 Units once daily   Latanoprost 0.005% - instill 1 drop in both eyes every evening for eye pressure   See Assessment/Plan in the beginning of the note.   Chenille Toor L. ReedVernon PreyD  Palliative Care  Phone:  [phone]

## 2021-09-27 ENCOUNTER — Telehealth: Payer: Self-pay | Admitting: Physician Assistant

## 2021-09-27 NOTE — Telephone Encounter (Signed)
Scheduled appt per 4/18 referral. Pt is aware of appt date and time. Pt is aware to arrive 15 mins prior to appt time and to bring and updated insurance card. Pt is aware of appt location.   ?

## 2021-10-11 ENCOUNTER — Inpatient Hospital Stay: Payer: Medicare Other

## 2021-10-11 ENCOUNTER — Inpatient Hospital Stay: Payer: Medicare Other | Attending: Physician Assistant | Admitting: Physician Assistant

## 2021-10-11 ENCOUNTER — Other Ambulatory Visit: Payer: Self-pay

## 2021-10-11 VITALS — BP 179/70 | HR 105 | Temp 99.1°F | Resp 17 | Wt 149.6 lb

## 2021-10-11 DIAGNOSIS — I120 Hypertensive chronic kidney disease with stage 5 chronic kidney disease or end stage renal disease: Secondary | ICD-10-CM | POA: Insufficient documentation

## 2021-10-11 DIAGNOSIS — D649 Anemia, unspecified: Secondary | ICD-10-CM

## 2021-10-11 DIAGNOSIS — N185 Chronic kidney disease, stage 5: Secondary | ICD-10-CM | POA: Insufficient documentation

## 2021-10-11 DIAGNOSIS — Z85048 Personal history of other malignant neoplasm of rectum, rectosigmoid junction, and anus: Secondary | ICD-10-CM | POA: Diagnosis not present

## 2021-10-11 DIAGNOSIS — E1122 Type 2 diabetes mellitus with diabetic chronic kidney disease: Secondary | ICD-10-CM | POA: Diagnosis not present

## 2021-10-11 LAB — CBC WITH DIFFERENTIAL (CANCER CENTER ONLY)
Abs Immature Granulocytes: 0.02 10*3/uL (ref 0.00–0.07)
Basophils Absolute: 0.1 10*3/uL (ref 0.0–0.1)
Basophils Relative: 1 %
Eosinophils Absolute: 0.1 10*3/uL (ref 0.0–0.5)
Eosinophils Relative: 1 %
HCT: 25.4 % — ABNORMAL LOW (ref 39.0–52.0)
Hemoglobin: 8 g/dL — ABNORMAL LOW (ref 13.0–17.0)
Immature Granulocytes: 0 %
Lymphocytes Relative: 21 %
Lymphs Abs: 1.6 10*3/uL (ref 0.7–4.0)
MCH: 29.2 pg (ref 26.0–34.0)
MCHC: 31.5 g/dL (ref 30.0–36.0)
MCV: 92.7 fL (ref 80.0–100.0)
Monocytes Absolute: 1.4 10*3/uL — ABNORMAL HIGH (ref 0.1–1.0)
Monocytes Relative: 18 %
Neutro Abs: 4.4 10*3/uL (ref 1.7–7.7)
Neutrophils Relative %: 59 %
Platelet Count: 278 10*3/uL (ref 150–400)
RBC: 2.74 MIL/uL — ABNORMAL LOW (ref 4.22–5.81)
RDW: 14.8 % (ref 11.5–15.5)
WBC Count: 7.5 10*3/uL (ref 4.0–10.5)
nRBC: 0 % (ref 0.0–0.2)

## 2021-10-11 LAB — RETIC PANEL
Immature Retic Fract: 3.8 % (ref 2.3–15.9)
RBC.: 2.72 MIL/uL — ABNORMAL LOW (ref 4.22–5.81)
Retic Count, Absolute: 28.8 10*3/uL (ref 19.0–186.0)
Retic Ct Pct: 1.1 % (ref 0.4–3.1)
Reticulocyte Hemoglobin: 31 pg (ref >27.9)

## 2021-10-11 LAB — CMP (CANCER CENTER ONLY)
ALT: 8 U/L (ref 0–44)
AST: 12 U/L — ABNORMAL LOW (ref 15–41)
Albumin: 3.5 g/dL (ref 3.5–5.0)
Alkaline Phosphatase: 67 U/L (ref 38–126)
Anion gap: 11 (ref 5–15)
BUN: 51 mg/dL — ABNORMAL HIGH (ref 8–23)
CO2: 22 mmol/L (ref 22–32)
Calcium: 8.8 mg/dL — ABNORMAL LOW (ref 8.9–10.3)
Chloride: 105 mmol/L (ref 98–111)
Creatinine: 5.76 mg/dL (ref 0.61–1.24)
GFR, Estimated: 9 mL/min — ABNORMAL LOW (ref >60)
Glucose, Bld: 203 mg/dL — ABNORMAL HIGH (ref 70–99)
Potassium: 4.5 mmol/L (ref 3.5–5.1)
Sodium: 138 mmol/L (ref 135–145)
Total Bilirubin: 0.4 mg/dL (ref 0.3–1.2)
Total Protein: 7.2 g/dL (ref 6.5–8.1)

## 2021-10-11 LAB — VITAMIN B12: Vitamin B-12: 1399 pg/mL — ABNORMAL HIGH (ref 180–914)

## 2021-10-11 LAB — IRON AND IRON BINDING CAPACITY (CC-WL,HP ONLY)
Iron: 28 ug/dL — ABNORMAL LOW (ref 45–182)
Saturation Ratios: 11 % — ABNORMAL LOW (ref 17.9–39.5)
TIBC: 248 ug/dL — ABNORMAL LOW (ref 250–450)
UIBC: 220 ug/dL (ref 117–376)

## 2021-10-11 LAB — FOLATE: Folate: 19.5 ng/mL (ref >5.9)

## 2021-10-11 NOTE — Progress Notes (Signed)
CRITICAL VALUE STICKER ? ?CRITICAL VALUE: SCR 5.76 mg/dL ? ?RECEIVER (on-site recipient of call): Carlis Abbott, RN ? ?DATE & TIME NOTIFIED: 10/11/2021 & 17:06 ? ?MESSENGER (representative from lab): Rhonda B  ? ?This RN notified [place], [place].  ?

## 2021-10-12 LAB — FERRITIN: Ferritin: 748 ng/mL — ABNORMAL HIGH (ref 24–336)

## 2021-10-12 NOTE — Progress Notes (Signed)
?[place] ?[phone]   [phone] ? ?INITIAL CONSULT NOTE ? ?Patient Care Team: ?Administration, Veterans as PCP - General ? ? ?CHIEF COMPLAINTS/PURPOSE OF CONSULTATION:  ?Anemia ? ?HISTORY OF PRESENTING ILLNESS:  ?Savannah Erbe Koskinen 81 y.o. male with medical history significant for history of a left nephrectomy, stage 5 CKD, TUD, and malignant neoplasm of the rectum. He was referred by his PCP for anemia. ? ?Upon review of his records, Mr. Apollo hemoglobin level has ranged from 9.7 to 10.4 for the past couple of months. He is not on dialysis at this time. ? ?At today's visit, Mr. Granillo reports that he is doing well today and denies fatigue, SOB, dizziness, and is able to complete his ADLs normally. He denies any appetite change, unintentional weight loss, or ice cravings.  He denies heart palpitations and chest pain. He denies constipation, diarrhea, nausea, or vomiting. No abnormal bleeding or spontaneous bruising. He reports his last colonoscopy was about 5 years ago and it showed no abnormalities. He has no other complaints and rest of the 10 point ROS is below.  ? ? ?MEDICAL HISTORY:  ?Past Medical History:  ?Diagnosis Date  ? Cancer [facility])   ? Diabetes mellitus without complication ([place])   ? Hypertension   ? Stroke [facility])   ? ? ?SURGICAL HISTORY: ?Past Surgical History:  ?Procedure Laterality Date  ? BIOPSY  02/18/2020  ? Procedure: BIOPSY;  Surgeon: [name], MD;  Location: [facility];  Service: Gastroenterology;;  ? COLON SURGERY    ? COLONOSCOPY  02/18/2020  ? COLONOSCOPY WITH PROPOFOL N/A 02/18/2020  ? Procedure: COLONOSCOPY WITH PROPOFOL;  Surgeon: [name], MD;  Location: [place] [facility];  Service: Gastroenterology;  Laterality: N/A;  ? CYSTOSCOPY W/ URETERAL STENT PLACEMENT Right 02/03/2020  ? Procedure: CYSTOSCOPY WITH RETROGRADE PYELOGRAM/URETERAL DOUBLE J STENT PLACEMENT;  Surgeon: [name], MD;  Location: [place];  Service: Urology;   Laterality: Right;  ? ESOPHAGOGASTRODUODENOSCOPY (EGD) WITH PROPOFOL N/A 02/18/2020  ? Procedure: ESOPHAGOGASTRODUODENOSCOPY (EGD) WITH PROPOFOL;  Surgeon: [name], MD;  Location: [place];  Service: Gastroenterology;  Laterality: N/A;  ? POLYPECTOMY  02/18/2020  ? Procedure: POLYPECTOMY;  Surgeon: [name], MD;  Location[facility] ENDOSCOPY;  Service: Gastroenterology;;  ? ? ?SOCIAL HISTORY: ?Social History  ? ?Socioeconomic History  ? Marital status: Married  ?  Spouse name: Not on file  ? Number of children: Not on file  ? Years of education: Not on file  ? Highest education level: Not on file  ?Occupational History  ? Not on file  ?Tobacco Use  ? Smoking status: Former  ? Smokeless tobacco: Never  ?Vaping Use  ? Vaping Use: Never used  ?Substance and Sexual Activity  ? Alcohol use: Never  ? Drug use: Never  ? Sexual activity: Not on file  ?Other Topics Concern  ? Not on file  ?Social History Narrative  ? Not on file  ? ?Social Determinants of Health  ? ?Financial Resource Strain: Not on file  ?Food Insecurity: Not on file  ?Transportation Needs: Not on file  ?Physical Activity: Not on file  ?Stress: Not on file  ?Social Connections: Not on file  ?Intimate Partner Violence: Not on file  ? ? ?FAMILY HISTORY: ?No family history on file. ? ?ALLERGIES:  is allergic to lisinopril and semaglutide. ? ?MEDICATIONS:  ?Current Outpatient Medications  ?Medication Sig Dispense Refill  ? Cholecalciferol (VITAMIN D3) 50 MCG (2000 UT) TABS Take 2,000 Units by mouth daily.    ?  diltiazem (TIAZAC) 240 MG 24 hr capsule Take 240 mg by mouth daily.    ? glucose 4 GM chewable tablet Chew 4 tablets by mouth See admin instructions. Chew 4 tablets (16 g) by mouth as needed for low blood sugar, repeat every 15 minutes if blood sugar is less than 70    ? hydrALAZINE (APRESOLINE) 100 MG tablet Take 100 mg by mouth 3 (three) times daily.    ? insulin aspart (NOVOLOG) 100 UNIT/ML injection Inject 0-6 Units into the skin 3  (three) times daily with meals. 10 mL 11  ? insulin glargine-yfgn (SEMGLEE) 100 UNIT/ML injection Inject 8 Units into the skin daily.    ? latanoprost (XALATAN) 0.005 % ophthalmic solution Place 1 drop into both eyes every evening.    ? Multiple Vitamin (MULTIVITAMIN WITH MINERALS) TABS tablet Take 1 tablet by mouth daily.    ? sodium bicarbonate 650 MG tablet Take 1 tablet (650 mg total) by mouth 2 (two) times daily. (Patient taking differently: Take 1,300 mg by mouth 2 (two) times daily.)    ? ?No current facility-administered medications for this visit.  ? ? ?REVIEW OF SYSTEMS:   ?Constitutional: ( - ) fevers, ( - )  chills , ( - ) night sweats ?Eyes: ( - ) blurriness of vision, ( - ) double vision, ( - ) watery eyes ?Ears, nose, mouth, throat, and face: ( - ) mucositis, ( - ) sore throat ?Respiratory: ( - ) cough, ( - ) dyspnea, ( - ) wheezes ?Cardiovascular: ( - ) palpitation, ( - ) chest discomfort, ( - ) lower extremity swelling ?Gastrointestinal:  ( - ) nausea, ( - ) heartburn, ( - ) change in bowel habits ?Skin: ( - ) abnormal skin rashes ?Lymphatics: ( - ) new lymphadenopathy, ( - ) easy bruising ?Neurological: ( - ) numbness, ( - ) tingling, ( - ) new weaknesses ?Behavioral/Psych: ( - ) mood change, ( - ) new changes  ?All other systems were reviewed with the patient and are negative. ? ?PHYSICAL EXAMINATION: ?ECOG PERFORMANCE STATUS: 1 - Symptomatic but completely ambulatory ? ?Vitals:  ? 10/11/21 1355  ?BP: (!) 179/70  ?Pulse: (!) 105  ?Resp: 17  ?Temp: 99.1 ?F (37.3 ?C)  ?SpO2: 99%  ? ?Filed Weights  ? 10/11/21 1355  ?Weight: 149 lb 9.6 oz (67.9 kg)  ? ? ?GENERAL: well appearing male in NAD  ?SKIN: skin color, texture, turgor are normal, no rashes or significant lesions ?EYES: conjunctiva are pink and non-injected, sclera clear ?OROPHARYNX: no exudate, no erythema; lips, buccal mucosa, and tongue normal  ?NECK: supple, non-tender ?LYMPH:  no palpable lymphadenopathy in the cervical or supraclavicular  lymph nodes.  ?LUNGS: clear to auscultation and percussion with normal breathing effort ?HEART: regular rate & rhythm and no murmurs and no lower extremity edema ?ABDOMEN: soft, non-tender, non-distended, normal bowel sounds ?Musculoskeletal: no cyanosis of digits and no clubbing  ?PSYCH: alert & oriented x 3, fluent speech ?NEURO: no focal motor/sensory deficits ? ?LABORATORY DATA:  ?I have reviewed the data as listed ? ?  Latest Ref Rng & Units 10/11/2021  ?  4:40 PM 08/17/2021  ?  2:51 AM 08/16/2021  ?  4:52 AM  ?CBC  ?WBC 4.0 - 10.5 K/uL 7.5   17.0   23.2    ?Hemoglobin 13.0 - 17.0 g/dL 8.0   10.1   10.2    ?Hematocrit 39.0 - 52.0 % 25.4   30.4   31.2    ?Platelets  150 - 400 K/uL 278   212   237    ? ? ? ?  Latest Ref Rng & Units 10/11/2021  ?  4:40 PM 08/17/2021  ?  2:51 AM 08/16/2021  ?  4:52 AM  ?CMP  ?Glucose 70 - 99 mg/dL 203   113   109    ?BUN 8 - 23 mg/dL 51   60   63    ?Creatinine 0.61 - 1.24 mg/dL 5.76   5.37   5.37    ?Sodium 135 - 145 mmol/L 138   144   143    ?Potassium 3.5 - 5.1 mmol/L 4.5   4.0   4.5    ?Chloride 98 - 111 mmol/L 105   109   108    ?CO2 22 - 32 mmol/L '22   24   22    '$ ?Calcium 8.9 - 10.3 mg/dL 8.8   8.3   8.3    ?Total Protein 6.5 - 8.1 g/dL 7.2      ?Total Bilirubin 0.3 - 1.2 mg/dL 0.4      ?Alkaline Phos 38 - 126 U/L 67      ?AST 15 - 41 U/L 12      ?ALT 0 - 44 U/L 8      ? ? ?ASSESSMENT & PLAN ?Junaid Wurzer Procell is a 81 y.o. who presents to the clinic for further evaluation for anemia in the setting of chronic kidney disease stage 5. Patient's nephrologist at the [facility] made the referral to determine if patient is eligible for Epogen injections.  ? ?Based on the recent labs from 08/17/2021, Hgb is 10.1. We recommend for patient to proceed with labs today to rule out other causes of anemia including iron deficiency, vitamin B12 deficiency, folate deficiency and paraproteinemia. Recommend Epogen injections if Hgb is less than 10 today.  ? ?#Normocytic anemia: ?--Most likely secondary to chronic kidney  disease stage 5. ?--Under the care of Dr. Melrose Nakayama from [place].  ?--Labs today to check CBC, CMP, iron and TIBC, retic panel, ferritin, vitamin B12, MMA, folate.  ?--Consider epogen injection if Hgb is <10 w

## 2021-10-13 LAB — METHYLMALONIC ACID, SERUM: Methylmalonic Acid, Quantitative: 380 nmol/L — ABNORMAL HIGH (ref 0–378)

## 2021-10-16 LAB — MULTIPLE MYELOMA PANEL, SERUM
Albumin SerPl Elph-Mcnc: 3.1 g/dL (ref 2.9–4.4)
Albumin/Glob SerPl: 0.9 (ref 0.7–1.7)
Alpha 1: 0.3 g/dL (ref 0.0–0.4)
Alpha2 Glob SerPl Elph-Mcnc: 1.3 g/dL — ABNORMAL HIGH (ref 0.4–1.0)
B-Globulin SerPl Elph-Mcnc: 0.9 g/dL (ref 0.7–1.3)
Gamma Glob SerPl Elph-Mcnc: 1.2 g/dL (ref 0.4–1.8)
Globulin, Total: 3.7 g/dL (ref 2.2–3.9)
IgA: 357 mg/dL (ref 61–437)
IgG (Immunoglobin G), Serum: 1005 mg/dL (ref 603–1613)
IgM (Immunoglobulin M), Srm: 287 mg/dL — ABNORMAL HIGH (ref 15–143)
M Protein SerPl Elph-Mcnc: 0.4 g/dL — ABNORMAL HIGH
Total Protein ELP: 6.8 g/dL (ref 6.0–8.5)

## 2021-10-18 ENCOUNTER — Telehealth: Payer: Self-pay | Admitting: Physician Assistant

## 2021-10-19 NOTE — Telephone Encounter (Signed)
After several attempts trying to reach [name] and his family, I was able to connect with his niece, Paul Bond. I explained the importance to reach [name] to review the lab results from 10/11/2021 and our recommendations. Ms. Paul Bond requested to review this with her and she will try to reach [name]. Findings showed worsening anemia with Hgb 8.0. 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 For these reasons, we recommend a bone marrow biopsy. Ms. Paul Bond will discuss this with [name] and call back to schedule the bone marrow biopsy.  ?

## 2021-10-24 ENCOUNTER — Other Ambulatory Visit: Payer: Self-pay | Admitting: Physician Assistant

## 2021-10-24 ENCOUNTER — Other Ambulatory Visit: Payer: Medicare Other | Admitting: Internal Medicine

## 2021-10-24 VITALS — BP 134/64 | HR 67 | Temp 98.6°F | Resp 16

## 2021-10-24 DIAGNOSIS — Z515 Encounter for palliative care: Secondary | ICD-10-CM

## 2021-10-24 DIAGNOSIS — E440 Moderate protein-calorie malnutrition: Secondary | ICD-10-CM

## 2021-10-24 DIAGNOSIS — D472 Monoclonal gammopathy: Secondary | ICD-10-CM

## 2021-10-24 DIAGNOSIS — D649 Anemia, unspecified: Secondary | ICD-10-CM

## 2021-10-24 DIAGNOSIS — N185 Chronic kidney disease, stage 5: Secondary | ICD-10-CM

## 2021-10-24 NOTE — Progress Notes (Signed)
[facility] Palliative Care Follow-Up Visit Telephone: [phone]  Fax: [phone]   Date of encounter: 10/24/21 2:15 PM PATIENT NAME: Paul Bond Paul Bond [id number]   [phone] (home)  DOB: September 16, 1940 MRN: [id number] PRIMARY CARE PROVIDER:    Administration, [place],  Paul Bond [id number] [phone]  REFERRING PROVIDER:   Administration, Paul Bond,  Paul Bond [id number] [phone]  RESPONSIBLE PARTY:    Contact Information     Name Relation Home Work Mobile   Paul Bond Daughter [phone]  [phone]   Paul Bond [phone]  [phone]   Paul Bond Niece [phone]  [phone]        I met face to face with patient and family in his home/facility.  No family was present as daughter at work. Palliative Care was asked to follow this patient by consultation request of  Administration, Veterans to address advance care planning and complex medical decision making. This is follow-up visit.                                     ASSESSMENT AND PLAN / RECOMMENDATIONS:   Advance Care Planning/Goals of Care: Goals include to maximize quality of life and symptom management. Patient/health care surrogate gave his/her permission to discuss.Our advance care planning conversation included a discussion about:    The value and importance of advance care planning  Experiences with loved ones who have been seriously ill or have died  Exploration of personal, cultural or spiritual beliefs that might influence medical decisions  Exploration of goals of care in the event of a sudden injury or illness  Identification  of a healthcare agent  Review and updating or creation of an  advance directive document . Decision not to resuscitate or to de-escalate disease focused treatments due to poor prognosis. CODE STATUS:  full code  Symptom Management/Plan: 1. CKD  (chronic kidney disease) stage 5, GFR less than 15 ml/min (HCC) -continue to monitor for signs of uremia, decreased output, but no new changes, was more alert and able to converse better than last visit  2. Moderate protein-calorie malnutrition (Paul Bond) -continues to eat unhealthy diet, but intake fair and weight stable around 149 (except incorrect on in epic at one point in march)  3. Monoclonal gammopathy -newly noted when anemia found on labs -pt had thought he just needed f/u labs b/c of anemia, but when I called the oncology office for him to get an appt, his PA shared that he actually had monoclonal gammopathy and a bone marrow biopsy was indicated which she then explained to the patient on the phone and a nurse was going to call back to schedule this as he consented, will need family to bring him at 72am which he thought might be a problem  4. Palliative care by specialist -goals remain aggressive though no dialysis  -continue supportive care and await bone marrow results--wiil be challenging to treat in view of CKD5   Follow up Palliative Care Visit: Palliative care will continue to follow for complex medical decision making, advance care planning, and clarification of goals. Return Visit  23-Dec-2021  This visit was coded based on medical decision making (MDM).  PPS: 50%-60% as does still drive but needs help with meal prep  HOSPICE ELIGIBILITY/DIAGNOSIS: TBD  Chief Complaint: Follow-up palliative visit  HISTORY OF PRESENT ILLNESS:  Paul Bond is a 81  y.o. year old male  with ckd5, anemia and now monoclonal gammopathy, diabetes, bph with luts and foley seen for home visit with palliative care.  Unfortunately, no family was present this time.  He reports doing better.  He was walking around in his home more and is back to driving some.  He said he needed some f/u labs with oncology b/c he was anemic, but turned out they'd called numerous times but he was not answering his phone and  voicemail was not set up.  He said new phone.  He is eating and drinking well by his report though his selections are poor including a lot of fast food except when his niece or daughter provide him with food.  He does his own adls and takes meds as directed.  Bp runs high but comes down to 130s after his meds, he says.  History obtained from review of EMR, discussion with primary team, and interview with family, facility staff/caregiver and/or Paul Bond.  I reviewed available labs, medications, imaging, studies and related documents from the EMR.  Records reviewed and summarized above.   ROS  General: NAD EYES: denies vision changes ENMT: denies dysphagia Cardiovascular: denies chest pain, denies DOE Pulmonary: denies cough, denies increased SOB Abdomen: endorses good appetite, denies constipation, endorses continence of bowel GU: denies dysuria, endorses continence of urine--has foley catheter draining dark yellow urine, some odor of urine in home MSK:  denies increased weakness,  no falls reported Skin: denies rashes or wounds Neurological: denies pain, denies insomnia Psych: Endorses positive mood Heme/lymph/immuno: denies bruises, abnormal bleeding  Physical Exam: Current and past weights:  146 lbs last weight per patient Constitutional: NAD General: frail appearing EYES: anicteric sclera, lids intact, no discharge  ENMT: intact hearing, oral mucous membranes moist, dentition intact CV: S1S2, RRR, nonpitting LE edema Pulmonary: LCTA, no increased work of breathing, no cough, room air Abdomen: intake 100%, normo-active BS + 4 quadrants, soft and non tender, no ascites GU: deferred MSK: no sarcopenia, moves all extremities, ambulatory Skin: warm and dry, no rashes or wounds on visible skin Neuro:  generalized weakness, mild cognitive impairment Psych: non-anxious affect, A and O x 3 Hem/lymph/immuno: no widespread bruising  CURRENT PROBLEM LIST:  Patient Active Problem List    Diagnosis Date Noted   Low serum vitamin D 09/09/2021   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 09/09/2021   Elevated prostate specific antigen (PSA) 09/09/2021   Enthesopathy 09/09/2021   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/2021   Other retention of urine 09/09/2021   Other symptoms and signs involving the genitourinary system 09/09/2021   Overweight 09/09/2021   Pityriasis versicolor 09/09/2021   Primary open-angle glaucoma 09/09/2021   Proteinuria 09/09/2021   Senile nuclear sclerosis 09/09/2021   Ambulatory dysfunction 08/16/2021   Failure to thrive in adult 08/15/2021   Benign prostatic hyperplasia with lower urinary tract symptoms 08/15/2021   Other and unspecified hyperlipidemia 08/15/2021   Leukocytosis 08/15/2021   Chronic indwelling Foley catheter 08/15/2021   Chronic kidney disease, stage 5 ([place]) 08/06/2021   COVID-19 virus infection 08/05/2021   Type 2 diabetes mellitus with diabetic nephropathy ([place]) 08/05/2021   Hypertension 08/05/2021   Stroke ([place]) 08/05/2021   Heme positive stool    Normocytic anemia    Benign neoplasm of colon    Colonic ulcer    Acute esophagitis    Obstructive and reflux uropathy, unspecified 02/13/2020   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   Severe sepsis ([place]) 02/13/2020  Protein-calorie malnutrition, severe 02/11/2020   AKI (acute kidney injury) ([place]) 02/02/2020   Hyperkalemia 02/02/2020   Hypermagnesemia 02/02/2020   Hypernatremia 50/27/7412   Metabolic acidosis 87/86/7672   PAST MEDICAL HISTORY:  Active Ambulatory Problems    Diagnosis Date Noted   AKI (acute kidney injury) ([place]) 02/02/2020   Hyperkalemia 02/02/2020   Hypermagnesemia 02/02/2020   Hypernatremia 09/47/0962   Metabolic acidosis 83/66/2947   Protein-calorie malnutrition, severe 02/11/2020   Obstructive and reflux uropathy, unspecified 02/13/2020   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   Severe sepsis ([place]) 02/13/2020   Heme  positive stool    Normocytic anemia    Benign neoplasm of colon    Colonic ulcer    Acute esophagitis    COVID-19 virus infection 08/05/2021   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   Hypertension 08/05/2021   Stroke ([place]) 08/05/2021   Chronic kidney disease, stage 5 ([place]) 08/06/2021   Failure to thrive in adult 08/15/2021   Benign prostatic hyperplasia with lower urinary tract symptoms 08/15/2021   Other and unspecified hyperlipidemia 08/15/2021   Leukocytosis 08/15/2021   Chronic indwelling Foley catheter 08/15/2021   Ambulatory dysfunction 08/16/2021   Low serum vitamin D 09/09/2021   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 09/09/2021   Elevated prostate specific antigen (PSA) 09/09/2021   Enthesopathy 09/09/2021   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[place]) 09/09/2021   Other retention of urine 09/09/2021   Other symptoms and signs involving the genitourinary system 09/09/2021   Overweight 09/09/2021   Pityriasis versicolor 09/09/2021   Primary open-angle glaucoma 09/09/2021   Proteinuria 09/09/2021   Senile nuclear sclerosis 09/09/2021   Resolved Ambulatory Problems    Diagnosis Date Noted   No Resolved Ambulatory Problems   Past Medical History:  Diagnosis Date   Cancer ([place])    Diabetes mellitus without complication ([place])    SOCIAL HX:  Social History   Tobacco Use   Smoking status: Former   Smokeless tobacco: Never  Substance Use Topics   Alcohol use: Never     ALLERGIES:  Allergies  Allergen Reactions   Lisinopril Cough   Semaglutide Other (See Comments)    indigestion     PERTINENT MEDICATIONS:  Outpatient Encounter Medications as of 10/24/2021  Medication Sig   Cholecalciferol (VITAMIN D3) 50 MCG (2000 UT) TABS Take 2,000 Units by mouth daily.   diltiazem (TIAZAC) 240 MG 24 hr capsule Take 240 mg by mouth daily.   glucose 4 GM chewable tablet Chew 4 tablets by mouth See admin instructions. Chew 4 tablets  (16 g) by mouth as needed for low blood sugar, repeat every 15 minutes if blood sugar is less than 70   hydrALAZINE (APRESOLINE) 100 MG tablet Take 100 mg by mouth 3 (three) times daily.   insulin aspart (NOVOLOG) 100 UNIT/ML injection Inject 0-6 Units into the skin 3 (three) times daily with meals.   insulin glargine-yfgn (SEMGLEE) 100 UNIT/ML injection Inject 8 Units into the skin daily.   latanoprost (XALATAN) 0.005 % ophthalmic solution Place 1 drop into both eyes every evening.   Multiple Vitamin (MULTIVITAMIN WITH MINERALS) TABS tablet Take 1 tablet by mouth daily.   sodium bicarbonate 650 MG tablet Take 1 tablet (650 mg total) by mouth 2 (two) times daily. (Patient taking differently: Take 1,300 mg by mouth 2 (two) times daily.)   No facility-administered encounter medications on file as of 10/24/2021.   Thank you for the opportunity to participate in the care of Paul Bond.  The palliative care team will continue to follow. Please call our office at [phone] if we can be of additional assistance.   Hollace Kinnier, DO  COVID-19 PATIENT SCREENING TOOL Asked and negative response unless otherwise noted:  Have you had symptoms of covid, tested positive or been in contact with someone with symptoms/positive test in the past 5-10 days? no

## 2021-10-25 ENCOUNTER — Encounter: Payer: Self-pay | Admitting: Internal Medicine

## 2021-11-20 ENCOUNTER — Other Ambulatory Visit: Payer: Self-pay | Admitting: Radiology

## 2021-11-20 DIAGNOSIS — D472 Monoclonal gammopathy: Secondary | ICD-10-CM

## 2021-11-20 NOTE — H&P (Signed)
Chief Complaint: Patient was seen in consultation today for monoclonal gammopathy of unknown significance and normocytic anemia at the request of Thayil,Irene T  Referring Physician(s): Lincoln Brigham  Supervising Physician: Aletta Edouard  Patient Status: [facility] - Out-pt  History of Present Illness: Paul Bond is a 81 y.o. male with PMH significant for left nephrectomy, CKD stage V and malignant neoplasm of rectum. Due to worsening anemia, pt was referred to oncology/hematology for work-up. Pt referred to IR by Dede Query, PA for bone marrow biopsy d/t monoclonal gammopathy of unknown significance and normocytic anemia.   Past Medical History:  Diagnosis Date   Cancer ([place])    Diabetes mellitus without complication ([place])    Hypertension    Stroke [facility])     Past Surgical History:  Procedure Laterality Date   BIOPSY  02/18/2020   Procedure: BIOPSY;  Surgeon: [name], MD;  Location: [facility] ENDOSCOPY;  Service: Gastroenterology;;   COLON SURGERY     COLONOSCOPY  02/18/2020   COLONOSCOPY WITH PROPOFOL N/A 02/18/2020   Procedure: COLONOSCOPY WITH PROPOFOL;  Surgeon: [name], MD;  Location: [place];  Service: Gastroenterology;  Laterality: N/A;   CYSTOSCOPY W/ URETERAL STENT PLACEMENT Right 02/03/2020   Procedure: CYSTOSCOPY WITH RETROGRADE PYELOGRAM/URETERAL DOUBLE J STENT PLACEMENT;  Surgeon: [name], MD;  Location: [name];  Service: Urology;  Laterality: Right;   ESOPHAGOGASTRODUODENOSCOPY (EGD) WITH PROPOFOL N/A 02/18/2020   Procedure: ESOPHAGOGASTRODUODENOSCOPY (EGD) WITH PROPOFOL;  Surgeon: [name], MD;  Location: [place];  Service: Gastroenterology;  Laterality: N/A;   POLYPECTOMY  02/18/2020   Procedure: POLYPECTOMY;  Surgeon: [name], MD;  Location: [facility] ENDOSCOPY;  Service: Gastroenterology;;    Allergies: Lisinopril and Semaglutide  Medications: Prior to Admission medications   Medication Sig Start Date  End Date Taking? Authorizing Provider  Cholecalciferol (VITAMIN D3) 50 MCG (2000 UT) TABS Take 2,000 Units by mouth daily.    [provider]  diltiazem (TIAZAC) 240 MG 24 hr capsule Take 240 mg by mouth daily.    [provider]  glucose 4 GM chewable tablet Chew 4 tablets by mouth See admin instructions. Chew 4 tablets (16 g) by mouth as needed for low blood sugar, repeat every 15 minutes if blood sugar is less than 70    [provider]  hydrALAZINE (APRESOLINE) 100 MG tablet Take 100 mg by mouth 3 (three) times daily.    [provider]  insulin aspart (NOVOLOG) 100 UNIT/ML injection Inject 0-6 Units into the skin 3 (three) times daily with meals. 08/19/21   Elgergawy, Silver Huguenin, MD  insulin glargine-yfgn (SEMGLEE) 100 UNIT/ML injection Inject 8 Units into the skin daily.    [provider]  latanoprost (XALATAN) 0.005 % ophthalmic solution Place 1 drop into both eyes every evening.    [provider]  Multiple Vitamin (MULTIVITAMIN WITH MINERALS) TABS tablet Take 1 tablet by mouth daily. 02/22/20   Antonieta Pert, MD  sodium bicarbonate 650 MG tablet Take 1 tablet (650 mg total) by mouth 2 (two) times daily. Patient taking differently: Take 1,300 mg by mouth 2 (two) times daily. 02/21/20   Antonieta Pert, MD     History reviewed. No pertinent family history.  Social History   Socioeconomic History   Marital status: Married    Spouse name: Not on file   Number of children: Not on file   Years of education: Not on file   Highest education level: Not on file  Occupational History  Not on file  Tobacco Use   Smoking status: Former   Smokeless tobacco: Never  Vaping Use   Vaping Use: Never used  Substance and Sexual Activity   Alcohol use: Never   Drug use: Never   Sexual activity: Not on file  Other Topics Concern   Not on file  Social History Narrative   Not on file   Social Determinants of Health   Financial Resource Strain: Not  on file  Food Insecurity: Not on file  Transportation Needs: Not on file  Physical Activity: Not on file  Stress: Not on file  Social Connections: Not on file      Review of Systems: A 12 point ROS discussed and pertinent positives are indicated in the HPI above.  All other systems are negative.  Review of Systems  All other systems reviewed and are negative.   Vital Signs: BP (!) 169/70   Pulse 70   Temp 98 F (36.7 C) (Oral)   Resp 18   Ht 5' 7"  (1.702 m)   Wt 151 lb (68.5 kg)   SpO2 99%   BMI 23.65 kg/m   Physical Exam Vitals reviewed.  Constitutional:      Appearance: Normal appearance.  HENT:     Head: Normocephalic and atraumatic.     Mouth/Throat:     Mouth: Mucous membranes are dry.     Pharynx: Oropharynx is clear.  Eyes:     Extraocular Movements: Extraocular movements intact.     Pupils: Pupils are equal, round, and reactive to light.  Cardiovascular:     Rate and Rhythm: Normal rate and regular rhythm.     Pulses: Normal pulses.     Heart sounds: Normal heart sounds.  Pulmonary:     Effort: Pulmonary effort is normal. No respiratory distress.     Breath sounds: Normal breath sounds.  Abdominal:     General: Bowel sounds are normal. There is no distension.     Palpations: Abdomen is soft.     Tenderness: There is no abdominal tenderness. There is no guarding.  Musculoskeletal:     Right lower leg: No edema.     Left lower leg: No edema.  Skin:    General: Skin is warm and dry.  Neurological:     Mental Status: He is alert and oriented to person, place, and time.  Psychiatric:        Mood and Affect: Mood normal.        Behavior: Behavior normal.        Thought Content: Thought content normal.        Judgment: Judgment normal.     Imaging: No results found.  Labs:  CBC: Recent Labs    08/16/21 0452 08/17/21 0251 10/11/21 1640 11/21/21 0930  WBC 23.2* 17.0* 7.5 8.6  HGB 10.2* 10.1* 8.0* 8.0*  HCT 31.2* 30.4* 25.4* 25.4*  PLT  237 212 278 267    COAGS: No results for input(s): "INR", "APTT" in the last 8760 hours.  BMP: Recent Labs    08/14/21 2259 08/15/21 1426 08/16/21 0452 08/17/21 0251 10/11/21 1640  NA 143  --  143 144 138  K 4.4  --  4.5 4.0 4.5  CL 110  --  108 109 105  CO2 21*  --  22 24 22   GLUCOSE 191*  --  109* 113* 203*  BUN 66*  --  63* 60* 51*  CALCIUM 8.3*  --  8.3* 8.3* 8.8*  CREATININE 5.37*  5.93* 5.37* 5.37* 5.76*  GFRNONAA 10* 9* 10* 10* 9*    LIVER FUNCTION TESTS: Recent Labs    08/09/21 0704 08/10/21 0351 08/14/21 2259 10/11/21 1640  BILITOT 0.3 0.2* 0.6 0.4  AST 15 19 13* 12*  ALT 9 12 20 8   ALKPHOS 57 48 60 67  PROT 5.3* 4.9* 6.4* 7.2  ALBUMIN 1.9* 1.8* 2.2* 3.5    TUMOR MARKERS: No results for input(s): "AFPTM", "CEA", "CA199", "CHROMGRNA" in the last 8760 hours.  Assessment and Plan: History significant for left nephrectomy, CKD stage V and malignant neoplasm of rectum. Due to worsening anemia, pt was referred to oncology/hematology for work-up. Pt referred to IR by Dede Query, PA for bone marrow biopsy d/t monoclonal gammopathy of unknown significance and normocytic anemia.   Pt resting on stretcher. He is A&O, calm and pleasant.  He is in no distress.  Pt is NPO per order.  He denies the use of AC/AP.   Risks and benefits of bone marrow biopsy and aspiration with moderate sedation was discussed with the patient and/or patient's family including, but not limited to bleeding, infection, damage to adjacent structures or low yield requiring additional tests.  All of the questions were answered and there is agreement to proceed.  Consent signed and in chart.   Thank you for this interesting consult.  I greatly enjoyed meeting Paul Bond and look forward to participating in their care.  A copy of this report was sent to the requesting provider on this date.  Electronically Signed: Tyson Alias, NP 11/21/2021, 11:41 AM   I spent a total of 20 minutes  in face to face in clinical consultation, greater than 50% of which was counseling/coordinating care for monoclonal gammopathy of unknown significance and normocytic anemia.

## 2021-11-21 ENCOUNTER — Other Ambulatory Visit: Payer: Self-pay

## 2021-11-21 ENCOUNTER — Encounter (HOSPITAL_COMMUNITY): Payer: Self-pay

## 2021-11-21 ENCOUNTER — Ambulatory Visit (HOSPITAL_COMMUNITY)
Admission: RE | Admit: 2021-11-21 | Discharge: 2021-11-21 | Disposition: A | Discharge status: Home / Self Care | Payer: Medicare Other | Source: Ambulatory Visit | Attending: Physician Assistant | Admitting: Physician Assistant

## 2021-11-21 DIAGNOSIS — Z85048 Personal history of other malignant neoplasm of rectum, rectosigmoid junction, and anus: Secondary | ICD-10-CM | POA: Diagnosis not present

## 2021-11-21 DIAGNOSIS — D472 Monoclonal gammopathy: Secondary | ICD-10-CM | POA: Insufficient documentation

## 2021-11-21 DIAGNOSIS — D649 Anemia, unspecified: Secondary | ICD-10-CM | POA: Insufficient documentation

## 2021-11-21 DIAGNOSIS — I120 Hypertensive chronic kidney disease with stage 5 chronic kidney disease or end stage renal disease: Secondary | ICD-10-CM | POA: Diagnosis not present

## 2021-11-21 DIAGNOSIS — N185 Chronic kidney disease, stage 5: Secondary | ICD-10-CM | POA: Diagnosis not present

## 2021-11-21 DIAGNOSIS — E1122 Type 2 diabetes mellitus with diabetic chronic kidney disease: Secondary | ICD-10-CM | POA: Insufficient documentation

## 2021-11-21 DIAGNOSIS — D72821 Monocytosis (symptomatic): Secondary | ICD-10-CM | POA: Insufficient documentation

## 2021-11-21 LAB — CBC WITH DIFFERENTIAL/PLATELET
Abs Immature Granulocytes: 0.04 10*3/uL (ref 0.00–0.07)
Basophils Absolute: 0.1 10*3/uL (ref 0.0–0.1)
Basophils Relative: 1 %
Eosinophils Absolute: 0.4 10*3/uL (ref 0.0–0.5)
Eosinophils Relative: 5 %
HCT: 25.4 % — ABNORMAL LOW (ref 39.0–52.0)
Hemoglobin: 8 g/dL — ABNORMAL LOW (ref 13.0–17.0)
Immature Granulocytes: 1 %
Lymphocytes Relative: 17 %
Lymphs Abs: 1.5 10*3/uL (ref 0.7–4.0)
MCH: 29.3 pg (ref 26.0–34.0)
MCHC: 31.5 g/dL (ref 30.0–36.0)
MCV: 93 fL (ref 80.0–100.0)
Monocytes Absolute: 1.2 10*3/uL — ABNORMAL HIGH (ref 0.1–1.0)
Monocytes Relative: 14 %
Neutro Abs: 5.5 10*3/uL (ref 1.7–7.7)
Neutrophils Relative %: 62 %
Platelets: 267 10*3/uL (ref 150–400)
RBC: 2.73 MIL/uL — ABNORMAL LOW (ref 4.22–5.81)
RDW: 14.2 % (ref 11.5–15.5)
WBC: 8.6 10*3/uL (ref 4.0–10.5)
nRBC: 0 % (ref 0.0–0.2)

## 2021-11-21 LAB — GLUCOSE, CAPILLARY: Glucose-Capillary: 201 mg/dL — ABNORMAL HIGH (ref 70–99)

## 2021-11-21 MED ORDER — FENTANYL CITRATE (PF) 100 MCG/2ML IJ SOLN
INTRAMUSCULAR | Status: AC | PRN
  Administered 2021-11-21: 50 ug via INTRAVENOUS

## 2021-11-21 MED ORDER — MIDAZOLAM HCL 2 MG/2ML IJ SOLN
INTRAMUSCULAR | Status: AC | PRN
  Administered 2021-11-21: .5 mg via INTRAVENOUS

## 2021-11-21 MED ORDER — SODIUM CHLORIDE 0.9 % IV SOLN: INTRAVENOUS | Status: DC

## 2021-11-21 MED ORDER — MIDAZOLAM HCL 2 MG/2ML IJ SOLN
INTRAMUSCULAR | Status: AC
  Filled 2021-11-21: qty 2

## 2021-11-21 MED ORDER — FENTANYL CITRATE (PF) 100 MCG/2ML IJ SOLN
INTRAMUSCULAR | Status: AC
  Filled 2021-11-21: qty 2

## 2021-11-21 NOTE — Discharge Instructions (Signed)

## 2021-11-21 NOTE — Procedures (Signed)
Interventional Radiology Procedure Note  Procedure: CT guided bone marrow aspiration and biopsy  Complications: None  EBL: < 10 mL  Findings: Aspirate and core biopsy performed of bone marrow in right iliac bone.  Plan: Bedrest supine x 1 hrs  Audris Speaker T. Copeland Lapier, M.D Pager:  [phone]   

## 2021-11-22 LAB — SURGICAL PATHOLOGY

## 2021-11-24 ENCOUNTER — Telehealth: Payer: Self-pay | Admitting: Physician Assistant

## 2021-11-24 DIAGNOSIS — D472 Monoclonal gammopathy: Secondary | ICD-10-CM

## 2021-11-24 NOTE — Telephone Encounter (Signed)
I spoke to [name] and called his nephew, [name] to review the bone marrow biopsy results from 11/21/2021. Unfortunately, there was insufficient material to finalize a diagnosis. Hence, we will need to repeat the bone marrow biopsy. I will reach out to IR team to schedule another biopsy. Additionally, we will schedule labs the day of the biopsy to complete workup for monoclonal gammopathy with sFLC and UPEP. Additionally, we will order bone survey to check for lytic lesions. Patient expressed understanding of the plan provided.

## 2021-11-29 ENCOUNTER — Other Ambulatory Visit (HOSPITAL_COMMUNITY): Payer: Medicare Other

## 2021-12-21 ENCOUNTER — Inpatient Hospital Stay: Payer: Medicare Other | Attending: Physician Assistant

## 2021-12-21 ENCOUNTER — Other Ambulatory Visit: Payer: Self-pay

## 2021-12-21 ENCOUNTER — Ambulatory Visit (HOSPITAL_COMMUNITY): Admission: RE | Admit: 2021-12-21 | Payer: Medicare Other | Source: Ambulatory Visit

## 2021-12-21 ENCOUNTER — Encounter: Payer: Medicare Other | Admitting: Internal Medicine

## 2021-12-21 ENCOUNTER — Other Ambulatory Visit: Payer: Self-pay | Admitting: Radiology

## 2021-12-21 DIAGNOSIS — D638 Anemia in other chronic diseases classified elsewhere: Secondary | ICD-10-CM | POA: Diagnosis not present

## 2021-12-21 DIAGNOSIS — E119 Type 2 diabetes mellitus without complications: Secondary | ICD-10-CM | POA: Diagnosis not present

## 2021-12-21 DIAGNOSIS — D649 Anemia, unspecified: Secondary | ICD-10-CM

## 2021-12-21 DIAGNOSIS — Z8673 Personal history of transient ischemic attack (TIA), and cerebral infarction without residual deficits: Secondary | ICD-10-CM | POA: Diagnosis not present

## 2021-12-21 DIAGNOSIS — Z79899 Other long term (current) drug therapy: Secondary | ICD-10-CM | POA: Diagnosis not present

## 2021-12-21 DIAGNOSIS — D472 Monoclonal gammopathy: Secondary | ICD-10-CM | POA: Insufficient documentation

## 2021-12-21 DIAGNOSIS — Z888 Allergy status to other drugs, medicaments and biological substances status: Secondary | ICD-10-CM | POA: Insufficient documentation

## 2021-12-22 ENCOUNTER — Ambulatory Visit (HOSPITAL_COMMUNITY)
Admission: RE | Admit: 2021-12-22 | Discharge: 2021-12-22 | Disposition: A | Discharge status: Home / Self Care | Payer: Medicare Other | Source: Ambulatory Visit | Attending: Physician Assistant | Admitting: Physician Assistant

## 2021-12-22 ENCOUNTER — Other Ambulatory Visit: Payer: Self-pay

## 2021-12-22 ENCOUNTER — Encounter (HOSPITAL_COMMUNITY): Payer: Self-pay

## 2021-12-22 DIAGNOSIS — Z8673 Personal history of transient ischemic attack (TIA), and cerebral infarction without residual deficits: Secondary | ICD-10-CM | POA: Diagnosis not present

## 2021-12-22 DIAGNOSIS — N189 Chronic kidney disease, unspecified: Secondary | ICD-10-CM | POA: Diagnosis not present

## 2021-12-22 DIAGNOSIS — Z85048 Personal history of other malignant neoplasm of rectum, rectosigmoid junction, and anus: Secondary | ICD-10-CM | POA: Diagnosis not present

## 2021-12-22 DIAGNOSIS — D472 Monoclonal gammopathy: Secondary | ICD-10-CM | POA: Insufficient documentation

## 2021-12-22 DIAGNOSIS — I129 Hypertensive chronic kidney disease with stage 1 through stage 4 chronic kidney disease, or unspecified chronic kidney disease: Secondary | ICD-10-CM | POA: Insufficient documentation

## 2021-12-22 DIAGNOSIS — D649 Anemia, unspecified: Secondary | ICD-10-CM

## 2021-12-22 DIAGNOSIS — Z794 Long term (current) use of insulin: Secondary | ICD-10-CM | POA: Diagnosis not present

## 2021-12-22 DIAGNOSIS — D631 Anemia in chronic kidney disease: Secondary | ICD-10-CM | POA: Insufficient documentation

## 2021-12-22 DIAGNOSIS — E1122 Type 2 diabetes mellitus with diabetic chronic kidney disease: Secondary | ICD-10-CM | POA: Diagnosis not present

## 2021-12-22 LAB — CBC WITH DIFFERENTIAL/PLATELET
Abs Immature Granulocytes: 0.03 10*3/uL (ref 0.00–0.07)
Basophils Absolute: 0 10*3/uL (ref 0.0–0.1)
Basophils Relative: 1 %
Eosinophils Absolute: 0.4 10*3/uL (ref 0.0–0.5)
Eosinophils Relative: 6 %
HCT: 24.8 % — ABNORMAL LOW (ref 39.0–52.0)
Hemoglobin: 7.8 g/dL — ABNORMAL LOW (ref 13.0–17.0)
Immature Granulocytes: 0 %
Lymphocytes Relative: 24 %
Lymphs Abs: 1.7 10*3/uL (ref 0.7–4.0)
MCH: 29.1 pg (ref 26.0–34.0)
MCHC: 31.5 g/dL (ref 30.0–36.0)
MCV: 92.5 fL (ref 80.0–100.0)
Monocytes Absolute: 1.1 10*3/uL — ABNORMAL HIGH (ref 0.1–1.0)
Monocytes Relative: 16 %
Neutro Abs: 3.8 10*3/uL (ref 1.7–7.7)
Neutrophils Relative %: 53 %
Platelets: 257 10*3/uL (ref 150–400)
RBC: 2.68 MIL/uL — ABNORMAL LOW (ref 4.22–5.81)
RDW: 14.4 % (ref 11.5–15.5)
WBC: 7.1 10*3/uL (ref 4.0–10.5)
nRBC: 0 % (ref 0.0–0.2)

## 2021-12-22 LAB — KAPPA/LAMBDA LIGHT CHAINS
Kappa free light chain: 125 mg/L — ABNORMAL HIGH (ref 3.3–19.4)
Kappa, lambda light chain ratio: 1.81 — ABNORMAL HIGH (ref 0.26–1.65)
Lambda free light chains: 69.2 mg/L — ABNORMAL HIGH (ref 5.7–26.3)

## 2021-12-22 LAB — GLUCOSE, CAPILLARY: Glucose-Capillary: 163 mg/dL — ABNORMAL HIGH (ref 70–99)

## 2021-12-22 MED ORDER — FENTANYL CITRATE (PF) 100 MCG/2ML IJ SOLN
INTRAMUSCULAR | Status: AC | PRN
  Administered 2021-12-22: 50 ug via INTRAVENOUS

## 2021-12-22 MED ORDER — SODIUM CHLORIDE 0.9 % IV SOLN: INTRAVENOUS | Status: DC

## 2021-12-22 MED ORDER — FENTANYL CITRATE (PF) 100 MCG/2ML IJ SOLN
INTRAMUSCULAR | Status: AC | PRN
  Administered 2021-12-22: 25 ug via INTRAVENOUS

## 2021-12-22 MED ORDER — NALOXONE HCL 0.4 MG/ML IJ SOLN
INTRAMUSCULAR | Status: AC
  Filled 2021-12-22: qty 1

## 2021-12-22 MED ORDER — MIDAZOLAM HCL 2 MG/2ML IJ SOLN
INTRAMUSCULAR | Status: AC
  Filled 2021-12-22: qty 2

## 2021-12-22 MED ORDER — MIDAZOLAM HCL 2 MG/2ML IJ SOLN
INTRAMUSCULAR | Status: AC | PRN
  Administered 2021-12-22: .5 mg via INTRAVENOUS

## 2021-12-22 MED ORDER — LIDOCAINE HCL 1 % IJ SOLN
INTRAMUSCULAR | Status: AC | PRN
  Administered 2021-12-22: 10 mL via INTRADERMAL

## 2021-12-22 MED ORDER — FENTANYL CITRATE (PF) 100 MCG/2ML IJ SOLN
INTRAMUSCULAR | Status: AC
  Filled 2021-12-22: qty 2

## 2021-12-22 MED ORDER — FLUMAZENIL 0.5 MG/5ML IV SOLN
INTRAVENOUS | Status: AC
  Filled 2021-12-22: qty 5

## 2021-12-22 NOTE — Procedures (Signed)
Interventional Radiology Procedure Note  Procedure: CT guided bone marrow aspiration and biopsy  Complications: None  EBL: < 10 mL  Findings: Aspirate and core biopsy performed of bone marrow in left iliac bone.  Plan: Bedrest supine x 1 hrs  Nesreen Albano T. Kathlene Cote, M.D Pager:  [phone]

## 2021-12-22 NOTE — Discharge Instructions (Addendum)
Bone Marrow Aspiration and Bone Marrow Biopsy, Adult, Care After  Please call Interventional Radiology clinic [phone] with any questions or concern You may remove your dressing and shower tomorrow.  This sheet gives you information about how to care for yourself after your procedure. Your health care provider may also give you more specific instructions. If you have problems or questions, contact your health careprovider. What can I expect after the procedure? After the procedure, it is common to have: Mild pain and tenderness. Swelling. Bruising. Follow these instructions at home: Puncture site care Follow instructions from your health care provider about how to take care of the puncture site. Make sure you: Wash your hands with soap and water before and after you change your bandage (dressing). If soap and water are not available, use hand sanitizer. Change your dressing as told by your health care provider. Check your puncture site every day for signs of infection. Check for: More redness, swelling, or pain. Fluid or blood. Warmth. Pus or a bad smell.  Activity Return to your normal activities as told by your health care provider. Ask your health care provider what activities are safe for you. Do not lift anything that is heavier than 10 lb (4.5 kg), or the limit that you are told, until your health care provider says that it is safe. Do not drive for 24 hours if you were given a sedative during your procedure. General instructions Take over-the-counter and prescription medicines only as told by your health care provider. Do not take baths, swim, or use a hot tub until your health care provider approves. Ask your health care provider if you may take showers. You may only be allowed to take sponge baths. If directed, put ice on the affected area. To do this: Put ice in a plastic bag. Place a towel between your skin and the bag. Leave the ice on for 20 minutes, 2-3 times a  day. Keep all follow-up visits as told by your health care provider. This is important.  Contact a health care provider if: Your pain is not controlled with medicine. You have a fever. You have more redness, swelling, or pain around the puncture site. You have fluid or blood coming from the puncture site. Your puncture site feels warm to the touch. You have pus or a bad smell coming from the puncture site. Summary After the procedure, it is common to have mild pain, tenderness, swelling, and bruising. Follow instructions from your health care provider about how to take care of the puncture site and what activities are safe for you. Take over-the-counter and prescription medicines only as told by your health care provider. Contact a health care provider if you have any signs of infection, such as fluid or blood coming from the puncture site. This information is not intended to replace advice given to you by your health care provider. Make sure you discuss any questions you have with your healthcare provider. Document Revised: 10/14/2018 Document Reviewed: 10/14/2018 Elsevier Patient Education  [place]. Moderate Conscious Sedation, Adult, Care After This sheet gives you information about how to care for yourself after your procedure. Your health care provider may also give you more specific instructions. If you have problems or questions, contact your health careprovider. What can I expect after the procedure? After the procedure, it is common to have: Sleepiness for several hours. Impaired judgment for several hours. Difficulty with balance. Vomiting if you eat too soon. Follow these instructions at home: For the  time period you were told by your health care provider: Rest. Do not participate in activities where you could fall or become injured. Do not drive or use machinery. Do not drink alcohol. Do not take sleeping pills or medicines that cause drowsiness. Do not make  important decisions or sign legal documents. Do not take care of children on your own. Eating and drinking  Follow the diet recommended by your health care provider. Drink enough fluid to keep your urine pale yellow. If you vomit: Drink water, juice, or soup when you can drink without vomiting. Make sure you have [place] or no nausea before eating solid foods.  General instructions Take over-the-counter and prescription medicines only as told by your health care provider. Have a responsible adult stay with you for the time you are told. It is important to have someone help care for you until you are awake and alert. Do not smoke. Keep all follow-up visits as told by your health care provider. This is important. Contact a health care provider if: You are still sleepy or having trouble with balance after 24 hours. You feel light-headed. You keep feeling nauseous or you keep vomiting. You develop a rash. You have a fever. You have redness or swelling around the IV site. Get help right away if: You have trouble breathing. You have new-onset confusion at home. Summary After the procedure, it is common to feel sleepy, have impaired judgment, or feel nauseous if you eat too soon. Rest after you get home. Know the things you should not do after the procedure. Follow the diet recommended by your health care provider and drink enough fluid to keep your urine pale yellow. Get help right away if you have trouble breathing or new-onset confusion at home. This information is not intended to replace advice given to you by your health care provider. Make sure you discuss any questions you have with your healthcare provider. Document Revised: 09/25/2019 Document Reviewed: 04/23/2019 Elsevier Patient Education  2022 [employer].

## 2021-12-22 NOTE — H&P (Signed)
Referring Physician(s): Dorsey,J  Supervising Physician: Aletta Edouard  Patient Status:  [facility]  Chief Complaint:  "I'm here for a bone marrow biopsy"   Subjective: Patient familiar to IR service from bone marrow biopsy on 11/21/2021.  He has a history of left nephrectomy, diabetes, hypertension, prior stroke, chronic kidney disease, rectal cancer, monoclonal gammopathy and now with worsening anemia.  There was insufficient material on his recent bone marrow biopsy and he presents today for repeat bone marrow biopsy for further evaluation. He currently denies fever,HA,CP, dyspnea, cough, abd/back pain,N/V or bleeding.   Past Medical History:  Diagnosis Date   Cancer ([place])    Diabetes mellitus without complication ([place])    Hypertension    Stroke [facility])    Past Surgical History:  Procedure Laterality Date   BIOPSY  02/18/2020   Procedure: BIOPSY;  Surgeon: [name], MD;  Location: [facility] ENDOSCOPY;  Service: Gastroenterology;;   COLON SURGERY     COLONOSCOPY  02/18/2020   COLONOSCOPY WITH PROPOFOL N/A 02/18/2020   Procedure: COLONOSCOPY WITH PROPOFOL;  Surgeon: [name], MD;  Location: [place];  Service: Gastroenterology;  Laterality: N/A;   CYSTOSCOPY W/ URETERAL STENT PLACEMENT Right 02/03/2020   Procedure: CYSTOSCOPY WITH RETROGRADE PYELOGRAM/URETERAL DOUBLE J STENT PLACEMENT;  Surgeon: [name], MD;  Location: [place];  Service: Urology;  Laterality: Right;   ESOPHAGOGASTRODUODENOSCOPY (EGD) WITH PROPOFOL N/A 02/18/2020   Procedure: ESOPHAGOGASTRODUODENOSCOPY (EGD) WITH PROPOFOL;  Surgeon: [name], MD;  Location: [place];  Service: Gastroenterology;  Laterality: N/A;   POLYPECTOMY  02/18/2020   Procedure: POLYPECTOMY;  Surgeon: [name], MD;  Location: [facility] ENDOSCOPY;  Service: Gastroenterology;;      Allergies: Lisinopril and Semaglutide  Medications: Prior to Admission medications   Medication Sig Start Date End  Date Taking? Authorizing Provider  Cholecalciferol (VITAMIN D3) 50 MCG (2000 UT) TABS Take 2,000 Units by mouth daily.   Yes [provider]  diltiazem (TIAZAC) 240 MG 24 hr capsule Take 240 mg by mouth daily.   Yes [provider]  hydrALAZINE (APRESOLINE) 100 MG tablet Take 100 mg by mouth 3 (three) times daily.   Yes [provider]  insulin aspart (NOVOLOG) 100 UNIT/ML injection Inject 0-6 Units into the skin 3 (three) times daily with meals. 08/19/21  Yes Elgergawy, Silver Huguenin, MD  insulin glargine-yfgn (SEMGLEE) 100 UNIT/ML injection Inject 8 Units into the skin daily.   Yes [provider]  latanoprost (XALATAN) 0.005 % ophthalmic solution Place 1 drop into both eyes every evening.   Yes [provider]  Multiple Vitamin (MULTIVITAMIN WITH MINERALS) TABS tablet Take 1 tablet by mouth daily. 02/22/20  Yes Antonieta Pert, MD  sodium bicarbonate 650 MG tablet Take 1 tablet (650 mg total) by mouth 2 (two) times daily. Patient taking differently: Take 1,300 mg by mouth 2 (two) times daily. 02/21/20  Yes Antonieta Pert, MD  glucose 4 GM chewable tablet Chew 4 tablets by mouth See admin instructions. Chew 4 tablets (16 g) by mouth as needed for low blood sugar, repeat every 15 minutes if blood sugar is less than 70    [provider]     Vital Signs: BP (!) 163/60   Pulse 64   Temp 98 F (36.7 C) (Oral)   Resp 16   Ht 5' 7"  (1.702 m)   Wt 149 lb 14.6 oz (68 kg)   SpO2 100%   BMI 23.48 kg/m   Physical Exam awake/alert; chest- distant  but clear BS bilat; heart- RRR; abd- soft,+BS,NT; LLE pretibial edema noted, no RLE edema  Imaging: No results found.  Labs:  CBC: Recent Labs    08/16/21 0452 08/17/21 0251 10/11/21 1640 11/21/21 0930  WBC 23.2* 17.0* 7.5 8.6  HGB 10.2* 10.1* 8.0* 8.0*  HCT 31.2* 30.4* 25.4* 25.4*  PLT 237 212 278 267    COAGS: No results for input(s): "INR", "APTT" in the last 8760 hours.  BMP: Recent Labs     08/14/21 2259 08/15/21 1426 08/16/21 0452 08/17/21 0251 10/11/21 1640  NA 143  --  143 144 138  K 4.4  --  4.5 4.0 4.5  CL 110  --  108 109 105  CO2 21*  --  22 24 22   GLUCOSE 191*  --  109* 113* 203*  BUN 66*  --  63* 60* 51*  CALCIUM 8.3*  --  8.3* 8.3* 8.8*  CREATININE 5.37* 5.93* 5.37* 5.37* 5.76*  GFRNONAA 10* 9* 10* 10* 9*    LIVER FUNCTION TESTS: Recent Labs    08/09/21 0704 08/10/21 0351 08/14/21 2259 10/11/21 1640  BILITOT 0.3 0.2* 0.6 0.4  AST 15 19 13* 12*  ALT 9 12 20 8   ALKPHOS 57 48 60 67  PROT 5.3* 4.9* 6.4* 7.2  ALBUMIN 1.9* 1.8* 2.2* 3.5    Assessment and Plan: Patient familiar to IR service from bone marrow biopsy on 11/21/2021.  He has a history of left nephrectomy, diabetes, hypertension, prior stroke, chronic kidney disease, rectal cancer, monoclonal gammopathy and now with worsening anemia.  There was insufficient material on his recent bone marrow biopsy and he presents today for repeat bone marrow biopsy for further evaluation.Risks and benefits of procedure was discussed with the patient including, but not limited to bleeding, infection, damage to adjacent structures or low yield requiring additional tests.  All of the questions were answered and there is agreement to proceed.  Consent signed and in chart.    Electronically Signed: D. Rowe Robert, PA-C 12/22/2021, 8:02 AM   I spent a total of 15 Minutes at the the patient's bedside AND on the patient's hospital floor or unit, greater than 50% of which was counseling/coordinating care for CT guided bone marrow biopsy

## 2021-12-27 LAB — SURGICAL PATHOLOGY

## 2021-12-27 NOTE — Progress Notes (Signed)
This encounter was created in error - please disregard.

## 2021-12-28 ENCOUNTER — Other Ambulatory Visit: Payer: Self-pay

## 2021-12-28 ENCOUNTER — Inpatient Hospital Stay: Payer: Medicare Other | Admitting: Hematology and Oncology

## 2021-12-28 ENCOUNTER — Other Ambulatory Visit: Payer: Self-pay | Admitting: *Deleted

## 2021-12-28 ENCOUNTER — Other Ambulatory Visit: Payer: Self-pay | Admitting: Hematology and Oncology

## 2021-12-28 ENCOUNTER — Ambulatory Visit (HOSPITAL_COMMUNITY)
Admission: RE | Admit: 2021-12-28 | Discharge: 2021-12-28 | Disposition: A | Discharge status: Home / Self Care | Payer: Medicare Other | Source: Ambulatory Visit | Attending: Physician Assistant | Admitting: Physician Assistant

## 2021-12-28 VITALS — BP 147/56 | HR 79 | Temp 99.0°F | Resp 17 | Ht 67.0 in | Wt 152.0 lb

## 2021-12-28 DIAGNOSIS — D472 Monoclonal gammopathy: Secondary | ICD-10-CM

## 2021-12-28 DIAGNOSIS — D649 Anemia, unspecified: Secondary | ICD-10-CM

## 2021-12-31 NOTE — Progress Notes (Signed)
[facility] [phone]   [phone]  PROGRESS NOTE  Patient Care Team: Administration, Veterans as PCP - General Paul Curry, DO (Geriatric Medicine)  Hematological/Oncological History # IgM Monoclonal Gammopathy #Anemia of Chronic Disease  10/11/2021: establish care with Dr. Lorenso Bond  11/21/2021: bone marrow biopsy performed, inadequate sample 12/22/2021: bone marrow biopsy repeated, inadequate sample 12/28/2021: discussed results with Paul Bond. Patient opted Paul to undergo a 3rd bone marrow biopsy.   Interval History:  Paul Bond 81 y.o. male with medical history significant for an IgM monoclonal gammopathy and anemia of chronic disease who presents for a follow up visit. The patient's last visit was on 10/11/2021 at which time he established care. In the interim since the last visit he has undergone 2 bone marrow biopsies both of which provided an adequate sample and were nondiagnostic.  On exam today Paul Bond reports that the last bone marrow biopsy went just fine.  He reports did Paul have any pain or bleeding in the aftermath.  He reports overall he feels good today.  His energy is about a 7-10.  He is Paul having any issues with bleeding, bruising, or dark stools.  He notes he is Paul having any fevers, chills, sweats, nausea, vomiting or diarrhea.  Today we spent the bulk of our time discussing the results of the bone marrow biopsies.  Details of this conversation are noted below.   MEDICAL HISTORY:  Past Medical History:  Diagnosis Date   Cancer ([place])    Diabetes mellitus without complication ([place])    Hypertension    Stroke [facility])     SURGICAL HISTORY: Past Surgical History:  Procedure Laterality Date   BIOPSY  02/18/2020   Procedure: BIOPSY;  Surgeon: Paul Flock, MD;  Location: [place];  Service: Gastroenterology;;   COLON SURGERY     COLONOSCOPY  02/18/2020   COLONOSCOPY WITH PROPOFOL N/A 02/18/2020   Procedure:  COLONOSCOPY WITH PROPOFOL;  Surgeon: Paul Flock, MD;  Location: [place];  Service: Gastroenterology;  Laterality: N/A;   CYSTOSCOPY W/ URETERAL STENT PLACEMENT Right 02/03/2020   Procedure: CYSTOSCOPY WITH RETROGRADE PYELOGRAM/URETERAL DOUBLE J STENT PLACEMENT;  Surgeon: Paul Gallo, MD;  Location: [place];  Service: Urology;  Laterality: Right;   ESOPHAGOGASTRODUODENOSCOPY (EGD) WITH PROPOFOL N/A 02/18/2020   Procedure: ESOPHAGOGASTRODUODENOSCOPY (EGD) WITH PROPOFOL;  Surgeon: Paul Flock, MD;  Location: [place];  Service: Gastroenterology;  Laterality: N/A;   POLYPECTOMY  02/18/2020   Procedure: POLYPECTOMY;  Surgeon: Paul Flock, MD;  Location: [facility] ENDOSCOPY;  Service: Gastroenterology;;    SOCIAL HISTORY: Social History   Socioeconomic History   Marital status: Married    Spouse name: Paul Bond   Number of children: Paul Bond   Years of education: Paul Bond   Highest education level: Paul Bond  Occupational History   Paul Bond  Tobacco Use   Smoking status: Former   Smokeless tobacco: Never  Vaping Use   Vaping Use: Never used  Substance and Sexual Activity   Alcohol use: Never   Drug use: Never   Sexual activity: Paul Bond  Other Topics Concern   Paul Bond  Social History Narrative   Paul Bond   Social Determinants of Health   Financial Resource Strain: Paul Bond  Food Insecurity: Paul Bond  Transportation Needs: Paul Bond  Physical Activity: Paul Bond  Stress: Paul Bond  Social Connections: Paul on  Bond  Intimate Partner Violence: Paul Bond    FAMILY HISTORY: No family history on Bond.  ALLERGIES:  is allergic to lisinopril and semaglutide.  MEDICATIONS:  Current Outpatient Medications  Medication Sig Dispense Refill   Cholecalciferol (VITAMIN D3) 50 MCG (2000 UT) TABS Take 2,000 Units by mouth daily.     diltiazem (TIAZAC) 240 MG 24 hr capsule Take 240 mg by mouth daily.     glucose 4 GM  chewable tablet Chew 4 tablets by mouth See admin instructions. Chew 4 tablets (16 g) by mouth as needed for low blood sugar, repeat every 15 minutes if blood sugar is less than 70     hydrALAZINE (APRESOLINE) 100 MG tablet Take 100 mg by mouth 3 (three) times daily.     insulin aspart (NOVOLOG) 100 UNIT/ML injection Inject 0-6 Units into the skin 3 (three) times daily with meals. 10 mL 11   insulin glargine-yfgn (SEMGLEE) 100 UNIT/ML injection Inject 8 Units into the skin daily.     latanoprost (XALATAN) 0.005 % ophthalmic solution Place 1 drop into both eyes every evening.     Multiple Vitamin (MULTIVITAMIN WITH MINERALS) TABS tablet Take 1 tablet by mouth daily.     sodium bicarbonate 650 MG tablet Take 1 tablet (650 mg total) by mouth 2 (two) times daily. (Patient taking differently: Take 1,300 mg by mouth 2 (two) times daily.)     No current facility-administered medications for this visit.    REVIEW OF SYSTEMS:   Constitutional: ( - ) fevers, ( - )  chills , ( - ) night sweats Eyes: ( - ) blurriness of vision, ( - ) double vision, ( - ) watery eyes Ears, nose, mouth, throat, and face: ( - ) mucositis, ( - ) sore throat Respiratory: ( - ) cough, ( - ) dyspnea, ( - ) wheezes Cardiovascular: ( - ) palpitation, ( - ) chest discomfort, ( - ) lower extremity swelling Gastrointestinal:  ( - ) nausea, ( - ) heartburn, ( - ) change in bowel habits Skin: ( - ) abnormal skin rashes Lymphatics: ( - ) new lymphadenopathy, ( - ) easy bruising Neurological: ( - ) numbness, ( - ) tingling, ( - ) new weaknesses Behavioral/Psych: ( - ) mood change, ( - ) new changes  All other systems were reviewed with the patient and are negative.  PHYSICAL EXAMINATION:  Vitals:   12/28/21 1452  BP: (!) 147/56  Pulse: 79  Resp: 17  Temp: 99 F (37.2 C)  SpO2: 100%   Filed Weights   12/28/21 1452  Weight: 152 lb (68.9 kg)    GENERAL: Well-appearing elderly African-American male alert, no distress and  comfortable SKIN: skin color, texture, turgor are normal, no rashes or significant lesions EYES: conjunctiva are pink and non-injected, sclera clear LUNGS: clear to auscultation and percussion with normal breathing effort HEART: regular rate & rhythm and no murmurs and no lower extremity edema Musculoskeletal: no cyanosis of digits and no clubbing  PSYCH: alert & oriented x 3, fluent speech NEURO: no focal motor/sensory deficits  LABORATORY DATA:  I have reviewed the data as listed    Latest Ref Rng & Units 12/22/2021    7:50 AM 11/21/2021    9:30 AM 10/11/2021    4:40 PM  CBC  WBC 4.0 - 10.5 K/uL 7.1  8.6  7.5   Hemoglobin 13.0 - 17.0 g/dL 7.8  8.0  8.0   Hematocrit 39.0 - 52.0 % 24.8  25.4  25.4   Platelets 150 - 400 K/uL 257  267  278        Latest Ref Rng & Units 10/11/2021    4:40 PM 08/17/2021    2:51 AM 08/16/2021    4:52 AM  CMP  Glucose 70 - 99 mg/dL 203  113  109   BUN 8 - 23 mg/dL 51  60  63   Creatinine 0.61 - 1.24 mg/dL 5.76  5.37  5.37   Sodium 135 - 145 mmol/L 138  144  143   Potassium 3.5 - 5.1 mmol/L 4.5  4.0  4.5   Chloride 98 - 111 mmol/L 105  109  108   CO2 22 - 32 mmol/L 22  24  22    Calcium 8.9 - 10.3 mg/dL 8.8  8.3  8.3   Total Protein 6.5 - 8.1 g/dL 7.2     Total Bilirubin 0.3 - 1.2 mg/dL 0.4     Alkaline Phos 38 - 126 U/L 67     AST 15 - 41 U/L 12     ALT 0 - 44 U/L 8       Lab Results  Component Value Date   MPROTEIN 0.4 (H) 10/11/2021   Lab Results  Component Value Date   KPAFRELGTCHN 125.0 (H) 12/21/2021   LAMBDASER 69.2 (H) 12/21/2021   KAPLAMBRATIO 1.81 (H) 12/21/2021    RADIOGRAPHIC STUDIES: DG Bone Survey Met  Result Date: 12/28/2021 CLINICAL DATA:  Monoclonal gammopathy EXAM: METASTATIC BONE SURVEY COMPARISON:  12/22/2021 FINDINGS: Standard bone survey was performed. Calvarium: There are no acute or destructive bony lesions. Upper extremities: Frontal views of the upper extremities from the shoulders through the wrists are obtained.  There are no acute or destructive bony lesions. Symmetrical degenerative changes of the shoulders. Spine: Frontal and lateral views of the entire spine are obtained. There are no acute or destructive bony lesions. There is significant spondylosis throughout the entirety of the cervical spine, as well as at the L3-4, L4-5, and L5 levels. Chest: Cardiac silhouette is unremarkable. No airspace disease. Trace left pleural effusion versus pleural thickening. No pneumothorax. No acute or destructive bony lesions. Pelvis: Right ureteral stent is identified. Numerous surgical clips and staples are seen within the lower abdomen and pelvis. There are no acute or destructive bony lesions. Lower extremities: Frontal views of the lower extremities are obtained from the hips through the ankles. There are no acute or destructive bony lesions. Subcutaneous edema of the bilateral lower extremities. IMPRESSION: 1. No acute or destructive bony lesions. 2. Degenerative changes of the cervical spine, lumbar spine, and bilateral shoulders. 3. Trace left pleural effusion versus pleural thickening. Electronically Signed   By: Randa Ngo M.D.   On: 12/28/2021 19:46   CT Biopsy  Result Date: 12/22/2021 CLINICAL DATA:  Monoclonal gammopathy and status post bone marrow biopsy on 11/21/2021. The specimen was deemed suboptimal and insufficient by pathology due to lack bone marrow particles aspiration and lack of particles on core biopsy. EXAM: CT GUIDED BONE MARROW ASPIRATION AND BIOPSY ANESTHESIA/SEDATION: Moderate (conscious) sedation was employed during this procedure. A total of Versed 1.5 mg and Fentanyl 75 mcg was administered intravenously by radiology nursing. Moderate Sedation Time: 23 minutes. The patient's level of consciousness and vital signs were monitored continuously by radiology nursing throughout the procedure under my direct supervision. PROCEDURE: The procedure risks, benefits, and alternatives were explained to the  patient. Questions regarding the procedure were encouraged and answered. The patient understands and consents to the  procedure. A time out was performed prior to initiating the procedure. The left gluteal region was prepped with chlorhexidine. Sterile gown and sterile gloves were used for the procedure. Local anesthesia was provided with 1% Lidocaine. Under CT guidance, an 11 gauge On Control bone cutting needle was advanced from a posterior approach into the left iliac bone. Needle positioning was confirmed with CT. Initial non heparinized and heparinized aspirate samples were obtained of bone marrow. Core biopsy was performed via the On Control drill needle. COMPLICATIONS: None FINDINGS: The left iliac bone was chosen for sampling today as the right-side was sampled last time. Again noted was lack of grossly visible bone marrow particles on initial inspection of the non heparinized aspirate. Additional aspirate sample was obtained. Two separate core biopsy samples were obtained due to the lack of particles on the aspirate. IMPRESSION: 1. Repeat bone marrow biopsy performed of the left iliac bone with additional aspirate samples and to completely separate core biopsy samples obtained due to lack of visible bone marrow particles on the aspirate. 2. Additional sampling was performed today due to repeat nature of biopsy. If this sample is deemed insufficient, additional repeat biopsy is Paul recommended as yield will Paul be better if repeated again. Electronically Signed   By: Aletta Edouard M.D.   On: 12/22/2021 12:09   CT BONE MARROW BIOPSY & ASPIRATION  Result Date: 12/22/2021 CLINICAL DATA:  Monoclonal gammopathy and status post bone marrow biopsy on 11/21/2021. The specimen was deemed suboptimal and insufficient by pathology due to lack bone marrow particles aspiration and lack of particles on core biopsy. EXAM: CT GUIDED BONE MARROW ASPIRATION AND BIOPSY ANESTHESIA/SEDATION: Moderate (conscious) sedation was  employed during this procedure. A total of Versed 1.5 mg and Fentanyl 75 mcg was administered intravenously by radiology nursing. Moderate Sedation Time: 23 minutes. The patient's level of consciousness and vital signs were monitored continuously by radiology nursing throughout the procedure under my direct supervision. PROCEDURE: The procedure risks, benefits, and alternatives were explained to the patient. Questions regarding the procedure were encouraged and answered. The patient understands and consents to the procedure. A time out was performed prior to initiating the procedure. The left gluteal region was prepped with chlorhexidine. Sterile gown and sterile gloves were used for the procedure. Local anesthesia was provided with 1% Lidocaine. Under CT guidance, an 11 gauge On Control bone cutting needle was advanced from a posterior approach into the left iliac bone. Needle positioning was confirmed with CT. Initial non heparinized and heparinized aspirate samples were obtained of bone marrow. Core biopsy was performed via the On Control drill needle. COMPLICATIONS: None FINDINGS: The left iliac bone was chosen for sampling today as the right-side was sampled last time. Again noted was lack of grossly visible bone marrow particles on initial inspection of the non heparinized aspirate. Additional aspirate sample was obtained. Two separate core biopsy samples were obtained due to the lack of particles on the aspirate. IMPRESSION: 1. Repeat bone marrow biopsy performed of the left iliac bone with additional aspirate samples and to completely separate core biopsy samples obtained due to lack of visible bone marrow particles on the aspirate. 2. Additional sampling was performed today due to repeat nature of biopsy. If this sample is deemed insufficient, additional repeat biopsy is Paul recommended as yield will Paul be better if repeated again. Electronically Signed   By: Aletta Edouard M.D.   On: 12/22/2021 12:09     ASSESSMENT & PLAN Ara Mano Richeson 81 y.o. male  with medical history significant for an IgM monoclonal gammopathy and anemia of chronic disease who presents for a follow up visit.   Unfortunately we were Paul able to get adequate samples from these bone marrow's.  There are a couple possibilities for why this occurred.  Could be that the patient has anatomical abnormalities or issues with the bone that prevent adequate sampling.  Low suspicion that these are genuine samples from the bone marrow.  This is a markedly rare occurrence with our interventional radiology department.  I offered a third bone marrow biopsy versus observation with the patient.  He voiced his understanding and noted he would Paul want to proceed with another bone marrow biopsy at this time.  Therefore I think we can reasonably pursue erythropoietin therapy with prompt referral if he were to have new or worsening signs or symptoms concerning for progression of multiple myeloma.  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 --At this time we have low clinical suspicion for multiple myeloma. --Okay to proceed with erythropoietin shots per the [place]. --We will pursue metastatic survey today in order to assess for lytic lesions --In the event the patient were to have poor response to erythropoietin or signs or symptoms evident for multiple level we would recommend referral to our clinic --Return to clinic as needed  No orders of the defined types were placed in this encounter.   All questions were answered. The patient knows to call the clinic with any problems, questions or concerns.  A total of more than 30 minutes were spent on this encounter with face-to-face time and non-face-to-face time, including preparing to see the patient, ordering tests and/or medications, counseling the patient and coordination of care as outlined above.   Ledell Peoples,  MD Department of Hematology/Oncology [facility] Phone: [phone] Pager: [phone] Email: [email or web address]  12/31/2021 2:30 PM

## 2022-01-02 ENCOUNTER — Encounter (HOSPITAL_COMMUNITY): Payer: Self-pay

## 2022-01-02 LAB — UPEP/UIFE/LIGHT CHAINS/TP, 24-HR UR
% BETA, Urine: 19.7 %
ALPHA 1 URINE: 4.2 %
Albumin, U: 57.3 %
Alpha 2, Urine: 8.4 %
Free Kappa Lt Chains,Ur: 211.27 mg/L — ABNORMAL HIGH (ref 1.17–86.46)
Free Kappa/Lambda Ratio: 4.48 (ref 1.83–14.26)
Free Lambda Lt Chains,Ur: 47.18 mg/L — ABNORMAL HIGH (ref 0.27–15.21)
GAMMA GLOBULIN URINE: 10.4 %
Total Protein, Urine-Ur/day: 3769 mg/24 hr — ABNORMAL HIGH (ref 30–150)
Total Protein, Urine: 418.8 mg/dL
Total Volume: 900

## 2022-02-13 ENCOUNTER — Inpatient Hospital Stay (HOSPITAL_COMMUNITY)
Admission: EM | Admit: 2022-02-13 | Discharge: 2022-02-17 | DRG: 673 | Disposition: A | Discharge status: Home Health | Payer: Medicare Other | Attending: Internal Medicine | Admitting: Internal Medicine

## 2022-02-13 ENCOUNTER — Observation Stay (HOSPITAL_COMMUNITY): Payer: Medicare Other

## 2022-02-13 ENCOUNTER — Emergency Department (HOSPITAL_COMMUNITY): Payer: Medicare Other

## 2022-02-13 ENCOUNTER — Encounter (HOSPITAL_COMMUNITY): Payer: Self-pay

## 2022-02-13 ENCOUNTER — Other Ambulatory Visit: Payer: Self-pay

## 2022-02-13 DIAGNOSIS — Z978 Presence of other specified devices: Secondary | ICD-10-CM

## 2022-02-13 DIAGNOSIS — E611 Iron deficiency: Secondary | ICD-10-CM | POA: Diagnosis present

## 2022-02-13 DIAGNOSIS — Z8616 Personal history of COVID-19: Secondary | ICD-10-CM

## 2022-02-13 DIAGNOSIS — Z87891 Personal history of nicotine dependence: Secondary | ICD-10-CM

## 2022-02-13 DIAGNOSIS — N186 End stage renal disease: Secondary | ICD-10-CM | POA: Diagnosis not present

## 2022-02-13 DIAGNOSIS — Z888 Allergy status to other drugs, medicaments and biological substances status: Secondary | ICD-10-CM

## 2022-02-13 DIAGNOSIS — R531 Weakness: Secondary | ICD-10-CM

## 2022-02-13 DIAGNOSIS — Z79899 Other long term (current) drug therapy: Secondary | ICD-10-CM

## 2022-02-13 DIAGNOSIS — Z992 Dependence on renal dialysis: Secondary | ICD-10-CM

## 2022-02-13 DIAGNOSIS — E872 Acidosis, unspecified: Secondary | ICD-10-CM | POA: Diagnosis present

## 2022-02-13 DIAGNOSIS — E1121 Type 2 diabetes mellitus with diabetic nephropathy: Secondary | ICD-10-CM | POA: Diagnosis present

## 2022-02-13 DIAGNOSIS — R627 Adult failure to thrive: Secondary | ICD-10-CM | POA: Diagnosis present

## 2022-02-13 DIAGNOSIS — N1330 Unspecified hydronephrosis: Secondary | ICD-10-CM | POA: Diagnosis present

## 2022-02-13 DIAGNOSIS — D631 Anemia in chronic kidney disease: Secondary | ICD-10-CM | POA: Diagnosis present

## 2022-02-13 DIAGNOSIS — D472 Monoclonal gammopathy: Secondary | ICD-10-CM | POA: Diagnosis present

## 2022-02-13 DIAGNOSIS — N179 Acute kidney failure, unspecified: Secondary | ICD-10-CM | POA: Diagnosis present

## 2022-02-13 DIAGNOSIS — Z794 Long term (current) use of insulin: Secondary | ICD-10-CM

## 2022-02-13 DIAGNOSIS — Z6824 Body mass index (BMI) 24.0-24.9, adult: Secondary | ICD-10-CM

## 2022-02-13 DIAGNOSIS — E1122 Type 2 diabetes mellitus with diabetic chronic kidney disease: Secondary | ICD-10-CM | POA: Diagnosis present

## 2022-02-13 DIAGNOSIS — I120 Hypertensive chronic kidney disease with stage 5 chronic kidney disease or end stage renal disease: Principal | ICD-10-CM | POA: Diagnosis present

## 2022-02-13 DIAGNOSIS — Z960 Presence of urogenital implants: Secondary | ICD-10-CM | POA: Diagnosis present

## 2022-02-13 DIAGNOSIS — Z8673 Personal history of transient ischemic attack (TIA), and cerebral infarction without residual deficits: Secondary | ICD-10-CM

## 2022-02-13 DIAGNOSIS — R509 Fever, unspecified: Secondary | ICD-10-CM | POA: Diagnosis present

## 2022-02-13 DIAGNOSIS — I509 Heart failure, unspecified: Secondary | ICD-10-CM | POA: Diagnosis present

## 2022-02-13 DIAGNOSIS — Z85048 Personal history of other malignant neoplasm of rectum, rectosigmoid junction, and anus: Secondary | ICD-10-CM

## 2022-02-13 DIAGNOSIS — Z905 Acquired absence of kidney: Secondary | ICD-10-CM

## 2022-02-13 DIAGNOSIS — N138 Other obstructive and reflux uropathy: Secondary | ICD-10-CM | POA: Diagnosis present

## 2022-02-13 DIAGNOSIS — I10 Essential (primary) hypertension: Secondary | ICD-10-CM | POA: Diagnosis present

## 2022-02-13 DIAGNOSIS — E440 Moderate protein-calorie malnutrition: Secondary | ICD-10-CM | POA: Diagnosis present

## 2022-02-13 HISTORY — DX: Malignant neoplasm of rectum: C20

## 2022-02-13 HISTORY — DX: Presence of other specified devices: Z97.8

## 2022-02-13 LAB — CBC WITH DIFFERENTIAL/PLATELET
Abs Immature Granulocytes: 0.25 10*3/uL — ABNORMAL HIGH (ref 0.00–0.07)
Basophils Absolute: 0 10*3/uL (ref 0.0–0.1)
Basophils Relative: 0 %
Eosinophils Absolute: 0.1 10*3/uL (ref 0.0–0.5)
Eosinophils Relative: 1 %
HCT: 26.4 % — ABNORMAL LOW (ref 39.0–52.0)
Hemoglobin: 8.6 g/dL — ABNORMAL LOW (ref 13.0–17.0)
Immature Granulocytes: 3 %
Lymphocytes Relative: 10 %
Lymphs Abs: 1 10*3/uL (ref 0.7–4.0)
MCH: 28.9 pg (ref 26.0–34.0)
MCHC: 32.6 g/dL (ref 30.0–36.0)
MCV: 88.6 fL (ref 80.0–100.0)
Monocytes Absolute: 1.7 10*3/uL — ABNORMAL HIGH (ref 0.1–1.0)
Monocytes Relative: 17 %
Neutro Abs: 7 10*3/uL (ref 1.7–7.7)
Neutrophils Relative %: 69 %
Platelets: 265 10*3/uL (ref 150–400)
RBC: 2.98 MIL/uL — ABNORMAL LOW (ref 4.22–5.81)
RDW: 14.3 % (ref 11.5–15.5)
WBC: 10 10*3/uL (ref 4.0–10.5)
nRBC: 0 % (ref 0.0–0.2)

## 2022-02-13 LAB — COMPREHENSIVE METABOLIC PANEL
ALT: 9 U/L (ref 0–44)
AST: 12 U/L — ABNORMAL LOW (ref 15–41)
Albumin: 2.9 g/dL — ABNORMAL LOW (ref 3.5–5.0)
Alkaline Phosphatase: 56 U/L (ref 38–126)
Anion gap: 16 — ABNORMAL HIGH (ref 5–15)
BUN: 84 mg/dL — ABNORMAL HIGH (ref 8–23)
CO2: 20 mmol/L — ABNORMAL LOW (ref 22–32)
Calcium: 9 mg/dL (ref 8.9–10.3)
Chloride: 105 mmol/L (ref 98–111)
Creatinine, Ser: 8.68 mg/dL — ABNORMAL HIGH (ref 0.61–1.24)
GFR, Estimated: 6 mL/min — ABNORMAL LOW (ref >60)
Glucose, Bld: 142 mg/dL — ABNORMAL HIGH (ref 70–99)
Potassium: 4 mmol/L (ref 3.5–5.1)
Sodium: 141 mmol/L (ref 135–145)
Total Bilirubin: 0.7 mg/dL (ref 0.3–1.2)
Total Protein: 7.1 g/dL (ref 6.5–8.1)

## 2022-02-13 LAB — URINALYSIS, ROUTINE W REFLEX MICROSCOPIC
Bilirubin Urine: NEGATIVE
Glucose, UA: NEGATIVE mg/dL
Ketones, ur: 5 mg/dL — AB
Nitrite: NEGATIVE
Protein, ur: >=300 mg/dL — AB
Specific Gravity, Urine: 1.01 (ref 1.005–1.030)
WBC, UA: >50 WBC/hpf — ABNORMAL HIGH (ref 0–5)
pH: 7 (ref 5.0–8.0)

## 2022-02-13 LAB — TROPONIN I (HIGH SENSITIVITY)
Troponin I (High Sensitivity): 50 ng/L — ABNORMAL HIGH (ref <18)
Troponin I (High Sensitivity): 40 ng/L — ABNORMAL HIGH (ref <18)

## 2022-02-13 LAB — BRAIN NATRIURETIC PEPTIDE: B Natriuretic Peptide: 174.4 pg/mL — ABNORMAL HIGH (ref 0.0–100.0)

## 2022-02-13 LAB — LACTIC ACID, PLASMA
Lactic Acid, Venous: 0.8 mmol/L (ref 0.5–1.9)
Lactic Acid, Venous: 0.6 mmol/L (ref 0.5–1.9)

## 2022-02-13 LAB — LIPASE, BLOOD: Lipase: 33 U/L (ref 11–51)

## 2022-02-13 LAB — CBG MONITORING, ED: Glucose-Capillary: 126 mg/dL — ABNORMAL HIGH (ref 70–99)

## 2022-02-13 LAB — ETHANOL: Alcohol, Ethyl (B): <10 mg/dL (ref <10)

## 2022-02-13 MED ORDER — NEPRO/CARBSTEADY PO LIQD: 237.0000 mL | Freq: Three times a day (TID) | ORAL | Status: DC | PRN

## 2022-02-13 MED ORDER — INSULIN ASPART 100 UNIT/ML IJ SOLN
0.0000 [IU] | Freq: Three times a day (TID) | INTRAMUSCULAR | Status: DC
  Administered 2022-02-14 – 2022-02-16 (×4): 1 [IU] via SUBCUTANEOUS

## 2022-02-13 MED ORDER — HEPARIN SODIUM (PORCINE) 5000 UNIT/ML IJ SOLN
5000.0000 [IU] | Freq: Three times a day (TID) | INTRAMUSCULAR | Status: DC
  Administered 2022-02-13: 5000 [IU] via SUBCUTANEOUS
  Filled 2022-02-13: qty 1

## 2022-02-13 MED ORDER — HYDRALAZINE HCL 20 MG/ML IJ SOLN: 5.0000 mg | INTRAMUSCULAR | Status: DC | PRN

## 2022-02-13 MED ORDER — SODIUM BICARBONATE 650 MG PO TABS
1300.0000 mg | ORAL_TABLET | Freq: Two times a day (BID) | ORAL | Status: DC
  Administered 2022-02-13: 1300 mg via ORAL
  Filled 2022-02-13 (×3): qty 2

## 2022-02-13 MED ORDER — DILTIAZEM HCL ER COATED BEADS 240 MG PO CP24
240.0000 mg | ORAL_CAPSULE | Freq: Every day | ORAL | Status: DC
  Administered 2022-02-15 – 2022-02-17 (×3): 240 mg via ORAL
  Filled 2022-02-13 (×4): qty 1

## 2022-02-13 MED ORDER — DOCUSATE SODIUM 283 MG RE ENEM: 1.0000 | ENEMA | RECTAL | Status: DC | PRN

## 2022-02-13 MED ORDER — ZOLPIDEM TARTRATE 5 MG PO TABS: 5.0000 mg | ORAL_TABLET | Freq: Every evening | ORAL | Status: DC | PRN

## 2022-02-13 MED ORDER — HYDROXYZINE HCL 25 MG PO TABS: 25.0000 mg | ORAL_TABLET | Freq: Three times a day (TID) | ORAL | Status: DC | PRN

## 2022-02-13 MED ORDER — ACETAMINOPHEN 650 MG RE SUPP: 650.0000 mg | Freq: Four times a day (QID) | RECTAL | Status: DC | PRN

## 2022-02-13 MED ORDER — INSULIN GLARGINE-YFGN 100 UNIT/ML ~~LOC~~ SOLN
8.0000 [IU] | Freq: Every day | SUBCUTANEOUS | Status: DC
Start: 2022-02-14 — End: 2022-02-17
  Administered 2022-02-14 – 2022-02-17 (×4): 8 [IU] via SUBCUTANEOUS
  Filled 2022-02-13 (×4): qty 0.08

## 2022-02-13 MED ORDER — ONDANSETRON HCL 4 MG PO TABS: 4.0000 mg | ORAL_TABLET | Freq: Four times a day (QID) | ORAL | Status: DC | PRN

## 2022-02-13 MED ORDER — LATANOPROST 0.005 % OP SOLN
1.0000 [drp] | Freq: Every evening | OPHTHALMIC | Status: DC
Start: 2022-02-13 — End: 2022-02-17
  Administered 2022-02-14 – 2022-02-16 (×3): 1 [drp] via OPHTHALMIC
  Filled 2022-02-13: qty 2.5

## 2022-02-13 MED ORDER — ACETAMINOPHEN 325 MG PO TABS: 650.0000 mg | ORAL_TABLET | Freq: Four times a day (QID) | ORAL | Status: DC | PRN

## 2022-02-13 MED ORDER — SORBITOL 70 % SOLN: 30.0000 mL | Status: DC | PRN

## 2022-02-13 MED ORDER — CHLORHEXIDINE GLUCONATE CLOTH 2 % EX PADS
6.0000 | MEDICATED_PAD | Freq: Every day | CUTANEOUS | Status: DC
  Administered 2022-02-14 – 2022-02-17 (×4): 6 via TOPICAL

## 2022-02-13 MED ORDER — CAMPHOR-MENTHOL 0.5-0.5 % EX LOTN: 1.0000 | TOPICAL_LOTION | Freq: Three times a day (TID) | CUTANEOUS | Status: DC | PRN

## 2022-02-13 MED ORDER — SODIUM CHLORIDE 0.9 % IV BOLUS
500.0000 mL | Freq: Once | INTRAVENOUS | Status: AC
  Administered 2022-02-13: 500 mL via INTRAVENOUS

## 2022-02-13 MED ORDER — HYDRALAZINE HCL 50 MG PO TABS
100.0000 mg | ORAL_TABLET | Freq: Three times a day (TID) | ORAL | Status: DC
  Administered 2022-02-13 – 2022-02-16 (×9): 100 mg via ORAL
  Filled 2022-02-13 (×11): qty 2

## 2022-02-13 MED ORDER — ONDANSETRON HCL 4 MG/2ML IJ SOLN: 4.0000 mg | Freq: Four times a day (QID) | INTRAMUSCULAR | Status: DC | PRN

## 2022-02-13 MED ORDER — CALCIUM CARBONATE ANTACID 1250 MG/5ML PO SUSP: 500.0000 mg | Freq: Four times a day (QID) | ORAL | Status: DC | PRN
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
## 2022-02-13 NOTE — ED Notes (Signed)
Got patient into a gown on the monitor did ekg shown to Dr Vanita Panda patient is resting with call bell is resting

## 2022-02-13 NOTE — ED Triage Notes (Signed)
Weakness x1 week, SOB since last Friday. Pt states his urinary bag is suppose to be changed x1 month but the home health has only been changing x3 months.   Pt suppose to get urinary stent and urinary catheter changed on Sept 28.

## 2022-02-13 NOTE — ED Provider Notes (Signed)
[facility] EMERGENCY DEPARTMENT Provider Note   CSN: [id number] Arrival date & time: 02/13/22  1141     History  Chief Complaint  Patient presents with   Weakness   Shortness of Breath    Paul Bond is a 81 y.o. male.  HPI Patient with multiple medical issues including malnutrition, hypertension, stroke, CKD presents with weakness.  No focal pain, no new dyspnea, no nausea, there is baseline anorexia.  He has a chronic indwelling Foley catheter, notes no changes in his urine production.  No recent change in medication, diet, activity.  He simply states that all his" get up done got up and gone."    Home Medications Prior to Admission medications   Medication Sig Start Date End Date Taking? Authorizing Provider  Cholecalciferol (VITAMIN D3) 50 MCG (2000 UT) TABS Take 2,000 Units by mouth daily.    [provider]  diltiazem (TIAZAC) 240 MG 24 hr capsule Take 240 mg by mouth daily.    [provider]  glucose 4 GM chewable tablet Chew 4 tablets by mouth See admin instructions. Chew 4 tablets (16 g) by mouth as needed for low blood sugar, repeat every 15 minutes if blood sugar is less than 70    [provider]  hydrALAZINE (APRESOLINE) 100 MG tablet Take 100 mg by mouth 3 (three) times daily.    [provider]  insulin aspart (NOVOLOG) 100 UNIT/ML injection Inject 0-6 Units into the skin 3 (three) times daily with meals. 08/19/21   Elgergawy, Silver Huguenin, MD  insulin glargine-yfgn (SEMGLEE) 100 UNIT/ML injection Inject 8 Units into the skin daily.    [provider]  latanoprost (XALATAN) 0.005 % ophthalmic solution Place 1 drop into both eyes every evening.    [provider]  Multiple Vitamin (MULTIVITAMIN WITH MINERALS) TABS tablet Take 1 tablet by mouth daily. 02/22/20   Antonieta Pert, MD  sodium bicarbonate 650 MG tablet Take 1 tablet (650 mg total) by mouth 2 (two) times daily. Patient taking differently: Take  1,300 mg by mouth 2 (two) times daily. 02/21/20   Antonieta Pert, MD      Allergies    Lisinopril and Semaglutide    Review of Systems   Review of Systems  All other systems reviewed and are negative.   Physical Exam Updated Vital Signs BP (!) 180/69   Pulse 81   Temp 98.8 F (37.1 C) (Oral)   Resp (!) 4   Ht _0  (1.702 m)   Wt 70.8 kg   SpO2 99%   BMI 24.43 kg/m  Physical Exam Vitals and nursing note reviewed.  Constitutional:      General: He is not in acute distress.    Appearance: He is well-developed.  HENT:     Head: Normocephalic and atraumatic.  Eyes:     Conjunctiva/sclera: Conjunctivae normal.  Cardiovascular:     Rate and Rhythm: Normal rate and regular rhythm.  Pulmonary:     Effort: Pulmonary effort is normal. No respiratory distress.     Breath sounds: No stridor.  Abdominal:     General: There is no distension.  Genitourinary:    Comments: Urinary collection bag full of urine with some opacity Skin:    General: Skin is warm and dry.  Neurological:     Mental Status: He is alert and oriented to person, place, and time.     ED Results / Procedures / Treatments   Labs (all labs ordered are listed,  but only abnormal results are displayed) Labs Reviewed  COMPREHENSIVE METABOLIC PANEL - Abnormal; Notable for the following components:      Result Value   CO2 20 (*)    Glucose, Bld 142 (*)    BUN 84 (*)    Creatinine, Ser 8.68 (*)    Albumin 2.9 (*)    AST 12 (*)    GFR, Estimated 6 (*)    Anion gap 16 (*)    All other components within normal limits  BRAIN NATRIURETIC PEPTIDE - Abnormal; Notable for the following components:   B Natriuretic Peptide 174.4 (*)    All other components within normal limits  CBC WITH DIFFERENTIAL/PLATELET - Abnormal; Notable for the following components:   RBC 2.98 (*)    Hemoglobin 8.6 (*)    HCT 26.4 (*)    Monocytes Absolute 1.7 (*)    Abs Immature Granulocytes 0.25 (*)    All other components within normal  limits  TROPONIN I (HIGH SENSITIVITY) - Abnormal; Notable for the following components:   Troponin I (High Sensitivity) 50 (*)    All other components within normal limits  URINE CULTURE  ETHANOL  LACTIC ACID, PLASMA  LIPASE, BLOOD  LACTIC ACID, PLASMA  URINALYSIS, ROUTINE W REFLEX MICROSCOPIC  TROPONIN I (HIGH SENSITIVITY)    EKG EKG Interpretation  Date/Time:  Tuesday February 13 2022 11:44:46 EDT Ventricular Rate:  79 PR Interval:  145 QRS Duration: 126 QT Interval:  400 QTC Calculation: 459 R Axis:   -77 Text Interpretation: Sinus rhythm Nonspecific IVCD with LAD Left ventricular hypertrophy ST-t wave abnormality Abnormal ECG Confirmed by Carmin Muskrat [phone]) on 02/13/2022 12:27:19 PM  Radiology DG Chest Port 1 View  Result Date: 02/13/2022 CLINICAL DATA:  Weakness for 1 week, shortness of breath since last Friday EXAM: PORTABLE CHEST 1 VIEW COMPARISON:  Portable exam 1348 hours compared to 08/14/2021 FINDINGS: Normal heart size, mediastinal contours, and pulmonary vascularity. Atherosclerotic calcification aorta. Question pulmonary nodule versus nipple shallow RIGHT base. Remaining lungs clear. No pulmonary infiltrate, pleural effusion, or pneumothorax. Mild degenerative disc disease changes thoracic spine. IMPRESSION: Question RIGHT nipple shadow; repeat PA chest radiograph with markers recommended to exclude pulmonary nodule. Aortic Atherosclerosis (ICD10-I70.0). Electronically Signed   By: Lavonia Dana M.D.   On: 02/13/2022 14:14    Procedures Procedures    Medications Ordered in ED Medications  sodium chloride 0.9 % bolus 500 mL (has no administration in time range)    ED Course/ Medical Decision Making/ A&P This patient with a Hx of medical issues including chronic kidney disease, hypertension, diabetes, failure to thrive presents to the ED for concern of weakness, this involves an extensive number of treatment options, and is a complaint that carries with it a high  risk of complications and morbidity.    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 Lorenso Courier  11/21/2021: bone marrow biopsy performed, inadequate sample 12/22/2021: bone marrow biopsy repeated, inadequate sample 12/28/2021: discussed results with Mr. Sangha. Patient opted not to undergo a 3rd bone marrow biopsy.   After the initial evaluation, orders, including: Labs urine  continuous monitoring were initiated.   Patient placed on Cardiac and Pulse-Oximetry Monitors. The patient was maintained on a cardiac monitor.  The cardiac monitored showed an rhythm of 80 sinus normal The patient was also maintained on pulse oximetry. The readings were typically 100% room air normal   On repeat evaluation of the patient stayed the same  Lab Tests:  I personally interpreted labs.  The pertinent results include: Evidence for acute kidney injury with creatinine now almost 9, up substantially from earlier this year.  BUN 84, both suggestive of dehydration, though the patient is also on diuretics for heart failure, and his BNP is elevated of 170.  Imaging Studies ordered:  I independently visualized and interpreted imaging which showed no pneumonia on x-ray I agree with the radiologist interpretation  Dispostion / Final MDM:  After consideration of the diagnostic results and the patient's response to treatment, this adult male with multiple medical issues including CKD, MGUS,  with incomplete evaluation thus far, heart failure now presents with fatigue, is found to have evidence for acute kidney injury, complicated by patient's elevated BNP and his history of heart failure.  No evidence for concurrent infection, pneumonia, bacteremia, sepsis, but with his immunocompromise state, concern for AKI, heart failure as above patient received fluids, was admitted for further monitoring, management.  Final Clinical Impression(s) / ED Diagnoses Final diagnoses:  AKI (acute kidney injury) ([place])     Carmin Muskrat, MD 02/13/22 1429

## 2022-02-13 NOTE — H&P (Signed)
History and Physical    Patient: Paul Bond FAO:[id number] DOB: January 30, 1941 DOA: 02/13/2022 DOS: the patient was seen and examined on 02/13/2022 PCP: [facility]  Patient coming from: Home - lives with wife but she is in SNF now; NOK: Tamera Stands, [phone]; Daughter, Delsa Bern, [phone]   Chief Complaint: Weakness  HPI: Paul Bond is a 81 y.o. 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   He started feeling real bad on Friday.  He has been having problems with his kidneys and was told to come to the ER if he started feeling bad.  He has not started HD yet, when asked why he says "I don't know."  Mild SOB.  No CP or pain at all.  He has a foley catheter and it has been draining ok.  He was supposed to have it changed with a stent on 9/28 at the [place].  He does not have HD access.  [facility].  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  Creatinine up, BNP slightly increased, ?heart failure.  2 inadequate bone marrow biopsies, likely needs a third eventually.     Review of Systems: As mentioned in the history of present illness. All other systems reviewed and are negative. Past Medical History:  Diagnosis Date   Chronic indwelling Foley catheter    Diabetes mellitus without complication ([place])    Hypertension    Rectal cancer ([place])    Stroke [facility])    Past Surgical History:  Procedure Laterality Date   BIOPSY  02/18/2020   Procedure: BIOPSY;  Surgeon: [name], MD;  Location[facility] ENDOSCOPY;  Service: Gastroenterology;;   COLON SURGERY     COLONOSCOPY  02/18/2020   COLONOSCOPY WITH PROPOFOL N/A 02/18/2020   Procedure: COLONOSCOPY WITH PROPOFOL;  Surgeon: [name],  MD;  Location: [place];  Service: Gastroenterology;  Laterality: N/A;   CYSTOSCOPY W/ URETERAL STENT PLACEMENT Right 02/03/2020   Procedure: CYSTOSCOPY WITH RETROGRADE PYELOGRAM/URETERAL DOUBLE J STENT PLACEMENT;  Surgeon: [name], MD;  Location: [place];  Service: Urology;  Laterality: Right;   ESOPHAGOGASTRODUODENOSCOPY (EGD) WITH PROPOFOL N/A 02/18/2020   Procedure: ESOPHAGOGASTRODUODENOSCOPY (EGD) WITH PROPOFOL;  Surgeon: [name], MD;  Location: [place];  Service: Gastroenterology;  Laterality: N/A;   POLYPECTOMY  02/18/2020   Procedure: POLYPECTOMY;  Surgeon: [name], MD;  Location: [facility] ENDOSCOPY;  Service: Gastroenterology;;   Social History:  reports that he quit smoking about 49 years ago. His smoking use included cigarettes. He has a 15.00 pack-year smoking history. He has never used smokeless tobacco. He reports that he does not currently use alcohol. He reports that he does not use drugs.  Allergies  Allergen Reactions   Lisinopril Cough   Semaglutide Other (See Comments)    indigestion    History reviewed. No pertinent family history.  Prior to Admission medications   Medication Sig Start Date End Date Taking? Authorizing Provider  Cholecalciferol (VITAMIN D3) 50 MCG (2000 UT) TABS Take 2,000 Units by mouth daily.    [provider]  diltiazem (TIAZAC) 240 MG 24 hr capsule Take 240 mg by mouth daily.    [provider]  glucose 4 GM chewable tablet  Chew 4 tablets by mouth See admin instructions. Chew 4 tablets (16 g) by mouth as needed for low blood sugar, repeat every 15 minutes if blood sugar is less than 70    [provider]  hydrALAZINE (APRESOLINE) 100 MG tablet Take 100 mg by mouth 3 (three) times daily.    [provider]  insulin aspart (NOVOLOG) 100 UNIT/ML injection Inject 0-6 Units into the skin 3 (three) times daily with meals. 08/19/21   Elgergawy, Silver Huguenin, MD  insulin glargine-yfgn  (SEMGLEE) 100 UNIT/ML injection Inject 8 Units into the skin daily.    [provider]  latanoprost (XALATAN) 0.005 % ophthalmic solution Place 1 drop into both eyes every evening.    [provider]  Multiple Vitamin (MULTIVITAMIN WITH MINERALS) TABS tablet Take 1 tablet by mouth daily. 02/22/20   Antonieta Pert, MD  sodium bicarbonate 650 MG tablet Take 1 tablet (650 mg total) by mouth 2 (two) times daily. Patient taking differently: Take 1,300 mg by mouth 2 (two) times daily. 02/21/20   Antonieta Pert, MD    Physical Exam: Vitals:   02/13/22 1145 02/13/22 1230 02/13/22 1400 02/13/22 1604  BP: (!) 175/63 (!) 180/69 (!) 186/63 (!) 186/64  Pulse: 80 81 81 82  Resp: 13 13 (!) 4 18  Temp: 98.8 F (37.1 C)     TempSrc: Oral     SpO2: 100% 99% 99% 99%  Weight: 70.8 kg     Height: '5\' 7"'$  (1.702 m)      General:  Appears calm and comfortable and is in NAD, on RA Eyes:  EOMI, normal lids, iris ENT:  grossly normal hearing, lips & tongue, mmm; absent upper dentition Neck:  no LAD, masses or thyromegaly; no JVD Cardiovascular:  RRR, no m/r/g. 1+ LE edema.  Respiratory:   CTA bilaterally with no wheezes/rales/rhonchi.  Normal respiratory effort. Abdomen:  soft, NT, ND Skin:  no rash or induration seen on limited exam Musculoskeletal:  grossly normal tone BUE/BLE, good ROM, no bony abnormality Psychiatric:  grossly normal mood and affect, speech fluent and appropriate, AOx3 Neurologic:  CN 2-12 grossly intact, moves all extremities in coordinated fashion   Radiological Exams on Admission: Independently reviewed - see discussion in A/P where applicable  DG Chest Port 1 View  Result Date: 02/13/2022 CLINICAL DATA:  Weakness for 1 week, shortness of breath since last Friday EXAM: PORTABLE CHEST 1 VIEW COMPARISON:  Portable exam 1348 hours compared to 08/14/2021 FINDINGS: Normal heart size, mediastinal contours, and pulmonary vascularity. Atherosclerotic calcification aorta. Question  pulmonary nodule versus nipple shallow RIGHT base. Remaining lungs clear. No pulmonary infiltrate, pleural effusion, or pneumothorax. Mild degenerative disc disease changes thoracic spine. IMPRESSION: Question RIGHT nipple shadow; repeat PA chest radiograph with markers recommended to exclude pulmonary nodule. Aortic Atherosclerosis (ICD10-I70.0). Electronically Signed   By: Lavonia Dana M.D.   On: 02/13/2022 14:14    EKG: Independently reviewed.  NSR with rate 79; IVCD; LVH; nonspecific ST changes with no evidence of acute ischemia   Labs on Admission: I have personally reviewed the available labs and imaging studies at the time of the admission.  Pertinent labs:    Glucose 142 BUN 84/Creatinine 8.68/GFR 6; 73/7.580/7 on 8/30 Anion gap 16 BNP 174.4 HS troponin 50 Lactate 0.8, 0.6 WBC 10 Hgb 8.6 - stable UA: small Hgb, 5 ketones, large LE, >300 protein, rare bacteria, >50 WBC ETOH <10   Assessment and Plan: Principal Problem:   End stage renal disease (HCC) Active Problems:  Type 2 diabetes mellitus with diabetic nephropathy (HCC)   Chronic indwelling Foley catheter   Essential hypertension   Generalized weakness   Monoclonal gammopathy    New ESRD -Patient with known progressive CKD -Presenting with generalized weakness, no specific complaints -Appears to need initiation of HD now -Needs temporary HD catheter for initiation of HD -Will needs vascular access mapping for fistula -Will need clipping -Management as per nephrology  -Continue bicarbonate  Generalized weakness -As above, this is thought to be related to progression to ESRD -PT/OT consults requested  DM -Last A1c was 7.9 - not perfect control but reasonable in this circumstance -Continue Semglee -Cover with very sensitive-scale SSI   Indwelling foley -Not changed as frequently as it should be -While UTI is a possible reason for symptoms, progression to ESRD appears to be more likely at this time -Urine  culture is pending but UA appears similar to recent UAs done at the [place] -He is due for stent exchange soon  HTN -Continue diltiazem, hydralazine  H/o rectal cancer, current IgM monoclonal gammopathy -Remote h/o rectal CA -Has undergone 2 bone marrow biopsies for MGUS and both were nondiagnostic -Hgb appears to be stable at this time -This does not appear to be the cause of his current presentation, although outpatient onc f/u is encouraged    Advance Care Planning:   Code Status: Full Code   Consults: Nephrology; PT/OT; nutrition; TOC team  DVT Prophylaxis: Heparin  Family Communication: None present; I attempted to call his nephew and daughter and was unable to reach or leave a message for either by telephone at the time of admission  Severity of Illness: The appropriate patient status for this patient is OBSERVATION. Observation status is judged to be reasonable and necessary in order to provide the required intensity of service to ensure the patient's safety. The patient's presenting symptoms, physical exam findings, and initial radiographic and laboratory data in the context of their medical condition is felt to place them at decreased risk for further clinical deterioration. Furthermore, it is anticipated that the patient will be medically stable for discharge from the hospital within 2 midnights of admission.   Author: [name], MD 02/13/2022 4:47 PM  For on call review [email or web address].

## 2022-02-14 ENCOUNTER — Encounter (HOSPITAL_COMMUNITY): Payer: Medicare Other

## 2022-02-14 ENCOUNTER — Encounter (HOSPITAL_COMMUNITY): Payer: Self-pay | Admitting: Internal Medicine

## 2022-02-14 ENCOUNTER — Inpatient Hospital Stay (HOSPITAL_COMMUNITY): Payer: Medicare Other

## 2022-02-14 DIAGNOSIS — N138 Other obstructive and reflux uropathy: Secondary | ICD-10-CM | POA: Diagnosis present

## 2022-02-14 DIAGNOSIS — Z87891 Personal history of nicotine dependence: Secondary | ICD-10-CM | POA: Diagnosis not present

## 2022-02-14 DIAGNOSIS — D631 Anemia in chronic kidney disease: Secondary | ICD-10-CM | POA: Diagnosis present

## 2022-02-14 DIAGNOSIS — E611 Iron deficiency: Secondary | ICD-10-CM | POA: Diagnosis present

## 2022-02-14 DIAGNOSIS — Z8616 Personal history of COVID-19: Secondary | ICD-10-CM | POA: Diagnosis not present

## 2022-02-14 DIAGNOSIS — Z794 Long term (current) use of insulin: Secondary | ICD-10-CM | POA: Diagnosis not present

## 2022-02-14 DIAGNOSIS — Z905 Acquired absence of kidney: Secondary | ICD-10-CM | POA: Diagnosis not present

## 2022-02-14 DIAGNOSIS — N186 End stage renal disease: Secondary | ICD-10-CM | POA: Diagnosis present

## 2022-02-14 DIAGNOSIS — D472 Monoclonal gammopathy: Secondary | ICD-10-CM | POA: Diagnosis present

## 2022-02-14 DIAGNOSIS — Z888 Allergy status to other drugs, medicaments and biological substances status: Secondary | ICD-10-CM | POA: Diagnosis not present

## 2022-02-14 DIAGNOSIS — Z8673 Personal history of transient ischemic attack (TIA), and cerebral infarction without residual deficits: Secondary | ICD-10-CM | POA: Diagnosis not present

## 2022-02-14 DIAGNOSIS — N179 Acute kidney failure, unspecified: Secondary | ICD-10-CM | POA: Diagnosis present

## 2022-02-14 DIAGNOSIS — R627 Adult failure to thrive: Secondary | ICD-10-CM | POA: Diagnosis present

## 2022-02-14 DIAGNOSIS — I10 Essential (primary) hypertension: Secondary | ICD-10-CM | POA: Diagnosis not present

## 2022-02-14 DIAGNOSIS — Z992 Dependence on renal dialysis: Secondary | ICD-10-CM | POA: Diagnosis not present

## 2022-02-14 DIAGNOSIS — Z79899 Other long term (current) drug therapy: Secondary | ICD-10-CM | POA: Diagnosis not present

## 2022-02-14 DIAGNOSIS — Z6824 Body mass index (BMI) 24.0-24.9, adult: Secondary | ICD-10-CM | POA: Diagnosis not present

## 2022-02-14 DIAGNOSIS — I120 Hypertensive chronic kidney disease with stage 5 chronic kidney disease or end stage renal disease: Secondary | ICD-10-CM | POA: Diagnosis present

## 2022-02-14 DIAGNOSIS — E872 Acidosis, unspecified: Secondary | ICD-10-CM | POA: Diagnosis present

## 2022-02-14 DIAGNOSIS — I509 Heart failure, unspecified: Secondary | ICD-10-CM | POA: Diagnosis present

## 2022-02-14 DIAGNOSIS — Z85048 Personal history of other malignant neoplasm of rectum, rectosigmoid junction, and anus: Secondary | ICD-10-CM | POA: Diagnosis not present

## 2022-02-14 DIAGNOSIS — E1122 Type 2 diabetes mellitus with diabetic chronic kidney disease: Secondary | ICD-10-CM | POA: Diagnosis present

## 2022-02-14 DIAGNOSIS — R531 Weakness: Secondary | ICD-10-CM | POA: Diagnosis not present

## 2022-02-14 DIAGNOSIS — E440 Moderate protein-calorie malnutrition: Secondary | ICD-10-CM | POA: Diagnosis present

## 2022-02-14 DIAGNOSIS — N1330 Unspecified hydronephrosis: Secondary | ICD-10-CM | POA: Diagnosis present

## 2022-02-14 DIAGNOSIS — R509 Fever, unspecified: Secondary | ICD-10-CM | POA: Diagnosis present

## 2022-02-14 HISTORY — PX: IR FLUORO GUIDE CV LINE RIGHT: IMG2283

## 2022-02-14 HISTORY — PX: IR US GUIDE VASC ACCESS RIGHT: IMG2390

## 2022-02-14 LAB — BASIC METABOLIC PANEL
Anion gap: 14 (ref 5–15)
BUN: 79 mg/dL — ABNORMAL HIGH (ref 8–23)
CO2: 20 mmol/L — ABNORMAL LOW (ref 22–32)
Calcium: 8.5 mg/dL — ABNORMAL LOW (ref 8.9–10.3)
Chloride: 108 mmol/L (ref 98–111)
Creatinine, Ser: 8.19 mg/dL — ABNORMAL HIGH (ref 0.61–1.24)
GFR, Estimated: 6 mL/min — ABNORMAL LOW (ref >60)
Glucose, Bld: 177 mg/dL — ABNORMAL HIGH (ref 70–99)
Potassium: 3.7 mmol/L (ref 3.5–5.1)
Sodium: 142 mmol/L (ref 135–145)

## 2022-02-14 LAB — IRON AND TIBC
Iron: 41 ug/dL — ABNORMAL LOW (ref 45–182)
Saturation Ratios: 24 % (ref 17.9–39.5)
TIBC: 174 ug/dL — ABNORMAL LOW (ref 250–450)
UIBC: 133 ug/dL

## 2022-02-14 LAB — CBC
HCT: 26.3 % — ABNORMAL LOW (ref 39.0–52.0)
Hemoglobin: 8.3 g/dL — ABNORMAL LOW (ref 13.0–17.0)
MCH: 28.4 pg (ref 26.0–34.0)
MCHC: 31.6 g/dL (ref 30.0–36.0)
MCV: 90.1 fL (ref 80.0–100.0)
Platelets: 282 10*3/uL (ref 150–400)
RBC: 2.92 MIL/uL — ABNORMAL LOW (ref 4.22–5.81)
RDW: 14 % (ref 11.5–15.5)
WBC: 11 10*3/uL — ABNORMAL HIGH (ref 4.0–10.5)
nRBC: 0.2 % (ref 0.0–0.2)

## 2022-02-14 LAB — HEPATITIS B SURFACE ANTIGEN: Hepatitis B Surface Ag: NONREACTIVE

## 2022-02-14 LAB — HEPATITIS B CORE ANTIBODY, TOTAL: Hep B Core Total Ab: NONREACTIVE

## 2022-02-14 LAB — GLUCOSE, CAPILLARY
Glucose-Capillary: 186 mg/dL — ABNORMAL HIGH (ref 70–99)
Glucose-Capillary: 145 mg/dL — ABNORMAL HIGH (ref 70–99)
Glucose-Capillary: 143 mg/dL — ABNORMAL HIGH (ref 70–99)
Glucose-Capillary: 134 mg/dL — ABNORMAL HIGH (ref 70–99)

## 2022-02-14 LAB — HEPATITIS B SURFACE ANTIBODY,QUALITATIVE: Hep B S Ab: NONREACTIVE

## 2022-02-14 LAB — FERRITIN: Ferritin: 540 ng/mL — ABNORMAL HIGH (ref 24–336)

## 2022-02-14 LAB — HEPATITIS C ANTIBODY: HCV Ab: NONREACTIVE

## 2022-02-14 MED ORDER — ALTEPLASE 2 MG IJ SOLR: 2.0000 mg | Freq: Once | INTRAMUSCULAR | Status: DC | PRN

## 2022-02-14 MED ORDER — MIDAZOLAM HCL 2 MG/2ML IJ SOLN
INTRAMUSCULAR | Status: AC | PRN
  Administered 2022-02-14: 1 mg via INTRAVENOUS

## 2022-02-14 MED ORDER — MIDAZOLAM HCL 2 MG/2ML IJ SOLN
INTRAMUSCULAR | Status: AC
  Filled 2022-02-14: qty 2

## 2022-02-14 MED ORDER — ANTICOAGULANT SODIUM CITRATE 4% (200MG/5ML) IV SOLN: 5.0000 mL | Status: DC | PRN

## 2022-02-14 MED ORDER — CEFAZOLIN SODIUM-DEXTROSE 2-4 GM/100ML-% IV SOLN
INTRAVENOUS | Status: AC | PRN
  Administered 2022-02-14: 2 g via INTRAVENOUS

## 2022-02-14 MED ORDER — CEFAZOLIN SODIUM-DEXTROSE 2-4 GM/100ML-% IV SOLN
INTRAVENOUS | Status: AC
  Filled 2022-02-14: qty 100

## 2022-02-14 MED ORDER — CHLORHEXIDINE GLUCONATE CLOTH 2 % EX PADS: 6.0000 | MEDICATED_PAD | Freq: Every day | CUTANEOUS | Status: DC

## 2022-02-14 MED ORDER — HEPARIN SODIUM (PORCINE) 1000 UNIT/ML DIALYSIS
1000.0000 [IU] | INTRAMUSCULAR | Status: DC | PRN
  Administered 2022-02-14: 1000 [IU]
  Filled 2022-02-14 (×2): qty 1

## 2022-02-14 MED ORDER — FENTANYL CITRATE (PF) 100 MCG/2ML IJ SOLN
INTRAMUSCULAR | Status: AC | PRN
  Administered 2022-02-14: 50 ug via INTRAVENOUS

## 2022-02-14 MED ORDER — HEPARIN SODIUM (PORCINE) 1000 UNIT/ML IJ SOLN
INTRAMUSCULAR | Status: AC | PRN
  Administered 2022-02-14: 3.8 [IU] via INTRAVENOUS

## 2022-02-14 MED ORDER — HEPARIN SODIUM (PORCINE) 5000 UNIT/ML IJ SOLN
5000.0000 [IU] | Freq: Three times a day (TID) | INTRAMUSCULAR | Status: DC
  Administered 2022-02-14 – 2022-02-17 (×8): 5000 [IU] via SUBCUTANEOUS
  Filled 2022-02-14 (×8): qty 1

## 2022-02-14 MED ORDER — LIDOCAINE HCL 1 % IJ SOLN
INTRAMUSCULAR | Status: AC
  Filled 2022-02-14: qty 20

## 2022-02-14 MED ORDER — DARBEPOETIN ALFA 40 MCG/0.4ML IJ SOSY
40.0000 ug | PREFILLED_SYRINGE | INTRAMUSCULAR | Status: DC
  Administered 2022-02-14: 40 ug via INTRAVENOUS
  Filled 2022-02-14: qty 0.4

## 2022-02-14 MED ORDER — LIDOCAINE HCL (PF) 1 % IJ SOLN
INTRAMUSCULAR | Status: AC | PRN
  Administered 2022-02-14: 10 mL

## 2022-02-14 MED ORDER — FENTANYL CITRATE (PF) 100 MCG/2ML IJ SOLN
INTRAMUSCULAR | Status: AC
  Filled 2022-02-14: qty 2

## 2022-02-14 MED ORDER — HEPARIN SODIUM (PORCINE) 1000 UNIT/ML IJ SOLN
INTRAMUSCULAR | Status: AC
  Filled 2022-02-14: qty 10

## 2022-02-14 MED ORDER — CEFAZOLIN SODIUM-DEXTROSE 2-4 GM/100ML-% IV SOLN: 2.0000 g | INTRAVENOUS | Status: AC

## 2022-02-14 NOTE — Progress Notes (Signed)
PROGRESS NOTE    Paul Bond  ONG:[id number] DOB: 08/07/1940 DOA: 02/13/2022 [facility]CP: [facility]   Brief Narrative:  HPI: Paul Bond is a 81 y.o. 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   He started feeling real bad on Friday.  He has been having problems with his kidneys and was told to come to the ER if he started feeling bad.  He has not started HD yet, when asked why he says "I don't know."  Mild SOB.  No CP or pain at all.  He has a foley catheter and it has been draining ok.  He was supposed to have it changed with a stent on 9/28 at the [place].  He does not have HD access.  [facility] has been [facility]" about [facility] it.  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  Creatinine up, BNP slightly increased, ?heart failure.  2 inadequate bone marrow biopsies, likely needs a third eventually.  Assessment & Plan:   Principal Problem:   End stage renal disease ([place]) Active Problems:   Type 2 diabetes mellitus with diabetic nephropathy (HCC)   Chronic indwelling Foley catheter   Essential hypertension   Generalized weakness   Monoclonal gammopathy  Progressive CKD, now new ESRD: Nephrology on board, plan for [facility] and then HD today and will need clipp process after that.  Generalized weakness: Likely secondary to ESRD, patient is feeling better.  Hopefully he will improve after dialysis.  DM type II: -Last A1c was 7.9 -blood sugar fairly controlled.  Continue 8 units of Semglee and SSI.   Indwelling foley secondary to urinary retention. -Not changed as frequently as it should be, UA appears similar to recent UAs done at the [place]. I doubt UTI. -He is due for stent exchange soon.  Follow-up with urology as outpatient.   HTN: Likely  elevated, -Continue diltiazem, hydralazine as well as as needed hydralazine.  Monitor closely.   H/o rectal cancer, current IgM monoclonal gammopathy -Remote h/o rectal CA -Has undergone 2 bone marrow biopsies for MGUS and both were nondiagnostic -Hgb appears to be stable at this time -This does not appear to be the cause of his current presentation, although outpatient onc f/u is encouraged   DVT prophylaxis: heparin injection 5,000 Units Start: 02/14/22 2200   Code Status: Full Code  Family Communication:  None present at bedside.  Plan of care discussed with patient in length and he/she verbalized understanding and agreed with it.  Status is: Observation The patient will require care spanning > 2 midnights and should be moved to inpatient because: Nephrology is planning to start hemodialysis here.   Estimated body mass index is 24.41 kg/m as calculated from the following:   Height as of this encounter: '5\' 7"'$  (1.702 m).   Weight as of this encounter: 70.7 kg.    Nutritional Assessment: Body mass index is 24.41 kg/m.Marland Kitchen Seen by dietician.  I agree with the assessment and plan as outlined below: Nutrition Status:        . Skin Assessment: I have examined the patient's skin and I agree with the wound assessment as performed by the wound care RN as outlined below:    Consultants:  Nephrology  Procedures:  None  Antimicrobials:  Anti-infectives (  From admission, onward)    None         Subjective: Patient seen and examined.  He has no new complaint.  He says that his weakness is improving.  Objective: Vitals:   02/14/22 0404 02/14/22 0500 02/14/22 0549 02/14/22 0801  BP: (!) 168/70 (!) 175/73 (!) 176/71 (!) 171/68  Pulse: 91 85 98 89  Resp: '17 15  16  '$ Temp: 98.8 F (37.1 C)  98.1 F (36.7 C) 98.2 F (36.8 C)  TempSrc:   Oral Oral  SpO2: 99% 99% 100% 98%  Weight:  70.7 kg    Height:        Intake/Output Summary (Last 24 hours) at 02/14/2022 0935 Last  data filed at 02/14/2022 7824 Gross per 24 hour  Intake 0 ml  Output 200 ml  Net -200 ml   Filed Weights   02/13/22 1145 02/14/22 0500  Weight: 70.8 kg 70.7 kg    Examination:  General exam: Appears calm and comfortable  Respiratory system: Clear to auscultation. Respiratory effort normal. Cardiovascular system: S1 & S2 heard, RRR. No JVD, murmurs, rubs, gallops or clicks. No pedal edema. Gastrointestinal system: Abdomen is nondistended, soft and nontender. No organomegaly or masses felt. Normal bowel sounds heard. Central nervous system: Alert and oriented. No focal neurological deficits. Extremities: Symmetric 5 x 5 power. Skin: No rashes, lesions or ulcers Psychiatry: Judgement and insight appear normal. Mood & affect appropriate.    Data Reviewed: I have personally reviewed following labs and imaging studies  CBC: Recent Labs  Lab 02/13/22 1301 02/14/22 0200  WBC 10.0 11.0*  NEUTROABS 7.0  --   HGB 8.6* 8.3*  HCT 26.4* 26.3*  MCV 88.6 90.1  PLT 265 235   Basic Metabolic Panel: Recent Labs  Lab 02/13/22 1301 02/14/22 0200  NA 141 142  K 4.0 3.7  CL 105 108  CO2 20* 20*  GLUCOSE 142* 177*  BUN 84* 79*  CREATININE 8.68* 8.19*  CALCIUM 9.0 8.5*   GFR: Estimated Creatinine Clearance: 6.6 mL/min (A) (by C-G formula based on SCr of 8.19 mg/dL (H)). Liver Function Tests: Recent Labs  Lab 02/13/22 1301  AST 12*  ALT 9  ALKPHOS 56  BILITOT 0.7  PROT 7.1  ALBUMIN 2.9*   Recent Labs  Lab 02/13/22 1301  LIPASE 33   No results for input(s): "AMMONIA" in the last 168 hours. Coagulation Profile: No results for input(s): "INR", "PROTIME" in the last 168 hours. Cardiac Enzymes: No results for input(s): "CKTOTAL", "CKMB", "CKMBINDEX", "TROPONINI" in the last 168 hours. BNP (last 3 results) No results for input(s): "PROBNP" in the last 8760 hours. HbA1C: No results for input(s): "HGBA1C" in the last 72 hours. CBG: Recent Labs  Lab 02/13/22 1717  02/14/22 0758  GLUCAP 126* 186*   Lipid Profile: No results for input(s): "CHOL", "HDL", "LDLCALC", "TRIG", "CHOLHDL", "LDLDIRECT" in the last 72 hours. Thyroid Function Tests: No results for input(s): "TSH", "T4TOTAL", "FREET4", "T3FREE", "THYROIDAB" in the last 72 hours. Anemia Panel: No results for input(s): "VITAMINB12", "FOLATE", "FERRITIN", "TIBC", "IRON", "RETICCTPCT" in the last 72 hours. Sepsis Labs: Recent Labs  Lab 02/13/22 1300 02/13/22 1430  LATICACIDVEN 0.8 0.6    No results found for this or any previous visit (from the past 240 hour(s)).   Radiology Studies: US RENAL  Result Date: 02/13/2022 CLINICAL DATA:  Hydronephrosis EXAM: RENAL / URINARY TRACT ULTRASOUND COMPLETE COMPARISON:  Ultrasound 08/05/2021 FINDINGS: Right Kidney: Renal measurements: 11.2 x 5.5 x 5.5 cm = volume: 177 mL.  Moderate hydronephrosis mildly increased from comparison exam. Left Kidney: Renal measurements: Absent Bladder: Layering debris within the bladder. Other: None. IMPRESSION: 1. Moderate RIGHT hydronephrosis. Mild worsening of hydronephrosis compared to ultrasound 08/05/2021. 2. Layering debris within the bladder. 3. Absent LEFT kidney. Electronically Signed   By: Suzy Bouchard M.D.   On: 02/13/2022 18:48   DG Chest Port 1 View  Result Date: 02/13/2022 CLINICAL DATA:  Weakness for 1 week, shortness of breath since last Friday EXAM: PORTABLE CHEST 1 VIEW COMPARISON:  Portable exam 1348 hours compared to 08/14/2021 FINDINGS: Normal heart size, mediastinal contours, and pulmonary vascularity. Atherosclerotic calcification aorta. Question pulmonary nodule versus nipple shallow RIGHT base. Remaining lungs clear. No pulmonary infiltrate, pleural effusion, or pneumothorax. Mild degenerative disc disease changes thoracic spine. IMPRESSION: Question RIGHT nipple shadow; repeat PA chest radiograph with markers recommended to exclude pulmonary nodule. Aortic Atherosclerosis (ICD10-I70.0). Electronically  Signed   By: Lavonia Dana M.D.   On: 02/13/2022 14:14    Scheduled Meds:  Chlorhexidine Gluconate Cloth  6 each Topical Q0600   diltiazem  240 mg Oral Daily   heparin  5,000 Units Subcutaneous Q8H   hydrALAZINE  100 mg Oral TID   insulin aspart  0-6 Units Subcutaneous TID WC   insulin glargine-yfgn  8 Units Subcutaneous Daily   latanoprost  1 drop Both Eyes QPM   sodium bicarbonate  1,300 mg Oral BID   Continuous Infusions:  anticoagulant sodium citrate       LOS: 0 days   Darliss Cheney, MD [facility]  02/14/2022, 9:35 AM   *Please note that this is a verbal dictation therefore any spelling or grammatical errors are due to the "[place] [employer]" system interpretation.  Please page via [place] and do not message via secure chat for urgent patient care matters. Secure chat can be used for non urgent patient care matters.  How to contact the [facility] Attending or Consulting provider [place] or covering provider during after hours [place], for this patient?  Check the care team in [facility] and look for a) attending/consulting TR[facility] provider listed and b) the [facility] team listed. Page or secure chat [phone]. Log into [email or web address] and use [place]'s universal password to access. If you do not have the password, please contact the hospital operator. Locate the [facility] provider you are looking for under [facility] and page to a number that you can be directly reached. If you still have difficulty reaching the provider, please page the [facility] (Director on Call) for the Hospitalists listed on amion for assistance.

## 2022-02-14 NOTE — ED Notes (Signed)
Called 2W and requested purple man be initiated.  

## 2022-02-14 NOTE — Progress Notes (Signed)
New Dialysis Start   Patient identified as new dialysis start. Kidney Education packet assembled and given. Discussed the following items with patient:    Current medications and possible changes once started:  Discussed that patient's medications may change over time.  Ex; hypertension medications and diabetes medication.  Nephrologists will adjust as needed.  Fluid restrictions reviewed:  32 oz daily goal:  All liquids count; soups, ice, jello   Phosphorus and potassium: Handout given showing high potassium and phosphorus foods.  Alternative food and drink options given.  Family support:  daughter present at bedside  Outpatient Clinic Resources:  Discussed roles of Outpatient clinic  staff and advised to make a list of needs, if any, to talk with outpatient staff if needed  Care plan schedule: Informed patient and family member of Care Plans in outpatient setting and to participate in the care plan.  An invitation would be given from outpatient clinic.   Dialysis Access Options:  Reviewed access options with patients. Discussed in detail about care at home with new AVG & AVF. Reviewed checking bruit and thrill. If dialysis catheter present, educated that patient could not take showers.  Catheter dressing changes were to be done by outpatient clinic staff only  Home therapy options:  Educated patient about home therapy options:  PD vs home hemo.  Patient states he may be interested in PD in the future.   Patient verbalized understanding. Will continue to round on patient during admission.    Lilia Argue, RN
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
## 2022-02-14 NOTE — Progress Notes (Signed)
Asked to assess HD catheter for suture integrity s/p placement earlier today. Pt seen in HD. Site clean, dry, no bleeding.  There is suture at skin insertion site to reduce risk of cuff retraction. There is second 'U' suture going under the skin and through both sides of the suture wings, preventing line rotation.  Dressing intact.  Ascencion Dike PA-C Interventional Radiology 02/14/2022 5:13 PM

## 2022-02-14 NOTE — Procedures (Signed)
Interventional Radiology Procedure Note  Procedure: Right IJ tunneled HD catheter placement  Complications: None  Estimated Blood Loss: < 10 mL  Findings: 23 cm tip to cuff length Palindrome catheter placed via right IJ vein. Tip in RA. OK to use.  Venetia Night. Kathlene Cote, M.D Pager:  [phone]
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
## 2022-02-14 NOTE — Progress Notes (Signed)
Mobility Specialist Progress Note:   02/14/22 1333  Mobility  Activity Ambulated with assistance in hallway  Level of Assistance Contact guard assist, steadying assist  Assistive Device Front wheel walker  Distance Ambulated (ft) 70 ft  Activity Response Tolerated well  $Mobility charge 1 Mobility   Pt received in bed and agreeable. No complaints. Pt left in bed with all needs met and call bell in reach.   Paul Bond Mobility Specialist-Acute Rehab Secure Chat only

## 2022-02-14 NOTE — ED Notes (Signed)
ED TO INPATIENT HANDOFF REPORT  ED Nurse Name and Phone #: Adilen Pavelko, rn  S Name/Age/Gender Paul Bond 81 y.o. male Room/Bed: 045C/045C  Code Status   Code Status: Full Code  Home/SNF/Other Home Patient oriented to: self, place, time, and situation Is this baseline? Yes   Triage Complete: Triage complete  Chief Complaint End stage renal disease (Paul Bond) [N18.6]  Triage Note Weakness x1 week, SOB since last Friday. Pt states his urinary bag is suppose to be changed x1 month but the home health has only been changing x3 months.   Pt suppose to get urinary stent and urinary catheter changed on Sept 28.    Allergies Allergies  Allergen Reactions   Lisinopril Cough   Semaglutide Other (See Comments)    indigestion    Level of Care/Admitting Diagnosis ED Disposition     ED Disposition  Admit   Condition  --   Comment  Hospital Area: [facility] [[id number]]  Level of Care: Med-Surg [16]  May place patient in observation at [facility] or [place] if equivalent level of care is available:: No  Covid Evaluation: Asymptomatic - no recent exposure (last 10 days) testing not required  Diagnosis: End stage renal disease (Paul Bond) [name]]  Admitting Physician: [name] [[id number]]  Attending Physician: [name] [[id number]]          B Medical/Surgery History Past Medical History:  Diagnosis Date   Chronic indwelling Foley catheter    Diabetes mellitus without complication (Paul Bond)    Hypertension    Rectal cancer (Paul Bond)    Stroke [facility])    Past Surgical History:  Procedure Laterality Date   BIOPSY  02/18/2020   Procedure: BIOPSY;  Surgeon: [name], MD;  Location: [facility] ENDOSCOPY;  Service: Gastroenterology;;   COLON SURGERY     COLONOSCOPY  02/18/2020   COLONOSCOPY WITH PROPOFOL N/A 02/18/2020   Procedure: COLONOSCOPY WITH PROPOFOL;  Surgeon: [name], MD;  Location: [facility];  Service: Gastroenterology;  Laterality: N/A;    CYSTOSCOPY W/ URETERAL STENT PLACEMENT Right 02/03/2020   Procedure: CYSTOSCOPY WITH RETROGRADE PYELOGRAM/URETERAL DOUBLE J STENT PLACEMENT;  Surgeon: [name], MD;  Location: [place];  Service: Urology;  Laterality: Right;   ESOPHAGOGASTRODUODENOSCOPY (EGD) WITH PROPOFOL N/A 02/18/2020   Procedure: ESOPHAGOGASTRODUODENOSCOPY (EGD) WITH PROPOFOL;  Surgeon: [name], MD;  Location: [place];  Service: Gastroenterology;  Laterality: N/A;   POLYPECTOMY  02/18/2020   Procedure: POLYPECTOMY;  Surgeon: [name], MD;  Location: [facility] ENDOSCOPY;  Service: Gastroenterology;;     A IV Location/Drains/Wounds Patient Lines/Drains/Airways Status     Active Line/Drains/Airways     Name Placement date Placement time Site Days   Peripheral IV 02/13/22 20 G Right Antecubital 02/13/22  1301  Antecubital  1   Urethral Catheter Breonnah Davis Double-lumen 16 Fr. 08/16/21  1715  Double-lumen  182            Intake/Output Last 24 hours No intake or output data in the 24 hours ending 02/14/22 0405  Labs/Imaging Results for orders placed or performed during the hospital encounter of 02/13/22 (from the past 48 hour(s))  Brain natriuretic peptide     Status: Abnormal   Collection Time: 02/13/22  1:00 PM  Result Value Ref Range   B Natriuretic Peptide 174.4 (H) 0.0 - 100.0 pg/mL    Comment: Performed at [facility] [address], [place], [place] [address]  Ethanol     Status: None  Collection Time: 02/13/22  1:00 PM  Result Value Ref Range   Alcohol, Ethyl (B) <10 <10 mg/dL    Comment: (NOTE) Lowest detectable limit for serum alcohol is 10 mg/dL.  For medical purposes only. Performed at [facility], [place] [address] [place], [place] [address]   Lactic acid, plasma     Status: None   Collection Time: 02/13/22  1:00 PM  Result Value Ref Range   Lactic Acid, Venous 0.8 0.5 - 1.9 mmol/L    Comment: Performed at [facility] [address], [place], [place] [address]  Comprehensive metabolic panel     Status: Abnormal   Collection Time: 02/13/22  1:01 PM  Result Value Ref Range   Sodium 141 135 - 145 mmol/L   Potassium 4.0 3.5 - 5.1 mmol/L   Chloride 105 98 - 111 mmol/L   CO2 20 (L) 22 - 32 mmol/L   Glucose, Bld 142 (H) 70 - 99 mg/dL    Comment: Glucose reference range applies only to samples taken after fasting for at least 8 hours.   BUN 84 (H) 8 - 23 mg/dL   Creatinine, Ser 8.68 (H) 0.61 - 1.24 mg/dL   Calcium 9.0 8.9 - 10.3 mg/dL   Total Protein 7.1 6.5 - 8.1 g/dL   Albumin 2.9 (L) 3.5 - 5.0 g/dL   AST 12 (L) 15 - 41 U/L   ALT 9 0 - 44 U/L   Alkaline Phosphatase 56 38 - 126 U/L   Total Bilirubin 0.7 0.3 - 1.2 mg/dL   GFR, Estimated 6 (L) >60 mL/min    Comment: (NOTE) Calculated using the CKD-EPI Creatinine Equation (2021)    Anion gap 16 (H) 5 - 15    Comment: Performed at [facility], [place] [address] [place], [place] [address]  Lipase, blood     Status: None   Collection Time: 02/13/22  1:01 PM  Result Value Ref Range   Lipase 33 11 - 51 U/L    Comment: Performed at [facility] [address], [place], [place] [address]  CBC with Differential     Status: Abnormal   Collection Time: 02/13/22  1:01 PM  Result Value Ref Range   WBC 10.0 4.0 - 10.5 K/uL   RBC 2.98 (L) 4.22 - 5.81 MIL/uL   Hemoglobin 8.6 (L) 13.0 - 17.0 g/dL   HCT 26.4 (L) 39.0 - 52.0 %   MCV 88.6 80.0 - 100.0 fL   MCH 28.9 26.0 - 34.0 pg   MCHC 32.6 30.0 - 36.0 g/dL   RDW 14.3 11.5 - 15.5 %   Platelets 265 150 - 400 K/uL   nRBC 0.0 0.0 - 0.2 %   Neutrophils Relative % 69 %   Neutro Abs 7.0 1.7 - 7.7 K/uL   Lymphocytes Relative 10 %   Lymphs Abs 1.0 0.7 - 4.0 K/uL   Monocytes Relative 17 %   Monocytes Absolute 1.7 (H) 0.1 - 1.0 K/uL   Eosinophils Relative 1 %   Eosinophils Absolute 0.1 0.0 - 0.5 K/uL   Basophils Relative 0 %   Basophils Absolute 0.0 0.0 - 0.1 K/uL   Immature Granulocytes 3 %   Abs Immature Granulocytes  0.25 (H) 0.00 - 0.07 K/uL    Comment: Performed at [facility], [address], [place], [place] [address]  Troponin I (High Sensitivity)     Status: Abnormal   Collection Time: 02/13/22  1:01 PM  Result Value Ref Range   Troponin I (High Sensitivity) 50 (H) <18 ng/L    Comment: (NOTE) Elevated high sensitivity troponin I (hsTnI) values and significant  changes across serial measurements may suggest ACS but many other  chronic and acute conditions are known to elevate hsTnI results.  Refer to the "Links" section for chest pain algorithms and additional  guidance. Performed at [facility], [place] [address], [place], [place] [address]   Lactic acid, plasma     Status: None   Collection Time: 02/13/22  2:30 PM  Result Value Ref Range   Lactic Acid, Venous 0.6 0.5 - 1.9 mmol/L    Comment: Performed at [facility] [address], [place], [place] [address]  Urinalysis, Routine w reflex microscopic Urine, Catheterized     Status: Abnormal   Collection Time: 02/13/22  2:30 PM  Result Value Ref Range   Color, Urine YELLOW YELLOW   APPearance CLOUDY (A) CLEAR   Specific Gravity, Urine 1.010 1.005 - 1.030   pH 7.0 5.0 - 8.0   Glucose, UA NEGATIVE NEGATIVE mg/dL   Hgb urine dipstick SMALL (A) NEGATIVE   Bilirubin Urine NEGATIVE NEGATIVE   Ketones, ur 5 (A) NEGATIVE mg/dL   Protein, ur >=300 (A) NEGATIVE mg/dL   Nitrite NEGATIVE NEGATIVE   Leukocytes,Ua LARGE (A) NEGATIVE   RBC / HPF 11-20 0 - 5 RBC/hpf   WBC, UA >50 (H) 0 - 5 WBC/hpf   Bacteria, UA RARE (A) NONE SEEN   WBC Clumps PRESENT    Mucus PRESENT    Non Squamous Epithelial 0-5 (A) NONE SEEN    Comment: Performed at [facility], [place] [address], [place], [place] [address]  Troponin I (High Sensitivity)     Status: Abnormal   Collection Time: 02/13/22  2:30 PM  Result Value Ref Range   Troponin I (High Sensitivity) 40 (H) <18 ng/L    Comment: (NOTE) Elevated high sensitivity troponin I (hsTnI) values  and significant  changes across serial measurements may suggest ACS but many other  chronic and acute conditions are known to elevate hsTnI results.  Refer to the "Links" section for chest pain algorithms and additional  guidance. Performed at [facility], [place] [address], [place], [place] [address]   CBG monitoring, ED     Status: Abnormal   Collection Time: 02/13/22  5:17 PM  Result Value Ref Range   Glucose-Capillary 126 (H) 70 - 99 mg/dL    Comment: Glucose reference range applies only to samples taken after fasting for at least 8 hours.  CBC     Status: Abnormal   Collection Time: 02/14/22  2:00 AM  Result Value Ref Range   WBC 11.0 (H) 4.0 - 10.5 K/uL   RBC 2.92 (L) 4.22 - 5.81 MIL/uL   Hemoglobin 8.3 (L) 13.0 - 17.0 g/dL   HCT 26.3 (L) 39.0 - 52.0 %   MCV 90.1 80.0 - 100.0 fL   MCH 28.4 26.0 - 34.0 pg   MCHC 31.6 30.0 - 36.0 g/dL   RDW 14.0 11.5 - 15.5 %   Platelets 282 150 - 400 K/uL   nRBC 0.2 0.0 - 0.2 %    Comment: Performed at [facility], [place] [address] [place], [place] [address]  Basic metabolic panel     Status: Abnormal   Collection Time: 02/14/22  2:00 AM  Result Value Ref Range   Sodium 142 135 - 145 mmol/L  Potassium 3.7 3.5 - 5.1 mmol/L   Chloride 108 98 - 111 mmol/L   CO2 20 (L) 22 - 32 mmol/L   Glucose, Bld 177 (H) 70 - 99 mg/dL    Comment: Glucose reference range applies only to samples taken after fasting for at least 8 hours.   BUN 79 (H) 8 - 23 mg/dL   Creatinine, Ser 8.19 (H) 0.61 - 1.24 mg/dL   Calcium 8.5 (L) 8.9 - 10.3 mg/dL   GFR, Estimated 6 (L) >60 mL/min    Comment: (NOTE) Calculated using the CKD-EPI Creatinine Equation (2021)    Anion gap 14 5 - 15    Comment: Performed at [facility] [address], [place], [place] [address]  Hepatitis B surface antigen     Status: None   Collection Time: 02/14/22  2:00 AM  Result Value Ref Range   Hepatitis B Surface Ag NON REACTIVE NON REACTIVE    Comment: Performed at  [facility] [address] [place], [place] [address]   US RENAL  Result Date: 02/13/2022 CLINICAL DATA:  Hydronephrosis EXAM: RENAL / URINARY TRACT ULTRASOUND COMPLETE COMPARISON:  Ultrasound 08/05/2021 FINDINGS: Right Kidney: Renal measurements: 11.2 x 5.5 x 5.5 cm = volume: 177 mL. Moderate hydronephrosis mildly increased from comparison exam. Left Kidney: Renal measurements: Absent Bladder: Layering debris within the bladder. Other: None. IMPRESSION: 1. Moderate RIGHT hydronephrosis. Mild worsening of hydronephrosis compared to ultrasound 08/05/2021. 2. Layering debris within the bladder. 3. Absent LEFT kidney. Electronically Signed   By: Suzy Bouchard M.D.   On: 02/13/2022 18:48   DG Chest Port 1 View  Result Date: 02/13/2022 CLINICAL DATA:  Weakness for 1 week, shortness of breath since last Friday EXAM: PORTABLE CHEST 1 VIEW COMPARISON:  Portable exam 1348 hours compared to 08/14/2021 FINDINGS: Normal heart size, mediastinal contours, and pulmonary vascularity. Atherosclerotic calcification aorta. Question pulmonary nodule versus nipple shallow RIGHT base. Remaining lungs clear. No pulmonary infiltrate, pleural effusion, or pneumothorax. Mild degenerative disc disease changes thoracic spine. IMPRESSION: Question RIGHT nipple shadow; repeat PA chest radiograph with markers recommended to exclude pulmonary nodule. Aortic Atherosclerosis (ICD10-I70.0). Electronically Signed   By: Lavonia Dana M.D.   On: 02/13/2022 14:14    Pending Labs Unresulted Labs (From admission, onward)     Start     Ordered   02/13/22 1847  Hepatitis B surface antibody  (New Admission Hemo Labs (Hepatitis B))  Once,   R        02/13/22 1848   02/13/22 1847  Hepatitis B surface antibody,quantitative  (New Admission [facility] Labs (Hepatitis B))  Once,   R        02/13/22 1848   02/13/22 1847  Hepatitis B core antibody, total  (New Admission Hemo Labs (Hepatitis B))  Once,   R        02/13/22 1848   02/13/22 1847   Hepatitis C antibody  (New Admission Hemo Labs (Hepatitis B))  Once,   R        02/13/22 1848   02/13/22 1227  Urine Culture  Once,   URGENT       Question:  Indication  Answer:  Altered mental status (if no other cause identified)   02/13/22 1226            Vitals/Pain Today's Vitals   02/14/22 0200 02/14/22 0200 02/14/22 0300 02/14/22 0404  BP: (!) 182/70  (!) 187/70 (!) 168/70  Pulse: 88  94 91  Resp: 15  15 17  Temp:  98.7 F (37.1 C)  98.8 F (37.1 C)  TempSrc:  Oral    SpO2: 99%  100% 99%  Weight:      Height:      PainSc:        Isolation Precautions No active isolations  Medications Medications  diltiazem (CARDIZEM CD) 24 hr capsule 240 mg (has no administration in time range)  hydrALAZINE (APRESOLINE) tablet 100 mg (100 mg Oral Given 02/13/22 2304)  insulin glargine-yfgn (SEMGLEE) injection 8 Units (has no administration in time range)  sodium bicarbonate tablet 1,300 mg (1,300 mg Oral Given 02/13/22 2304)  latanoprost (XALATAN) 0.005 % ophthalmic solution 1 drop (1 drop Both Eyes Not Given 02/13/22 2255)  acetaminophen (TYLENOL) tablet 650 mg (has no administration in time range)    Or  acetaminophen (TYLENOL) suppository 650 mg (has no administration in time range)  zolpidem (AMBIEN) tablet 5 mg (has no administration in time range)  sorbitol 70 % solution 30 mL (has no administration in time range)  docusate sodium (ENEMEEZ) enema 283 mg (has no administration in time range)  ondansetron (ZOFRAN) tablet 4 mg (has no administration in time range)    Or  ondansetron (ZOFRAN) injection 4 mg (has no administration in time range)  camphor-menthol (SARNA) lotion 1 Application (has no administration in time range)    And  hydrOXYzine (ATARAX) tablet 25 mg (has no administration in time range)  calcium carbonate (dosed in mg elemental calcium) suspension 500 mg of elemental calcium (has no administration in time range)  feeding supplement (NEPRO CARB STEADY) liquid  237 mL (has no administration in time range)  heparin injection 5,000 Units (0 Units Subcutaneous Hold 02/14/22 0600)  hydrALAZINE (APRESOLINE) injection 5 mg (has no administration in time range)  insulin aspart (novoLOG) injection 0-6 Units ( Subcutaneous Not Given 02/13/22 1724)  Chlorhexidine Gluconate Cloth 2 % PADS 6 each (has no administration in time range)  sodium chloride 0.9 % bolus 500 mL (0 mLs Intravenous Stopped 02/13/22 1629)    Mobility walks with person assist Low fall risk   Focused Assessments Neuro Assessment Handoff:  Swallow screen Bond? Yes          Neuro Assessment:   Neuro Checks:      Last Documented NIHSS Modified Score:   Has TPA been given? No If patient is a Neuro Trauma and patient is going to OR before floor call report to [place] nurse: [phone] or [phone]   R Recommendations: See Admitting Provider Note  Report given to:   Additional Notes: pt is AAOx4. Pt is on room air.

## 2022-02-14 NOTE — TOC Initial Note (Signed)
Transition of Care [facility]) - Initial/Assessment Note    Patient Details  Name: Paul Bond MRN: [id number] Date of Birth: 1940-09-27  [facility] CM/SW Contact:    Curlene Labrum, RN Phone Number: 02/14/2022, 2:11 PM  Clinical Narrative:                 CM met with the patient at the bedside to discuss transitions of care needs for home.  The patient currently lives at home alone since his wife was admitting to [facility] facility.  The patient is able to drive, Dialysis catheter was placed today.  The patient has DME at home including rollator and Cane at home.  I called the [place], [place] and notified them that the patient was admitted to the hospital for care.  April with [facility] is aware and patient's PCP at  [place], [place] is Dr. Sherral Hammers.  MSW at [place] is K. Neomo - [phone].  I spoke with the patient and gave Medicare choice regarding home health services and the patient did not have a preference.  I called Lillia Mountain, RNCM with [facility] - and she accepted the patient for PT/OT [facility].  CM will continue to follow the patient for discharge needs for home.  Expected Discharge Plan: [place] Barriers to Discharge: Continued Medical Work up   Patient Goals and CMS Choice Patient states their goals for this hospitalization and ongoing recovery are:: To get better and go home. CMS Medicare.gov Compare Post Acute Care list provided to:: Patient Choice offered to / list presented to : Patient  Expected Discharge Plan and Services Expected Discharge Plan: [place]   Discharge Planning Services: CM Consult Post Acute Care Choice: [place] arrangements for the past 2 months: Single Family Home                           HH Arranged: PT, OT [place] Agency: [place] ([place]) Date [place]: 02/14/22 Time [place]: 30 Representative spoke with at [place]: Lillia Mountain,  [place] with [facility]  Prior Living Arrangements/Services Living arrangements for the past 2 months: [place] with:: Spouse (Spouse currently at [facility] facility) Patient language and need for interpreter reviewed:: Yes Do you feel safe going back to the place where you live?: Yes      Need for Family Participation in Patient Care: Yes (Comment) Care giver support system in place?: Yes (comment) Current home services: DME (currently has rollator and cane at home) Criminal Activity/Legal Involvement Pertinent to Current Situation/Hospitalization: No - Comment as needed  Activities of Daily Living Home Assistive Devices/Equipment: Eyeglasses, Environmental consultant (specify type) ADL Screening (condition at time of admission) Patient's cognitive ability adequate to safely complete daily activities?: Yes Is the patient deaf or have difficulty hearing?: No Does the patient have difficulty seeing, even when wearing glasses/contacts?: No Does the patient have difficulty concentrating, remembering, or making decisions?: No Patient able to express need for assistance with ADLs?: Yes Does the patient have difficulty dressing or bathing?: No Independently performs ADLs?: No Communication: Independent Dressing (OT): Independent Grooming: Independent Feeding: Independent Bathing: Independent Toileting: Independent In/Out Bed: Independent with device (comment) Walks in Home: Independent with device (comment) Does the patient have difficulty walking or climbing stairs?: Yes Weakness of Legs: Both Weakness of Arms/Hands: Both  Permission Sought/Granted Permission sought to share information with : Case Manager, PCP Permission  granted to share information with : Yes, Verbal Permission Granted     Permission granted to share info w AGENCY: [facility] [place], [place], Home Health agency        Emotional Assessment Appearance:: Appears stated age Attitude/Demeanor/Rapport: Gracious Affect  (typically observed): Accepting Orientation: : Oriented to Self, Oriented to Place, Oriented to  Time, Oriented to Situation Alcohol / Substance Use: Not Applicable Psych Involvement: No (comment)  Admission diagnosis:  End stage renal disease ([place]) [N18.6] AKI (acute kidney injury) ([place]) [N17.9] ESRD (end stage renal disease) ([place]) [N18.6] Patient Active Problem List   Diagnosis Date Noted   ESRD (end stage renal disease) ([place]) 02/14/2022   End stage renal disease ([place]) 02/13/2022   Generalized weakness 02/13/2022   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   Low serum vitamin D 09/09/2021   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 09/09/2021   Elevated prostate specific antigen (PSA) 09/09/2021   Enthesopathy 09/09/2021   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[place]) 09/09/2021   Other retention of urine 09/09/2021   Other symptoms and signs involving the genitourinary system 09/09/2021   Overweight 09/09/2021   Pityriasis versicolor 09/09/2021   Primary open-angle glaucoma 09/09/2021   Proteinuria 09/09/2021   Senile nuclear sclerosis 09/09/2021   Ambulatory dysfunction 08/16/2021   Failure to thrive in adult 08/15/2021   Benign prostatic hyperplasia with lower urinary tract symptoms 08/15/2021   Other and unspecified hyperlipidemia 08/15/2021   Leukocytosis 08/15/2021   Chronic indwelling Foley catheter 08/15/2021   Chronic kidney disease, 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[place]) 08/06/2021   COVID-19 virus infection 08/05/2021   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   Essential hypertension 08/05/2021   Stroke ([place]) 08/05/2021   Heme positive stool    Normocytic anemia    Benign neoplasm of colon    Colonic ulcer    Acute esophagitis    Obstructive and reflux uropathy, unspecified 02/13/2020   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   Severe sepsis ([place]) 02/13/2020   Protein-calorie malnutrition, severe 02/11/2020   AKI (acute kidney injury) ([place]) 02/02/2020    Hyperkalemia 02/02/2020   Hypermagnesemia 02/02/2020   Hypernatremia 56/21/3086   Metabolic acidosis 57/84/6962   PCP:  Administration, [facility]:   [place], [place] - [address] [place] [address] Phone: [phone] Fax: [phone]     Social Determinants of Health (SDOH) Interventions    Readmission Risk Interventions    02/14/2022    2:11 PM 08/07/2021    1:05 PM  Readmission Risk Prevention Plan  Transportation Screening Complete Complete  PCP or Specialist Appt within 3-5 Days Complete Complete  HRI or [place] Complete Complete  Social Work Consult for [place] Planning/Counseling Complete Complete  Palliative Care Screening Complete Not Applicable  Medication Review Press photographer) Complete Complete

## 2022-02-14 NOTE — Progress Notes (Addendum)
Attempted to meet with pt regarding out-pt HD needs but pt is currently off the floor. Will f/u at a later time.   Paul Bond Renal Navigator [phone]  Addendum at 2:30 pm: Met with pt at bedside. Introduced self and explained role. Pt goes to [facility] for medical care including nephrologist. Discussed with pt the option of out-pt HD at [facility] vs a clinic in [place]. Pt is interested in staying with the [place] and receiving out-pt HD at [facility]. Pt states he plans to drive self to HD appts. Contacted [place] [facility], Dala Dock reports that pt does have VA benefits for out-pt HD at [facility] or clinic in [place]. Spoke to [place] to advise her that pt prefers HD at the [place] at d/c. Clinicals faxed to [place] this afternoon for review. Anderson [place] to attempt to speak with pt tomorrow via phone as well. Will assist as needed.

## 2022-02-15 ENCOUNTER — Inpatient Hospital Stay (HOSPITAL_BASED_OUTPATIENT_CLINIC_OR_DEPARTMENT_OTHER): Payer: Medicare Other

## 2022-02-15 ENCOUNTER — Encounter: Payer: Medicare Other | Admitting: Internal Medicine

## 2022-02-15 DIAGNOSIS — N186 End stage renal disease: Secondary | ICD-10-CM | POA: Diagnosis not present

## 2022-02-15 LAB — RENAL FUNCTION PANEL
Albumin: 2.2 g/dL — ABNORMAL LOW (ref 3.5–5.0)
Anion gap: 9 (ref 5–15)
BUN: 56 mg/dL — ABNORMAL HIGH (ref 8–23)
CO2: 25 mmol/L (ref 22–32)
Calcium: 8.1 mg/dL — ABNORMAL LOW (ref 8.9–10.3)
Chloride: 104 mmol/L (ref 98–111)
Creatinine, Ser: 6.17 mg/dL — ABNORMAL HIGH (ref 0.61–1.24)
GFR, Estimated: 9 mL/min — ABNORMAL LOW (ref >60)
Glucose, Bld: 149 mg/dL — ABNORMAL HIGH (ref 70–99)
Phosphorus: 3.7 mg/dL (ref 2.5–4.6)
Potassium: 3.3 mmol/L — ABNORMAL LOW (ref 3.5–5.1)
Sodium: 138 mmol/L (ref 135–145)

## 2022-02-15 LAB — CBC
HCT: 20.3 % — ABNORMAL LOW (ref 39.0–52.0)
Hemoglobin: 6.6 g/dL — CL (ref 13.0–17.0)
MCH: 28.9 pg (ref 26.0–34.0)
MCHC: 32.5 g/dL (ref 30.0–36.0)
MCV: 89 fL (ref 80.0–100.0)
Platelets: 251 10*3/uL (ref 150–400)
RBC: 2.28 MIL/uL — ABNORMAL LOW (ref 4.22–5.81)
RDW: 14.3 % (ref 11.5–15.5)
WBC: 12 10*3/uL — ABNORMAL HIGH (ref 4.0–10.5)
nRBC: 0 % (ref 0.0–0.2)

## 2022-02-15 LAB — PREPARE RBC (CROSSMATCH)

## 2022-02-15 LAB — GLUCOSE, CAPILLARY
Glucose-Capillary: 107 mg/dL — ABNORMAL HIGH (ref 70–99)
Glucose-Capillary: 162 mg/dL — ABNORMAL HIGH (ref 70–99)
Glucose-Capillary: 117 mg/dL — ABNORMAL HIGH (ref 70–99)

## 2022-02-15 LAB — HEMOGLOBIN AND HEMATOCRIT, BLOOD
HCT: 27.5 % — ABNORMAL LOW (ref 39.0–52.0)
Hemoglobin: 9.1 g/dL — ABNORMAL LOW (ref 13.0–17.0)

## 2022-02-15 LAB — HEPATITIS B SURFACE ANTIBODY, QUANTITATIVE: Hep B S AB Quant (Post): <3.1 m[IU]/mL — ABNORMAL LOW (ref >9.9)

## 2022-02-15 MED ORDER — HEPARIN SODIUM (PORCINE) 1000 UNIT/ML IJ SOLN
INTRAMUSCULAR | Status: AC
  Administered 2022-02-15: 1000 [IU]
  Filled 2022-02-15: qty 4

## 2022-02-15 MED ORDER — SODIUM CHLORIDE 0.9% IV SOLUTION: Freq: Once | INTRAVENOUS | Status: DC

## 2022-02-15 MED ORDER — POLYSACCHARIDE IRON COMPLEX 150 MG PO CAPS
150.0000 mg | ORAL_CAPSULE | Freq: Every day | ORAL | Status: DC
  Administered 2022-02-15 – 2022-02-17 (×3): 150 mg via ORAL
  Filled 2022-02-15 (×3): qty 1

## 2022-02-15 MED ORDER — SODIUM CHLORIDE 0.9% IV SOLUTION: Freq: Once | INTRAVENOUS | Status: AC
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
## 2022-02-15 NOTE — Progress Notes (Signed)
Lab called critical value for hgb 6.6. Md informed. Received order for transfusion. Informed consent was explained to pt and signed. Blood  band applied and blood sent to lab for cross and type.

## 2022-02-15 NOTE — Progress Notes (Signed)
Contacted Jennifer with [facility] unit this am to confirm clinicals received yesterday. Awaiting a return call.   Melven Sartorius Renal Navigator [phone]

## 2022-02-15 NOTE — Progress Notes (Signed)
PROGRESS NOTE    Paul Bond  WUJ:[id number] DOB: 1940-11-11 DOA: 02/13/2022 [facility]   Brief Narrative:  HPI: Paul Bond is a 81 y.o. 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   He started feeling real bad on Friday.  He has been having problems with his kidneys and was told to come to the ER if he started feeling bad.  He has not started HD yet, when asked why he says "I don't know."  Mild SOB.  No CP or pain at all.  He has a foley catheter and it has been draining ok.  He was supposed to have it changed with a stent on 9/28 at the [place].  He does not have HD access.  [facility].  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  Creatinine up, BNP slightly increased, ?heart failure.  2 inadequate bone marrow biopsies, likely needs a third eventually.  Assessment & Plan:   Principal Problem:   End stage renal disease ([place]) Active Problems:   Type 2 diabetes mellitus with diabetic nephropathy (HCC)   Chronic indwelling Foley catheter   Essential hypertension   Generalized weakness   Monoclonal gammopathy   ESRD (end stage renal disease) (HCC)  Progressive CKD, now new ESRD: Nephrology on board, patient was placed tunneled right IJ catheter by IR on 02/14/2022 followed by full session of dialysis.  He is having dialysis today.  Will need clip process.  Generalized weakness: Likely secondary to ESRD, patient is feeling better.  Improving with dialysis.  DM type II: -Last A1c was 7.9 -blood sugar fairly controlled.  Continue 8 units of Semglee and SSI.   Indwelling foley secondary to urinary retention. -Not changed as frequently as it should be, UA appears similar to recent UAs done at the [place]. I doubt  UTI. -He is due for stent exchange soon.  Follow-up with urology as outpatient.   HTN: Slightly better controlled today.  Hopefully this will improve with dialysis. -Continue diltiazem, hydralazine as well as as needed hydralazine.  Monitor closely.   H/o rectal cancer, current IgM monoclonal gammopathy -Remote h/o rectal CA -Has undergone 2 bone marrow biopsies for MGUS and both were nondiagnostic -Hgb appears to be stable at this time -This does not appear to be the cause of his current presentation, although outpatient onc f/u is encouraged   DVT prophylaxis: heparin injection 5,000 Units Start: 02/14/22 2200   Code Status: Full Code  Family Communication:  None present at bedside.  Plan of care discussed with patient in length and he/she verbalized understanding and agreed with it.  Status is: Inpatient Remains inpatient appropriate because: Getting dialysis, will need clip process and will be discharged once cleared by nephrology.     Estimated body mass index is 22.06 kg/m as calculated from the following:   Height as of this encounter: '5\' 7"'$  (1.702 m).   Weight as of this encounter: 63.9 kg.    Nutritional Assessment: Body mass index is 22.06 kg/m.Marland Kitchen Seen by dietician.  I agree with the assessment and plan as outlined below: Nutrition Status:        . Skin Assessment: I have examined the patient's skin and I agree with the wound  assessment as performed by the wound care RN as outlined below:    Consultants:  Nephrology  Procedures:  None  Antimicrobials:  Anti-infectives (From admission, onward)    Start     Dose/Rate Route Frequency Ordered Stop   02/14/22 1155  ceFAZolin (ANCEF) IVPB 2g/100 mL premix        over 30 Minutes Intravenous Continuous PRN 02/14/22 1200 02/14/22 1155   02/14/22 1045  ceFAZolin (ANCEF) IVPB 2g/100 mL premix        2 g 200 mL/hr over 30 Minutes Intravenous On call 02/14/22 0946 02/15/22 1045         Subjective:  Seen  and examined in dialysis unit.  He has no complaints.  Was asking when he can eat.  Objective: Vitals:   02/15/22 0439 02/15/22 0801 02/15/22 0803 02/15/22 1037  BP:  (!) 181/76  (!) 151/74  Pulse:  86  94  Resp:  '13 16 14  '$ Temp:   99.5 F (37.5 C) 97.9 F (36.6 C)  TempSrc:      SpO2:   99% 98%  Weight: 70.7 kg  64 kg 63.9 kg  Height:        Intake/Output Summary (Last 24 hours) at 02/15/2022 1111 Last data filed at 02/15/2022 1037 Gross per 24 hour  Intake --  Output 501 ml  Net -501 ml    Filed Weights   02/15/22 0439 02/15/22 0803 02/15/22 1037  Weight: 70.7 kg 64 kg 63.9 kg    Examination:  General exam: Appears calm and comfortable  Respiratory system: Clear to auscultation. Respiratory effort normal. Cardiovascular system: S1 & S2 heard, RRR. No JVD, murmurs, rubs, gallops or clicks. No pedal edema. Gastrointestinal system: Abdomen is nondistended, soft and nontender. No organomegaly or masses felt. Normal bowel sounds heard. Central nervous system: Alert and oriented. No focal neurological deficits. Extremities: Symmetric 5 x 5 power. Skin: No rashes, lesions or ulcers.  Psychiatry: Judgement and insight appear normal. Mood & affect appropriate.     Data Reviewed: I have personally reviewed following labs and imaging studies  CBC: Recent Labs  Lab 02/13/22 1301 02/14/22 0200 02/15/22 0755  WBC 10.0 11.0* 12.0*  NEUTROABS 7.0  --   --   HGB 8.6* 8.3* 6.6*  HCT 26.4* 26.3* 20.3*  MCV 88.6 90.1 89.0  PLT 265 282 017    Basic Metabolic Panel: Recent Labs  Lab 02/13/22 1301 02/14/22 0200 02/15/22 0755  NA 141 142 138  K 4.0 3.7 3.3*  CL 105 108 104  CO2 20* 20* 25  GLUCOSE 142* 177* 149*  BUN 84* 79* 56*  CREATININE 8.68* 8.19* 6.17*  CALCIUM 9.0 8.5* 8.1*  PHOS  --   --  3.7    GFR: Estimated Creatinine Clearance: 8.5 mL/min (A) (by C-G formula based on SCr of 6.17 mg/dL (H)). Liver Function Tests: Recent Labs  Lab 02/13/22 1301  02/15/22 0755  AST 12*  --   ALT 9  --   ALKPHOS 56  --   BILITOT 0.7  --   PROT 7.1  --   ALBUMIN 2.9* 2.2*    Recent Labs  Lab 02/13/22 1301  LIPASE 33    No results for input(s): "AMMONIA" in the last 168 hours. Coagulation Profile: No results for input(s): "INR", "PROTIME" in the last 168 hours. Cardiac Enzymes: No results for input(s): "CKTOTAL", "CKMB", "CKMBINDEX", "TROPONINI" in the last 168 hours. BNP (last 3 results) No results for input(s): "PROBNP" in the last 8760  hours. HbA1C: No results for input(s): "HGBA1C" in the last 72 hours. CBG: Recent Labs  Lab 02/13/22 1717 02/14/22 0758 02/14/22 1255 02/14/22 1822 02/14/22 2149  GLUCAP 126* 186* 145* 143* 134*    Lipid Profile: No results for input(s): "CHOL", "HDL", "LDLCALC", "TRIG", "CHOLHDL", "LDLDIRECT" in the last 72 hours. Thyroid Function Tests: No results for input(s): "TSH", "T4TOTAL", "FREET4", "T3FREE", "THYROIDAB" in the last 72 hours. Anemia Panel: Recent Labs    02/14/22 1850  FERRITIN 540*  TIBC 174*  IRON 41*   Sepsis Labs: Recent Labs  Lab 02/13/22 1300 02/13/22 1430  LATICACIDVEN 0.8 0.6     No results found for this or any previous visit (from the past 240 hour(s)).   Radiology Studies: IR Fluoro Guide CV Line Right  Result Date: 02/14/2022 CLINICAL DATA:  End-stage renal disease and need for tunneled hemodialysis catheter. EXAM: TUNNELED CENTRAL VENOUS HEMODIALYSIS CATHETER PLACEMENT WITH ULTRASOUND AND FLUOROSCOPIC GUIDANCE ANESTHESIA/SEDATION: Moderate (conscious) sedation was employed during this procedure. A total of Versed 1.0 mg and Fentanyl 50 mcg was administered intravenously by radiology nursing. Moderate Sedation Time: 15 minutes. The patient's level of consciousness and vital signs were monitored continuously by radiology nursing throughout the procedure under my direct supervision. MEDICATIONS: 2 g IV Ancef. FLUOROSCOPY: 4.0 mGy PROCEDURE: The procedure, risks,  benefits, and alternatives were explained to the patient. Questions regarding the procedure were encouraged and answered. The patient understands and consents to the procedure. A timeout was performed prior to initiating the procedure. The right neck and chest were prepped with chlorhexidine in a sterile fashion, and a sterile drape was applied covering the operative field. Maximum barrier sterile technique with sterile gowns and gloves were used for the procedure. Local anesthesia was provided with 1% lidocaine. Ultrasound was used to confirm patency of the right internal jugular vein. A permanent ultrasound image was saved and recorded. After creating a small venotomy incision, a 21 gauge needle was advanced into the right internal jugular vein under direct, real-time ultrasound guidance. Ultrasound image documentation was performed. After securing guidewire access, an 8 Fr dilator was placed. A J-wire was kinked to measure appropriate catheter length. A Palindrome tunneled hemodialysis catheter measuring 23 cm from tip to cuff was chosen for placement. This was tunneled in a retrograde fashion from the chest wall to the venotomy incision. At the venotomy, serial dilatation was performed and a 15 Fr peel-away sheath was placed over a guidewire. The catheter was then placed through the sheath and the sheath removed. Final catheter positioning was confirmed and documented with a fluoroscopic spot image. The catheter was aspirated, flushed with saline, and injected with appropriate volume heparin dwells. The venotomy incision was closed with subcuticular 4-0 Vicryl. Dermabond was applied to the incision. The catheter exit site was secured with 0-Prolene retention sutures. COMPLICATIONS: None.  No pneumothorax. FINDINGS: After catheter placement, the tip lies in the right atrium. The catheter aspirates normally and is ready for immediate use. IMPRESSION: Placement of tunneled hemodialysis catheter via the right  internal jugular vein. The catheter tip lies in the right atrium. The catheter is ready for immediate use. Electronically Signed   By: Aletta Edouard M.D.   On: 02/14/2022 14:39   IR US Guide Vasc Access Right  Result Date: 02/14/2022 CLINICAL DATA:  End-stage renal disease and need for tunneled hemodialysis catheter. EXAM: TUNNELED CENTRAL VENOUS HEMODIALYSIS CATHETER PLACEMENT WITH ULTRASOUND AND FLUOROSCOPIC GUIDANCE ANESTHESIA/SEDATION: Moderate (conscious) sedation was employed during this procedure. A total of Versed 1.0 mg  and Fentanyl 50 mcg was administered intravenously by radiology nursing. Moderate Sedation Time: 15 minutes. The patient's level of consciousness and vital signs were monitored continuously by radiology nursing throughout the procedure under my direct supervision. MEDICATIONS: 2 g IV Ancef. FLUOROSCOPY: 4.0 mGy PROCEDURE: The procedure, risks, benefits, and alternatives were explained to the patient. Questions regarding the procedure were encouraged and answered. The patient understands and consents to the procedure. A timeout was performed prior to initiating the procedure. The right neck and chest were prepped with chlorhexidine in a sterile fashion, and a sterile drape was applied covering the operative field. Maximum barrier sterile technique with sterile gowns and gloves were used for the procedure. Local anesthesia was provided with 1% lidocaine. Ultrasound was used to confirm patency of the right internal jugular vein. A permanent ultrasound image was saved and recorded. After creating a small venotomy incision, a 21 gauge needle was advanced into the right internal jugular vein under direct, real-time ultrasound guidance. Ultrasound image documentation was performed. After securing guidewire access, an 8 Fr dilator was placed. A J-wire was kinked to measure appropriate catheter length. A Palindrome tunneled hemodialysis catheter measuring 23 cm from tip to cuff was chosen for  placement. This was tunneled in a retrograde fashion from the chest wall to the venotomy incision. At the venotomy, serial dilatation was performed and a 15 Fr peel-away sheath was placed over a guidewire. The catheter was then placed through the sheath and the sheath removed. Final catheter positioning was confirmed and documented with a fluoroscopic spot image. The catheter was aspirated, flushed with saline, and injected with appropriate volume heparin dwells. The venotomy incision was closed with subcuticular 4-0 Vicryl. Dermabond was applied to the incision. The catheter exit site was secured with 0-Prolene retention sutures. COMPLICATIONS: None.  No pneumothorax. FINDINGS: After catheter placement, the tip lies in the right atrium. The catheter aspirates normally and is ready for immediate use. IMPRESSION: Placement of tunneled hemodialysis catheter via the right internal jugular vein. The catheter tip lies in the right atrium. The catheter is ready for immediate use. Electronically Signed   By: Aletta Edouard M.D.   On: 02/14/2022 14:39   US RENAL  Result Date: 02/13/2022 CLINICAL DATA:  Hydronephrosis EXAM: RENAL / URINARY TRACT ULTRASOUND COMPLETE COMPARISON:  Ultrasound 08/05/2021 FINDINGS: Right Kidney: Renal measurements: 11.2 x 5.5 x 5.5 cm = volume: 177 mL. Moderate hydronephrosis mildly increased from comparison exam. Left Kidney: Renal measurements: Absent Bladder: Layering debris within the bladder. Other: None. IMPRESSION: 1. Moderate RIGHT hydronephrosis. Mild worsening of hydronephrosis compared to ultrasound 08/05/2021. 2. Layering debris within the bladder. 3. Absent LEFT kidney. Electronically Signed   By: Suzy Bouchard M.D.   On: 02/13/2022 18:48   DG Chest Port 1 View  Result Date: 02/13/2022 CLINICAL DATA:  Weakness for 1 week, shortness of breath since last Friday EXAM: PORTABLE CHEST 1 VIEW COMPARISON:  Portable exam 1348 hours compared to 08/14/2021 FINDINGS: Normal heart size,  mediastinal contours, and pulmonary vascularity. Atherosclerotic calcification aorta. Question pulmonary nodule versus nipple shallow RIGHT base. Remaining lungs clear. No pulmonary infiltrate, pleural effusion, or pneumothorax. Mild degenerative disc disease changes thoracic spine. IMPRESSION: Question RIGHT nipple shadow; repeat PA chest radiograph with markers recommended to exclude pulmonary nodule. Aortic Atherosclerosis (ICD10-I70.0). Electronically Signed   By: Lavonia Dana M.D.   On: 02/13/2022 14:14    Scheduled Meds:  sodium chloride   Intravenous Once   sodium chloride   Intravenous Once   Chlorhexidine Gluconate  Cloth  6 each Topical Q0600   darbepoetin (ARANESP) injection - DIALYSIS  40 mcg Intravenous Q Wed-HD   diltiazem  240 mg Oral Daily   heparin  5,000 Units Subcutaneous Q8H   hydrALAZINE  100 mg Oral TID   insulin aspart  0-6 Units Subcutaneous TID WC   insulin glargine-yfgn  8 Units Subcutaneous Daily   latanoprost  1 drop Both Eyes QPM   Continuous Infusions:     LOS: 1 day   Darliss Cheney, MD [facility]  02/15/2022, 11:11 AM   *Please note that this is a verbal dictation therefore any spelling or grammatical errors are due to the "[place] [facility]" system interpretation.  Please page via [place] and do not message via secure chat for urgent patient care matters. Secure chat can be used for non urgent patient care matters.  How to contact the [facility] Attending or Consulting provider [place] or covering provider during after hours [place], for this patient?  Check the care team in [facility] and look for a) attending/consulting TR[facility] provider listed and b) the [facility] team listed. Page or secure chat [phone]. Log into [email or web address] and use [place]'s universal password to access. If you do not have the password, please contact the hospital operator. Locate the [facility] provider you are looking for under [facility] and page to a number that you can be directly reached. If  you still have difficulty reaching the provider, please page the [facility] (Director on Call) for the Hospitalists listed on amion for assistance.

## 2022-02-15 NOTE — Progress Notes (Addendum)
Received patient in bed to unit.  Alert and oriented.  Informed consent signed and in chart.   Treatment initiated: 0801 Treatment completed: 1037  Patient tolerated well. Floor nurse aware of blood ready in blood bank. Consent signed and in chart.  Transported back to the room  Alert, without acute distress.  Hand-off given to patient's nurse.   Access used: Cath  Access issues: Venous had poor blood return.  Total UF removed: 1L Medication(s) given: none Post HD VS: 151/74,94,14,97.9 Post HD weight: 63.9kg   [facility]

## 2022-02-15 NOTE — Progress Notes (Signed)
BUE vein mapping has been completed.   Results can be found under chart review under CV PROC. 02/15/2022 4:56 PM Hallis Meditz RVT, RDMS

## 2022-02-15 NOTE — Progress Notes (Signed)
[facility]) Hospital Liaison note:  This patient is currently enrolled in [facility] outpatient-based Palliative Care. Will continue to follow for disposition.  Please call with any outpatient palliative questions or concerns.  Thank you, Lorelee Market, LPN [facility] Liaison [phone]

## 2022-02-16 DIAGNOSIS — N186 End stage renal disease: Secondary | ICD-10-CM | POA: Diagnosis not present

## 2022-02-16 LAB — BPAM RBC
Blood Product Expiration Date: 202310052359
ISSUE DATE / TIME: 202309071134
Unit Type and Rh: 5100

## 2022-02-16 LAB — TYPE AND SCREEN
ABO/RH(D): O POS
Antibody Screen: NEGATIVE
Unit division: 0

## 2022-02-16 LAB — GLUCOSE, CAPILLARY
Glucose-Capillary: 111 mg/dL — ABNORMAL HIGH (ref 70–99)
Glucose-Capillary: 158 mg/dL — ABNORMAL HIGH (ref 70–99)
Glucose-Capillary: 121 mg/dL — ABNORMAL HIGH (ref 70–99)
Glucose-Capillary: 121 mg/dL — ABNORMAL HIGH (ref 70–99)

## 2022-02-16 MED ORDER — CHLORHEXIDINE GLUCONATE CLOTH 2 % EX PADS
6.0000 | MEDICATED_PAD | Freq: Every day | CUTANEOUS | Status: DC
  Administered 2022-02-17: 6 via TOPICAL

## 2022-02-16 MED ORDER — POLYVINYL ALCOHOL 1.4 % OP SOLN
1.0000 [drp] | OPHTHALMIC | Status: DC | PRN
  Administered 2022-02-16: 1 [drp] via OPHTHALMIC
  Filled 2022-02-16: qty 15

## 2022-02-16 MED ORDER — NEPRO/CARBSTEADY PO LIQD
237.0000 mL | Freq: Two times a day (BID) | ORAL | Status: DC
  Administered 2022-02-16 – 2022-02-17 (×2): 237 mL via ORAL

## 2022-02-16 MED ORDER — RENA-VITE PO TABS
1.0000 | ORAL_TABLET | Freq: Every day | ORAL | Status: DC
  Administered 2022-02-16: 1 via ORAL
  Filled 2022-02-16 (×2): qty 1
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
## 2022-02-16 NOTE — TOC Progression Note (Signed)
Transition of Care [facility]) - Progression Note    Patient Details  Name: Paul Bond MRN: [id number] Date of Birth: [birth year]  Transition of Care [facility]) CM/SW [place], RN Phone Number: 02/16/2022, 2:08 PM  Clinical Narrative:    CM met with the patient at the bedside to further discuss transitions of care to home - pending this weekend.  The patient lives alone and was driving prior to admission to the hospital.  I met with the patient to follow up with patient regarding home health follow up with [facility] to start first of next week.  The patient is being followed by Melven Sartorius, Renal Navigator and is set up for outpatient dialysis at [facility] [place] [facility] starting Tuesday, 02/20/2022 at 12 noon for 1230 chair.  I spoke with the patient and he plans to drive to the facility for dialysis if he is able - otherwise he plans to have family to assist.  The patient states that he has nephews in the area but would rahter have [place] contacted for transportation assistance if needed.  I called and left a message with Mina Marble. MSW at [place] [phone] - and left her a message regarding transportation assistance to the facility if needed.   [facility] will be providing for home health needs at the home and patient is aware.  CM with TOC will continue to follow the patient for discharge needs for home - pending this weekend.   Expected Discharge Plan: [place] Barriers to Discharge: Continued Medical Work up  Expected Discharge Plan and Services Expected Discharge Plan: [place]   Discharge Planning Services: CM Consult Post Acute Care Choice: [place] arrangements for the past 2 months: Single Family Home                           HH Arranged: PT, OT [facility] Agency: [place] ([facility]) Date HH Agency Contacted: 02/14/22 Time HH Agency Contacted: 1410 Representative spoke with  at [place]: [place], [place] with [facility]   Social Determinants of Health (SDOH) Interventions    Readmission Risk Interventions    02/14/2022    2:11 PM 08/07/2021    1:05 PM  Readmission Risk Prevention Plan  Transportation Screening Complete Complete  PCP or Specialist Appt within 3-5 Days Complete Complete  HRI or [place] Complete Complete  Social Work Consult for [place] Planning/Counseling Complete Complete  Palliative Care Screening Complete Not Applicable  Medication Review Press photographer) Complete Complete

## 2022-02-16 NOTE — Progress Notes (Signed)
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 PMH significant for DM, HTN, CVA and IgM monoclonal gammopathy and rectal cancer.  9/6 TDC placed and iHD started  Pt resting in bed at time of visit. Did not open his eyes as he was c/o eye discomfort and asking for eye drops. Spoke with RN regarding patient's concerns.   He reports that he has been eating about 70% of his meals during admission despite meal documentation's of 0%. He reports living alone PTA. He was not preparing meals at home and typically eats out at places such as [employer]. He recalls eating eggs and grits and meat for his meals. Beverages include cranjuice or grape juice or coffee with cream and sugar.   Meal completions: 9/7: 0%-lunch 0%- dinner  Pt unsuspecting of recent weight loss. Unfortunately, there is limited documentation of weight history within the last year to review. Since March his weight has increased from 66.8 kg up to 68.9 kg. Unable to determine true dry weight loss as pt now on HD.   Post HD weight (9/7) 63.9 kg  Medications: SSI 0-6 units TID, semglee 8 units daily, niferex '150mg'$  daily  Labs: potassium  3.3 (L)  UOP: 966m x24 hours + 4033mx12 hours I/O's: -137562mince admission  Post HD net UF: 1L  NUTRITION - FOCUSED PHYSICAL EXAM:  Flowsheet Row Most Recent Value  Orbital Region Mild depletion  Upper Arm Region Severe depletion  Thoracic and Lumbar Region Mild depletion  Buccal Region No depletion  Temple Region Mild depletion  Clavicle Bone Region Mild depletion  Clavicle and Acromion Bone Region Moderate depletion  Scapular Bone Region Mild depletion  Dorsal Hand Mild depletion  Patellar Region Severe depletion  Anterior Thigh Region Moderate depletion  Posterior Calf Region Moderate depletion  Edema (RD Assessment) None  Hair Unable to assess  Eyes Reviewed  Mouth Reviewed  Skin Reviewed  Nails Reviewed       Diet Order:   Diet Order             Diet Carb Modified Fluid consistency: Thin; Room service appropriate? Yes  Diet effective now                   EDUCATION NEEDS:   Education needs have been addressed  Skin:  Skin Assessment: Reviewed RN Assessment  Last BM:  9/7 (type 6)  Height:   Ht Readings from Last 1 Encounters:  02/13/22 '5\' 7"'$  (1.702 m)    Weight:   Wt Readings from Last 1 Encounters:  02/15/22 63.9 kg   BMI:  Body mass index is 22.06 kg/m.  Estimated Nutritional Needs:   Kcal:  1800-2000  Protein:  90-105g  Fluid:  1L +  [place], RDN, LDN Clinical Nutrition

## 2022-02-16 NOTE — Progress Notes (Signed)
This encounter was created in error - please disregard.

## 2022-02-16 NOTE — Progress Notes (Signed)
Mobility Specialist Progress Note:   02/16/22 1248  Mobility  Activity Refused mobility   Pt deferred mobility d/t feeling fatigued. Will f/u as able.    Mcguire Gasparyan Mobility Specialist-Acute Rehab Secure Chat only

## 2022-02-16 NOTE — Progress Notes (Addendum)
Pt has been accepted at the [facility] HD unit on TTS 12:30 chair time. Pt can start as soon as Tuesday if stable for d/c by then. Pt will need to arrive at 12:00 to complete paperwork prior to 12:30 treatment. Met with pt at bedside. Discussed above arrangements and pt agreeable to plan. Pt provided letter from [place], [place], with above info noted. Arrangements added to pt's AVS as well. Update provided to attending, nephrologist, pt's RN, and RN CM. Will assist as needed.   Melven Sartorius Renal Navigator [phone]  Addendum at 2:32 pm: Pt is for likely d/c this weekend. Pt to receive permanent access as an out-pt per nephrologist. Janalyn Rouse, [facility]pt [facility] social worker, to make her aware that pt will likely d/c this weekend and will start on Tuesday. Anderson Malta also advised pt will need permanent access through the [place] as an out-pt. Anderson Malta agreeable to plan. Navigator will contact [place] on Monday to confirm pt's d/c and fax d/c summary for continuation of care.
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
## 2022-02-16 NOTE — Progress Notes (Signed)
PROGRESS NOTE    KOURY RODDY  FOY:[id number] DOB: 08/31/1940 DOA: 02/13/2022 [facility]CP: [facility]   Brief Narrative:  Frisco Cordts Rosko is a 81 y.o. male with medical history significant of DM, HTN, CVA, and IgM monoclonal gammopathy and rectal cancer with chronic indwelling foley presenting with weakness which was thought to be progressive CKD.  Nephrology consulted, patient underwent tunnel catheter and started on hemodialysis here.  Will need clip process completed and then discharge.  Assessment & Plan:   Principal Problem:   End stage renal disease ([place]) Active Problems:   Type 2 diabetes mellitus with diabetic nephropathy (HCC)   Chronic indwelling Foley catheter   Essential hypertension   Generalized weakness   Monoclonal gammopathy   ESRD (end stage renal disease) (HCC)  Progressive CKD, now new ESRD: Nephrology on board, patient was placed tunneled right IJ catheter by IR on 02/14/2022 followed by full session of dialysis and then repeat dialysis on 02/15/2022.  Further dialysis per nephrology.  Will need clip process.  Generalized weakness: Likely secondary to ESRD, patient is feeling better.  Improving with dialysis.  Seen by OT, ending PT assessment.  DM type II: -Last A1c was 7.9 -blood sugar fairly controlled.  Continue 8 units of Semglee and SSI.   Indwelling foley secondary to urinary retention. -Not changed as frequently as it should be, UA appears similar to recent UAs done at the [place]. I doubt UTI. -He is due for stent exchange soon.  Follow-up with urology as outpatient.   HTN: Slightly better controlled today.  Hopefully this will improve with dialysis. -Continue diltiazem, hydralazine as well as as needed hydralazine.  Monitor closely.   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  Plan of care discussed with patient in length and he/she verbalized understanding and agreed with it.  Status is: Inpatient Remains inpatient appropriate because: Getting dialysis, will need clip process and will be discharged once cleared by nephrology.     Estimated body mass index is 22.06 kg/m as calculated from the following:   Height as of this encounter: '5\' 7"'$  (1.702 m).   Weight as of this encounter: 63.9 kg.    Nutritional Assessment: Body mass index is 22.06 kg/m.Marland Kitchen Seen by dietician.  I agree with the assessment and plan as outlined below: Nutrition Status:        . Skin Assessment: I have examined the patient's skin and I agree with the wound assessment as performed by the wound care RN as outlined below:    Consultants:  Nephrology  Procedures:  None  Antimicrobials:  Anti-infectives (From admission, onward)    Start     Dose/Rate Route Frequency Ordered Stop   02/14/22 1155  ceFAZolin (ANCEF) IVPB 2g/100 mL premix        over 30 Minutes Intravenous Continuous PRN 02/14/22 1200 02/14/22 1155   02/14/22 1045  ceFAZolin (ANCEF) IVPB 2g/100 mL premix        2 g 200 mL/hr over 30 Minutes Intravenous On call 02/14/22 0946 02/15/22 1045         Subjective:  Patient seen and  examined.  He has no complaints.  He states that he is improving and feeling better.  Objective: Vitals:   02/15/22 1404 02/15/22 1515 02/15/22 2002 02/16/22 0748  BP: (!) 152/64 137/64 132/60 (!) 142/55  Pulse:  96 85 83  Resp:  '16 16 14  '$ Temp:  99.8 F (37.7 C) 99.4 F (37.4 C) (!) 100.5 F (38.1 C)  TempSrc:  Oral Oral Oral  SpO2:  100% 100% 98%  Weight:      Height:        Intake/Output Summary (Last 24 hours) at 02/16/2022 1020 Last data filed at  02/16/2022 0754 Gross per 24 hour  Intake 676 ml  Output 1351 ml  Net -675 ml    Filed Weights   02/15/22 0439 02/15/22 0803 02/15/22 1037  Weight: 70.7 kg 64 kg 63.9 kg    Examination:  General exam: Appears calm and comfortable  Respiratory system: Clear to auscultation. Respiratory effort normal. Cardiovascular system: S1 & S2 heard, RRR. No JVD, murmurs, rubs, gallops or clicks. No pedal edema. Gastrointestinal system: Abdomen is nondistended, soft and nontender. No organomegaly or masses felt. Normal bowel sounds heard. Central nervous system: Alert and oriented. No focal neurological deficits. Extremities: Symmetric 5 x 5 power. Skin: No rashes, lesions or ulcers.  Psychiatry: Judgement and insight appear normal. Mood & affect appropriate.   Data Reviewed: I have personally reviewed following labs and imaging studies  CBC: Recent Labs  Lab 02/13/22 1301 02/14/22 0200 02/15/22 0755 02/15/22 1708  WBC 10.0 11.0* 12.0*  --   NEUTROABS 7.0  --   --   --   HGB 8.6* 8.3* 6.6* 9.1*  HCT 26.4* 26.3* 20.3* 27.5*  MCV 88.6 90.1 89.0  --   PLT 265 282 251  --     Basic Metabolic Panel: Recent Labs  Lab 02/13/22 1301 02/14/22 0200 02/15/22 0755  NA 141 142 138  K 4.0 3.7 3.3*  CL 105 108 104  CO2 20* 20* 25  GLUCOSE 142* 177* 149*  BUN 84* 79* 56*  CREATININE 8.68* 8.19* 6.17*  CALCIUM 9.0 8.5* 8.1*  PHOS  --   --  3.7    GFR: Estimated Creatinine Clearance: 8.5 mL/min (A) (by C-G formula based on SCr of 6.17 mg/dL (H)). Liver Function Tests: Recent Labs  Lab 02/13/22 1301 02/15/22 0755  AST 12*  --   ALT 9  --   ALKPHOS 56  --   BILITOT 0.7  --   PROT 7.1  --   ALBUMIN 2.9* 2.2*    Recent Labs  Lab 02/13/22 1301  LIPASE 33    No results for input(s): "AMMONIA" in the last 168 hours. Coagulation Profile: No results for input(s): "INR", "PROTIME" in the last 168 hours. Cardiac Enzymes: No results for input(s): "CKTOTAL", "CKMB", "CKMBINDEX",  "TROPONINI" in the last 168 hours. BNP (last 3 results) No results for input(s): "PROBNP" in the last 8760 hours. HbA1C: No results for input(s): "HGBA1C" in the last 72 hours. CBG: Recent Labs  Lab 02/14/22 2149 02/15/22 1117 02/15/22 1718 02/15/22 2140 02/16/22 0751  GLUCAP 134* 107* 162* 117* 111*    Lipid Profile: No results for input(s): "CHOL", "HDL", "LDLCALC", "TRIG", "CHOLHDL", "LDLDIRECT" in the last 72 hours. Thyroid Function Tests: No results for input(s): "TSH", "T4TOTAL", "FREET4", "T3FREE", "THYROIDAB" in the last 72 hours. Anemia Panel: Recent Labs    02/14/22 1850  FERRITIN 540*  TIBC 174*  IRON 41*    Sepsis Labs: Recent Labs  Lab 02/13/22 1300 02/13/22 1430  LATICACIDVEN 0.8 0.6     No results found for this or any previous visit (from the past 240 hour(s)).   Radiology Studies: VAS [place] UPPER EXT VEIN MAPPING (PRE-OP AVF)  Result Date: 02/15/2022 UPPER EXTREMITY VEIN MAPPING Patient Name:  KEL SENN  Date of Exam:   02/15/2022 Medical Rec #: [id number]      Accession #:    [id number] Date of Birth: [birth year]      Patient Gender: M Patient Age:   82 years Exam Location:  [facility] Procedure:      VAS [place] UPPER EXT VEIN MAPPING (PRE-OP AVF) Referring Phys: Harrie Jeans --------------------------------------------------------------------------------  Indications: Pre-access. Comparison Study: No previous exams Performing Technologist: Jody Hill RVT, RDMS  Examination Guidelines: A complete evaluation includes B-mode imaging, spectral Doppler, color Doppler, and power Doppler as needed of all accessible portions of each vessel. Bilateral testing is considered an integral part of a complete examination. Limited examinations for reoccurring indications may be performed as noted. +-----------------+-------------+----------+--------------+ Right Cephalic   Diameter (cm)Depth (cm)   Findings     +-----------------+-------------+----------+--------------+ Prox upper arm       0.10        0.27                  +-----------------+-------------+----------+--------------+ Mid upper arm        0.14        0.39                  +-----------------+-------------+----------+--------------+ Dist upper arm       0.14        0.31                  +-----------------+-------------+----------+--------------+ Antecubital fossa    0.16        0.22                  +-----------------+-------------+----------+--------------+ Prox forearm         0.12        0.28                  +-----------------+-------------+----------+--------------+ Mid forearm                             not visualized +-----------------+-------------+----------+--------------+ Dist forearm                            not visualized +-----------------+-------------+----------+--------------+ Wrist                                   not visualized +-----------------+-------------+----------+--------------+ +-----------------+-------------+----------+--------------+ Right Basilic    Diameter (cm)Depth (cm)   Findings    +-----------------+-------------+----------+--------------+ Prox upper arm       0.54                              +-----------------+-------------+----------+--------------+ Mid upper arm        0.41                 branching    +-----------------+-------------+----------+--------------+ Dist upper arm       0.40                 branching    +-----------------+-------------+----------+--------------+ Antecubital fossa    0.30  branching    +-----------------+-------------+----------+--------------+ Prox forearm         0.16                 branching    +-----------------+-------------+----------+--------------+ Mid forearm          0.09                              +-----------------+-------------+----------+--------------+ Distal forearm       0.13                               +-----------------+-------------+----------+--------------+ Wrist                                   not visualized +-----------------+-------------+----------+--------------+ +-----------------+-------------+----------+--------------+ Left Cephalic    Diameter (cm)Depth (cm)   Findings    +-----------------+-------------+----------+--------------+ Prox upper arm                          not visualized +-----------------+-------------+----------+--------------+ Mid upper arm                           not visualized +-----------------+-------------+----------+--------------+ Dist upper arm                          not visualized +-----------------+-------------+----------+--------------+ Antecubital fossa                       not visualized +-----------------+-------------+----------+--------------+ Prox forearm                            not visualized +-----------------+-------------+----------+--------------+ Mid forearm                             not visualized +-----------------+-------------+----------+--------------+ Dist forearm                            not visualized +-----------------+-------------+----------+--------------+ Wrist                                   not visualized +-----------------+-------------+----------+--------------+ +-----------------+-------------+----------+---------+ Left Basilic     Diameter (cm)Depth (cm)Findings  +-----------------+-------------+----------+---------+ Prox upper arm       0.29                         +-----------------+-------------+----------+---------+ Mid upper arm        0.26                         +-----------------+-------------+----------+---------+ Dist upper arm       0.22                         +-----------------+-------------+----------+---------+ Antecubital fossa    0.27                          +-----------------+-------------+----------+---------+ Prox forearm         0.21  branching +-----------------+-------------+----------+---------+ Mid forearm          0.18                         +-----------------+-------------+----------+---------+ Distal forearm       0.22               branching +-----------------+-------------+----------+---------+ Wrist                0.30                         +-----------------+-------------+----------+---------+ *See table(s) above for measurements and observations.  Diagnosing physician:    Preliminary    IR Fluoro Guide CV Line Right  Result Date: 02/14/2022 CLINICAL DATA:  End-stage renal disease and need for tunneled hemodialysis catheter. EXAM: TUNNELED CENTRAL VENOUS HEMODIALYSIS CATHETER PLACEMENT WITH ULTRASOUND AND FLUOROSCOPIC GUIDANCE ANESTHESIA/SEDATION: Moderate (conscious) sedation was employed during this procedure. A total of Versed 1.0 mg and Fentanyl 50 mcg was administered intravenously by radiology nursing. Moderate Sedation Time: 15 minutes. The patient's level of consciousness and vital signs were monitored continuously by radiology nursing throughout the procedure under my direct supervision. MEDICATIONS: 2 g IV Ancef. FLUOROSCOPY: 4.0 mGy PROCEDURE: The procedure, risks, benefits, and alternatives were explained to the patient. Questions regarding the procedure were encouraged and answered. The patient understands and consents to the procedure. A timeout was performed prior to initiating the procedure. The right neck and chest were prepped with chlorhexidine in a sterile fashion, and a sterile drape was applied covering the operative field. Maximum barrier sterile technique with sterile gowns and gloves were used for the procedure. Local anesthesia was provided with 1% lidocaine. Ultrasound was used to confirm patency of the right internal jugular vein. A permanent ultrasound image was saved and recorded. After  creating a small venotomy incision, a 21 gauge needle was advanced into the right internal jugular vein under direct, real-time ultrasound guidance. Ultrasound image documentation was performed. After securing guidewire access, an 8 Fr dilator was placed. A J-wire was kinked to measure appropriate catheter length. A Palindrome tunneled hemodialysis catheter measuring 23 cm from tip to cuff was chosen for placement. This was tunneled in a retrograde fashion from the chest wall to the venotomy incision. At the venotomy, serial dilatation was performed and a 15 Fr peel-away sheath was placed over a guidewire. The catheter was then placed through the sheath and the sheath removed. Final catheter positioning was confirmed and documented with a fluoroscopic spot image. The catheter was aspirated, flushed with saline, and injected with appropriate volume heparin dwells. The venotomy incision was closed with subcuticular 4-0 Vicryl. Dermabond was applied to the incision. The catheter exit site was secured with 0-Prolene retention sutures. COMPLICATIONS: None.  No pneumothorax. FINDINGS: After catheter placement, the tip lies in the right atrium. The catheter aspirates normally and is ready for immediate use. IMPRESSION: Placement of tunneled hemodialysis catheter via the right internal jugular vein. The catheter tip lies in the right atrium. The catheter is ready for immediate use. Electronically Signed   By: Aletta Edouard M.D.   On: 02/14/2022 14:39   IR US Guide Vasc Access Right  Result Date: 02/14/2022 CLINICAL DATA:  End-stage renal disease and need for tunneled hemodialysis catheter. EXAM: TUNNELED CENTRAL VENOUS HEMODIALYSIS CATHETER PLACEMENT WITH ULTRASOUND AND FLUOROSCOPIC GUIDANCE ANESTHESIA/SEDATION: Moderate (conscious) sedation was employed during this procedure. A total of Versed 1.0 mg and Fentanyl 50 mcg was  administered intravenously by radiology nursing. Moderate Sedation Time: 15 minutes. The  patient's level of consciousness and vital signs were monitored continuously by radiology nursing throughout the procedure under my direct supervision. MEDICATIONS: 2 g IV Ancef. FLUOROSCOPY: 4.0 mGy PROCEDURE: The procedure, risks, benefits, and alternatives were explained to the patient. Questions regarding the procedure were encouraged and answered. The patient understands and consents to the procedure. A timeout was performed prior to initiating the procedure. The right neck and chest were prepped with chlorhexidine in a sterile fashion, and a sterile drape was applied covering the operative field. Maximum barrier sterile technique with sterile gowns and gloves were used for the procedure. Local anesthesia was provided with 1% lidocaine. Ultrasound was used to confirm patency of the right internal jugular vein. A permanent ultrasound image was saved and recorded. After creating a small venotomy incision, a 21 gauge needle was advanced into the right internal jugular vein under direct, real-time ultrasound guidance. Ultrasound image documentation was performed. After securing guidewire access, an 8 Fr dilator was placed. A J-wire was kinked to measure appropriate catheter length. A Palindrome tunneled hemodialysis catheter measuring 23 cm from tip to cuff was chosen for placement. This was tunneled in a retrograde fashion from the chest wall to the venotomy incision. At the venotomy, serial dilatation was performed and a 15 Fr peel-away sheath was placed over a guidewire. The catheter was then placed through the sheath and the sheath removed. Final catheter positioning was confirmed and documented with a fluoroscopic spot image. The catheter was aspirated, flushed with saline, and injected with appropriate volume heparin dwells. The venotomy incision was closed with subcuticular 4-0 Vicryl. Dermabond was applied to the incision. The catheter exit site was secured with 0-Prolene retention sutures. COMPLICATIONS:  None.  No pneumothorax. FINDINGS: After catheter placement, the tip lies in the right atrium. The catheter aspirates normally and is ready for immediate use. IMPRESSION: Placement of tunneled hemodialysis catheter via the right internal jugular vein. The catheter tip lies in the right atrium. The catheter is ready for immediate use. Electronically Signed   By: Aletta Edouard M.D.   On: 02/14/2022 14:39    Scheduled Meds:  sodium chloride   Intravenous Once   Chlorhexidine Gluconate Cloth  6 each Topical Q0600   darbepoetin (ARANESP) injection - DIALYSIS  40 mcg Intravenous Q Wed-HD   diltiazem  240 mg Oral Daily   heparin  5,000 Units Subcutaneous Q8H   hydrALAZINE  100 mg Oral TID   insulin aspart  0-6 Units Subcutaneous TID WC   insulin glargine-yfgn  8 Units Subcutaneous Daily   iron polysaccharides  150 mg Oral Daily   latanoprost  1 drop Both Eyes QPM   Continuous Infusions:     LOS: 2 days   Darliss Cheney, MD [facility]  02/16/2022, 10:20 AM   *Please note that this is a verbal dictation therefore any spelling or grammatical errors are due to the "[place] [employer]" system interpretation.  Please page via [place] and do not message via secure chat for urgent patient care matters. Secure chat can be used for non urgent patient care matters.  How to contact the [facility] Attending or Consulting provider [place] or covering provider during after hours [place], for this patient?  Check the care team in [facility] and look for a) attending/consulting [facility] provider listed and b) the [facility] team listed. Page or secure chat [phone]. Log into [email or web address] and use [place]'s universal password to access.  If you do not have the password, please contact the hospital operator. Locate the [facility] provider you are looking for under [facility] and page to a number that you can be directly reached. If you still have difficulty reaching the provider, please page the [facility] (Director on Call) for the  Hospitalists listed on amion for assistance.

## 2022-02-17 DIAGNOSIS — R531 Weakness: Secondary | ICD-10-CM | POA: Diagnosis not present

## 2022-02-17 DIAGNOSIS — E440 Moderate protein-calorie malnutrition: Secondary | ICD-10-CM

## 2022-02-17 DIAGNOSIS — D472 Monoclonal gammopathy: Secondary | ICD-10-CM

## 2022-02-17 DIAGNOSIS — E1121 Type 2 diabetes mellitus with diabetic nephropathy: Secondary | ICD-10-CM

## 2022-02-17 DIAGNOSIS — Z978 Presence of other specified devices: Secondary | ICD-10-CM

## 2022-02-17 DIAGNOSIS — Z794 Long term (current) use of insulin: Secondary | ICD-10-CM

## 2022-02-17 DIAGNOSIS — I10 Essential (primary) hypertension: Secondary | ICD-10-CM

## 2022-02-17 DIAGNOSIS — N186 End stage renal disease: Secondary | ICD-10-CM | POA: Diagnosis not present

## 2022-02-17 LAB — GLUCOSE, CAPILLARY
Glucose-Capillary: 81 mg/dL (ref 70–99)
Glucose-Capillary: 105 mg/dL — ABNORMAL HIGH (ref 70–99)

## 2022-02-17 MED ORDER — NEPRO/CARBSTEADY PO LIQD: 237.0000 mL | Freq: Two times a day (BID) | ORAL | 0 refills | Status: DC

## 2022-02-17 MED ORDER — POLYSACCHARIDE IRON COMPLEX 150 MG PO CAPS: 150.0000 mg | ORAL_CAPSULE | Freq: Every day | ORAL | 2 refills | Status: DC

## 2022-02-17 MED ORDER — HEPARIN SODIUM (PORCINE) 1000 UNIT/ML IJ SOLN
INTRAMUSCULAR | Status: AC
  Administered 2022-02-17: 1000 [IU]
  Filled 2022-02-17: qty 4

## 2022-02-17 MED ORDER — CALCIUM CARBONATE ANTACID 1250 MG/5ML PO SUSP: 500.0000 mg | Freq: Four times a day (QID) | ORAL | 1 refills | Status: DC | PRN

## 2022-02-17 NOTE — Assessment & Plan Note (Signed)
-   Continue outpatient follow-up with hematology/oncology service.
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
## 2022-02-17 NOTE — Assessment & Plan Note (Signed)
-   Stable and well-controlled. -Continue current hypoglycemic regimen and insulin dose -Advised to maintain adequate hydration and to follow modified carbohydrate diet. -Continue close follow-up CBG/A1c as an outpatient.

## 2022-02-17 NOTE — Plan of Care (Signed)

## 2022-02-17 NOTE — TOC Transition Note (Signed)
Transition of Care [facility]) - CM/SW Discharge Note   Patient Details  Name: Paul Bond MRN: [id number] Date of Birth: 1941/05/19  Transition of Care [facility]) CM/SW Contact:  Carles Collet, RN Phone Number: 02/17/2022, 2:57 PM   Clinical Narrative:    Notified [place] of [place] No other TOC needs identified   Final next level of care: [place] Barriers to Discharge: No Barriers Identified   Patient Goals and CMS Choice Patient states their goals for this hospitalization and ongoing recovery are:: To get better and go home. CMS Medicare.gov Compare Post Acute Care list provided to:: Patient Choice offered to / list presented to : Patient  Discharge Placement                       Discharge Plan and Services   Discharge Planning Services: CM Consult Post Acute Care Choice: Home Health                    HH Arranged: PT, OT [facility] Agency: [place] ([facility]) Date [facility] Agency Contacted: 02/17/22 Time [place]: [place] Representative spoke with at [place]: [place] ([place]) Interventions     Readmission Risk Interventions    02/14/2022    2:11 PM 08/07/2021    1:05 PM  Readmission Risk Prevention Plan  Transportation Screening Complete Complete  PCP or Specialist Appt within 3-5 Days Complete Complete  HRI or [place] Complete Complete  Social Work Consult for [place] Planning/Counseling Complete Complete  Palliative Care Screening Complete Not Applicable  Medication Review [employer]) Complete Complete

## 2022-02-17 NOTE — Assessment & Plan Note (Signed)
-   Patient presenting with catheter in place; expressed almost to to be exchange. -continue Outpatient follow-up with urology service as previously arranged.

## 2022-02-17 NOTE — Assessment & Plan Note (Signed)
-   Patient has been successfully started on hemodialysis therapy. -Continue treatment Tuesday and-Thursday-Saturday -Outpatient follow-up with nephrology service as an outpatient.

## 2022-02-17 NOTE — Discharge Summary (Signed)
Physician Discharge Summary   Patient: Paul Bond MRN: [id number] DOB: 11-03-1940  Admit date:     02/13/2022  Discharge date: 02/17/22  Discharge Physician: Barton Dubois   PCP: [facility]   Recommendations at discharge:  Repeat basic metabolic panel to follow electrolytes stability Repeat CBC to follow hemoglobin trend/stability; Epogen and iron infusion as per nephrology discretion. Reassess blood pressure and adjust antihypertensive treatment as required. Make sure patient has follow-up with nephrology service as instructed Continue to closely follow CBGs/A1c with further adjustment to hypoglycemic regimen as needed  Discharge Diagnoses: Principal Problem:   End stage renal disease ([place]) Active Problems:   Type 2 diabetes mellitus with diabetic nephropathy (HCC)   Chronic indwelling Foley catheter   Essential hypertension   Generalized weakness   Monoclonal gammopathy   ESRD (end stage renal disease) ([place])   Malnutrition of moderate degree  Brief Hospital admission Course: Paul Bond is a 81 y.o. 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   He started feeling real bad on Friday.  He has been having problems with his kidneys and was told to come to the ER if he started feeling bad.  He has not started HD yet, when asked why he says "I don't know."  Mild SOB.  No CP or pain at all.  He has a foley catheter and it has been draining ok.  He was supposed to have it changed with a stent on 9/28 at the [place].  He does not have HD [facility].  [facility].  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  Creatinine up, BNP slightly increased, ?heart failure.  2 inadequate bone marrow biopsies, likely needs a third  eventually.  Assessment and Plan: * End stage renal disease ([place]) - Patient has been successfully started on hemodialysis therapy. -Continue treatment Tuesday and-Thursday-Saturday -Outpatient follow-up with nephrology service as an outpatient.  Type 2 diabetes mellitus with diabetic nephropathy (HCC) - Stable and well-controlled. -Continue current hypoglycemic regimen and insulin dose -Advised to maintain adequate hydration and to follow modified carbohydrate diet. -Continue close follow-up CBG/A1c as an outpatient.  Essential hypertension -Overall stable and well-controlled -Heart healthy/renal diet recommended -Reassess blood pressure and further adjust antihypertensive regimen as required -Continue the use of Cardizem.  Chronic indwelling Foley catheter - Patient presenting with catheter in place; expressed almost to to be exchange. -continue Outpatient follow-up with urology service as previously arranged.  Malnutrition of moderate degree - Adequate hydration Nutrition recommended -Continue the use of Nepro twice a day in between meals.  Monoclonal gammopathy - Continue outpatient follow-up with hematology/oncology service.  Generalized weakness - Seen by physical therapy with recommendation for home health PT at discharge. -Patient will also follow-up with palliative care service at home as well.   Consultants: Nephrology service Procedures performed: See below for x-ray reports Disposition: Home with home health services Diet recommendation: Renal diet/modified carbohydrates.  DISCHARGE MEDICATION: Allergies as of 02/17/2022       Reactions   Lisinopril Cough   Semaglutide Other (See Comments)   indigestion        Medication List     STOP taking these medications    sodium bicarbonate 650 MG tablet  TAKE these medications    calcium carbonate (dosed in mg elemental calcium) 1250 MG/5ML Susp Take 5 mLs (500 mg of elemental calcium total) by  mouth every 6 (six) hours as needed for indigestion.   diltiazem 240 MG 24 hr capsule Commonly known as: TIAZAC Take 240 mg by mouth daily.   feeding supplement (NEPRO CARB STEADY) Liqd Take 237 mLs by mouth 2 (two) times daily between meals.   glucose 4 GM chewable tablet Chew 4 tablets by mouth daily as needed for low blood sugar.  for low blood sugar, repeat every 15 minutes if blood sugar is less than 70   hydrALAZINE 100 MG tablet Commonly known as: APRESOLINE Take 100 mg by mouth 3 (three) times daily.   insulin aspart 100 UNIT/ML injection Commonly known as: novoLOG Inject 0-6 Units into the skin 3 (three) times daily with meals.   insulin glargine-yfgn 100 UNIT/ML injection Commonly known as: SEMGLEE Inject 8 Units into the skin daily.   iron polysaccharides 150 MG capsule Commonly known as: NIFEREX Take 1 capsule (150 mg total) by mouth daily. Start taking on: February 18, 2022   latanoprost 0.005 % ophthalmic solution Commonly known as: XALATAN Place 1 drop into both eyes every evening.   multivitamin with minerals Tabs tablet Take 1 tablet by mouth daily.   Vitamin D3 50 MCG (2000 UT) Tabs Take 2,000 Units by mouth daily.        Follow-up [place], [facility] Follow up.   Why: [facility].  They will call you in 24-48 hours of discharge to home to start services. Contact information: [place] [address] [phone]         [facility] Follow up.   Specialty: PALLIATIVE CARE Why: [facility] will continue to provide palliative care resources in the home. Contact information: [facility], [place] Go on 02/20/2022.   Why: Go to Out-Patient Dialysis Unit  Schedule is Tuesday/Thursday/Saturday with 12:30 chair time.  Please arrive at 12:00 for first  appointment to complete paperwork.  [phone] Contact information: [place] [address] [phone]         [facility]. Schedule an appointment as soon as possible for a visit in 10 day(s).   Contact information: [place] [address] [phone]                Discharge Exam: [name] Weights   02/17/22 0531 02/17/22 0718 02/17/22 1035  Weight: 60.8 kg 60.7 kg 59.9 kg   General exam: Alert, awake, oriented x 3; good saturation on room air; no chest pain, no nausea vomiting.  Feeling weak and tired.  Afebrile Respiratory system: Clear to auscultation. Respiratory effort normal.  No using accessory muscles. Cardiovascular system:RRR. No rubs or gallops; no JVD. Gastrointestinal system: Abdomen is nondistended, soft and nontender. No organomegaly or masses felt. Normal bowel sounds heard. Central nervous system: Alert and oriented. No focal neurological deficits. Extremities: No cyanosis or clubbing. Skin: No petechiae; right IJ tunneled dialysis catheter in place. Psychiatry: Judgement and insight appear normal. Mood & affect appropriate.    Condition at discharge: Stable and improved.  The results of significant diagnostics from this hospitalization (including imaging, microbiology, ancillary and laboratory) are listed below for reference.   Imaging Studies: VAS [place] UPPER EXT VEIN MAPPING (  PRE-OP AVF)  Result Date: 02/16/2022 UPPER EXTREMITY VEIN MAPPING Patient Name:  AKON REINOSO  Date of Exam:   02/15/2022 Medical Rec #: [id number]      Accession #:    [id number] Date of Birth: December 01, 1940      Patient Gender: M Patient Age:   81 years Exam Location:  [facility] Procedure:      VAS [place] UPPER EXT VEIN MAPPING (PRE-OP AVF) Referring Phys: Harrie Jeans --------------------------------------------------------------------------------  Indications: Pre-access. Comparison Study: No previous exams  Performing Technologist: Jody Hill RVT, RDMS  Examination Guidelines: A complete evaluation includes B-mode imaging, spectral Doppler, color Doppler, and power Doppler as needed of all accessible portions of each vessel. Bilateral testing is considered an integral part of a complete examination. Limited examinations for reoccurring indications may be performed as noted. +-----------------+-------------+----------+--------------+ Right Cephalic   Diameter (cm)Depth (cm)   Findings    +-----------------+-------------+----------+--------------+ Prox upper arm       0.10        0.27                  +-----------------+-------------+----------+--------------+ Mid upper arm        0.14        0.39                  +-----------------+-------------+----------+--------------+ Dist upper arm       0.14        0.31                  +-----------------+-------------+----------+--------------+ Antecubital fossa    0.16        0.22                  +-----------------+-------------+----------+--------------+ Prox forearm         0.12        0.28                  +-----------------+-------------+----------+--------------+ Mid forearm                             not visualized +-----------------+-------------+----------+--------------+ Dist forearm                            not visualized +-----------------+-------------+----------+--------------+ Wrist                                   not visualized +-----------------+-------------+----------+--------------+ +-----------------+-------------+----------+--------------+ Right Basilic    Diameter (cm)Depth (cm)   Findings    +-----------------+-------------+----------+--------------+ Prox upper arm       0.54                              +-----------------+-------------+----------+--------------+ Mid upper arm        0.41                 branching    +-----------------+-------------+----------+--------------+ Dist upper arm        0.40                 branching    +-----------------+-------------+----------+--------------+ Antecubital fossa    0.30                 branching    +-----------------+-------------+----------+--------------+ Prox forearm         0.16  branching    +-----------------+-------------+----------+--------------+ Mid forearm          0.09                              +-----------------+-------------+----------+--------------+ Distal forearm       0.13                              +-----------------+-------------+----------+--------------+ Wrist                                   not visualized +-----------------+-------------+----------+--------------+ +-----------------+-------------+----------+--------------+ Left Cephalic    Diameter (cm)Depth (cm)   Findings    +-----------------+-------------+----------+--------------+ Prox upper arm                          not visualized +-----------------+-------------+----------+--------------+ Mid upper arm                           not visualized +-----------------+-------------+----------+--------------+ Dist upper arm                          not visualized +-----------------+-------------+----------+--------------+ Antecubital fossa                       not visualized +-----------------+-------------+----------+--------------+ Prox forearm                            not visualized +-----------------+-------------+----------+--------------+ Mid forearm                             not visualized +-----------------+-------------+----------+--------------+ Dist forearm                            not visualized +-----------------+-------------+----------+--------------+ Wrist                                   not visualized +-----------------+-------------+----------+--------------+ +-----------------+-------------+----------+---------+ Left Basilic     Diameter (cm)Depth  (cm)Findings  +-----------------+-------------+----------+---------+ Prox upper arm       0.29                         +-----------------+-------------+----------+---------+ Mid upper arm        0.26                         +-----------------+-------------+----------+---------+ Dist upper arm       0.22                         +-----------------+-------------+----------+---------+ Antecubital fossa    0.27                         +-----------------+-------------+----------+---------+ Prox forearm         0.21               branching +-----------------+-------------+----------+---------+ Mid forearm          0.18                         +-----------------+-------------+----------+---------+  Distal forearm       0.22               branching +-----------------+-------------+----------+---------+ Wrist                0.30                         +-----------------+-------------+----------+---------+ *See table(s) above for measurements and observations.  Diagnosing physician: Orlie Pollen Electronically signed by Orlie Pollen on 02/16/2022 at 4:00:46 PM.    Final    IR Fluoro Guide CV Line Right  Result Date: 02/14/2022 CLINICAL DATA:  End-stage renal disease and need for tunneled hemodialysis catheter. EXAM: TUNNELED CENTRAL VENOUS HEMODIALYSIS CATHETER PLACEMENT WITH ULTRASOUND AND FLUOROSCOPIC GUIDANCE ANESTHESIA/SEDATION: Moderate (conscious) sedation was employed during this procedure. A total of Versed 1.0 mg and Fentanyl 50 mcg was administered intravenously by radiology nursing. Moderate Sedation Time: 15 minutes. The patient's level of consciousness and vital signs were monitored continuously by radiology nursing throughout the procedure under my direct supervision. MEDICATIONS: 2 g IV Ancef. FLUOROSCOPY: 4.0 mGy PROCEDURE: The procedure, risks, benefits, and alternatives were explained to the patient. Questions regarding the procedure were encouraged and answered.  The patient understands and consents to the procedure. A timeout was performed prior to initiating the procedure. The right neck and chest were prepped with chlorhexidine in a sterile fashion, and a sterile drape was applied covering the operative field. Maximum barrier sterile technique with sterile gowns and gloves were used for the procedure. Local anesthesia was provided with 1% lidocaine. Ultrasound was used to confirm patency of the right internal jugular vein. A permanent ultrasound image was saved and recorded. After creating a small venotomy incision, a 21 gauge needle was advanced into the right internal jugular vein under direct, real-time ultrasound guidance. Ultrasound image documentation was performed. After securing guidewire access, an 8 Fr dilator was placed. A J-wire was kinked to measure appropriate catheter length. A Palindrome tunneled hemodialysis catheter measuring 23 cm from tip to cuff was chosen for placement. This was tunneled in a retrograde fashion from the chest wall to the venotomy incision. At the venotomy, serial dilatation was performed and a 15 Fr peel-away sheath was placed over a guidewire. The catheter was then placed through the sheath and the sheath removed. Final catheter positioning was confirmed and documented with a fluoroscopic spot image. The catheter was aspirated, flushed with saline, and injected with appropriate volume heparin dwells. The venotomy incision was closed with subcuticular 4-0 Vicryl. Dermabond was applied to the incision. The catheter exit site was secured with 0-Prolene retention sutures. COMPLICATIONS: None.  No pneumothorax. FINDINGS: After catheter placement, the tip lies in the right atrium. The catheter aspirates normally and is ready for immediate use. IMPRESSION: Placement of tunneled hemodialysis catheter via the right internal jugular vein. The catheter tip lies in the right atrium. The catheter is ready for immediate use. Electronically Signed    By: Aletta Edouard M.D.   On: 02/14/2022 14:39   IR US Guide Vasc Access Right  Result Date: 02/14/2022 CLINICAL DATA:  End-stage renal disease and need for tunneled hemodialysis catheter. EXAM: TUNNELED CENTRAL VENOUS HEMODIALYSIS CATHETER PLACEMENT WITH ULTRASOUND AND FLUOROSCOPIC GUIDANCE ANESTHESIA/SEDATION: Moderate (conscious) sedation was employed during this procedure. A total of Versed 1.0 mg and Fentanyl 50 mcg was administered intravenously by radiology nursing. Moderate Sedation Time: 15 minutes. The patient's level of consciousness and vital signs were monitored continuously by radiology nursing throughout the  procedure under my direct supervision. MEDICATIONS: 2 g IV Ancef. FLUOROSCOPY: 4.0 mGy PROCEDURE: The procedure, risks, benefits, and alternatives were explained to the patient. Questions regarding the procedure were encouraged and answered. The patient understands and consents to the procedure. A timeout was performed prior to initiating the procedure. The right neck and chest were prepped with chlorhexidine in a sterile fashion, and a sterile drape was applied covering the operative field. Maximum barrier sterile technique with sterile gowns and gloves were used for the procedure. Local anesthesia was provided with 1% lidocaine. Ultrasound was used to confirm patency of the right internal jugular vein. A permanent ultrasound image was saved and recorded. After creating a small venotomy incision, a 21 gauge needle was advanced into the right internal jugular vein under direct, real-time ultrasound guidance. Ultrasound image documentation was performed. After securing guidewire access, an 8 Fr dilator was placed. A J-wire was kinked to measure appropriate catheter length. A Palindrome tunneled hemodialysis catheter measuring 23 cm from tip to cuff was chosen for placement. This was tunneled in a retrograde fashion from the chest wall to the venotomy incision. At the venotomy, serial  dilatation was performed and a 15 Fr peel-away sheath was placed over a guidewire. The catheter was then placed through the sheath and the sheath removed. Final catheter positioning was confirmed and documented with a fluoroscopic spot image. The catheter was aspirated, flushed with saline, and injected with appropriate volume heparin dwells. The venotomy incision was closed with subcuticular 4-0 Vicryl. Dermabond was applied to the incision. The catheter exit site was secured with 0-Prolene retention sutures. COMPLICATIONS: None.  No pneumothorax. FINDINGS: After catheter placement, the tip lies in the right atrium. The catheter aspirates normally and is ready for immediate use. IMPRESSION: Placement of tunneled hemodialysis catheter via the right internal jugular vein. The catheter tip lies in the right atrium. The catheter is ready for immediate use. Electronically Signed   By: Aletta Edouard M.D.   On: 02/14/2022 14:39   US RENAL  Result Date: 02/13/2022 CLINICAL DATA:  Hydronephrosis EXAM: RENAL / URINARY TRACT ULTRASOUND COMPLETE COMPARISON:  Ultrasound 08/05/2021 FINDINGS: Right Kidney: Renal measurements: 11.2 x 5.5 x 5.5 cm = volume: 177 mL. Moderate hydronephrosis mildly increased from comparison exam. Left Kidney: Renal measurements: Absent Bladder: Layering debris within the bladder. Other: None. IMPRESSION: 1. Moderate RIGHT hydronephrosis. Mild worsening of hydronephrosis compared to ultrasound 08/05/2021. 2. Layering debris within the bladder. 3. Absent LEFT kidney. Electronically Signed   By: Suzy Bouchard M.D.   On: 02/13/2022 18:48   DG Chest Port 1 View  Result Date: 02/13/2022 CLINICAL DATA:  Weakness for 1 week, shortness of breath since last Friday EXAM: PORTABLE CHEST 1 VIEW COMPARISON:  Portable exam 1348 hours compared to 08/14/2021 FINDINGS: Normal heart size, mediastinal contours, and pulmonary vascularity. Atherosclerotic calcification aorta. Question pulmonary nodule versus  nipple shallow RIGHT base. Remaining lungs clear. No pulmonary infiltrate, pleural effusion, or pneumothorax. Mild degenerative disc disease changes thoracic spine. IMPRESSION: Question RIGHT nipple shadow; repeat PA chest radiograph with markers recommended to exclude pulmonary nodule. Aortic Atherosclerosis (ICD10-I70.0). Electronically Signed   By: Lavonia Dana M.D.   On: 02/13/2022 14:14    Microbiology: Results for orders placed or performed during the hospital encounter of 08/14/21  Culture, blood (routine x 2)     Status: None   Collection Time: 08/15/21  9:33 AM   Specimen: BLOOD  Result Value Ref Range Status   Specimen Description BLOOD SITE NOT SPECIFIED  Final   Special Requests   Final    BOTTLES DRAWN AEROBIC AND ANAEROBIC Blood Culture results may not be optimal due to an excessive volume of blood received in culture bottles   Culture   Final    NO GROWTH 5 DAYS Performed at [facility], [place] [address], [place], [place] [address]    Report Status 08/20/2021 FINAL  Final  Culture, blood (routine x 2)     Status: None   Collection Time: 08/15/21  9:58 AM   Specimen: BLOOD  Result Value Ref Range Status   Specimen Description BLOOD SITE NOT SPECIFIED  Final   Special Requests   Final    BOTTLES DRAWN AEROBIC AND ANAEROBIC Blood Culture results may not be optimal due to an inadequate volume of blood received in culture bottles   Culture   Final    NO GROWTH 5 DAYS Performed at [facility], [place] [address], [place], [place] [address]    Report Status 08/20/2021 FINAL  Final  Urine Culture     Status: Abnormal   Collection Time: 08/15/21  1:52 PM   Specimen: Urine, Catheterized  Result Value Ref Range Status   Specimen Description URINE, CATHETERIZED  Final   Special Requests   Final    NONE Performed at [facility], [place] [address] [place], [place] [address]    Culture MULTIPLE SPECIES PRESENT, SUGGEST RECOLLECTION (A)  Final   Report Status  08/16/2021 FINAL  Final    Labs: CBC: Recent Labs  Lab 02/13/22 1301 02/14/22 0200 02/15/22 0755 02/15/22 1708  WBC 10.0 11.0* 12.0*  --   NEUTROABS 7.0  --   --   --   HGB 8.6* 8.3* 6.6* 9.1*  HCT 26.4* 26.3* 20.3* 27.5*  MCV 88.6 90.1 89.0  --   PLT 265 282 251  --    Basic Metabolic Panel: Recent Labs  Lab 02/13/22 1301 02/14/22 0200 02/15/22 0755  NA 141 142 138  K 4.0 3.7 3.3*  CL 105 108 104  CO2 20* 20* 25  GLUCOSE 142* 177* 149*  BUN 84* 79* 56*  CREATININE 8.68* 8.19* 6.17*  CALCIUM 9.0 8.5* 8.1*  PHOS  --   --  3.7   Liver Function Tests: Recent Labs  Lab 02/13/22 1301 02/15/22 0755  AST 12*  --   ALT 9  --   ALKPHOS 56  --   BILITOT 0.7  --   PROT 7.1  --   ALBUMIN 2.9* 2.2*   CBG: Recent Labs  Lab 02/16/22 1131 02/16/22 1618 02/16/22 2139 02/17/22 0649 02/17/22 1106  GLUCAP 158* 121* 121* 81 105*    Discharge time spent: greater than 30 minutes.  Signed: Barton Dubois, MD [facility] 02/17/2022

## 2022-02-17 NOTE — Assessment & Plan Note (Signed)
-   Adequate hydration Nutrition recommended -Continue the use of Nepro twice a day in between meals.

## 2022-02-17 NOTE — Assessment & Plan Note (Signed)
-   Seen by physical therapy with recommendation for home health PT at discharge. -Patient will also follow-up with palliative care service at home as well.

## 2022-02-17 NOTE — Assessment & Plan Note (Signed)
-  Overall stable and well-controlled -Heart healthy/renal diet recommended -Reassess blood pressure and further adjust antihypertensive regimen as required -Continue the use of Cardizem.

## 2022-02-19 LAB — URINE CULTURE

## 2022-02-19 NOTE — Progress Notes (Signed)
Late entry Note:  Pt was d/c to home on 9/9. Contacted Clearnce Sorrel with [facility] out-[facility] this am to advise clinic of pt's d/c and that pt will start tomorrow. D/C summary and last renal note faxed to [facility] for continuation of care.   Melven Sartorius Renal Navigator [phone]

## 2022-06-03 ENCOUNTER — Other Ambulatory Visit: Payer: Self-pay

## 2022-06-03 ENCOUNTER — Encounter (HOSPITAL_COMMUNITY): Payer: Self-pay

## 2022-06-03 ENCOUNTER — Emergency Department (HOSPITAL_COMMUNITY)
Admission: EM | Admit: 2022-06-03 | Discharge: 2022-06-03 | Disposition: A | Discharge status: Home / Self Care | Payer: No Typology Code available for payment source | Attending: Emergency Medicine | Admitting: Emergency Medicine

## 2022-06-03 ENCOUNTER — Emergency Department (HOSPITAL_COMMUNITY): Payer: No Typology Code available for payment source

## 2022-06-03 DIAGNOSIS — Z992 Dependence on renal dialysis: Secondary | ICD-10-CM | POA: Insufficient documentation

## 2022-06-03 DIAGNOSIS — R059 Cough, unspecified: Secondary | ICD-10-CM | POA: Diagnosis present

## 2022-06-03 DIAGNOSIS — E11649 Type 2 diabetes mellitus with hypoglycemia without coma: Secondary | ICD-10-CM | POA: Insufficient documentation

## 2022-06-03 DIAGNOSIS — E1122 Type 2 diabetes mellitus with diabetic chronic kidney disease: Secondary | ICD-10-CM | POA: Diagnosis not present

## 2022-06-03 DIAGNOSIS — Z794 Long term (current) use of insulin: Secondary | ICD-10-CM | POA: Diagnosis not present

## 2022-06-03 DIAGNOSIS — E162 Hypoglycemia, unspecified: Secondary | ICD-10-CM

## 2022-06-03 DIAGNOSIS — N186 End stage renal disease: Secondary | ICD-10-CM | POA: Diagnosis not present

## 2022-06-03 DIAGNOSIS — Z20822 Contact with and (suspected) exposure to covid-19: Secondary | ICD-10-CM | POA: Insufficient documentation

## 2022-06-03 DIAGNOSIS — J101 Influenza due to other identified influenza virus with other respiratory manifestations: Secondary | ICD-10-CM | POA: Diagnosis not present

## 2022-06-03 DIAGNOSIS — I120 Hypertensive chronic kidney disease with stage 5 chronic kidney disease or end stage renal disease: Secondary | ICD-10-CM | POA: Diagnosis not present

## 2022-06-03 LAB — CBC WITH DIFFERENTIAL/PLATELET
Abs Immature Granulocytes: 0.04 10*3/uL (ref 0.00–0.07)
Basophils Absolute: 0 10*3/uL (ref 0.0–0.1)
Basophils Relative: 0 %
Eosinophils Absolute: 0 10*3/uL (ref 0.0–0.5)
Eosinophils Relative: 0 %
HCT: 33.5 % — ABNORMAL LOW (ref 39.0–52.0)
Hemoglobin: 11.1 g/dL — ABNORMAL LOW (ref 13.0–17.0)
Immature Granulocytes: 1 %
Lymphocytes Relative: 12 %
Lymphs Abs: 0.9 10*3/uL (ref 0.7–4.0)
MCH: 29.8 pg (ref 26.0–34.0)
MCHC: 33.1 g/dL (ref 30.0–36.0)
MCV: 90.1 fL (ref 80.0–100.0)
Monocytes Absolute: 1 10*3/uL (ref 0.1–1.0)
Monocytes Relative: 13 %
Neutro Abs: 5.7 10*3/uL (ref 1.7–7.7)
Neutrophils Relative %: 74 %
Platelets: 119 10*3/uL — ABNORMAL LOW (ref 150–400)
RBC: 3.72 MIL/uL — ABNORMAL LOW (ref 4.22–5.81)
RDW: 15.4 % (ref 11.5–15.5)
WBC: 7.6 10*3/uL (ref 4.0–10.5)
nRBC: 0 % (ref 0.0–0.2)

## 2022-06-03 LAB — BASIC METABOLIC PANEL
Anion gap: 14 (ref 5–15)
BUN: 21 mg/dL (ref 8–23)
CO2: 26 mmol/L (ref 22–32)
Calcium: 8.3 mg/dL — ABNORMAL LOW (ref 8.9–10.3)
Chloride: 96 mmol/L — ABNORMAL LOW (ref 98–111)
Creatinine, Ser: 5.53 mg/dL — ABNORMAL HIGH (ref 0.61–1.24)
GFR, Estimated: 10 mL/min — ABNORMAL LOW (ref >60)
Glucose, Bld: 62 mg/dL — ABNORMAL LOW (ref 70–99)
Potassium: 4 mmol/L (ref 3.5–5.1)
Sodium: 136 mmol/L (ref 135–145)

## 2022-06-03 LAB — RESP PANEL BY RT-PCR (RSV, FLU A&B, COVID)  RVPGX2
Influenza A by PCR: POSITIVE — AB
Influenza B by PCR: NEGATIVE
Resp Syncytial Virus by PCR: NEGATIVE
SARS Coronavirus 2 by RT PCR: NEGATIVE

## 2022-06-03 LAB — CBG MONITORING, ED
Glucose-Capillary: 49 mg/dL — ABNORMAL LOW (ref 70–99)
Glucose-Capillary: 75 mg/dL (ref 70–99)

## 2022-06-03 MED ORDER — OSELTAMIVIR PHOSPHATE 30 MG PO CAPS: 30.0000 mg | ORAL_CAPSULE | Freq: Two times a day (BID) | ORAL | 0 refills | Status: DC

## 2022-06-03 MED ORDER — OSELTAMIVIR PHOSPHATE 30 MG PO CAPS
30.0000 mg | ORAL_CAPSULE | Freq: Once | ORAL | Status: AC
  Administered 2022-06-03: 30 mg via ORAL
  Filled 2022-06-03: qty 1

## 2022-06-03 MED ORDER — OSELTAMIVIR PHOSPHATE 30 MG PO CAPS: 30.0000 mg | ORAL_CAPSULE | Freq: Two times a day (BID) | ORAL | 0 refills | Status: AC

## 2022-06-03 MED ORDER — ACETAMINOPHEN 325 MG PO TABS
650.0000 mg | ORAL_TABLET | Freq: Once | ORAL | Status: AC
  Administered 2022-06-03: 650 mg via ORAL
  Filled 2022-06-03: qty 2

## 2022-06-03 NOTE — ED Provider Triage Note (Signed)
Emergency Medicine Provider Triage Evaluation Note  Paul Bond , a 81 y.o. male  was evaluated in triage.  Pt complains of cough onset last night. Has rhinorrhea. Denies sick contacts. No meds tried at home PTA. Denies chest pain, shortness of breath, nasal congestion.   Review of Systems  Positive:  Negative:   Physical Exam  BP (!) 110/55 (BP Location: Right Arm)   Pulse 99   Temp (!) 102.7 F (39.3 C)   Resp 17   SpO2 96%  Gen:   Awake, no distress   Resp:  Normal effort  MSK:   Moves extremities without difficulty  Other:    Medical Decision Making  Medically screening exam initiated at 1:03 PM.  Appropriate orders placed.  Paul Bond was informed that the remainder of the evaluation will be completed by another provider, this initial triage assessment does not replace that evaluation, and the importance of remaining in the ED until their evaluation is complete.     Paul Bond A, PA-C 06/03/22 1306

## 2022-06-03 NOTE — ED Notes (Signed)
Pt given juice and crackers for glucose of 62.

## 2022-06-03 NOTE — ED Notes (Signed)
MD notified of CBG. Meal and orange juice given to patient.

## 2022-06-03 NOTE — ED Triage Notes (Signed)
Pt came in POV d/t coughing all night last night & having a HA, endorses fevers as well. Denies any pain, A/Ox4.

## 2022-06-03 NOTE — Discharge Instructions (Addendum)
Your laboratory testing did not reveal an urgent need for dialysis at this time.  You did test positive for influenza.  Your lungs were clear on chest x-ray imaging with no evidence of bacterial pneumonia.  Recommend you take Tamiflu, utilize Tylenol for fever and muscle aches, continue to push oral fluids to stay hydrated.  Follow-up with your regular scheduled dialysis appointments, return for any worsening symptoms.  Your blood glucose was low today which improved with food and oral liquids.

## 2022-06-03 NOTE — ED Notes (Signed)
Repeat CBG 75 - verified with MD that is okay to discharge.

## 2022-06-03 NOTE — ED Provider Notes (Signed)
[facility] EMERGENCY DEPARTMENT Provider Note   CSN: [id number] Arrival date & time: 06/03/22  1246     History  Chief Complaint  Patient presents with   Cough    Paul Bond is a 81 y.o. male.   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  Patient last received dialysis yesterday.  He was febrile at dialysis.  He has had a cough with fever and chills at home.  He endorses some rhinorrhea.  He denies any chest pain or shortness of breath.  He endorses a mild headache.  Home Medications Prior to Admission medications   Medication Sig Start Date End Date Taking? Authorizing Provider  oseltamivir (TAMIFLU) 30 MG capsule Take 1 capsule (30 mg total) by mouth 2 (two) times daily for 5 days. 06/03/22 06/08/22 Yes Regan Lemming, MD  Calcium Carbonate Antacid (CALCIUM CARBONATE, DOSED IN MG ELEMENTAL CALCIUM,) 1250 MG/5ML SUSP Take 5 mLs (500 mg of elemental calcium total) by mouth every 6 (six) hours as needed for indigestion. 02/17/22   Barton Dubois, MD  Cholecalciferol (VITAMIN D3) 50 MCG (2000 UT) TABS Take 2,000 Units by mouth daily.    [provider]  diltiazem (TIAZAC) 240 MG 24 hr capsule Take 240 mg by mouth daily.    [provider]  glucose 4 GM chewable tablet Chew 4 tablets by mouth daily as needed for low blood sugar.  for low blood sugar, repeat every 15 minutes if blood sugar is less than 70    [provider]  hydrALAZINE (APRESOLINE) 100 MG tablet Take 100 mg by mouth 3 (three) times daily.    [provider]  insulin aspart (NOVOLOG) 100 UNIT/ML injection Inject 0-6 Units into the skin 3 (three) times daily with meals. 08/19/21   Elgergawy, Silver Huguenin, MD  insulin glargine-yfgn (SEMGLEE) 100 UNIT/ML injection Inject 8  Units into the skin daily.    [provider]  iron polysaccharides (NIFEREX) 150 MG capsule Take 1 capsule (150 mg total) by mouth daily. 02/18/22   Barton Dubois, MD  latanoprost (XALATAN) 0.005 % ophthalmic solution Place 1 drop into both eyes every evening.    [provider]  Multiple Vitamin (MULTIVITAMIN WITH MINERALS) TABS tablet Take 1 tablet by mouth daily. 02/22/20   Antonieta Pert, MD  Nutritional Supplements (FEEDING SUPPLEMENT, NEPRO CARB STEADY,) LIQD Take 237 mLs by mouth 2 (two) times daily between meals. 02/17/22   Barton Dubois, MD      Allergies    Lisinopril and Semaglutide    Review of Systems   Review of Systems  Constitutional:  Positive for chills and fever.  Respiratory:  Positive for cough.   All other systems reviewed and are negative.   Physical Exam Updated Vital Signs BP 124/67   Pulse 74   Temp 97.6 F (36.4 C) (Oral)   Resp 16   SpO2 95%  Physical Exam Vitals and nursing note reviewed.  Constitutional:      General: He is not in acute distress.    Appearance: He is well-developed.  HENT:     Head: Normocephalic and atraumatic.  Eyes:     Conjunctiva/sclera: Conjunctivae normal.  Cardiovascular:     Rate and Rhythm: Normal rate and regular rhythm.  Pulmonary:     Effort: Pulmonary effort  is normal. No respiratory distress.     Breath sounds: Normal breath sounds.  Abdominal:     Palpations: Abdomen is soft.     Tenderness: There is no abdominal tenderness.  Musculoskeletal:        General: No swelling.     Cervical back: Neck supple.  Skin:    General: Skin is warm and dry.     Capillary Refill: Capillary refill takes less than 2 seconds.  Neurological:     Mental Status: He is alert.  Psychiatric:        Mood and Affect: Mood normal.     ED Results / Procedures / Treatments   Labs (all labs ordered are listed, but only abnormal results are displayed) Labs Reviewed  RESP PANEL BY RT-PCR (RSV, FLU A&B, COVID)   RVPGX2 - Abnormal; Notable for the following components:      Result Value   Influenza A by PCR POSITIVE (*)    All other components within normal limits  CBC WITH DIFFERENTIAL/PLATELET - Abnormal; Notable for the following components:   RBC 3.72 (*)    Hemoglobin 11.1 (*)    HCT 33.5 (*)    Platelets 119 (*)    All other components within normal limits  BASIC METABOLIC PANEL - Abnormal; Notable for the following components:   Chloride 96 (*)    Glucose, Bld 62 (*)    Creatinine, Ser 5.53 (*)    Calcium 8.3 (*)    GFR, Estimated 10 (*)    All other components within normal limits  CBG MONITORING, ED - Abnormal; Notable for the following components:   Glucose-Capillary 49 (*)    All other components within normal limits  CBG MONITORING, ED    EKG None  Radiology DG Chest Portable 1 View  Result Date: 06/03/2022 CLINICAL DATA:  Influenza, cough EXAM: PORTABLE CHEST 1 VIEW COMPARISON:  02/13/2022 FINDINGS: 2 frontal views of the chest demonstrate and unremarkable cardiac silhouette. No acute airspace disease, effusion, or pneumothorax. No acute bony abnormalities. Right internal jugular catheter tip overlies superior vena cava. IMPRESSION: 1. No acute intrathoracic process. Electronically Signed   By: Randa Ngo M.D.   On: 06/03/2022 15:06    Procedures Procedures    Medications Ordered in ED Medications  acetaminophen (TYLENOL) tablet 650 mg (650 mg Oral Given 06/03/22 1318)  oseltamivir (TAMIFLU) capsule 30 mg (30 mg Oral Given 06/03/22 1523)    ED Course/ Medical Decision Making/ A&P Clinical Course as of 06/03/22 1729  Sun Jun 03, 2022  1418 Influenza A By PCR(!): POSITIVE [JL]  1628 Glucose(!): 62 [JL]    Clinical Course User Index [JL] Regan Lemming, MD                           Medical Decision Making Amount and/or Complexity of Data Reviewed Labs: ordered. Decision-making details documented in ED Course. Radiology: ordered.  Risk Prescription drug  management.     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  Patient last received dialysis yesterday.  He was febrile at dialysis.  He has had a cough with fever and chills at home.  He endorses some rhinorrhea.  He denies any chest pain or shortness of breath.  He endorses a mild headache.  Paul Bond is a 81 y.o. male who presents to the  ED with a 2 day history of fever, rhinorrhea, and nasal congestion.  On my exam, the patient is well-appearing and well-hydrated.  The patient's lungs are clear to auscultation bilaterally. Additionally, the patient has a soft/non-tender abdomen, clear tympanic membranes, and no oropharyngeal exudates.  There are no signs of meningismus.  I see no signs of an acute bacterial infection.  The patient's presentation is most consistent with a viral upper respiratory infection.  I have a low suspicion for pneumonia as the patient's cough has been non-productive and the patient is neither tachypneic nor hypoxic on room air.  Additionally, the patient is CTAB.  Laboratory testing significant for CBC without a leukocytosis, stable anemia to 11.1, BMP without significant electrolyte abnormality.  The patient was found to be mildly hypoglycemic to 62, administered oral juice, initially hypoglycemic on recheck subsequently improved to the 70s without aggressive intervention.  I discussed symptomatic management, including hydration, motrin, and tylenol. The patient felt safe being discharged from the ED.  They agreed to followup with the PCP if needed.  I provided ED return precautions.   Final Clinical Impression(s) / ED Diagnoses Final diagnoses:  Influenza A  Hypoglycemia    Rx / DC Orders ED Discharge Orders          Ordered    oseltamivir (TAMIFLU) 30 MG capsule  2 times daily        06/03/22 1623               Regan Lemming, MD 06/03/22 1730

## 2022-07-27 ENCOUNTER — Inpatient Hospital Stay: Payer: No Typology Code available for payment source | Admitting: Hematology and Oncology

## 2022-07-27 ENCOUNTER — Inpatient Hospital Stay: Payer: No Typology Code available for payment source | Attending: Physician Assistant

## 2022-07-27 DIAGNOSIS — N185 Chronic kidney disease, stage 5: Secondary | ICD-10-CM | POA: Insufficient documentation

## 2022-07-27 DIAGNOSIS — I129 Hypertensive chronic kidney disease with stage 1 through stage 4 chronic kidney disease, or unspecified chronic kidney disease: Secondary | ICD-10-CM | POA: Insufficient documentation

## 2022-07-27 DIAGNOSIS — D472 Monoclonal gammopathy: Secondary | ICD-10-CM | POA: Insufficient documentation

## 2022-07-27 DIAGNOSIS — R634 Abnormal weight loss: Secondary | ICD-10-CM | POA: Insufficient documentation

## 2022-07-27 DIAGNOSIS — Z87891 Personal history of nicotine dependence: Secondary | ICD-10-CM | POA: Insufficient documentation

## 2022-07-27 DIAGNOSIS — E1122 Type 2 diabetes mellitus with diabetic chronic kidney disease: Secondary | ICD-10-CM | POA: Insufficient documentation

## 2022-07-27 DIAGNOSIS — D631 Anemia in chronic kidney disease: Secondary | ICD-10-CM | POA: Insufficient documentation

## 2022-07-27 DIAGNOSIS — Z85048 Personal history of other malignant neoplasm of rectum, rectosigmoid junction, and anus: Secondary | ICD-10-CM | POA: Insufficient documentation

## 2022-08-02 ENCOUNTER — Other Ambulatory Visit: Payer: Self-pay | Admitting: Physician Assistant

## 2022-08-02 DIAGNOSIS — D649 Anemia, unspecified: Secondary | ICD-10-CM

## 2022-08-02 DIAGNOSIS — D472 Monoclonal gammopathy: Secondary | ICD-10-CM

## 2022-08-03 ENCOUNTER — Inpatient Hospital Stay (HOSPITAL_BASED_OUTPATIENT_CLINIC_OR_DEPARTMENT_OTHER): Payer: No Typology Code available for payment source | Admitting: Physician Assistant

## 2022-08-03 ENCOUNTER — Other Ambulatory Visit: Payer: Self-pay

## 2022-08-03 ENCOUNTER — Inpatient Hospital Stay: Payer: No Typology Code available for payment source

## 2022-08-03 ENCOUNTER — Inpatient Hospital Stay: Payer: No Typology Code available for payment source | Admitting: Physician Assistant

## 2022-08-03 VITALS — BP 100/54 | HR 98 | Temp 98.4°F | Resp 16 | Wt 126.0 lb

## 2022-08-03 DIAGNOSIS — Z87891 Personal history of nicotine dependence: Secondary | ICD-10-CM | POA: Diagnosis not present

## 2022-08-03 DIAGNOSIS — D472 Monoclonal gammopathy: Secondary | ICD-10-CM

## 2022-08-03 DIAGNOSIS — R634 Abnormal weight loss: Secondary | ICD-10-CM | POA: Diagnosis not present

## 2022-08-03 DIAGNOSIS — D649 Anemia, unspecified: Secondary | ICD-10-CM | POA: Diagnosis not present

## 2022-08-03 DIAGNOSIS — I129 Hypertensive chronic kidney disease with stage 1 through stage 4 chronic kidney disease, or unspecified chronic kidney disease: Secondary | ICD-10-CM | POA: Diagnosis not present

## 2022-08-03 DIAGNOSIS — N185 Chronic kidney disease, stage 5: Secondary | ICD-10-CM | POA: Diagnosis not present

## 2022-08-03 DIAGNOSIS — D631 Anemia in chronic kidney disease: Secondary | ICD-10-CM | POA: Diagnosis not present

## 2022-08-03 DIAGNOSIS — E1122 Type 2 diabetes mellitus with diabetic chronic kidney disease: Secondary | ICD-10-CM | POA: Diagnosis not present

## 2022-08-03 DIAGNOSIS — Z85048 Personal history of other malignant neoplasm of rectum, rectosigmoid junction, and anus: Secondary | ICD-10-CM | POA: Diagnosis not present

## 2022-08-03 LAB — CBC WITH DIFFERENTIAL (CANCER CENTER ONLY)
Abs Immature Granulocytes: 0.08 10*3/uL — ABNORMAL HIGH (ref 0.00–0.07)
Basophils Absolute: 0 10*3/uL (ref 0.0–0.1)
Basophils Relative: 0 %
Eosinophils Absolute: 0.4 10*3/uL (ref 0.0–0.5)
Eosinophils Relative: 3 %
HCT: 27 % — ABNORMAL LOW (ref 39.0–52.0)
Hemoglobin: 9 g/dL — ABNORMAL LOW (ref 13.0–17.0)
Immature Granulocytes: 1 %
Lymphocytes Relative: 12 %
Lymphs Abs: 1.4 10*3/uL (ref 0.7–4.0)
MCH: 31.1 pg (ref 26.0–34.0)
MCHC: 33.3 g/dL (ref 30.0–36.0)
MCV: 93.4 fL (ref 80.0–100.0)
Monocytes Absolute: 1.1 10*3/uL — ABNORMAL HIGH (ref 0.1–1.0)
Monocytes Relative: 9 %
Neutro Abs: 8.8 10*3/uL — ABNORMAL HIGH (ref 1.7–7.7)
Neutrophils Relative %: 75 %
Platelet Count: 239 10*3/uL (ref 150–400)
RBC: 2.89 MIL/uL — ABNORMAL LOW (ref 4.22–5.81)
RDW: 15.8 % — ABNORMAL HIGH (ref 11.5–15.5)
WBC Count: 11.7 10*3/uL — ABNORMAL HIGH (ref 4.0–10.5)
nRBC: 0 % (ref 0.0–0.2)

## 2022-08-03 LAB — CMP (CANCER CENTER ONLY)
ALT: 7 U/L (ref 0–44)
AST: 10 U/L — ABNORMAL LOW (ref 15–41)
Albumin: 3.7 g/dL (ref 3.5–5.0)
Alkaline Phosphatase: 53 U/L (ref 38–126)
Anion gap: 12 (ref 5–15)
BUN: 40 mg/dL — ABNORMAL HIGH (ref 8–23)
CO2: 32 mmol/L (ref 22–32)
Calcium: 9 mg/dL (ref 8.9–10.3)
Chloride: 91 mmol/L — ABNORMAL LOW (ref 98–111)
Creatinine: 4.79 mg/dL — ABNORMAL HIGH (ref 0.61–1.24)
GFR, Estimated: 12 mL/min — ABNORMAL LOW (ref >60)
Glucose, Bld: 151 mg/dL — ABNORMAL HIGH (ref 70–99)
Potassium: 4 mmol/L (ref 3.5–5.1)
Sodium: 135 mmol/L (ref 135–145)
Total Bilirubin: 0.6 mg/dL (ref 0.3–1.2)
Total Protein: 7.8 g/dL (ref 6.5–8.1)

## 2022-08-03 NOTE — Progress Notes (Signed)
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 Lorenso Bond  11/21/2021: bone marrow biopsy performed, inadequate sample 12/22/2021: bone marrow biopsy repeated, inadequate sample 12/28/2021: discussed results with Paul Bond. Patient opted not to undergo a 3rd bone marrow biopsy.   Interval History:  Paul Bond 82 y.o. male with medical history significant for an IgM monoclonal gammopathy and anemia of chronic disease who presents for a follow up visit at the request of his [place] nephrologist.   Paul Bond reports that he has lost 5-10 lbs 3 months ago and unable to gain the weight back. He reports his appetite is stable.and back to baseline. His energy levels are stable. He continues on dialysis three times a week. He denies any issues with bleeding or bruising. He is otherwise feeling well. He denies fevers, chills, sweats, shortness of breath, chest pain or cough. He has no other complaints. Rest of the 10 point ROS is below.    MEDICAL HISTORY:  Past Medical History:  Diagnosis Date   Chronic indwelling Foley catheter    Diabetes mellitus without complication ([place])    Hypertension    Rectal cancer ([place])    Stroke [facility])     SURGICAL HISTORY: Past Surgical History:  Procedure Laterality Date   BIOPSY  02/18/2020   Procedure: BIOPSY;  Surgeon: Paul Flock, MD;  Location: [facility] ENDOSCOPY;  Service: Gastroenterology;;   COLON SURGERY     COLONOSCOPY  02/18/2020   COLONOSCOPY WITH PROPOFOL N/A 02/18/2020   Procedure: COLONOSCOPY WITH PROPOFOL;  Surgeon: Paul Flock, MD;  Location: [facility] ENDOSCOPY;  Service: Gastroenterology;  Laterality: N/A;   CYSTOSCOPY W/ URETERAL STENT PLACEMENT Right 02/03/2020   Procedure:  CYSTOSCOPY WITH RETROGRADE PYELOGRAM/URETERAL DOUBLE J STENT PLACEMENT;  Surgeon: Paul Gallo, MD;  Location: [facility];  Service: Urology;  Laterality: Right;   ESOPHAGOGASTRODUODENOSCOPY (EGD) WITH PROPOFOL N/A 02/18/2020   Procedure: ESOPHAGOGASTRODUODENOSCOPY (EGD) WITH PROPOFOL;  Surgeon: Paul Flock, MD;  Location: [place];  Service: Gastroenterology;  Laterality: N/A;   IR FLUORO GUIDE CV LINE RIGHT  02/14/2022   IR US GUIDE VASC ACCESS RIGHT  02/14/2022   POLYPECTOMY  02/18/2020   Procedure: POLYPECTOMY;  Surgeon: Paul Flock, MD;  Location: [facility] ENDOSCOPY;  Service: Gastroenterology;;    SOCIAL HISTORY: Social History   Socioeconomic History   Marital status: Married    Spouse name: Not on file   Number of children: Not on file   Years of education: Not on file   Highest education level: Not on file  Occupational History   Occupation: retired  Tobacco Use   Smoking status: Former    Packs/day: 1.00    Years: 15.00    Total pack years: 15.00    Types: Cigarettes    Quit date: 1974    Years since quitting: 50.1   Smokeless tobacco: Never  Vaping Use   Vaping Use: Never used  Substance and Sexual Activity   Alcohol use: Not Currently    Comment: quit in 1974, binge drinking prior   Drug use: Never   Sexual activity: Not on file  Other Topics Concern   Not on file  Social History Narrative   Not on file   Social Determinants of Health   Financial Resource Strain: Not on file  Food Insecurity: No Food Insecurity (02/14/2022)   Hunger Vital Sign    Worried About Running Out of Food in the Last Year: Never true    Ran Out of Food in the Last Year: Never true  Transportation Needs: No Transportation Needs (02/14/2022)   PRAPARE - Hydrologist (Medical): No    Lack of Transportation (Non-Medical): No  Physical Activity: Not on file  Stress: Not on file  Social Connections: Not on file  Intimate Partner Violence: Not At  Risk (02/14/2022)   Humiliation, Afraid, Rape, and Kick questionnaire    Fear of Current or Ex-Partner: No    Emotionally Abused: No    Physically Abused: No    Sexually Abused: No    FAMILY HISTORY: No family history on file.  ALLERGIES:  is allergic to lisinopril and semaglutide.  MEDICATIONS:  Current Outpatient Medications  Medication Sig Dispense Refill   Calcium Carbonate Antacid (CALCIUM CARBONATE, DOSED IN MG ELEMENTAL CALCIUM,) 1250 MG/5ML SUSP Take 5 mLs (500 mg of elemental calcium total) by mouth every 6 (six) hours as needed for indigestion. 473 mL 1   Cholecalciferol (VITAMIN D3) 50 MCG (2000 UT) TABS Take 2,000 Units by mouth daily.     diltiazem (TIAZAC) 240 MG 24 hr capsule Take 240 mg by mouth daily.     glucose 4 GM chewable tablet Chew 4 tablets by mouth daily as needed for low blood sugar.  for low blood sugar, repeat every 15 minutes if blood sugar is less than 70     hydrALAZINE (APRESOLINE) 100 MG tablet Take 100 mg by mouth 3 (three) times daily.     insulin aspart (NOVOLOG) 100 UNIT/ML injection Inject 0-6 Units into the skin 3 (three) times daily with meals. 10 mL 11   insulin glargine-yfgn (SEMGLEE) 100 UNIT/ML injection Inject 8 Units into the skin daily.     iron polysaccharides (NIFEREX) 150 MG capsule Take 1 capsule (150 mg total) by mouth daily. 30 capsule 2   latanoprost (XALATAN) 0.005 % ophthalmic solution Place 1 drop into both eyes every evening.     Multiple Vitamin (MULTIVITAMIN WITH MINERALS) TABS tablet Take 1 tablet by mouth daily.     Nutritional Supplements (FEEDING SUPPLEMENT, NEPRO CARB STEADY,) LIQD Take 237 mLs by mouth 2 (two) times daily between meals.  0   UNABLE TO FIND daily. Med Name: Paul Bond   food supplement     No current facility-administered medications for this visit.    REVIEW OF SYSTEMS:   Constitutional: ( - ) fevers, ( - )  chills , ( - ) night sweats Eyes: ( - ) blurriness of vision, ( - ) double vision, (  - ) watery eyes Ears, nose, mouth, throat, and face: ( - ) mucositis, ( - ) sore throat Respiratory: ( - ) cough, ( - ) dyspnea, ( - ) wheezes Cardiovascular: ( - ) palpitation, ( - ) chest discomfort, ( - ) lower extremity swelling Gastrointestinal:  ( - ) nausea, ( - ) heartburn, ( - ) change in bowel habits Skin: ( - ) abnormal skin rashes Lymphatics: ( - ) new lymphadenopathy, ( - ) easy bruising Neurological: ( - ) numbness, ( - ) tingling, ( - ) new weaknesses Behavioral/Psych: ( - ) mood change, ( - ) new changes  All other systems were reviewed with the patient and are negative.  PHYSICAL EXAMINATION:  Vitals:   08/03/22 0841  BP: (!) 100/54  Pulse: 98  Resp: 16  Temp: 98.4 F (36.9 C)  SpO2: 100%   Filed Weights   08/03/22 0841  Weight: 126 lb (57.2 kg)    GENERAL: Well-appearing elderly African-American male alert, no distress and comfortable SKIN: skin color, texture, turgor are normal, no rashes or significant lesions EYES: conjunctiva are pink and non-injected, sclera clear LUNGS: clear to auscultation and percussion with normal breathing effort HEART: regular rate & rhythm and no murmurs and no lower extremity edema Musculoskeletal: no cyanosis of digits and no clubbing  PSYCH: alert & oriented x 3, fluent speech NEURO: no focal motor/sensory deficits  LABORATORY DATA:  I have reviewed the data as listed    Latest Ref Rng & Units 08/03/2022    8:17 AM 06/03/2022    3:09 PM 02/15/2022    5:08 PM  CBC  WBC 4.0 - 10.5 K/uL 11.7  7.6    Hemoglobin 13.0 - 17.0 g/dL 9.0  11.1  9.1   Hematocrit 39.0 - 52.0 % 27.0  33.5  27.5   Platelets 150 - 400 K/uL 239  119         Latest Ref Rng & Units 08/03/2022    8:17 AM 06/03/2022    3:09 PM 02/15/2022    7:55 AM  CMP  Glucose 70 - 99 mg/dL 151  62  149   BUN 8 - 23 mg/dL 40  21  56   Creatinine 0.61 - 1.24 mg/dL 4.79  5.53  6.17   Sodium 135 - 145 mmol/L 135  136  138   Potassium 3.5 - 5.1 mmol/L 4.0  4.0  3.3    Chloride 98 - 111 mmol/L 91  96  104   CO2 22 - 32 mmol/L 32  26  25   Calcium 8.9 - 10.3 mg/dL 9.0  8.3  8.1   Total Protein 6.5 - 8.1 g/dL 7.8     Total Bilirubin 0.3 - 1.2 mg/dL 0.6     Alkaline Phos 38 - 126 U/L 53     AST 15 - 41 U/L 10     ALT 0 - 44 U/L 7       Lab Results  Component Value Date   MPROTEIN 0.4 (H) 10/11/2021   Lab Results  Component Value Date   KPAFRELGTCHN 125.0 (H) 12/21/2021   LAMBDASER 69.2 (H) 12/21/2021   KAPLAMBRATIO 4.48 12/28/2021   KAPLAMBRATIO 1.81 (H) 12/21/2021    RADIOGRAPHIC STUDIES: No results found.  ASSESSMENT & PLAN Paul Bond is a 82 y.o. male with medical history significant for an IgM monoclonal gammopathy and anemia of chronic disease who presents for a follow up visit.   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 Patient does not wish to pursue a third bone marrow biopsy. --At this time we have low clinical suspicion for multiple myeloma. --Okay to proceed with erythropoietin shots per the [place]. --Bone survey from 12/28/2021 showed no evidence of lytic lesions.  --Labs today show anemia with Hgb 9.0. Creatinine is 4.79 in the setting of Stage V CKD.  Patient is on HD 3x/week. SPEP/IFE and sFLC pending --If there is signs of transformation to multiple myeloma based on today's labs, we will discuss if repeat bone marrow biopsy is needed.  --RTC based on today's labs.   #Weight loss: --Patient did undergo CT imaging on 07/05/2022 to rule out recurrence of rectal cancer. There is mild  prominence of the main pancreatic duct with punctate hypodensities scattered throughout. Additionally, increased edema along the presacral space appears increased from prior study.  --She has a follow up with [facility] gastro[facility]ology to evaluate the CT findings.   No orders of the defined types were placed in this encounter.   All questions were answered. The patient knows to  call the clinic with any problems, questions or concerns.  A total of more than 30 minutes were spent on this encounter with face-to-face time and non-face-to-face time, including preparing to see the patient, ordering tests and/or medications, counseling the patient and coordination of care as outlined above.   Dede Query PA-C Dept of Hematology and [place] at [facility] Phone: [phone]   08/03/2022 3:07 PM

## 2022-08-06 LAB — KAPPA/LAMBDA LIGHT CHAINS
Kappa free light chain: 217.6 mg/L — ABNORMAL HIGH (ref 3.3–19.4)
Kappa, lambda light chain ratio: 1.49 (ref 0.26–1.65)
Lambda free light chains: 146 mg/L — ABNORMAL HIGH (ref 5.7–26.3)

## 2022-08-09 LAB — MULTIPLE MYELOMA PANEL, SERUM
Albumin SerPl Elph-Mcnc: 3.3 g/dL (ref 2.9–4.4)
Albumin/Glob SerPl: 0.9 (ref 0.7–1.7)
Alpha 1: 0.3 g/dL (ref 0.0–0.4)
Alpha2 Glob SerPl Elph-Mcnc: 1.1 g/dL — ABNORMAL HIGH (ref 0.4–1.0)
B-Globulin SerPl Elph-Mcnc: 0.9 g/dL (ref 0.7–1.3)
Gamma Glob SerPl Elph-Mcnc: 1.5 g/dL (ref 0.4–1.8)
Globulin, Total: 3.8 g/dL (ref 2.2–3.9)
IgA: 416 mg/dL (ref 61–437)
IgG (Immunoglobin G), Serum: 1212 mg/dL (ref 603–1613)
IgM (Immunoglobulin M), Srm: 379 mg/dL — ABNORMAL HIGH (ref 15–143)
M Protein SerPl Elph-Mcnc: 0.3 g/dL — ABNORMAL HIGH
Total Protein ELP: 7.1 g/dL (ref 6.0–8.5)

## 2022-08-27 ENCOUNTER — Telehealth: Payer: Self-pay

## 2022-08-27 NOTE — Telephone Encounter (Signed)
Adom Schoeneck: please notify patient that labs are stable and no significant changes in protein levels. plan to continue to monitor  f/u with Dr. Lorenso Courier in 6 months  IT  Pt advised with VU

## 2022-10-30 ENCOUNTER — Other Ambulatory Visit: Payer: Self-pay | Admitting: *Deleted

## 2022-10-30 DIAGNOSIS — N186 End stage renal disease: Secondary | ICD-10-CM

## 2022-11-09 ENCOUNTER — Ambulatory Visit (INDEPENDENT_AMBULATORY_CARE_PROVIDER_SITE_OTHER)
Admission: RE | Admit: 2022-11-09 | Discharge: 2022-11-09 | Disposition: A | Discharge status: Home / Self Care | Payer: Medicare Other | Source: Ambulatory Visit | Attending: Vascular Surgery | Admitting: Vascular Surgery

## 2022-11-09 ENCOUNTER — Ambulatory Visit (HOSPITAL_COMMUNITY)
Admission: RE | Admit: 2022-11-09 | Discharge: 2022-11-09 | Disposition: A | Discharge status: Home / Self Care | Payer: Medicare Other | Source: Ambulatory Visit | Attending: Vascular Surgery | Admitting: Vascular Surgery

## 2022-11-09 ENCOUNTER — Ambulatory Visit (INDEPENDENT_AMBULATORY_CARE_PROVIDER_SITE_OTHER): Payer: No Typology Code available for payment source | Admitting: Vascular Surgery

## 2022-11-09 ENCOUNTER — Encounter: Payer: Self-pay | Admitting: Vascular Surgery

## 2022-11-09 VITALS — BP 112/60 | HR 84 | Temp 98.0°F | Resp 20 | Ht 67.0 in | Wt 126.0 lb

## 2022-11-09 DIAGNOSIS — N186 End stage renal disease: Secondary | ICD-10-CM

## 2022-11-09 NOTE — Progress Notes (Signed)
Office Note     CC:  ESRD Requesting Provider:  Antonieta Pert, MD  HPI: Paul Bond is a Right handed 82 y.o. (July 21, 1940) male with kidney disease who presents at the request of Antonieta Pert, MD for permanent HD access. The patient has had one prior access procedure-left forearm graft. Per pt, previous tunneled lines have been placed in right IJ. Current access is right IJ. Dialysis days are Tuesday Thursday Saturday at the [place].   On exam, Paul Bond was doing well, accompanied by his daughter.  Originally from [facility] he served in [place].  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  Prior surgeries: Left forearm loop graft   Past Medical History:  Diagnosis Date   Chronic indwelling Foley catheter    Chronic kidney disease    Diabetes mellitus without complication (HCC)    Hypertension    Rectal cancer (HCC)    Stroke [facility])     Past Surgical History:  Procedure Laterality Date   BIOPSY  02/18/2020   Procedure: BIOPSY;  Surgeon: Benancio Deeds, MD;  Location: [facility] ENDOSCOPY;  Service: Gastroenterology;;   COLON SURGERY     COLONOSCOPY  02/18/2020   COLONOSCOPY WITH PROPOFOL N/A 02/18/2020   Procedure: COLONOSCOPY WITH PROPOFOL;  Surgeon: Benancio Deeds, MD;  Location: [facility] ENDOSCOPY;  Service: Gastroenterology;  Laterality: N/A;   CYSTOSCOPY W/ URETERAL STENT PLACEMENT Right 02/03/2020   Procedure: CYSTOSCOPY WITH RETROGRADE PYELOGRAM/URETERAL DOUBLE J STENT PLACEMENT;  Surgeon: Marcine Matar, MD;  Location: [facility] OR;  Service: Urology;  Laterality: Right;   ESOPHAGOGASTRODUODENOSCOPY (EGD) WITH PROPOFOL N/A 02/18/2020   Procedure: ESOPHAGOGASTRODUODENOSCOPY (EGD) WITH PROPOFOL;  Surgeon: Benancio Deeds, MD;  Location: [facility] ENDOSCOPY;  Service: Gastroenterology;  Laterality: N/A;   IR FLUORO GUIDE CV LINE RIGHT  02/14/2022   IR US GUIDE VASC ACCESS RIGHT  02/14/2022   POLYPECTOMY  02/18/2020   Procedure: POLYPECTOMY;   Surgeon: Benancio Deeds, MD;  Location: [facility] ENDOSCOPY;  Service: Gastroenterology;;    Social History   Socioeconomic History   Marital status: Married    Spouse name: Not on file   Number of children: Not on file   Years of education: Not on file   Highest education level: Not on file  Occupational History   Occupation: retired  Tobacco Use   Smoking status: Former    Packs/day: 1.00    Years: 15.00    Additional pack years: 0.00    Total pack years: 15.00    Types: Cigarettes    Quit date: 1974    Years since quitting: 50.4   Smokeless tobacco: Never  Vaping Use   Vaping Use: Never used  Substance and Sexual Activity   Alcohol use: Not Currently    Comment: quit in 1974, binge drinking prior   Drug use: Never   Sexual activity: Not on file  Other Topics Concern   Not on file  Social History Narrative   Not on file   Social Determinants of Health   Financial Resource Strain: Not on file  Food Insecurity: No Food Insecurity (02/14/2022)   Hunger Vital Sign    Worried About Running Out of Food in the Last Year: Never true    Ran Out of Food in the Last Year: Never true  Transportation Needs: No Transportation Needs (02/14/2022)   PRAPARE - Administrator, Civil Service (Medical): No    Lack of Transportation (Non-Medical): No  Physical Activity: Not on file  Stress: Not on file  Social Connections: Not on file  Intimate Partner Violence: Not At Risk (02/14/2022)   Humiliation, Afraid, Rape, and Kick questionnaire    Fear of Current or Ex-Partner: No    Emotionally Abused: No    Physically Abused: No    Sexually Abused: No   ***History reviewed. No pertinent family history.  Current Outpatient Medications  Medication Sig Dispense Refill   Calcium Carbonate Antacid (CALCIUM CARBONATE, DOSED IN MG ELEMENTAL CALCIUM,) 1250 MG/5ML SUSP Take 5 mLs (500 mg of elemental calcium total) by mouth every 6 (six) hours as needed for indigestion. 473 mL 1    Cholecalciferol (VITAMIN D3) 50 MCG (2000 UT) TABS Take 2,000 Units by mouth daily.     diltiazem (TIAZAC) 240 MG 24 hr capsule Take 240 mg by mouth daily.     glucose 4 GM chewable tablet Chew 4 tablets by mouth daily as needed for low blood sugar.  for low blood sugar, repeat every 15 minutes if blood sugar is less than 70     hydrALAZINE (APRESOLINE) 100 MG tablet Take 100 mg by mouth 3 (three) times daily.     insulin aspart (NOVOLOG) 100 UNIT/ML injection Inject 0-6 Units into the skin 3 (three) times daily with meals. 10 mL 11   insulin glargine-yfgn (SEMGLEE) 100 UNIT/ML injection Inject 8 Units into the skin daily.     iron polysaccharides (NIFEREX) 150 MG capsule Take 1 capsule (150 mg total) by mouth daily. 30 capsule 2   latanoprost (XALATAN) 0.005 % ophthalmic solution Place 1 drop into both eyes every evening.     Multiple Vitamin (MULTIVITAMIN WITH MINERALS) TABS tablet Take 1 tablet by mouth daily.     Nutritional Supplements (FEEDING SUPPLEMENT, NEPRO CARB STEADY,) LIQD Take 237 mLs by mouth 2 (two) times daily between meals.  0   UNABLE TO FIND daily. Med Name: Maree Erie   food supplement     No current facility-administered medications for this visit.    Allergies  Allergen Reactions   Lisinopril Cough   Semaglutide Other (See Comments)    indigestion     REVIEW OF SYSTEMS:  *** [X]  denotes positive finding, [ ]  denotes negative finding Cardiac  Comments:  Chest pain or chest pressure:    Shortness of breath upon exertion:    Short of breath when lying flat:    Irregular heart rhythm:        Vascular    Pain in calf, thigh, or hip brought on by ambulation:    Pain in feet at night that wakes you up from your sleep:     Blood clot in your veins:    Leg swelling:         Pulmonary    Oxygen at home:    Productive cough:     Wheezing:         Neurologic    Sudden weakness in arms or legs:     Sudden numbness in arms or legs:     Sudden onset of  difficulty speaking or slurred speech:    Temporary loss of vision in one eye:     Problems with dizziness:         Gastrointestinal    Blood in stool:     Vomited blood:         Genitourinary    Burning when urinating:     Blood in urine:  Psychiatric    Major depression:         Hematologic    Bleeding problems:    Problems with blood clotting too easily:        Skin    Rashes or ulcers:        Constitutional    Fever or chills:      PHYSICAL EXAMINATION:  Vitals:   11/09/22 1449  BP: 112/60  Pulse: 84  Resp: 20  Temp: 98 F (36.7 C)  SpO2: 97%  Weight: 126 lb (57.2 kg)  Height: 5\' 7"  (1.702 m)    General:  WDWN in NAD; vital signs documented above Gait: Not observed HENT: WNL, normocephalic Pulmonary: normal non-labored breathing , without Rales, rhonchi,  wheezing Cardiac: {Desc; regular/irreg:14544} HR, without  Murmurs {With/Without:20273} carotid bruit*** Abdomen: soft, NT, no masses Skin: {With/Without:20273} rashes Vascular Exam/Pulses:  Right Left  Radial {Exam; arterial pulse strength 0-4:30167} {Exam; arterial pulse strength 0-4:30167}  Ulnar {Exam; arterial pulse strength 0-4:30167} {Exam; arterial pulse strength 0-4:30167}  Femoral {Exam; arterial pulse strength 0-4:30167} {Exam; arterial pulse strength 0-4:30167}  Popliteal {Exam; arterial pulse strength 0-4:30167} {Exam; arterial pulse strength 0-4:30167}  DP {Exam; arterial pulse strength 0-4:30167} {Exam; arterial pulse strength 0-4:30167}  PT {Exam; arterial pulse strength 0-4:30167} {Exam; arterial pulse strength 0-4:30167}   Extremities: {With/Without:20273} ischemic changes, {With/Without:20273} Gangrene , {With/Without:20273} cellulitis; {With/Without:20273} open wounds;  Musculoskeletal: no muscle wasting or atrophy  Neurologic: A&O X 3;  No focal weakness or paresthesias are detected Psychiatric:  The pt has {Desc; normal/abnormal:11317::"Normal"} affect.   Non-Invasive  Vascular Imaging:   ***    ASSESSMENT/PLAN:  AZARYAH WALLAR is a 82 y.o. male who presents with end stage renal disease  Based on vein mapping and examination, Behr is a candidate for left arm brachiobasilic fistula creation.. I had an extensive discussion with this patient in regards to the nature of access surgery, including risk, benefits, and alternatives.   The patient is aware that the risks of access surgery include but are not limited to: bleeding, infection, steal syndrome, nerve damage, ischemic monomelic neuropathy, failure of access to mature, complications related to venous hypertension, and possible need for additional access procedures in the future.  I discussed with the patient the nature of the staged access procedure, specifically the need for a second operation to transpose the first stage fistula if it matures adequately.   The patient has agreed to proceed with the above procedure which will be scheduled at his convenience.  Paul Sparrow, MD [facility] [phone]

## 2022-11-13 ENCOUNTER — Telehealth: Payer: Self-pay

## 2022-11-13 NOTE — Telephone Encounter (Signed)
Attempted to reach patient/daughter Brynda Peon to schedule left arm AVF/AVG with Dr. Karin Lieu, no answer. Left VM to return call.

## 2022-11-16 ENCOUNTER — Other Ambulatory Visit: Payer: Self-pay

## 2022-11-16 DIAGNOSIS — N186 End stage renal disease: Secondary | ICD-10-CM

## 2022-11-16 NOTE — Telephone Encounter (Signed)
Spoke with patient's daughter Wyatt Mage. Scheduled surgery for 6/24 with instructions provided. She voiced understanding.

## 2022-11-30 ENCOUNTER — Encounter (HOSPITAL_COMMUNITY): Payer: Self-pay | Admitting: Vascular Surgery

## 2022-11-30 NOTE — Progress Notes (Signed)
SDW call  Patient was given pre-op instructions over the phone. Patient verbalized understanding of instructions provided.    [facility] [place] [facility] Cardiologist -  Pulmonary:    PPM/ICD - denies  Chest x-ray - 02/13/2022 EKG -  02/13/2022 Stress Test - ECHO - 02/03/2020 Cardiac Cath -   Sleep Study/sleep apnea/CPAP: denies  Type II diabetic Fasting Blood sugar range: 130-180 How often check sugars: daily Insulin aspart (Novolog), instructed to take zero units day of surgery. If BS>220 then use 1/2 the correction dose Insulin glargine (Semglee) instructed to use 4 units (50% of regular dose) the evening before surgery   Blood Thinner Instructions: deneis Aspirin Instructions:denies   ERAS Protcol - No, NPO PRE-SURGERY Ensure or G2-    COVID TEST- n/a    Anesthesia review: Yes. DM, HTN, Stroke, ESRD   Patient denies shortness of breath, fever, cough and chest pain over the phone call  Your procedure is scheduled on Monday December 03, 2022  Report to [facility] Main Entrance "A" at 0900  A.M., then check in with the Admitting office.  Call this number if you have problems the morning of surgery:  [phone]   If you have any questions prior to your surgery date call [phone]: Open Monday-Friday 8am-4pm If you experience any cold or flu symptoms such as cough, fever, chills, shortness of breath, etc. between now and your scheduled surgery, please notify us at the above number    Remember:  Do not eat or drink after midnight the night before your surgery  Take these medicines the morning of surgery with A SIP OF WATER:  Diltiazem, Hydralazine  As of today, STOP taking any Aspirin (unless otherwise instructed by your surgeon) Aleve, Naproxen, Ibuprofen, Motrin, Advil, Goody's, BC's, all herbal medications, fish oil, and all vitamins.

## 2022-12-03 ENCOUNTER — Encounter (HOSPITAL_COMMUNITY): Payer: Self-pay | Admitting: Vascular Surgery

## 2022-12-03 ENCOUNTER — Encounter (HOSPITAL_COMMUNITY)
Admission: RE | Disposition: A | Discharge status: Home / Self Care | Payer: Self-pay | Source: Home / Self Care | Attending: Vascular Surgery

## 2022-12-03 ENCOUNTER — Ambulatory Visit (HOSPITAL_COMMUNITY)
Admission: RE | Admit: 2022-12-03 | Discharge: 2022-12-03 | Disposition: A | Discharge status: Home / Self Care | Payer: No Typology Code available for payment source | Attending: Vascular Surgery | Admitting: Vascular Surgery

## 2022-12-03 ENCOUNTER — Ambulatory Visit (HOSPITAL_COMMUNITY): Payer: No Typology Code available for payment source | Admitting: Physician Assistant

## 2022-12-03 ENCOUNTER — Ambulatory Visit (HOSPITAL_BASED_OUTPATIENT_CLINIC_OR_DEPARTMENT_OTHER): Payer: No Typology Code available for payment source | Admitting: Physician Assistant

## 2022-12-03 ENCOUNTER — Other Ambulatory Visit: Payer: Self-pay

## 2022-12-03 DIAGNOSIS — Z8673 Personal history of transient ischemic attack (TIA), and cerebral infarction without residual deficits: Secondary | ICD-10-CM | POA: Diagnosis not present

## 2022-12-03 DIAGNOSIS — Z87891 Personal history of nicotine dependence: Secondary | ICD-10-CM | POA: Insufficient documentation

## 2022-12-03 DIAGNOSIS — E1122 Type 2 diabetes mellitus with diabetic chronic kidney disease: Secondary | ICD-10-CM | POA: Diagnosis not present

## 2022-12-03 DIAGNOSIS — I120 Hypertensive chronic kidney disease with stage 5 chronic kidney disease or end stage renal disease: Secondary | ICD-10-CM | POA: Diagnosis present

## 2022-12-03 DIAGNOSIS — Z794 Long term (current) use of insulin: Secondary | ICD-10-CM

## 2022-12-03 DIAGNOSIS — Z992 Dependence on renal dialysis: Secondary | ICD-10-CM | POA: Diagnosis not present

## 2022-12-03 DIAGNOSIS — Z09 Encounter for follow-up examination after completed treatment for conditions other than malignant neoplasm: Secondary | ICD-10-CM | POA: Insufficient documentation

## 2022-12-03 DIAGNOSIS — N186 End stage renal disease: Secondary | ICD-10-CM | POA: Insufficient documentation

## 2022-12-03 DIAGNOSIS — N185 Chronic kidney disease, stage 5: Secondary | ICD-10-CM | POA: Diagnosis not present

## 2022-12-03 HISTORY — PX: AV FISTULA PLACEMENT: SHX1204

## 2022-12-03 LAB — POCT I-STAT, CHEM 8
BUN: 65 mg/dL — ABNORMAL HIGH (ref 8–23)
Calcium, Ion: 1.03 mmol/L — ABNORMAL LOW (ref 1.15–1.40)
Chloride: 96 mmol/L — ABNORMAL LOW (ref 98–111)
Creatinine, Ser: 7.7 mg/dL — ABNORMAL HIGH (ref 0.61–1.24)
Glucose, Bld: 152 mg/dL — ABNORMAL HIGH (ref 70–99)
HCT: 31 % — ABNORMAL LOW (ref 39.0–52.0)
Hemoglobin: 10.5 g/dL — ABNORMAL LOW (ref 13.0–17.0)
Potassium: 4.6 mmol/L (ref 3.5–5.1)
Sodium: 132 mmol/L — ABNORMAL LOW (ref 135–145)
TCO2: 31 mmol/L (ref 22–32)

## 2022-12-03 LAB — GLUCOSE, CAPILLARY
Glucose-Capillary: 141 mg/dL — ABNORMAL HIGH (ref 70–99)
Glucose-Capillary: 129 mg/dL — ABNORMAL HIGH (ref 70–99)
Glucose-Capillary: 124 mg/dL — ABNORMAL HIGH (ref 70–99)
Glucose-Capillary: 98 mg/dL (ref 70–99)
Glucose-Capillary: 123 mg/dL — ABNORMAL HIGH (ref 70–99)

## 2022-12-03 SURGERY — ARTERIOVENOUS (AV) FISTULA CREATION
Anesthesia: General | Site: Arm Upper | Laterality: Left

## 2022-12-03 MED ORDER — LIDOCAINE 2% (20 MG/ML) 5 ML SYRINGE
INTRAMUSCULAR | Status: AC
  Filled 2022-12-03: qty 5

## 2022-12-03 MED ORDER — PROPOFOL 10 MG/ML IV BOLUS
INTRAVENOUS | Status: AC
  Filled 2022-12-03: qty 20

## 2022-12-03 MED ORDER — CHLORHEXIDINE GLUCONATE 0.12 % MT SOLN
OROMUCOSAL | Status: AC
  Administered 2022-12-03: 15 mL via OROMUCOSAL
  Filled 2022-12-03: qty 15

## 2022-12-03 MED ORDER — CEFAZOLIN SODIUM-DEXTROSE 2-4 GM/100ML-% IV SOLN
INTRAVENOUS | Status: AC
  Filled 2022-12-03: qty 100

## 2022-12-03 MED ORDER — CEFAZOLIN SODIUM-DEXTROSE 2-4 GM/100ML-% IV SOLN
2.0000 g | INTRAVENOUS | Status: AC
  Administered 2022-12-03: 2 g via INTRAVENOUS

## 2022-12-03 MED ORDER — LIDOCAINE-EPINEPHRINE (PF) 1 %-1:200000 IJ SOLN
INTRAMUSCULAR | Status: DC | PRN
  Administered 2022-12-03: 3 mL via INTRADERMAL

## 2022-12-03 MED ORDER — PROPOFOL 10 MG/ML IV BOLUS
INTRAVENOUS | Status: DC | PRN
  Administered 2022-12-03: 100 mg via INTRAVENOUS

## 2022-12-03 MED ORDER — EPHEDRINE 5 MG/ML INJ
INTRAVENOUS | Status: AC
  Filled 2022-12-03: qty 5

## 2022-12-03 MED ORDER — LIDOCAINE HCL (PF) 1 % IJ SOLN
INTRAMUSCULAR | Status: AC
  Filled 2022-12-03: qty 30

## 2022-12-03 MED ORDER — ORAL CARE MOUTH RINSE: 15.0000 mL | Freq: Once | OROMUCOSAL | Status: AC

## 2022-12-03 MED ORDER — CHLORHEXIDINE GLUCONATE 4 % EX SOLN: 60.0000 mL | Freq: Once | CUTANEOUS | Status: DC

## 2022-12-03 MED ORDER — EPHEDRINE SULFATE-NACL 50-0.9 MG/10ML-% IV SOSY
PREFILLED_SYRINGE | INTRAVENOUS | Status: DC | PRN
  Administered 2022-12-03: 10 mg via INTRAVENOUS

## 2022-12-03 MED ORDER — DEXAMETHASONE SODIUM PHOSPHATE 10 MG/ML IJ SOLN
INTRAMUSCULAR | Status: DC | PRN
  Administered 2022-12-03: 5 mg via INTRAVENOUS

## 2022-12-03 MED ORDER — PHENYLEPHRINE 80 MCG/ML (10ML) SYRINGE FOR IV PUSH (FOR BLOOD PRESSURE SUPPORT)
PREFILLED_SYRINGE | INTRAVENOUS | Status: AC
  Filled 2022-12-03: qty 10

## 2022-12-03 MED ORDER — PHENYLEPHRINE HCL-NACL 20-0.9 MG/250ML-% IV SOLN
INTRAVENOUS | Status: DC | PRN
  Administered 2022-12-03: 40 ug/min via INTRAVENOUS

## 2022-12-03 MED ORDER — LIDOCAINE 2% (20 MG/ML) 5 ML SYRINGE
INTRAMUSCULAR | Status: DC | PRN
  Administered 2022-12-03: 40 mg via INTRAVENOUS

## 2022-12-03 MED ORDER — ONDANSETRON HCL 4 MG/2ML IJ SOLN
INTRAMUSCULAR | Status: DC | PRN
  Administered 2022-12-03: 4 mg via INTRAVENOUS

## 2022-12-03 MED ORDER — DEXMEDETOMIDINE HCL IN NACL 80 MCG/20ML IV SOLN
INTRAVENOUS | Status: AC
  Filled 2022-12-03: qty 20

## 2022-12-03 MED ORDER — HEPARIN 6000 UNIT IRRIGATION SOLUTION
Status: DC | PRN
  Administered 2022-12-03: 1

## 2022-12-03 MED ORDER — INSULIN ASPART 100 UNIT/ML IJ SOLN: 0.0000 [IU] | INTRAMUSCULAR | Status: DC | PRN

## 2022-12-03 MED ORDER — PHENYLEPHRINE 80 MCG/ML (10ML) SYRINGE FOR IV PUSH (FOR BLOOD PRESSURE SUPPORT)
PREFILLED_SYRINGE | INTRAVENOUS | Status: DC | PRN
  Administered 2022-12-03: 160 ug via INTRAVENOUS

## 2022-12-03 MED ORDER — SODIUM CHLORIDE 0.9 % IV SOLN: INTRAVENOUS | Status: DC

## 2022-12-03 MED ORDER — HEPARIN 6000 UNIT IRRIGATION SOLUTION
Status: AC
  Filled 2022-12-03: qty 500

## 2022-12-03 MED ORDER — 0.9 % SODIUM CHLORIDE (POUR BTL) OPTIME
TOPICAL | Status: DC | PRN
  Administered 2022-12-03: 1000 mL

## 2022-12-03 MED ORDER — CHLORHEXIDINE GLUCONATE 0.12 % MT SOLN: 15.0000 mL | Freq: Once | OROMUCOSAL | Status: AC

## 2022-12-03 MED ORDER — LIDOCAINE-EPINEPHRINE (PF) 1 %-1:200000 IJ SOLN
INTRAMUSCULAR | Status: AC
  Filled 2022-12-03: qty 30

## 2022-12-03 MED ORDER — TRAMADOL HCL 50 MG PO TABS: 50.0000 mg | ORAL_TABLET | Freq: Four times a day (QID) | ORAL | 0 refills | Status: DC | PRN

## 2022-12-03 MED ORDER — FENTANYL CITRATE (PF) 250 MCG/5ML IJ SOLN
INTRAMUSCULAR | Status: DC | PRN
  Administered 2022-12-03 (×2): 25 ug via INTRAVENOUS

## 2022-12-03 MED ORDER — FENTANYL CITRATE (PF) 250 MCG/5ML IJ SOLN
INTRAMUSCULAR | Status: AC
  Filled 2022-12-03: qty 5

## 2022-12-03 SURGICAL SUPPLY — 39 items
ADH SKN CLS APL DERMABOND .7 (GAUZE/BANDAGES/DRESSINGS) ×1
ADH SKN CLS LQ APL DERMABOND (GAUZE/BANDAGES/DRESSINGS) ×1
ADH SKNCLS APL OCTYL .7 VIOL (GAUZE/BANDAGES/DRESSINGS) ×1
ADH SKNCLS LQ APL MCBL BRR (GAUZE/BANDAGES/DRESSINGS) ×1
ARMBAND PINK RESTRICT EXTREMIT (MISCELLANEOUS) ×1 IMPLANT
BAG COUNTER SPONGE SURGICOUNT (BAG) ×1 IMPLANT
BAG SPNG CNTER NS LX DISP (BAG) ×1
BLADE CLIPPER SURG (BLADE) ×1 IMPLANT
BNDG ELASTIC 4X5.8 VLCR STR LF (GAUZE/BANDAGES/DRESSINGS) ×1 IMPLANT
CANISTER SUCT 3000ML PPV (MISCELLANEOUS) ×1 IMPLANT
CLIP TI MEDIUM 6 (CLIP) ×2 IMPLANT
CLIP TI WIDE RED SMALL 6 (CLIP) ×1 IMPLANT
COVER PROBE W GEL 5X96 (DRAPES) ×1 IMPLANT
DERMABOND ADVANCED .7 DNX12 (GAUZE/BANDAGES/DRESSINGS) ×1 IMPLANT
DERMABOND ADVANCED .7 DNX6 (GAUZE/BANDAGES/DRESSINGS) IMPLANT
ELECT REM PT RETURN 9FT ADLT (ELECTROSURGICAL) ×1
ELECTRODE REM PT RTRN 9FT ADLT (ELECTROSURGICAL) ×1 IMPLANT
GLOVE BIOGEL PI IND STRL 8 (GLOVE) ×1 IMPLANT
GOWN STRL REUS W/ TWL LRG LVL3 (GOWN DISPOSABLE) ×2 IMPLANT
GOWN STRL REUS W/TWL 2XL LVL3 (GOWN DISPOSABLE) ×2 IMPLANT
GOWN STRL REUS W/TWL LRG LVL3 (GOWN DISPOSABLE) ×2
KIT BASIN OR (CUSTOM PROCEDURE TRAY) ×1 IMPLANT
KIT TURNOVER KIT B (KITS) ×1 IMPLANT
NS IRRIG 1000ML POUR BTL (IV SOLUTION) ×1 IMPLANT
PACK CV ACCESS (CUSTOM PROCEDURE TRAY) ×1 IMPLANT
PAD ARMBOARD 7.5X6 YLW CONV (MISCELLANEOUS) ×2 IMPLANT
SLING ARM FOAM STRAP LRG (SOFTGOODS) IMPLANT
SLING ARM FOAM STRAP MED (SOFTGOODS) IMPLANT
SPIKE FLUID TRANSFER (MISCELLANEOUS) ×1 IMPLANT
SUT MNCRL AB 4-0 PS2 18 (SUTURE) ×1 IMPLANT
SUT PROLENE 6 0 BV (SUTURE) ×1 IMPLANT
SUT PROLENE 7 0 BV 1 (SUTURE) IMPLANT
SUT SILK 2 0 SH (SUTURE) IMPLANT
SUT SILK 3 0 SH CR/8 (SUTURE) ×1 IMPLANT
SUT VIC AB 3-0 SH 27 (SUTURE) ×1
SUT VIC AB 3-0 SH 27X BRD (SUTURE) ×1 IMPLANT
TOWEL GREEN STERILE (TOWEL DISPOSABLE) ×1 IMPLANT
UNDERPAD 30X36 HEAVY ABSORB (UNDERPADS AND DIAPERS) ×1 IMPLANT
WATER STERILE IRR 1000ML POUR (IV SOLUTION) ×1 IMPLANT

## 2022-12-03 NOTE — Op Note (Signed)
    NAME: Paul Bond    MRN: [id number] DOB: [birth year]    DATE OF OPERATION: 12/03/2022  PREOP DIAGNOSIS:    ESRD  POSTOP DIAGNOSIS:    Same  PROCEDURE:    Left arm brachiobasilic fistula creation  SURGEON: Victorino Sparrow  ASSIST: Nathanial Rancher, PA  ANESTHESIA: General   EBL: 15ml  INDICATIONS:    Paul Bond is a 82 y.o. male in need of long term HD access.  After discussing risk benefits of left arm brachiobasilic fistula versus AV graft, Paul Bond elected to proceed.  FINDINGS:   4 mm basilic vein 4.76mm brachial artery  TECHNIQUE:   The patient was brought to the operating room and placed in supine position. The left arm was prepped and draped in a standard fashion. IV antibiotics were prior to incision. A timeout was performed.   The basilic vein in the left arm was identified using ultrasound and appeared of sufficient size. A transverse incision was made above the elbow crease proximal to the antecubital fossa. The basilic vein was identified and isolated for 4 cm in length.  The bicipital aponeurosis was partially released and the brachial artery freed from its paired brachial veins and secured with a vessel loop. The artery was lateral to the median nerve at this location. The patient was heparinized. The basilic vein was marked and ligated distally with 2-0 silk, then flushed with heparinized saline. Vascular clamps were placed proximally and distally on the brachial artery and a 5 mm arteriotomy  was created on the brachial artery. This was flushed with heparin saline. The vein was juxtaposed to the artery, sitting directly anterior to the nerve (draping it) and an anastomosis was created using 6-0 Prolene.   Prior to completing the anastomsis, the vessels were flushed and the suture line was tied down. There was an excellent thrill in the basilic vein from the anastomosis into the upper arm. The patient had a 2+ radial pulse. The incision was irrigated and hemostasis  acheived. The deeper tissue was closed with 3-0 Vicryl and the skin closed with 4-0 Monocryl.    Dermabond was applied the incisions. He was transferred to PACU in stable condition.     Ladonna Snide, MD [facility] DATE OF DICTATION:   12/03/2022

## 2022-12-03 NOTE — Transfer of Care (Signed)
Immediate Anesthesia Transfer of Care Note  Patient: Aram Candela Elenbaas  Procedure(s) Performed: LEFT ARM ARTERIOVENOUS FISTULA CREATION (Left: Arm Upper)  Patient Location: PACU  Anesthesia Type:General  Level of Consciousness: awake and alert   Airway & Oxygen Therapy: Patient Spontanous Breathing and Patient connected to face mask oxygen  Post-op Assessment: Report given to RN and Post -op Vital signs reviewed and stable  Post vital signs: Reviewed and stable  Last Vitals:  Vitals Value Taken Time  BP 124/74 12/03/22 1723  Temp    Pulse 83 12/03/22 1726  Resp 16 12/03/22 1726  SpO2 100 % 12/03/22 1726  Vitals shown include unvalidated device data.  Last Pain:  Vitals:   12/03/22 0909  TempSrc: Oral         Complications: No notable events documented.

## 2022-12-03 NOTE — Anesthesia Procedure Notes (Signed)
Procedure Name: LMA Insertion Date/Time: 12/03/2022 3:46 PM  Performed by: Tressia Miners, CRNAPre-anesthesia Checklist: Patient identified, Emergency Drugs available, Suction available and Patient being monitored Patient Re-evaluated:Patient Re-evaluated prior to induction Oxygen Delivery Method: Circle System Utilized Preoxygenation: Pre-oxygenation with 100% oxygen Induction Type: IV induction Ventilation: Mask ventilation without difficulty LMA: LMA inserted LMA Size: 4.0 Number of attempts: 1 Airway Equipment and Method: Bite block Placement Confirmation: positive ETCO2 Tube secured with: Tape Dental Injury: Teeth and Oropharynx as per pre-operative assessment

## 2022-12-03 NOTE — H&P (Signed)
Office Note   Patient seen and examined in preop holding.  No complaints. No changes to medication history or physical exam since last seen in clinic. After discussing the risks and benefits of left arm AV fistula v graft creation, Paul Bond elected to proceed.   Victorino Sparrow MD   CC:  ESRD Requesting Provider:  No ref. provider found  HPI: Paul Bond is a Right handed 82 y.o. (Mar 12, 1941) male with kidney disease who presents at the request of No ref. provider found for permanent HD access. The patient has had one prior access procedure-left forearm graft. Per pt, previous tunneled lines have been placed in right IJ. Current access is right IJ. Dialysis days are Tuesday Thursday Saturday at the [place].   On exam, Paul Bond was doing well, accompanied by his daughter.  Originally from [facility] he served in [place].  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  He is mobile using a motorized wheelchair.  Remains able to stand pivot, and walk short distances..  Prior surgeries: Left forearm loop graft   Past Medical History:  Diagnosis Date   Chronic indwelling Foley catheter    Chronic kidney disease    Diabetes mellitus without complication (HCC)    Hypertension    Rectal cancer (HCC)    Stroke [facility])     Past Surgical History:  Procedure Laterality Date   BIOPSY  02/18/2020   Procedure: BIOPSY;  Surgeon: Benancio Deeds, MD;  Location: [facility] ENDOSCOPY;  Service: Gastroenterology;;   COLON SURGERY     COLONOSCOPY  02/18/2020   COLONOSCOPY WITH PROPOFOL N/A 02/18/2020   Procedure: COLONOSCOPY WITH PROPOFOL;  Surgeon: Benancio Deeds, MD;  Location: [facility] ENDOSCOPY;  Service: Gastroenterology;  Laterality: N/A;   CYSTOSCOPY W/ URETERAL STENT PLACEMENT Right 02/03/2020   Procedure: CYSTOSCOPY WITH RETROGRADE PYELOGRAM/URETERAL DOUBLE J STENT PLACEMENT;  Surgeon: Marcine Matar, MD;  Location: [facility] OR;  Service: Urology;  Laterality:  Right;   ESOPHAGOGASTRODUODENOSCOPY (EGD) WITH PROPOFOL N/A 02/18/2020   Procedure: ESOPHAGOGASTRODUODENOSCOPY (EGD) WITH PROPOFOL;  Surgeon: Benancio Deeds, MD;  Location: [facility] ENDOSCOPY;  Service: Gastroenterology;  Laterality: N/A;   IR FLUORO GUIDE CV LINE RIGHT  02/14/2022   IR US GUIDE VASC ACCESS RIGHT  02/14/2022   POLYPECTOMY  02/18/2020   Procedure: POLYPECTOMY;  Surgeon: Benancio Deeds, MD;  Location: [facility] ENDOSCOPY;  Service: Gastroenterology;;    Social History   Socioeconomic History   Marital status: Married    Spouse name: Not on file   Number of children: Not on file   Years of education: Not on file   Highest education level: Not on file  Occupational History   Occupation: retired  Tobacco Use   Smoking status: Former    Packs/day: 1.00    Years: 15.00    Additional pack years: 0.00    Total pack years: 15.00    Types: Cigarettes    Quit date: 1974    Years since quitting: 50.5   Smokeless tobacco: Never  Vaping Use   Vaping Use: Never used  Substance and Sexual Activity   Alcohol use: Not Currently    Comment: quit in 1974, binge drinking prior   Drug use: Never   Sexual activity: Not on file  Other Topics Concern   Not on file  Social History Narrative   Not on file   Social Determinants of Health   Financial Resource Strain: Not on file  Food  Insecurity: No Food Insecurity (02/14/2022)   Hunger Vital Sign    Worried About Running Out of Food in the Last Year: Never true    Ran Out of Food in the Last Year: Never true  Transportation Needs: No Transportation Needs (02/14/2022)   PRAPARE - Administrator, Civil Service (Medical): No    Lack of Transportation (Non-Medical): No  Physical Activity: Not on file  Stress: Not on file  Social Connections: Not on file  Intimate Partner Violence: Not At Risk (02/14/2022)   Humiliation, Afraid, Rape, and Kick questionnaire    Fear of Current or Ex-Partner: No    Emotionally Abused: No     Physically Abused: No    Sexually Abused: No   History reviewed. No pertinent family history.  Current Facility-Administered Medications  Medication Dose Route Frequency Provider Last Rate Last Admin   0.9 %  sodium chloride infusion   Intravenous Continuous Victorino Sparrow, MD 10 mL/hr at 12/03/22 0958 New Bag at 12/03/22 0958   ceFAZolin (ANCEF) 2-4 GM/100ML-% IVPB            ceFAZolin (ANCEF) IVPB 2g/100 mL premix  2 g Intravenous 30 min Pre-Op Victorino Sparrow, MD       chlorhexidine (HIBICLENS) 4 % liquid 4 Application  60 mL Topical Once Victorino Sparrow, MD       And   [START ON 12/04/2022] chlorhexidine (HIBICLENS) 4 % liquid 4 Application  60 mL Topical Once Victorino Sparrow, MD       insulin aspart (novoLOG) injection 0-7 Units  0-7 Units Subcutaneous Q2H PRN Shelton Silvas, MD        Allergies  Allergen Reactions   Lisinopril Cough   Semaglutide Other (See Comments)    Indigestion  *Ozempic     REVIEW OF SYSTEMS:   [X]  denotes positive finding, [ ]  denotes negative finding Cardiac  Comments:  Chest pain or chest pressure:    Shortness of breath upon exertion:    Short of breath when lying flat:    Irregular heart rhythm:        Vascular    Pain in calf, thigh, or hip brought on by ambulation:    Pain in feet at night that wakes you up from your sleep:     Blood clot in your veins:    Leg swelling:         Pulmonary    Oxygen at home:    Productive cough:     Wheezing:         Neurologic    Sudden weakness in arms or legs:     Sudden numbness in arms or legs:     Sudden onset of difficulty speaking or slurred speech:    Temporary loss of vision in one eye:     Problems with dizziness:         Gastrointestinal    Blood in stool:     Vomited blood:         Genitourinary    Burning when urinating:     Blood in urine:        Psychiatric    Major depression:         Hematologic    Bleeding problems:    Problems with blood clotting too easily:         Skin    Rashes or ulcers:        Constitutional    Fever or chills:  PHYSICAL EXAMINATION:  Vitals:   11/30/22 1011 12/03/22 0909 12/03/22 0948  BP:   136/67  Pulse:  85   Resp:  20   Temp:  98.3 F (36.8 C)   TempSrc:  Oral   SpO2:  98%   Weight: 59 kg 59 kg   Height: 5\' 7"  (1.702 m) 5\' 7"  (1.702 m)     General:  WDWN in NAD; vital signs documented above Gait: Not observed HENT: WNL, normocephalic Pulmonary: normal non-labored breathing , without Rales, rhonchi,  wheezing Cardiac: regular HR Abdomen: soft, NT, no masses Skin: without rashes Vascular Exam/Pulses:  Right Left  Radial 2+ (normal) 2+ (normal)                       Extremities: without ischemic changes, without Gangrene , without cellulitis; without open wounds;  Musculoskeletal: no muscle wasting or atrophy  Neurologic: A&O X 3;  No focal weakness or paresthesias are detected Psychiatric:  The pt has Normal affect.   Non-Invasive Vascular Imaging:   Left-sided basilic vein with diameter 3 mm.    ASSESSMENT/PLAN:  Paul Bond is a 82 y.o. male who presents with end stage renal disease  Based on vein mapping and examination, Paul Bond is a candidate for left arm brachiobasilic fistula creation. I had an extensive discussion with this patient in regards to the nature of access surgery, including risk, benefits, and alternatives.   The patient is aware that the risks of access surgery include but are not limited to: bleeding, infection, steal syndrome, nerve damage, ischemic monomelic neuropathy, failure of access to mature, complications related to venous hypertension, and possible need for additional access procedures in the future.  I discussed with the patient the nature of the staged access procedure, specifically the need for a second operation to transpose the first stage fistula if it matures adequately.   The patient has agreed to proceed with the above procedure which will be  scheduled at his convenience.  Victorino Sparrow, MD [facility] [phone]

## 2022-12-03 NOTE — Anesthesia Postprocedure Evaluation (Signed)
Anesthesia Post Note  Patient: Paul Bond  Procedure(s) Performed: LEFT ARM ARTERIOVENOUS FISTULA CREATION (Left: Arm Upper)     Patient location during evaluation: PACU Anesthesia Type: General Level of consciousness: awake and alert Pain management: pain level controlled Vital Signs Assessment: post-procedure vital signs reviewed and stable Respiratory status: spontaneous breathing, nonlabored ventilation, respiratory function stable and patient connected to nasal cannula oxygen Cardiovascular status: blood pressure returned to baseline and stable Postop Assessment: no apparent nausea or vomiting Anesthetic complications: no   No notable events documented.  Last Vitals:  Vitals:   12/03/22 1745 12/03/22 1755  BP: 132/69   Pulse: 72 87  Resp: (!) 9 13  Temp:  36.5 C  SpO2: 100% 100%    Last Pain:  Vitals:   12/03/22 1745  TempSrc:   PainSc: 0-No pain                 Shelton Silvas

## 2022-12-03 NOTE — Discharge Instructions (Signed)
[facility]  Discharge Instructions  AV Fistula or Graft Surgery for Dialysis Access  Please refer to the following instructions for your post-procedure care. Your surgeon or physician assistant will discuss any changes with you.  Activity  You may drive the day following your surgery, if you are comfortable and no longer taking prescription pain medication. Resume full activity as the soreness in your incision resolves.  Bathing/Showering  You may shower after you go home. Keep your incision dry for 48 hours. Do not soak in a bathtub, hot tub, or swim until the incision heals completely. You may not shower if you have a hemodialysis catheter.  Incision Care  Clean your incision with mild soap and water after 48 hours. Pat the area dry with a clean towel. You do not need a bandage unless otherwise instructed. Do not apply any ointments or creams to your incision. You may have skin glue on your incision. Do not peel it off. It will come off on its own in about one week. Your arm may swell a bit after surgery. To reduce swelling use pillows to elevate your arm so it is above your heart. Your doctor will tell you if you need to lightly wrap your arm with an ACE bandage.  Diet  Resume your normal diet. There are not special food restrictions following this procedure. In order to heal from your surgery, it is CRITICAL to get adequate nutrition. Your body requires vitamins, minerals, and protein. Vegetables are the best source of vitamins and minerals. Vegetables also provide the perfect balance of protein. Processed food has [place] nutritional value, so try to avoid this.  Medications  Resume taking all of your medications. If your incision is causing pain, you may take over-the counter pain relievers such as acetaminophen (Tylenol). If you were prescribed a stronger pain medication, please be aware these medications can cause nausea and constipation. Prevent  nausea by taking the medication with a snack or meal. Avoid constipation by drinking plenty of fluids and eating foods with high amount of fiber, such as fruits, vegetables, and grains.  Do not take Tylenol if you are taking prescription pain medications.  Follow up Your surgeon may want to see you in the office following your access surgery. If so, this will be arranged at the time of your surgery.  Please call us immediately for any of the following conditions:  Increased pain, redness, drainage (pus) from your incision site Fever of 101 degrees or higher Severe or worsening pain at your incision site Hand pain or numbness.  Reduce your risk of vascular disease:  Stop smoking. If you would like help, call [facility] at 1-800-QUIT-NOW (631-263-0872) or [place] at [phone]  Manage your cholesterol Maintain a desired weight Control your diabetes Keep your blood pressure down  Dialysis  It will take several weeks to several months for your new dialysis access to be ready for use. Your surgeon will determine when it is okay to use it. Your nephrologist will continue to direct your dialysis. You can continue to use your Permcath until your new access is ready for use.   12/03/2022 Paul Bond [id number] 28-Apr-1941  Surgeon(s): Victorino Sparrow, MD  Procedure(s): LEFT ARM ARTERIOVENOUS FISTULA CREATION   May stick graft immediately   May stick graft on designated area only:   X Do not stick Left AV fistula for 12 weeks    If you have any questions, please call the office at  [phone]. 

## 2022-12-03 NOTE — Anesthesia Preprocedure Evaluation (Signed)
Anesthesia Evaluation  Patient identified by MRN, date of birth, ID band Patient awake    Reviewed: Allergy & Precautions, NPO status , Patient's Chart, lab work & pertinent test results  Airway Mallampati: II  TM Distance: >3 FB Neck ROM: Full    Dental  (+) Edentulous Upper, Edentulous Lower   Pulmonary former smoker    + decreased breath sounds      Cardiovascular hypertension, Pt. on medications  Rhythm:Regular Rate:Normal     Neuro/Psych CVA  negative psych ROS   GI/Hepatic Neg liver ROS, PUD,,,  Endo/Other  diabetes, Insulin Dependent    Renal/GU Renal disease     Musculoskeletal negative musculoskeletal ROS (+)    Abdominal   Peds  Hematology   Anesthesia Other Findings   Reproductive/Obstetrics                             Anesthesia Physical Anesthesia Plan  ASA: 3  Anesthesia Plan: General   Post-op Pain Management: Tylenol PO (pre-op)*   Induction: Intravenous  PONV Risk Score and Plan: 3 and Ondansetron, Dexamethasone and Treatment may vary due to age or medical condition  Airway Management Planned: LMA  Additional Equipment: None  Intra-op Plan:   Post-operative Plan: Extubation in OR  Informed Consent: I have reviewed the patients History and Physical, chart, labs and discussed the procedure including the risks, benefits and alternatives for the proposed anesthesia with the patient or authorized representative who has indicated his/her understanding and acceptance.       Plan Discussed with: CRNA  Anesthesia Plan Comments:        Anesthesia Quick Evaluation

## 2022-12-04 ENCOUNTER — Encounter (HOSPITAL_COMMUNITY): Payer: Self-pay | Admitting: Vascular Surgery

## 2022-12-27 ENCOUNTER — Other Ambulatory Visit: Payer: Self-pay | Admitting: *Deleted

## 2022-12-27 DIAGNOSIS — N186 End stage renal disease: Secondary | ICD-10-CM

## 2023-01-18 ENCOUNTER — Ambulatory Visit (INDEPENDENT_AMBULATORY_CARE_PROVIDER_SITE_OTHER): Payer: No Typology Code available for payment source | Admitting: Physician Assistant

## 2023-01-18 ENCOUNTER — Ambulatory Visit (HOSPITAL_COMMUNITY)
Admission: RE | Admit: 2023-01-18 | Discharge: 2023-01-18 | Disposition: A | Discharge status: Home / Self Care | Payer: Medicare Other | Source: Ambulatory Visit | Attending: Vascular Surgery | Admitting: Vascular Surgery

## 2023-01-18 VITALS — BP 95/53 | HR 89 | Temp 97.9°F | Resp 16 | Ht 67.0 in

## 2023-01-18 DIAGNOSIS — N186 End stage renal disease: Secondary | ICD-10-CM

## 2023-01-18 DIAGNOSIS — T82868A Thrombosis of vascular prosthetic devices, implants and grafts, initial encounter: Secondary | ICD-10-CM | POA: Diagnosis not present

## 2023-01-18 DIAGNOSIS — Z992 Dependence on renal dialysis: Secondary | ICD-10-CM | POA: Insufficient documentation

## 2023-01-18 DIAGNOSIS — T82590A Other mechanical complication of surgically created arteriovenous fistula, initial encounter: Secondary | ICD-10-CM | POA: Insufficient documentation

## 2023-01-18 NOTE — Progress Notes (Signed)
POST OPERATIVE OFFICE NOTE    CC:  F/u for surgery  HPI:  Tori Grewell is a 82 y.o. male who is s/p left first stage basilic fistula creation by Dr. Karin Lieu on 12/03/2022.  He has a prior history of left forearm loop graft is occluded.  Pt returns today for follow up.  Pt states he has done fine since surgery.  He denies any issues with his incision such as erythema, drainage, or hematoma.  He denies any symptoms of steal in the left hand such as weakness, numbness, excessive coldness, or pain.  He dialyzes on Tuesdays, Thursdays, and Saturdays via right IJ TDC.   Allergies  Allergen Reactions   Lisinopril Cough   Semaglutide Other (See Comments)    Indigestion  *Ozempic    Current Outpatient Medications  Medication Sig Dispense Refill   Calcium Carbonate Antacid (CALCIUM CARBONATE, DOSED IN MG ELEMENTAL CALCIUM,) 1250 MG/5ML SUSP Take 5 mLs (500 mg of elemental calcium total) by mouth every 6 (six) hours as needed for indigestion. (Patient taking differently: Take 500 mg of elemental calcium by mouth 2 (two) times daily.) 473 mL 1   Cholecalciferol (VITAMIN D3) 50 MCG (2000 UT) TABS Take 2,000 Units by mouth daily.     diltiazem (TIAZAC) 240 MG 24 hr capsule Take 240 mg by mouth daily.     glucose 4 GM chewable tablet Chew 4 tablets by mouth daily as needed for low blood sugar.  for low blood sugar, repeat every 15 minutes if blood sugar is less than 70     hydrALAZINE (APRESOLINE) 100 MG tablet Take 100 mg by mouth 3 (three) times daily.     insulin aspart (NOVOLOG) 100 UNIT/ML injection Inject 0-6 Units into the skin 3 (three) times daily with meals. 10 mL 11   insulin glargine-yfgn (SEMGLEE) 100 UNIT/ML injection Inject 8 Units into the skin daily.     iron polysaccharides (NIFEREX) 150 MG capsule Take 1 capsule (150 mg total) by mouth daily. 30 capsule 2   latanoprost (XALATAN) 0.005 % ophthalmic solution Place 1 drop into both eyes every evening.     Multiple Vitamin  (MULTIVITAMIN WITH MINERALS) TABS tablet Take 1 tablet by mouth daily.     Nutritional Supplements (FEEDING SUPPLEMENT, NEPRO CARB STEADY,) LIQD Take 237 mLs by mouth 2 (two) times daily between meals.  0   traMADol (ULTRAM) 50 MG tablet Take 1 tablet (50 mg total) by mouth every 6 (six) hours as needed. 10 tablet 0   UNABLE TO FIND Take 1 Scoop by mouth daily. Med Name: Maree Erie   food supplement     No current facility-administered medications for this visit.     ROS:  See HPI  Physical Exam:  Incision: Left upper arm incision well-healed without drainage, hematoma, or erythema Extremities: Left basilic fistula with great thrill on palpation throughout upper arm.  Palpable left radial pulse Neuro: Intact motor and sensation of left upper extremity  Studies: Dialysis duplex (01/18/2023) +--------------------+----------+-----------------+--------+  AVF                PSV (cm/s)Flow Vol (mL/min)Comments  +--------------------+----------+-----------------+--------+  Native artery inflow   217          1374                 +--------------------+----------+-----------------+--------+  AVF Anastomosis        318                               +--------------------+----------+-----------------+--------+     +------------+-----------+-------------+-----------+-----------------------  ----+  OUTFLOW VEINPSV (cm/s) Diameter (cm)Depth (cm)          Describe             +------------+-----------+-------------+-----------+-----------------------  ----+  Mid UA      147 / 340 /    0.60        0.62          Retained valve                          161                                                          +------------+-----------+-------------+-----------+-----------------------  ----+  Dist UA         254        0.56        0.55                                  +------------+-----------+-------------+-----------+-----------------------   ----+  AC Fossa     254 / 516  0.56 / 0.34 0.34 / 0.44  partially-occlusive  and                                                            thrombus             +------------+-----------+-------------+-----------+-----------------------  ----+     Assessment/Plan:  This is a 82 y.o. male who is here for postop visit  -The patient has a left basilic fistula that was first created on 12/03/2022.  His left upper arm incision is well-healed without signs of infection -On exam his fistula has a great thrill on palpation throughout the entire arm.  He also has a palpable left radial pulse and denies any symptoms of steal. -Duplex of his fistula demonstrates a nearly matured fistula with diameters close to 6 mm throughout the arm.  The fistula has great flow of 1374 mL/min.  There is evidence of a retained valve in the mid upper arm along with partially occlusive thrombus and stenosis in the [facility] fossa. -This case was discussed with Dr. Karin Lieu and he agrees a second stage surgery can be scheduled without fistulogram.  -The patient will be scheduled for left second stage basilic fistula surgery in the next 2-3 weeks on a Monday or Wednesday with Dr. Karin Lieu.  We will address this the patient's stenosis and thrombus during his surgery.   Loel Dubonnet, PA-C [facility] [phone]   Clinic MD:  Karin Lieu

## 2023-01-23 ENCOUNTER — Other Ambulatory Visit: Payer: Self-pay

## 2023-01-23 ENCOUNTER — Telehealth: Payer: Self-pay

## 2023-01-23 DIAGNOSIS — N186 End stage renal disease: Secondary | ICD-10-CM

## 2023-01-23 NOTE — Telephone Encounter (Signed)
Attempted to reach patient to schedule left arm 2nd stage BVT, no answer and unable to leave message- VM full.

## 2023-01-24 ENCOUNTER — Other Ambulatory Visit: Payer: Self-pay

## 2023-01-24 ENCOUNTER — Encounter (HOSPITAL_COMMUNITY): Payer: Self-pay | Admitting: Vascular Surgery

## 2023-01-24 NOTE — Progress Notes (Signed)
Signed      SDW call   Patients daughter, Wyatt Mage was given pre-op instructions over the phone. She verbalized understanding of instructions provided.    [facility] [place] [facility]ologist - [facility] [place] [facility]:    PPM/ICD - denies   Chest x-ray - 06/03/2022 EKG -  02/13/2022 Stress Test - ECHO - 02/03/2020 Cardiac Cath -    Sleep Study/sleep apnea/CPAP: denies   Type II diabetic Fasting Blood sugar range: 130-170 How often check sugars: daily Insulin aspart (Novolog), instructed to take zero units day of surgery. If BS>220 then use 1/2 the correction dose Insulin glargine (Semglee) instructed to use 4 units ( which is 50% of regular dose) the evening before surgery   Blood Thinner Instructions: deneis Aspirin Instructions:denies   ERAS Protcol - No, NPO   COVID TEST- n/a    Anesthesia review: Yes. DM, HTN, Stroke, ESRD   Patient denies shortness of breath, fever, cough and chest pain over the phone call   Your procedure is scheduled on Monday January 28, 2023             Report to [facility] Main Entrance "A" at 10205  A.M., then check in with the Admitting office.             Call this number if you have problems the morning of surgery:             [phone]    If you have any questions prior to your surgery date call [phone]: Open Monday-Friday 8am-4pm If you experience any cold or flu symptoms such as cough, fever, chills, shortness of breath, etc. between now and your scheduled surgery, please notify us at the above number                Remember:             Do not eat or drink after midnight the night before your surgery   Take these medicines the morning of surgery with A SIP OF WATER:  Hydralazine   As of today, STOP taking any Aspirin (unless otherwise instructed by your surgeon) Aleve, Naproxen, Ibuprofen, Motrin, Advil, Goody's, BC's, all herbal medications, fish oil, and all vitamins.

## 2023-01-28 ENCOUNTER — Other Ambulatory Visit: Payer: Self-pay

## 2023-01-28 ENCOUNTER — Ambulatory Visit (HOSPITAL_COMMUNITY): Payer: No Typology Code available for payment source | Admitting: Physician Assistant

## 2023-01-28 ENCOUNTER — Encounter (HOSPITAL_COMMUNITY): Payer: Self-pay | Admitting: Vascular Surgery

## 2023-01-28 ENCOUNTER — Encounter (HOSPITAL_COMMUNITY)
Admission: RE | Disposition: A | Discharge status: Home / Self Care | Payer: Self-pay | Source: Home / Self Care | Attending: Vascular Surgery

## 2023-01-28 ENCOUNTER — Ambulatory Visit (HOSPITAL_COMMUNITY)
Admission: RE | Admit: 2023-01-28 | Discharge: 2023-01-28 | Disposition: A | Discharge status: Home / Self Care | Payer: No Typology Code available for payment source | Attending: Vascular Surgery | Admitting: Vascular Surgery

## 2023-01-28 ENCOUNTER — Other Ambulatory Visit (HOSPITAL_COMMUNITY): Payer: Self-pay

## 2023-01-28 DIAGNOSIS — Z992 Dependence on renal dialysis: Secondary | ICD-10-CM

## 2023-01-28 DIAGNOSIS — N186 End stage renal disease: Secondary | ICD-10-CM | POA: Insufficient documentation

## 2023-01-28 DIAGNOSIS — Z87891 Personal history of nicotine dependence: Secondary | ICD-10-CM | POA: Diagnosis not present

## 2023-01-28 DIAGNOSIS — I120 Hypertensive chronic kidney disease with stage 5 chronic kidney disease or end stage renal disease: Secondary | ICD-10-CM

## 2023-01-28 DIAGNOSIS — N185 Chronic kidney disease, stage 5: Secondary | ICD-10-CM | POA: Diagnosis not present

## 2023-01-28 HISTORY — PX: BASCILIC VEIN TRANSPOSITION: SHX5742

## 2023-01-28 LAB — POCT I-STAT, CHEM 8
BUN: 70 mg/dL — ABNORMAL HIGH (ref 8–23)
Calcium, Ion: 1.04 mmol/L — ABNORMAL LOW (ref 1.15–1.40)
Chloride: 95 mmol/L — ABNORMAL LOW (ref 98–111)
Creatinine, Ser: 9.1 mg/dL — ABNORMAL HIGH (ref 0.61–1.24)
Glucose, Bld: 171 mg/dL — ABNORMAL HIGH (ref 70–99)
HCT: 28 % — ABNORMAL LOW (ref 39.0–52.0)
Hemoglobin: 9.5 g/dL — ABNORMAL LOW (ref 13.0–17.0)
Potassium: 4.9 mmol/L (ref 3.5–5.1)
Sodium: 133 mmol/L — ABNORMAL LOW (ref 135–145)
TCO2: 27 mmol/L (ref 22–32)

## 2023-01-28 LAB — GLUCOSE, CAPILLARY
Glucose-Capillary: 155 mg/dL — ABNORMAL HIGH (ref 70–99)
Glucose-Capillary: 154 mg/dL — ABNORMAL HIGH (ref 70–99)

## 2023-01-28 SURGERY — TRANSPOSITION, VEIN, BASILIC
Anesthesia: Monitor Anesthesia Care | Site: Arm Upper | Laterality: Left

## 2023-01-28 MED ORDER — ACETAMINOPHEN 160 MG/5ML PO SOLN: 1000.0000 mg | Freq: Once | ORAL | Status: DC | PRN

## 2023-01-28 MED ORDER — FENTANYL CITRATE (PF) 250 MCG/5ML IJ SOLN
INTRAMUSCULAR | Status: DC | PRN
  Administered 2023-01-28 (×2): 25 ug via INTRAVENOUS

## 2023-01-28 MED ORDER — ACETAMINOPHEN 500 MG PO TABS
ORAL_TABLET | ORAL | Status: AC
  Administered 2023-01-28: 1000 mg via ORAL
  Filled 2023-01-28: qty 2

## 2023-01-28 MED ORDER — PROPOFOL 10 MG/ML IV BOLUS
INTRAVENOUS | Status: AC
  Filled 2023-01-28: qty 20

## 2023-01-28 MED ORDER — HEPARIN SODIUM (PORCINE) 1000 UNIT/ML IJ SOLN
INTRAMUSCULAR | Status: DC | PRN
  Administered 2023-01-28: 2000 [IU] via INTRAVENOUS

## 2023-01-28 MED ORDER — CEFAZOLIN SODIUM-DEXTROSE 2-4 GM/100ML-% IV SOLN
2.0000 g | INTRAVENOUS | Status: AC
  Administered 2023-01-28: 2 g via INTRAVENOUS

## 2023-01-28 MED ORDER — SODIUM CHLORIDE 0.9 % IV SOLN: INTRAVENOUS | Status: DC

## 2023-01-28 MED ORDER — CHLORHEXIDINE GLUCONATE 4 % EX SOLN: 60.0000 mL | Freq: Once | CUTANEOUS | Status: DC

## 2023-01-28 MED ORDER — FENTANYL CITRATE (PF) 100 MCG/2ML IJ SOLN: 25.0000 ug | INTRAMUSCULAR | Status: DC | PRN

## 2023-01-28 MED ORDER — FENTANYL CITRATE (PF) 250 MCG/5ML IJ SOLN
INTRAMUSCULAR | Status: AC
  Filled 2023-01-28: qty 5

## 2023-01-28 MED ORDER — HEMOSTATIC AGENTS (NO CHARGE) OPTIME
TOPICAL | Status: DC | PRN
  Administered 2023-01-28: 1 via TOPICAL

## 2023-01-28 MED ORDER — PROPOFOL 10 MG/ML IV BOLUS
INTRAVENOUS | Status: DC | PRN
  Administered 2023-01-28: 110 mg via INTRAVENOUS
  Administered 2023-01-28: 30 mg via INTRAVENOUS
  Administered 2023-01-28 (×2): 10 mg via INTRAVENOUS

## 2023-01-28 MED ORDER — CHLORHEXIDINE GLUCONATE 0.12 % MT SOLN
OROMUCOSAL | Status: AC
  Administered 2023-01-28: 15 mL via OROMUCOSAL
  Filled 2023-01-28: qty 15

## 2023-01-28 MED ORDER — OXYCODONE HCL 5 MG PO TABS: 5.0000 mg | ORAL_TABLET | Freq: Once | ORAL | Status: DC | PRN

## 2023-01-28 MED ORDER — LIDOCAINE-EPINEPHRINE (PF) 1 %-1:200000 IJ SOLN
INTRAMUSCULAR | Status: DC | PRN
  Administered 2023-01-28: 10 mL

## 2023-01-28 MED ORDER — ACETAMINOPHEN 500 MG PO TABS: 1000.0000 mg | ORAL_TABLET | Freq: Once | ORAL | Status: AC

## 2023-01-28 MED ORDER — PHENYLEPHRINE 80 MCG/ML (10ML) SYRINGE FOR IV PUSH (FOR BLOOD PRESSURE SUPPORT)
PREFILLED_SYRINGE | INTRAVENOUS | Status: DC | PRN
  Administered 2023-01-28 (×2): 80 ug via INTRAVENOUS
  Administered 2023-01-28: 40 ug via INTRAVENOUS

## 2023-01-28 MED ORDER — 0.9 % SODIUM CHLORIDE (POUR BTL) OPTIME
TOPICAL | Status: DC | PRN
  Administered 2023-01-28: 1000 mL

## 2023-01-28 MED ORDER — CEFAZOLIN SODIUM-DEXTROSE 2-4 GM/100ML-% IV SOLN
INTRAVENOUS | Status: AC
  Filled 2023-01-28: qty 100

## 2023-01-28 MED ORDER — ACETAMINOPHEN 500 MG PO TABS: 1000.0000 mg | ORAL_TABLET | Freq: Once | ORAL | Status: DC | PRN

## 2023-01-28 MED ORDER — OXYCODONE HCL 5 MG/5ML PO SOLN: 5.0000 mg | Freq: Once | ORAL | Status: DC | PRN

## 2023-01-28 MED ORDER — ACETAMINOPHEN 10 MG/ML IV SOLN: 1000.0000 mg | Freq: Once | INTRAVENOUS | Status: DC | PRN

## 2023-01-28 MED ORDER — HEPARIN 6000 UNIT IRRIGATION SOLUTION
Status: DC | PRN
  Administered 2023-01-28: 1

## 2023-01-28 MED ORDER — HEPARIN 6000 UNIT IRRIGATION SOLUTION
Status: AC
  Filled 2023-01-28: qty 500

## 2023-01-28 MED ORDER — LIDOCAINE-EPINEPHRINE (PF) 1 %-1:200000 IJ SOLN
INTRAMUSCULAR | Status: AC
  Filled 2023-01-28: qty 30

## 2023-01-28 MED ORDER — ORAL CARE MOUTH RINSE: 15.0000 mL | Freq: Once | OROMUCOSAL | Status: AC

## 2023-01-28 MED ORDER — INSULIN ASPART 100 UNIT/ML IJ SOLN: 0.0000 [IU] | INTRAMUSCULAR | Status: DC | PRN

## 2023-01-28 MED ORDER — TRAMADOL HCL 50 MG PO TABS
50.0000 mg | ORAL_TABLET | Freq: Four times a day (QID) | ORAL | 0 refills | Status: AC | PRN
Start: 2023-01-28 — End: 2024-01-28
  Filled 2023-01-28: qty 20, 5d supply, fill #0

## 2023-01-28 MED ORDER — ONDANSETRON HCL 4 MG/2ML IJ SOLN
INTRAMUSCULAR | Status: DC | PRN
Start: 2023-01-28 — End: 2023-01-28
  Administered 2023-01-28: 4 mg via INTRAVENOUS

## 2023-01-28 MED ORDER — CHLORHEXIDINE GLUCONATE 0.12 % MT SOLN: 15.0000 mL | Freq: Once | OROMUCOSAL | Status: AC

## 2023-01-28 SURGICAL SUPPLY — 45 items
ADH SKN CLS APL DERMABOND .7 (GAUZE/BANDAGES/DRESSINGS) ×2
ARMBAND PINK RESTRICT EXTREMIT (MISCELLANEOUS) ×1 IMPLANT
BAG COUNTER SPONGE SURGICOUNT (BAG) ×1 IMPLANT
BAG SPNG CNTER NS LX DISP (BAG) ×1
BNDG CMPR 5X4 KNIT ELC UNQ LF (GAUZE/BANDAGES/DRESSINGS) ×1
BNDG ELASTIC 4INX 5YD STR LF (GAUZE/BANDAGES/DRESSINGS) IMPLANT
BNDG ELASTIC 4X5.8 VLCR STR LF (GAUZE/BANDAGES/DRESSINGS) ×1 IMPLANT
CANISTER SUCT 3000ML PPV (MISCELLANEOUS) ×1 IMPLANT
CATH EMB 4FR 40 (CATHETERS) IMPLANT
CLIP TI MEDIUM 24 (CLIP) ×1 IMPLANT
CLIP TI WIDE RED SMALL 24 (CLIP) ×1 IMPLANT
COVER PROBE W GEL 5X96 (DRAPES) ×1 IMPLANT
DERMABOND ADVANCED .7 DNX12 (GAUZE/BANDAGES/DRESSINGS) ×1 IMPLANT
ELECT REM PT RETURN 9FT ADLT (ELECTROSURGICAL) ×1
ELECTRODE REM PT RTRN 9FT ADLT (ELECTROSURGICAL) ×1 IMPLANT
GLOVE BIOGEL PI IND STRL 8 (GLOVE) ×1 IMPLANT
GOWN STRL REUS W/ TWL LRG LVL3 (GOWN DISPOSABLE) ×2 IMPLANT
GOWN STRL REUS W/TWL 2XL LVL3 (GOWN DISPOSABLE) ×2 IMPLANT
GOWN STRL REUS W/TWL LRG LVL3 (GOWN DISPOSABLE) ×2
HEMOSTAT SNOW SURGICEL 2X4 (HEMOSTASIS) IMPLANT
KIT BASIN OR (CUSTOM PROCEDURE TRAY) ×1 IMPLANT
KIT TURNOVER KIT B (KITS) ×1 IMPLANT
LOOP VASCULAR MINI 18 RED (MISCELLANEOUS) ×1
NDL HYPO 25GX1X1/2 BEV (NEEDLE) ×1 IMPLANT
NEEDLE HYPO 25GX1X1/2 BEV (NEEDLE) ×1 IMPLANT
NS IRRIG 1000ML POUR BTL (IV SOLUTION) ×1 IMPLANT
PACK CV ACCESS (CUSTOM PROCEDURE TRAY) ×1 IMPLANT
PAD ARMBOARD 7.5X6 YLW CONV (MISCELLANEOUS) ×2 IMPLANT
SLING ARM FOAM STRAP LRG (SOFTGOODS) IMPLANT
SLING ARM FOAM STRAP MED (SOFTGOODS) IMPLANT
SPIKE FLUID TRANSFER (MISCELLANEOUS) ×1 IMPLANT
SUT MNCRL AB 4-0 PS2 18 (SUTURE) ×1 IMPLANT
SUT PROLENE 6 0 BV (SUTURE) ×1 IMPLANT
SUT PROLENE 7 0 BV 1 (SUTURE) IMPLANT
SUT SILK 2 0 SH (SUTURE) IMPLANT
SUT SILK 2 0 SH CR/8 (SUTURE) ×1 IMPLANT
SUT VIC AB 2-0 CT1 27 (SUTURE) ×1
SUT VIC AB 2-0 CT1 TAPERPNT 27 (SUTURE) ×1 IMPLANT
SUT VIC AB 3-0 SH 27 (SUTURE) ×3
SUT VIC AB 3-0 SH 27X BRD (SUTURE) ×2 IMPLANT
SYR 3ML LL SCALE MARK (SYRINGE) IMPLANT
TOWEL GREEN STERILE (TOWEL DISPOSABLE) ×1 IMPLANT
UNDERPAD 30X36 HEAVY ABSORB (UNDERPADS AND DIAPERS) ×1 IMPLANT
VASCULAR TIE MINI RED 18IN STL (MISCELLANEOUS) IMPLANT
WATER STERILE IRR 1000ML POUR (IV SOLUTION) ×1 IMPLANT

## 2023-01-28 NOTE — H&P (Signed)
POST OPERATIVE OFFICE NOTE  Patient seen and examined in preop holding.  No complaints. No changes to medication history or physical exam since last seen in clinic. After discussing the risks and benefits of left arm brachiobasilic fistula transposition, Paul Bond elected to proceed.   Paul Sparrow MD   CC:  F/u for surgery  HPI:  Paul Bond is a 82 y.o. male who is s/p left first stage basilic fistula creation by Dr. Karin Lieu on 12/03/2022.  He has a prior history of left forearm loop graft is occluded.  Pt returns today for follow up.  Pt states he has done fine since surgery.  He denies any issues with his incision such as erythema, drainage, or hematoma.  He denies any symptoms of steal in the left hand such as weakness, numbness, excessive coldness, or pain.  He dialyzes on Tuesdays, Thursdays, and Saturdays via right IJ TDC.   Allergies  Allergen Reactions   Lisinopril Cough   Semaglutide Other (See Comments)    Indigestion  *Ozempic    No current facility-administered medications for this encounter.   Current Outpatient Medications  Medication Sig Dispense Refill   cinacalcet (SENSIPAR) 30 MG tablet Take 30 mg by mouth 3 (three) times a week. Tues, Thurs, and Sat     Ensure (ENSURE) Take 237 mLs by mouth 2 (two) times daily.     glucose 4 GM chewable tablet Chew 4 tablets by mouth daily as needed for low blood sugar.  for low blood sugar, repeat every 15 minutes if blood sugar is less than 70     insulin glargine (LANTUS) 100 UNIT/ML injection Inject 8 Units into the skin daily.     latanoprost (XALATAN) 0.005 % ophthalmic solution Place 1 drop into both eyes every evening.     sevelamer carbonate (RENVELA) 800 MG tablet Take 800 mg by mouth 3 (three) times daily before meals.     UNABLE TO FIND Take 1 fluid ounce by mouth daily. Med Name: LIQUACEL LIQUID,GRAPE   food supplement       ROS:  See HPI  Physical Exam:  Incision: Left upper arm incision well-healed  without drainage, hematoma, or erythema Extremities: Left basilic fistula with great thrill on palpation throughout upper arm.  Palpable left radial pulse Neuro: Intact motor and sensation of left upper extremity  Studies: Dialysis duplex (01/18/2023) +--------------------+----------+-----------------+--------+  AVF                PSV (cm/s)Flow Vol (mL/min)Comments  +--------------------+----------+-----------------+--------+  Native artery inflow   217          1374                 +--------------------+----------+-----------------+--------+  AVF Anastomosis        318                               +--------------------+----------+-----------------+--------+     +------------+-----------+-------------+-----------+-----------------------  ----+  OUTFLOW VEINPSV (cm/s) Diameter (cm)Depth (cm)          Describe             +------------+-----------+-------------+-----------+-----------------------  ----+  Mid UA      147 / 340 /    0.60        0.62          Retained valve  161                                                          +------------+-----------+-------------+-----------+-----------------------  ----+  Dist UA         254        0.56        0.55                                  +------------+-----------+-------------+-----------+-----------------------  ----+  AC Fossa     254 / 516  0.56 / 0.34 0.34 / 0.44  partially-occlusive  and                                                            thrombus             +------------+-----------+-------------+-----------+-----------------------  ----+     Assessment/Plan:  This is a 82 y.o. male who is here for postop visit  -The patient has a left basilic fistula that was first created on 12/03/2022.  His left upper arm incision is well-healed without signs of infection -On exam his fistula has a great thrill on palpation throughout the entire arm.   He also has a palpable left radial pulse and denies any symptoms of steal. -Duplex of his fistula demonstrates a nearly matured fistula with diameters close to 6 mm throughout the arm.  The fistula has great flow of 1374 mL/min.  There is evidence of a retained valve in the mid upper arm along with partially occlusive thrombus and stenosis in the [facility] fossa. -This case was discussed with Dr. Karin Lieu and he agrees a second stage surgery can be scheduled without fistulogram.  -The patient will be scheduled for left second stage basilic fistula surgery in the next 2-3 weeks on a Monday or Wednesday with Dr. Karin Lieu.  We will address this the patient's stenosis and thrombus during his surgery.   Loel Dubonnet, PA-C [facility] [phone]   Clinic MD:  Karin Lieu

## 2023-01-28 NOTE — Op Note (Signed)
    NAME: Paul Bond    MRN: [id number] DOB: 1941-05-12    DATE OF OPERATION: 01/28/2023  PREOP DIAGNOSIS:    End stage renal disease  POSTOP DIAGNOSIS:    Same  PROCEDURE:    Left arm brachiobasilic fistula transposition   SURGEON: Victorino Sparrow  ASSIST: Mosetta Pigeon  ANESTHESIA: General   EBL: 30ml  INDICATIONS:    [name] is a 82 y.o. male with end stage renal disease and recent left arm brachiobasilic fistula creation.  After discussing the risk benefits of left arm brachiobasilic transposition, Tyleek elected to proceed.  FINDINGS:   6 mm brachiobasilic fistula  TECHNIQUE:   Patient was brought to the OR and laid in supine position.  Moderate anesthesia was induced. The patient was prepped and draped in standard fashion.  Lidocaine was brought to the field and a local block was performed.   The case began with left arm ultrasound fistula mapping.  Multiple branches were noted and marked. Three longitudinal skip incisions were made along the course of the basilic vein with 5 cm bridges.  This was carried through the subcutaneous fat to the brachiobasilic fistula.  The fistula was mobilized and multiple branches ligated using 2-0 silk and clips.  Once mobilized,  a gore tunneler was brought on to the field, and a subcutaneous tunnel was made along the medial aspect of the bicep. Next, the patient as heparinized, fistula marked, clamped and transected. The vein was pulled through the tunnel tract and reanastomosed using 6.0 prolene suture in running fashion in the axilla. The wound bed was irrigated with saline, hemostasis achieved with suture and cautery. The wounds were closed with layers of vicryl suture with monocryl and dermabond at the skin. There was a palpable thrill in the fistula at case completion with excellent signal in the wrist.     Given the complexity of the case,  the assistant was necessary in order to expedient the procedure and safely  perform the technical aspects of the operation.  The assistant provided traction and countertraction to assist with exposure of the artery and vein.  They also assisted with suture ligation of multiple venous branches.  They played a critical role in the anastomosis. These skills, especially following the Prolene suture for the anastomosis, could not have been adequately performed by a scrub tech assistant.   Ladonna Snide, MD [facility] DATE OF DICTATION:   01/28/2023

## 2023-01-28 NOTE — Discharge Instructions (Signed)

## 2023-01-28 NOTE — Anesthesia Preprocedure Evaluation (Signed)
Anesthesia Evaluation  Patient identified by MRN, date of birth, ID band Patient awake    Reviewed: Allergy & Precautions, NPO status , Patient's Chart, lab work & pertinent test results  History of Anesthesia Complications Negative for: history of anesthetic complications  Airway Mallampati: II  TM Distance: >3 FB Neck ROM: Full    Dental  (+) Dental Advisory Given, Edentulous Upper,    Pulmonary neg shortness of breath, neg sleep apnea, neg COPD, neg recent URI, former smoker   breath sounds clear to auscultation       Cardiovascular hypertension,  Rhythm:Regular     Neuro/Psych neg Seizures CVA, No Residual Symptoms    GI/Hepatic Neg liver ROS,,,  Endo/Other  diabetes, Insulin Dependent  Lab Results      Component                Value               Date                      HGBA1C                   7.9 (H)             08/15/2021             Renal/GU ESRF and DialysisRenal diseaseLab Results      Component                Value               Date                      NA                       133 (L)             01/28/2023                K                        4.9                 01/28/2023                CO2                      32                  08/03/2022                GLUCOSE                  171 (H)             01/28/2023                BUN                      70 (H)              01/28/2023                CREATININE               9.10 (H)            01/28/2023  CALCIUM                  9.0                 08/03/2022                GFRNONAA                 12 (L)              08/03/2022                Musculoskeletal   Abdominal   Peds  Hematology  (+) Blood dyscrasia Lab Results      Component                Value               Date                      WBC                      11.7 (H)            08/03/2022                HGB                      9.5 (L)             01/28/2023                 HCT                      28.0 (L)            01/28/2023                MCV                      93.4                08/03/2022                PLT                      239                 08/03/2022              Anesthesia Other Findings   Reproductive/Obstetrics                              Anesthesia Physical Anesthesia Plan  ASA: 3  Anesthesia Plan: MAC   Post-op Pain Management:    Induction: Intravenous  PONV Risk Score and Plan: 1 and Propofol infusion and Treatment may vary due to age or medical condition  Airway Management Planned: Awake Intubation Planned, Nasal Cannula, Natural Airway and Simple Face Mask  Additional Equipment: None  Intra-op Plan:   Post-operative Plan:   Informed Consent: I have reviewed the patients History and Physical, chart, labs and discussed the procedure including the risks, benefits and alternatives for the proposed anesthesia with the patient or authorized representative who has indicated his/her understanding and acceptance.     Dental advisory given  Plan Discussed with: CRNA  Anesthesia Plan Comments:  Anesthesia Quick Evaluation

## 2023-01-28 NOTE — Transfer of Care (Signed)
Immediate Anesthesia Transfer of Care Note  Patient: Paul Bond  Procedure(s) Performed: LEFT ARM SECOND STAGE BASILIC VEIN TRANSPOSITION (Left: Arm Upper)  Patient Location: PACU  Anesthesia Type:General  Level of Consciousness: patient cooperative and responds to stimulation  Airway & Oxygen Therapy: Patient Spontanous Breathing  Post-op Assessment: Report given to RN, Post -op Vital signs reviewed and stable, and Patient moving all extremities X 4  Post vital signs: Reviewed and stable  Last Vitals:  Vitals Value Taken Time  BP 144/66 01/28/23 1236  Temp    Pulse 70 01/28/23 1243  Resp 13 01/28/23 1243  SpO2 99 % 01/28/23 1243  Vitals shown include unfiled device data.  Last Pain:  Vitals:   01/28/23 0927  TempSrc:   PainSc: 0-No pain         Complications: No notable events documented.

## 2023-01-29 NOTE — Anesthesia Postprocedure Evaluation (Signed)
Anesthesia Post Note  Patient: Paul Bond  Procedure(s) Performed: LEFT ARM SECOND STAGE BASILIC VEIN TRANSPOSITION (Left: Arm Upper)     Patient location during evaluation: PACU Anesthesia Type: General Level of consciousness: awake and alert Pain management: pain level controlled Vital Signs Assessment: post-procedure vital signs reviewed and stable Respiratory status: spontaneous breathing, nonlabored ventilation, respiratory function stable and patient connected to nasal cannula oxygen Cardiovascular status: blood pressure returned to baseline and stable Postop Assessment: no apparent nausea or vomiting Anesthetic complications: no   No notable events documented.  Last Vitals:  Vitals:   01/28/23 1300 01/28/23 1315  BP: 128/72 139/88  Pulse: 64 70  Resp: (!) 9 16  Temp:  (!) 36.1 C  SpO2: 99% 99%    Last Pain:  Vitals:   01/28/23 1315  TempSrc:   PainSc: 0-No pain                 River Mckercher

## 2023-01-30 ENCOUNTER — Encounter (HOSPITAL_COMMUNITY): Payer: Self-pay | Admitting: Vascular Surgery

## 2023-02-26 ENCOUNTER — Other Ambulatory Visit: Payer: Self-pay | Admitting: Hematology and Oncology

## 2023-02-26 DIAGNOSIS — D472 Monoclonal gammopathy: Secondary | ICD-10-CM

## 2023-02-26 NOTE — Progress Notes (Signed)
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 Leonides Bond  11/21/2021: bone marrow biopsy performed, inadequate sample 12/22/2021: bone marrow biopsy repeated, inadequate sample 12/28/2021: discussed results with Paul Bond. Patient opted not to undergo a 3rd bone marrow biopsy.   Interval History:  Paul Bond 82 y.o. male with medical history significant for an IgM monoclonal gammopathy and anemia of chronic disease who presents for a follow up visit at the request [facility].   Paul Bond reports he has been well overall in the last 6 months since our last visit.  He reports he is had no changes in his health.  He is had no hospitalizations, emergency room visits, or new medications.  He continues his dialysis 3 times per week.  He reports that his energy levels have been good overall and he is not having any lightheadedness, dizziness, or shortness of breath.  He denies any bone or back pain.  He is had no recent infectious symptoms such as runny nose, sore throat, or cough.  He reports that he has been eating well and his weight has been steady.  He is had no bleeding, bruising, or dark stools.  He reports that he is unsure what his dry weight is, unsure if he has been gaining or losing weight.  He denies fevers, chills, sweats, shortness of breath, chest pain or cough. He has no other complaints. Rest of the 10 point ROS is below.    MEDICAL HISTORY:  Past Medical History:  Diagnosis Date   Chronic indwelling Foley catheter    Chronic kidney disease    Diabetes mellitus without complication (HCC)    Hypertension    Rectal cancer (HCC)    Stroke Paul Bond)     SURGICAL HISTORY: Past Surgical History:   Procedure Laterality Date   AV FISTULA PLACEMENT Left 12/03/2022   Procedure: LEFT ARM ARTERIOVENOUS FISTULA CREATION;  Surgeon: Paul Sparrow, MD;  Location: [facility] OR;  Service: Vascular;  Laterality: Left;   BASCILIC VEIN TRANSPOSITION Left 01/28/2023   Procedure: LEFT ARM SECOND STAGE BASILIC VEIN TRANSPOSITION;  Surgeon: Paul Sparrow, MD;  Location[facility] OR;  Service: Vascular;  Laterality: Left;   BIOPSY  02/18/2020   Procedure: BIOPSY;  Surgeon: Paul Deeds, MD;  Location: [facility] ENDOSCOPY;  Service: Gastroenterology;;   COLON SURGERY     COLONOSCOPY  02/18/2020   COLONOSCOPY WITH PROPOFOL N/A 02/18/2020   Procedure: COLONOSCOPY WITH PROPOFOL;  Surgeon: Paul Deeds, MD;  Location: [facility] ENDOSCOPY;  Service: Gastroenterology;  Laterality: N/A;   CYSTOSCOPY W/ URETERAL STENT PLACEMENT Right 02/03/2020   Procedure: CYSTOSCOPY WITH RETROGRADE PYELOGRAM/URETERAL DOUBLE J STENT PLACEMENT;  Surgeon: Paul Matar, MD;  Location: [facility] OR;  Service: Urology;  Laterality: Right;   ESOPHAGOGASTRODUODENOSCOPY (EGD) WITH PROPOFOL N/A 02/18/2020   Procedure: ESOPHAGOGASTRODUODENOSCOPY (EGD) WITH PROPOFOL;  Surgeon: Paul Deeds, MD;  Location: [facility] ENDOSCOPY;  Service: Gastroenterology;  Laterality: N/A;   IR FLUORO GUIDE CV LINE RIGHT  02/14/2022   IR US GUIDE VASC ACCESS RIGHT  02/14/2022   POLYPECTOMY  02/18/2020   Procedure: POLYPECTOMY;  Surgeon: Paul Deeds, MD;  [facility];  Service: Gastroenterology;;    SOCIAL HISTORY: Social History   Socioeconomic History   Marital status: Married  Spouse name: Not on file   Number of children: Not on file   Years of education: Not on file   Highest education level: Not on file  Occupational History   Occupation: retired  Tobacco Use   Smoking status: Former    Current packs/day: 0.00    Average packs/day: 1 pack/day for 15.0 years (15.0 ttl pk-yrs)    Types: Cigarettes    Start date: 77    Quit date: 75     Years since quitting: 50.7   Smokeless tobacco: Never  Vaping Use   Vaping status: Never Used  Substance and Sexual Activity   Alcohol use: Not Currently    Comment: quit in 1974, binge drinking prior   Drug use: Never   Sexual activity: Not on file  Other Topics Concern   Not on file  Social History Narrative   Not on file   Social Determinants of Health   Financial Resource Strain: Not on file  Food Insecurity: No Food Insecurity (02/14/2022)   Hunger Vital Sign    Worried About Running Out of Food in the Last Year: Never true    Ran Out of Food in the Last Year: Never true  Transportation Needs: No Transportation Needs (02/14/2022)   PRAPARE - Administrator, Civil Service (Medical): No    Lack of Transportation (Non-Medical): No  Physical Activity: Not on file  Stress: Not on file  Social Connections: Not on file  Intimate Partner Violence: Not At Risk (02/14/2022)   Humiliation, Afraid, Rape, and Kick questionnaire    Fear of Current or Ex-Partner: No    Emotionally Abused: No    Physically Abused: No    Sexually Abused: No    FAMILY HISTORY: No family history on file.  ALLERGIES:  is allergic to lisinopril and semaglutide.  MEDICATIONS:  Current Outpatient Medications  Medication Sig Dispense Refill   cinacalcet (SENSIPAR) 30 MG tablet Take 30 mg by mouth 3 (three) times a week. Tues, Thurs, and Sat     Ensure (ENSURE) Take 237 mLs by mouth 2 (two) times daily.     glucose 4 GM chewable tablet Chew 4 tablets by mouth daily as needed for low blood sugar.  for low blood sugar, repeat every 15 minutes if blood sugar is less than 70     insulin glargine (LANTUS) 100 UNIT/ML injection Inject 8 Units into the skin daily.     latanoprost (XALATAN) 0.005 % ophthalmic solution Place 1 drop into both eyes every evening.     sevelamer carbonate (RENVELA) 800 MG tablet Take 800 mg by mouth 3 (three) times daily before meals.     traMADol (ULTRAM) 50 MG tablet Take 1  tablet (50 mg total) by mouth every 6 (six) hours as needed. 20 tablet 0   UNABLE TO FIND Take 1 fluid ounce by mouth daily. Med Name: Maree Erie   food supplement     No current facility-administered medications for this visit.    REVIEW OF SYSTEMS:   Constitutional: ( - ) fevers, ( - )  chills , ( - ) night sweats Eyes: ( - ) blurriness of vision, ( - ) double vision, ( - ) watery eyes Ears, nose, mouth, throat, and face: ( - ) mucositis, ( - ) sore throat Respiratory: ( - ) cough, ( - ) dyspnea, ( - ) wheezes Cardiovascular: ( - ) palpitation, ( - ) chest discomfort, ( - ) lower extremity swelling Gastrointestinal:  ( - )  nausea, ( - ) heartburn, ( - ) change in bowel habits Skin: ( - ) abnormal skin rashes Lymphatics: ( - ) new lymphadenopathy, ( - ) easy bruising Neurological: ( - ) numbness, ( - ) tingling, ( - ) new weaknesses Behavioral/Psych: ( - ) mood change, ( - ) new changes  All other systems were reviewed with the patient and are negative.  PHYSICAL EXAMINATION:  Vitals:   02/27/23 1023  BP: 112/68  Pulse: 88  Resp: 15  Temp: 97.7 F (36.5 C)  SpO2: 100%    Filed Weights   02/27/23 1023  Weight: 142 lb 3.2 oz (64.5 kg)     GENERAL: Well-appearing elderly African-American male alert, no distress and comfortable SKIN: skin color, texture, turgor are normal, no rashes or significant lesions EYES: conjunctiva are pink and non-injected, sclera clear LUNGS: clear to auscultation and percussion with normal breathing effort HEART: regular rate & rhythm and no murmurs and no lower extremity edema Musculoskeletal: no cyanosis of digits and no clubbing  PSYCH: alert & oriented x 3, fluent speech NEURO: no focal motor/sensory deficits  LABORATORY DATA:  I have reviewed the data as listed    Latest Ref Rng & Units 02/27/2023    9:58 AM 01/28/2023    9:48 AM 12/03/2022    9:48 AM  CBC  WBC 4.0 - 10.5 K/uL 6.5     Hemoglobin 13.0 - 17.0 g/dL 9.4  9.5   16.1   Hematocrit 39.0 - 52.0 % 28.2  28.0  31.0   Platelets 150 - 400 K/uL 229          Latest Ref Rng & Units 02/27/2023    9:58 AM 01/28/2023    9:48 AM 12/03/2022    9:48 AM  CMP  Glucose 70 - 99 mg/dL 096  045  409   BUN 8 - 23 mg/dL 45  70  65   Creatinine 0.61 - 1.24 mg/dL 8.11  9.14  7.82   Sodium 135 - 145 mmol/L 132  133  132   Potassium 3.5 - 5.1 mmol/L 5.5  4.9  4.6   Chloride 98 - 111 mmol/L 90  95  96   CO2 22 - 32 mmol/L 29     Calcium 8.9 - 10.3 mg/dL 8.4     Total Protein 6.5 - 8.1 g/dL 8.0     Total Bilirubin 0.3 - 1.2 mg/dL 0.5     Alkaline Phos 38 - 126 U/L 102     AST 15 - 41 U/L 14     ALT 0 - 44 U/L 7       Lab Results  Component Value Date   MPROTEIN 0.5 (H) 02/27/2023   MPROTEIN 0.3 (H) 08/03/2022   MPROTEIN 0.4 (H) 10/11/2021   Lab Results  Component Value Date   KPAFRELGTCHN 178.0 (H) 02/27/2023   KPAFRELGTCHN 217.6 (H) 08/03/2022   KPAFRELGTCHN 125.0 (H) 12/21/2021   LAMBDASER 136.2 (H) 02/27/2023   LAMBDASER 146.0 (H) 08/03/2022   LAMBDASER 69.2 (H) 12/21/2021   KAPLAMBRATIO 1.31 02/27/2023   KAPLAMBRATIO 1.49 08/03/2022   KAPLAMBRATIO 4.48 12/28/2021    RADIOGRAPHIC STUDIES: No results found.  ASSESSMENT & PLAN RANDY WHITUS is a 82 y.o. male with medical history significant for an IgM monoclonal gammopathy and anemia of chronic disease who presents for a follow up visit.   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 Patient  does not wish to pursue a third bone marrow biopsy. --At this time we have low clinical suspicion for multiple myeloma. --Okay to proceed with erythropoietin shots per the [place]. --Bone survey from 12/28/2021 showed no evidence of lytic lesions.  --Labs today show white blood cell count 6.5, hemoglobin 9.4, MCV 94.9, platelets 229. 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 Patient is on HD 3x/week. SPEP/IFE and sFLC pending --If  there is signs of transformation to multiple myeloma based on today's labs, we will discuss if repeat bone marrow biopsy is needed.  --RTC in 6 months for repeat clinic visit with interval EPO shots at the [place].   #Weight loss: --Patient did undergo CT imaging on 07/05/2022 to rule out recurrence of rectal cancer. There is mild prominence of the main pancreatic duct with punctate hypodensities scattered throughout. Additionally, increased edema along the presacral space appears increased from prior study.  --[facility]   No orders of the defined types were placed in this encounter.   All questions were answered. The patient knows to call the clinic with any problems, questions or concerns.  A total of more than 30 minutes were spent on this encounter with face-to-face time and non-face-to-face time, including preparing to see the patient, ordering tests and/or medications, counseling the patient and coordination of care as outlined above.  Ulysees Barns, MD Department of Hematology/Oncology [facility] Phone: [phone] Pager: [phone] Email: [email or web address]    03/03/2023 6:37 PM

## 2023-02-27 ENCOUNTER — Inpatient Hospital Stay (HOSPITAL_BASED_OUTPATIENT_CLINIC_OR_DEPARTMENT_OTHER): Payer: Medicare Other | Admitting: Hematology and Oncology

## 2023-02-27 ENCOUNTER — Inpatient Hospital Stay: Payer: Medicare Other | Attending: Hematology and Oncology

## 2023-02-27 VITALS — BP 112/68 | HR 88 | Temp 97.7°F | Resp 15 | Wt 142.2 lb

## 2023-02-27 DIAGNOSIS — R634 Abnormal weight loss: Secondary | ICD-10-CM | POA: Insufficient documentation

## 2023-02-27 DIAGNOSIS — Z87891 Personal history of nicotine dependence: Secondary | ICD-10-CM | POA: Insufficient documentation

## 2023-02-27 DIAGNOSIS — D649 Anemia, unspecified: Secondary | ICD-10-CM | POA: Diagnosis not present

## 2023-02-27 DIAGNOSIS — N185 Chronic kidney disease, stage 5: Secondary | ICD-10-CM | POA: Insufficient documentation

## 2023-02-27 DIAGNOSIS — D638 Anemia in other chronic diseases classified elsewhere: Secondary | ICD-10-CM | POA: Insufficient documentation

## 2023-02-27 DIAGNOSIS — D472 Monoclonal gammopathy: Secondary | ICD-10-CM

## 2023-02-27 LAB — CBC WITH DIFFERENTIAL (CANCER CENTER ONLY)
Abs Immature Granulocytes: 0.03 10*3/uL (ref 0.00–0.07)
Basophils Absolute: 0 10*3/uL (ref 0.0–0.1)
Basophils Relative: 1 %
Eosinophils Absolute: 0.3 10*3/uL (ref 0.0–0.5)
Eosinophils Relative: 4 %
HCT: 28.2 % — ABNORMAL LOW (ref 39.0–52.0)
Hemoglobin: 9.4 g/dL — ABNORMAL LOW (ref 13.0–17.0)
Immature Granulocytes: 1 %
Lymphocytes Relative: 30 %
Lymphs Abs: 1.9 10*3/uL (ref 0.7–4.0)
MCH: 31.6 pg (ref 26.0–34.0)
MCHC: 33.3 g/dL (ref 30.0–36.0)
MCV: 94.9 fL (ref 80.0–100.0)
Monocytes Absolute: 0.8 10*3/uL (ref 0.1–1.0)
Monocytes Relative: 13 %
Neutro Abs: 3.4 10*3/uL (ref 1.7–7.7)
Neutrophils Relative %: 51 %
Platelet Count: 229 10*3/uL (ref 150–400)
RBC: 2.97 MIL/uL — ABNORMAL LOW (ref 4.22–5.81)
RDW: 12.8 % (ref 11.5–15.5)
WBC Count: 6.5 10*3/uL (ref 4.0–10.5)
nRBC: 0 % (ref 0.0–0.2)

## 2023-02-27 LAB — CMP (CANCER CENTER ONLY)
ALT: 7 U/L (ref 0–44)
AST: 14 U/L — ABNORMAL LOW (ref 15–41)
Albumin: 4.1 g/dL (ref 3.5–5.0)
Alkaline Phosphatase: 102 U/L (ref 38–126)
Anion gap: 13 (ref 5–15)
BUN: 45 mg/dL — ABNORMAL HIGH (ref 8–23)
CO2: 29 mmol/L (ref 22–32)
Calcium: 8.4 mg/dL — ABNORMAL LOW (ref 8.9–10.3)
Chloride: 90 mmol/L — ABNORMAL LOW (ref 98–111)
Creatinine: 6.82 mg/dL — ABNORMAL HIGH (ref 0.61–1.24)
GFR, Estimated: 8 mL/min — ABNORMAL LOW (ref >60)
Glucose, Bld: 214 mg/dL — ABNORMAL HIGH (ref 70–99)
Potassium: 5.5 mmol/L — ABNORMAL HIGH (ref 3.5–5.1)
Sodium: 132 mmol/L — ABNORMAL LOW (ref 135–145)
Total Bilirubin: 0.5 mg/dL (ref 0.3–1.2)
Total Protein: 8 g/dL (ref 6.5–8.1)

## 2023-02-27 LAB — LACTATE DEHYDROGENASE: LDH: 134 U/L (ref 98–192)

## 2023-02-28 LAB — KAPPA/LAMBDA LIGHT CHAINS
Kappa free light chain: 178 mg/L — ABNORMAL HIGH (ref 3.3–19.4)
Kappa, lambda light chain ratio: 1.31 (ref 0.26–1.65)
Lambda free light chains: 136.2 mg/L — ABNORMAL HIGH (ref 5.7–26.3)

## 2023-03-03 LAB — MULTIPLE MYELOMA PANEL, SERUM
Albumin SerPl Elph-Mcnc: 3.8 g/dL (ref 2.9–4.4)
Albumin/Glob SerPl: 1.1 (ref 0.7–1.7)
Alpha 1: 0.2 g/dL (ref 0.0–0.4)
Alpha2 Glob SerPl Elph-Mcnc: 1 g/dL (ref 0.4–1.0)
B-Globulin SerPl Elph-Mcnc: 1 g/dL (ref 0.7–1.3)
Gamma Glob SerPl Elph-Mcnc: 1.5 g/dL (ref 0.4–1.8)
Globulin, Total: 3.7 g/dL (ref 2.2–3.9)
IgA: 372 mg/dL (ref 61–437)
IgG (Immunoglobin G), Serum: 1206 mg/dL (ref 603–1613)
IgM (Immunoglobulin M), Srm: 447 mg/dL — ABNORMAL HIGH (ref 15–143)
M Protein SerPl Elph-Mcnc: 0.5 g/dL — ABNORMAL HIGH
Total Protein ELP: 7.5 g/dL (ref 6.0–8.5)

## 2023-03-14 ENCOUNTER — Ambulatory Visit (INDEPENDENT_AMBULATORY_CARE_PROVIDER_SITE_OTHER): Payer: No Typology Code available for payment source | Admitting: Physician Assistant

## 2023-03-14 VITALS — BP 115/63 | HR 95 | Temp 97.7°F | Resp 20 | Ht 67.0 in | Wt 145.0 lb

## 2023-03-14 DIAGNOSIS — N186 End stage renal disease: Secondary | ICD-10-CM

## 2023-03-14 NOTE — Progress Notes (Signed)
    Postoperative Access Visit   History of Present Illness   Paul Bond is a 82 y.o. year old male who presents for postoperative follow-up for: left second stage basilic vein transposition (Date: 01/25/23).  The patient's wounds are healed.  The patient denies steal symptoms.  The patient is able to complete their activities of daily living.  He is currently dialyzing via right IJ [facility] on a Tuesday Thursday Saturday schedule at the [facility] dialysis center.  Prior access surgery includes left forearm loop graft which is known to be occluded.   Physical Examination   Vitals:   03/14/23 1332  BP: 115/63  Pulse: 95  Resp: 20  Temp: 97.7 F (36.5 C)  TempSrc: Temporal  SpO2: 98%  Weight: 145 lb (65.8 kg)  Height: 5\' 7"  (1.702 m)   Body mass index is 22.71 kg/m.  left arm Incisions are healed, palpable radial pulse, hand grip is 5/5, sensation in digits is intact, palpable thrill, bruit can be auscultated     Medical Decision Making   Paul Bond is a 82 y.o. year old male who presents s/p left second stage basilic vein transposition  Patent basilic vein fistula without signs or symptoms of steal syndrome The patient's access will be ready for use 03/19/23 The patient's tunneled dialysis catheter can be removed when Nephrology is comfortable with the performance of the fistula The patient may follow up on a prn basis   Emilie Rutter PA-C [facility] Office: [phone]  Clinic MD: Karin Lieu

## 2023-05-22 ENCOUNTER — Other Ambulatory Visit: Payer: Self-pay

## 2023-05-22 ENCOUNTER — Ambulatory Visit (HOSPITAL_COMMUNITY)
Admission: RE | Admit: 2023-05-22 | Discharge: 2023-05-22 | Disposition: A | Discharge status: Home / Self Care | Payer: No Typology Code available for payment source | Attending: Nephrology | Admitting: Nephrology

## 2023-05-22 ENCOUNTER — Encounter (HOSPITAL_COMMUNITY)
Admission: RE | Disposition: A | Discharge status: Home / Self Care | Payer: Self-pay | Source: Home / Self Care | Attending: Nephrology

## 2023-05-22 DIAGNOSIS — Z87891 Personal history of nicotine dependence: Secondary | ICD-10-CM | POA: Diagnosis not present

## 2023-05-22 DIAGNOSIS — N250 Renal osteodystrophy: Secondary | ICD-10-CM | POA: Diagnosis not present

## 2023-05-22 DIAGNOSIS — E1122 Type 2 diabetes mellitus with diabetic chronic kidney disease: Secondary | ICD-10-CM | POA: Diagnosis not present

## 2023-05-22 DIAGNOSIS — I120 Hypertensive chronic kidney disease with stage 5 chronic kidney disease or end stage renal disease: Secondary | ICD-10-CM | POA: Diagnosis not present

## 2023-05-22 DIAGNOSIS — N186 End stage renal disease: Secondary | ICD-10-CM | POA: Diagnosis not present

## 2023-05-22 DIAGNOSIS — Z992 Dependence on renal dialysis: Secondary | ICD-10-CM | POA: Diagnosis not present

## 2023-05-22 DIAGNOSIS — D649 Anemia, unspecified: Secondary | ICD-10-CM | POA: Insufficient documentation

## 2023-05-22 DIAGNOSIS — Z452 Encounter for adjustment and management of vascular access device: Secondary | ICD-10-CM | POA: Diagnosis present

## 2023-05-22 HISTORY — PX: DIALYSIS/PERMA CATHETER REMOVAL: CATH118289

## 2023-05-22 LAB — POCT I-STAT, CHEM 8
BUN: 57 mg/dL — ABNORMAL HIGH (ref 8–23)
Calcium, Ion: 1.01 mmol/L — ABNORMAL LOW (ref 1.15–1.40)
Chloride: 93 mmol/L — ABNORMAL LOW (ref 98–111)
Creatinine, Ser: 8.4 mg/dL — ABNORMAL HIGH (ref 0.61–1.24)
Glucose, Bld: 260 mg/dL — ABNORMAL HIGH (ref 70–99)
HCT: 26 % — ABNORMAL LOW (ref 39.0–52.0)
Hemoglobin: 8.8 g/dL — ABNORMAL LOW (ref 13.0–17.0)
Potassium: 5 mmol/L (ref 3.5–5.1)
Sodium: 134 mmol/L — ABNORMAL LOW (ref 135–145)
TCO2: 29 mmol/L (ref 22–32)

## 2023-05-22 LAB — GLUCOSE, CAPILLARY: Glucose-Capillary: 207 mg/dL — ABNORMAL HIGH (ref 70–99)

## 2023-05-22 SURGERY — DIALYSIS/PERMA CATHETER REMOVAL

## 2023-05-22 MED ORDER — LIDOCAINE HCL (PF) 1 % IJ SOLN
INTRAMUSCULAR | Status: AC
  Filled 2023-05-22: qty 30

## 2023-05-22 SURGICAL SUPPLY — 1 items: TRAY PV CATH (CUSTOM PROCEDURE TRAY) IMPLANT

## 2023-05-22 NOTE — H&P (Addendum)
Chief Complaint: Decreased flows  Interval H&P   The patient has presented today for tunneled catheter removal and that he has a functioning left upper arm brachiobasilic fistula.  Patient dialyzes at [facility] with last treatment yesterday.   Various methods of treatment have been discussed with the patient.  After consideration of risk, benefits and other options for treatment, the patient has consented to a tunneled catheter removal.   Risks  of bleeding, pain, infection, nonhealing wound, lung and carotid artery injury were explained to the patient.  The patient's history has been reviewed and the patient has been examined, no changes in status.  Stable for tunneled catheter insertion.  I have reviewed the patient's chart and labs.  Questions were answered to the patient's satisfaction.  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 Catheter no longer needed, functioning left upper arm access with no issues; he had a left upper arm brachiobasilic fistula placed on December 03, 2022 by Dr. Karin Lieu and then subsequently transposed on January 28, 2023.Marland Kitchen  Planning a right IJ tunnel catheter removal; current catheter was exchanged on February 14, 2022. Renal osteodystrophy - continue binders per home regimen. Anemia - managed with ESA's and IV iron at dialysis center. HTN - resume home regimen.   HPI: Paul Bond is an 82 y.o. male DM 2, HTN, CVA, ESRD on HD Tuesday Thursdays and Saturdays at the [place] in [place] with his last treatment being on Tuesday.  Patient is here for a tunneled catheter removal from the right internal jugular; his left upper arm brachiobasilic fistula is functioning well with no issues and his last treatment was through the fistula yesterday.  Pt denies fever, chills, nausea, vomiting, myalgias, SOB, CP.   ROS Per HPI.  Chemistry and CBC: Creatinine  Date/Time Value Ref Range Status  02/27/2023 09:58 AM 6.82 (H)  0.61 - 1.24 mg/dL Final  09/81/1914 78:29 AM 4.79 (H) 0.61 - 1.24 mg/dL Final  56/21/3086 57:84 PM 5.76 (HH) 0.61 - 1.24 mg/dL Final    Comment:    CRITICAL RESULT CALLED TO, READ BACK BY AND VERIFIED WITH: carrie wilson at [id number]    Creatinine, Ser  Date/Time Value Ref Range Status  01/28/2023 09:48 AM 9.10 (H) 0.61 - 1.24 mg/dL Final  69/62/9528 41:32 AM 7.70 (H) 0.61 - 1.24 mg/dL Final  44/06/270 53:66 PM 5.53 (H) 0.61 - 1.24 mg/dL Final  44/08/4740 59:56 AM 6.17 (H) 0.61 - 1.24 mg/dL Final  38/75/6433 29:51 AM 8.19 (H) 0.61 - 1.24 mg/dL Final  88/41/6606 30:16 PM 8.68 (H) 0.61 - 1.24 mg/dL Final  06/19/3233 57:32 AM 5.37 (H) 0.61 - 1.24 mg/dL Final  20/25/4270 62:37 AM 5.37 (H) 0.61 - 1.24 mg/dL Final  62/83/1517 61:60 PM 5.93 (H) 0.61 - 1.24 mg/dL Final  73/71/0626 94:85 PM 5.37 (H) 0.61 - 1.24 mg/dL Final  46/27/0350 09:38 AM 5.73 (H) 0.61 - 1.24 mg/dL Final  18/29/9371 69:67 AM 5.46 (H) 0.61 - 1.24 mg/dL Final  89/38/1017 51:02 AM 5.43 (H) 0.61 - 1.24 mg/dL Final  58/52/7782 42:35 AM 5.41 (H) 0.61 - 1.24 mg/dL Final  36/14/4315 40:08 AM 6.51 (H) 0.61 - 1.24 mg/dL Final  67/61/9509 32:67 AM 7.04 (H) 0.61 - 1.24 mg/dL Final  12/45/8099 83:38 AM 4.78 (H) 0.61 - 1.24 mg/dL Final  25/10/3974 73:41 AM 5.11 (H) 0.61 - 1.24 mg/dL Final  93/79/0240 97:35 AM 5.04 (H) 0.61 - 1.24 mg/dL Final  32/99/2426 83:41 AM 5.09 (H) 0.61 - 1.24 mg/dL  Final  02/17/2020 04:35 AM 5.30 (H) 0.61 - 1.24 mg/dL Final  16/03/9603 54:09 AM 5.37 (H) 0.61 - 1.24 mg/dL Final  81/19/1478 29:56 AM 5.86 (H) 0.61 - 1.24 mg/dL Final  21/30/8657 84:69 AM 6.05 (H) 0.61 - 1.24 mg/dL Final  62/95/2841 32:44 PM 6.10 (H) 0.61 - 1.24 mg/dL Final  06/13/7251 66:44 AM 5.90 (H) 0.61 - 1.24 mg/dL Final  03/47/4259 56:38 AM 6.03 (H) 0.61 - 1.24 mg/dL Final  75/64/3329 51:88 PM 6.05 (H) 0.61 - 1.24 mg/dL Final  41/66/0630 16:01 AM 5.26 (H) 0.61 - 1.24 mg/dL Final  09/32/3557 32:20 AM 5.04 (H) 0.61 - 1.24 mg/dL Final   25/42/7062 37:62 AM 5.13 (H) 0.61 - 1.24 mg/dL Final  83/15/1761 60:73 AM 5.56 (H) 0.61 - 1.24 mg/dL Final  71/11/2692 85:46 AM 6.50 (H) 0.61 - 1.24 mg/dL Final  27/08/5007 38:18 AM 7.81 (H) 0.61 - 1.24 mg/dL Final  29/93/7169 67:89 AM 9.40 (H) 0.61 - 1.24 mg/dL Final  38/03/1750 02:58 AM 12.91 (H) 0.61 - 1.24 mg/dL Final  52/77/8242 35:36 AM 15.33 (H) 0.61 - 1.24 mg/dL Final  14/43/1540 08:67 PM 16.99 (H) 0.61 - 1.24 mg/dL Final  61/95/0932 67:12 AM 19.19 (H) 0.61 - 1.24 mg/dL Final  45/80/9983 38:25 AM 24.28 (H) 0.61 - 1.24 mg/dL Final  05/39/7673 41:93 PM 26.09 (H) 0.61 - 1.24 mg/dL Final    Comment:    RESULTS CONFIRMED BY MANUAL DILUTION  02/02/2020 07:20 PM 25.45 (H) 0.61 - 1.24 mg/dL Final    Comment:    RESULTS CONFIRMED BY MANUAL DILUTION  01/20/2020 07:09 PM 18.89 (H) 0.61 - 1.24 mg/dL Final  79/07/4095 35:32 AM 1.36  Final  01/22/2008 05:20 AM 1.40  Final  01/21/2008 09:25 PM 1.6 (H)  Final   No results for input(s): "NA", "K", "CL", "CO2", "GLUCOSE", "BUN", "CREATININE", "CALCIUM", "PHOS" in the last 168 hours.  Invalid input(s): "ALB" No results for input(s): "WBC", "NEUTROABS", "HGB", "HCT", "MCV", "PLT" in the last 168 hours. Liver Function Tests: No results for input(s): "AST", "ALT", "ALKPHOS", "BILITOT", "PROT", "ALBUMIN" in the last 168 hours. No results for input(s): "LIPASE", "AMYLASE" in the last 168 hours. No results for input(s): "AMMONIA" in the last 168 hours. Cardiac Enzymes: No results for input(s): "CKTOTAL", "CKMB", "CKMBINDEX", "TROPONINI" in the last 168 hours. Iron Studies: No results for input(s): "IRON", "TIBC", "TRANSFERRIN", "FERRITIN" in the last 72 hours. PT/INR: @LABRCNTIP (inr:5)  Xrays/Other Studies: )No results found for this or any previous visit (from the past 48 hour(s)). No results found.  PMH:   Past Medical History:  Diagnosis Date   Chronic indwelling Foley catheter    Chronic kidney disease    Diabetes mellitus without  complication (HCC)    Hypertension    Rectal cancer (HCC)    Stroke (HCC)     PSH:   Past Surgical History:  Procedure Laterality Date   AV FISTULA PLACEMENT Left 12/03/2022   Procedure: LEFT ARM ARTERIOVENOUS FISTULA CREATION;  Surgeon: Victorino Sparrow, MD;  Location: [facility] OR;  Service: Vascular;  Laterality: Left;   BASCILIC VEIN TRANSPOSITION Left 01/28/2023   Procedure: LEFT ARM SECOND STAGE BASILIC VEIN TRANSPOSITION;  Surgeon: Victorino Sparrow, MD;  Location: [facility] OR;  Service: Vascular;  Laterality: Left;   BIOPSY  02/18/2020   Procedure: BIOPSY;  Surgeon: Benancio Deeds, MD;  Location: [facility] ENDOSCOPY;  Service: Gastroenterology;;   COLON SURGERY     COLONOSCOPY  02/18/2020   COLONOSCOPY WITH PROPOFOL N/A 02/18/2020   Procedure: COLONOSCOPY WITH  PROPOFOL;  Surgeon: Benancio Deeds, MD;  Location[facility] ENDOSCOPY;  Service: Gastroenterology;  Laterality: N/A;   CYSTOSCOPY W/ URETERAL STENT PLACEMENT Right 02/03/2020   Procedure: CYSTOSCOPY WITH RETROGRADE PYELOGRAM/URETERAL DOUBLE J STENT PLACEMENT;  Surgeon: Marcine Matar, MD;  Location: [facility] OR;  Service: Urology;  Laterality: Right;   ESOPHAGOGASTRODUODENOSCOPY (EGD) WITH PROPOFOL N/A 02/18/2020   Procedure: ESOPHAGOGASTRODUODENOSCOPY (EGD) WITH PROPOFOL;  Surgeon: Benancio Deeds, MD;  Location: [facility] ENDOSCOPY;  Service: Gastroenterology;  Laterality: N/A;   IR FLUORO GUIDE CV LINE RIGHT  02/14/2022   IR US GUIDE VASC ACCESS RIGHT  02/14/2022   POLYPECTOMY  02/18/2020   Procedure: POLYPECTOMY;  Surgeon: Benancio Deeds, MD;  Location: [facility] ENDOSCOPY;  Service: Gastroenterology;;    Allergies:  Allergies  Allergen Reactions   Lisinopril Cough   Semaglutide Other (See Comments)    Indigestion  *Ozempic    Medications:   Prior to Admission medications   Medication Sig Start Date End Date Taking? Authorizing Provider  cinacalcet (SENSIPAR) 30 MG tablet Take 30 mg by mouth daily. Tues, Thurs, and Sat    [provider]  Ensure (ENSURE) Take 237 mLs by mouth 2 (two) times daily.    [provider]  glucose 4 GM chewable tablet Chew 4 tablets by mouth daily as needed for low blood sugar.  for low blood sugar, repeat every 15 minutes if blood sugar is less than 70    [provider]  insulin glargine (LANTUS) 100 UNIT/ML injection Inject 8 Units into the skin daily.    [provider]  latanoprost (XALATAN) 0.005 % ophthalmic solution Place 1 drop into both eyes every evening.    [provider]  sevelamer carbonate (RENVELA) 800 MG tablet Take 800 mg by mouth 3 (three) times daily before meals.    [provider]  traMADol (ULTRAM) 50 MG tablet Take 1 tablet (50 mg total) by mouth every 6 (six) hours as needed. 01/28/23 01/28/24  Lars Mage, PA-C  UNABLE TO FIND Take 1 fluid ounce by mouth daily. Med Name: Maree Erie   food supplement    [provider]    Discontinued Meds:  There are no discontinued medications.  Social History:  reports that he quit smoking about 50 years ago. His smoking use included cigarettes. He started smoking about 65 years ago. He has a 15 pack-year smoking history. He has never used smokeless tobacco. He reports that he does not currently use alcohol. He reports that he does not use drugs.  Family History:  No family history on file.  There were no vitals taken for this visit. GEN: NAD, A&Ox3, NCAT HEENT: No conjunctival pallor, EOMI NECK: Supple, no thyromegaly LUNGS: CTA B/L no rales, rhonchi or wheezing CV: RRR, No M/R/G ABD: SNDNT +BS  EXT: No lower extremity edema ACCESS: Upper arm brachiobasilic fistula, right IJ tunnel catheter        Jakyri Brunkhorst, Len Blalock, MD 05/22/2023, 11:34 AM

## 2023-05-22 NOTE — Discharge Instructions (Signed)
General care instructions: - Do not drive or operate heavy machinery for 24hrs - Avoid making any important decisions for the remainder of the day. - You should be able to eat, drink, and resume your normal medications. - Avoid any strenuous activity for the remainder of the day. Potential complications: - You are bleeding at the exit or venotomy site and it will not stop with direct pressure; if there is a slow ooze apply pressure over the venotomy site neck region where the catheter can be felt for 5 minutes.  - You have a fever, swelling, see redness or feel heat over the tunnel or exit site. 3.  Medication instructions: - Continue routine medications unless otherwise instructed.

## 2023-05-22 NOTE — Op Note (Signed)
Patient presents with a RIJ TC which is no longer needed and referred for removal.   Summary:  1) Patient had successful removal of a  Palindrome hemodialysis catheter  Description of procedure: The  right neck + chest  area and the catheter were prepped and draped in the usual sterile fashion. The exit site and adjacent tunnel tract were anesthetized with lidocaine 1% with epinephrine. The cuff was free by blunt + sharp dissection. The catheter was then retracted, inspected, and noted to be intact with cuff removed as well. Hemostasis was obtained via manual pressure. Sterile dressings were placed and the patient returned to recovery in stable condition.  Sedation: Please see the pre-op and intra-op nursing notes for total doses.  Monitoring: Because of the patient's comorbid conditions and sedation during the procedure, continuous EKG monitoring and O2 saturation monitoring was performed throughout the procedure by the RN. There were no abnormal arrhythmias encountered.  Complications: None.  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   Report any issues with discharge or bleeding to [facility] ([facility]).  Discharge: The patient was discharged home in stable condition. The patient was given education regarding the care of the catheter and specific instructions in case of any problems

## 2023-05-23 ENCOUNTER — Encounter (HOSPITAL_COMMUNITY): Payer: Self-pay | Admitting: Nephrology

## 2023-05-31 ENCOUNTER — Other Ambulatory Visit: Payer: Self-pay

## 2023-05-31 ENCOUNTER — Ambulatory Visit (HOSPITAL_COMMUNITY)
Admission: RE | Admit: 2023-05-31 | Discharge: 2023-05-31 | Disposition: A | Discharge status: Home / Self Care | Payer: No Typology Code available for payment source | Attending: Nephrology | Admitting: Nephrology

## 2023-05-31 ENCOUNTER — Encounter (HOSPITAL_COMMUNITY)
Admission: RE | Disposition: A | Discharge status: Home / Self Care | Payer: Self-pay | Source: Home / Self Care | Attending: Nephrology

## 2023-05-31 DIAGNOSIS — T82858A Stenosis of vascular prosthetic devices, implants and grafts, initial encounter: Secondary | ICD-10-CM | POA: Diagnosis present

## 2023-05-31 DIAGNOSIS — N186 End stage renal disease: Secondary | ICD-10-CM | POA: Diagnosis not present

## 2023-05-31 DIAGNOSIS — Z992 Dependence on renal dialysis: Secondary | ICD-10-CM | POA: Diagnosis not present

## 2023-05-31 DIAGNOSIS — Y832 Surgical operation with anastomosis, bypass or graft as the cause of abnormal reaction of the patient, or of later complication, without mention of misadventure at the time of the procedure: Secondary | ICD-10-CM | POA: Insufficient documentation

## 2023-05-31 HISTORY — PX: PERIPHERAL VASCULAR BALLOON ANGIOPLASTY: CATH118281

## 2023-05-31 HISTORY — PX: A/V FISTULAGRAM: CATH118298

## 2023-05-31 SURGERY — A/V FISTULAGRAM
Anesthesia: LOCAL | Laterality: Left

## 2023-05-31 MED ORDER — HEPARIN (PORCINE) IN NACL 1000-0.9 UT/500ML-% IV SOLN
INTRAVENOUS | Status: DC | PRN
  Administered 2023-05-31: 500 mL

## 2023-05-31 MED ORDER — MIDAZOLAM HCL 2 MG/2ML IJ SOLN
INTRAMUSCULAR | Status: AC
  Filled 2023-05-31: qty 2

## 2023-05-31 MED ORDER — MIDAZOLAM HCL 2 MG/2ML IJ SOLN
INTRAMUSCULAR | Status: DC | PRN
  Administered 2023-05-31: .5 mg via INTRAVENOUS

## 2023-05-31 MED ORDER — LIDOCAINE HCL (PF) 1 % IJ SOLN
INTRAMUSCULAR | Status: AC
  Filled 2023-05-31: qty 30

## 2023-05-31 MED ORDER — FENTANYL CITRATE (PF) 100 MCG/2ML IJ SOLN
INTRAMUSCULAR | Status: DC | PRN
  Administered 2023-05-31: 25 ug via INTRAVENOUS

## 2023-05-31 MED ORDER — FENTANYL CITRATE (PF) 100 MCG/2ML IJ SOLN
INTRAMUSCULAR | Status: AC
  Filled 2023-05-31: qty 2

## 2023-05-31 MED ORDER — IODIXANOL 320 MG/ML IV SOLN
INTRAVENOUS | Status: DC | PRN
  Administered 2023-05-31: 10 mL

## 2023-05-31 MED ORDER — LIDOCAINE HCL (PF) 1 % IJ SOLN
INTRAMUSCULAR | Status: DC | PRN
  Administered 2023-05-31 (×2): 3 mL

## 2023-05-31 SURGICAL SUPPLY — 13 items
BAG SNAP BAND KOVER 36X36 (MISCELLANEOUS) ×2 IMPLANT
BALLN ATHLETIS 7X40X75 (BALLOONS) ×2
BALLOON ATHLETIS 7X40X75 (BALLOONS) IMPLANT
CATH SLIP KMP 65CM 5FR (CATHETERS) IMPLANT
COVER DOME SNAP 22 D (MISCELLANEOUS) ×2 IMPLANT
GUIDEWIRE ANGLED .035X150CM (WIRE) IMPLANT
KIT ESSENTIALS PG (KITS) IMPLANT
SHEATH PINNACLE R/O II 6F 4CM (SHEATH) IMPLANT
SYR MEDALLION 10ML (SYRINGE) IMPLANT
SYR MEDALLION 3ML (SYRINGE) IMPLANT
TRAY PV CATH (CUSTOM PROCEDURE TRAY) ×2 IMPLANT
WIRE SPARTACORE .014X190CM (WIRE) IMPLANT
WIRE STARTER BENTSON 035X150 (WIRE) IMPLANT

## 2023-05-31 NOTE — Op Note (Signed)
Patient presents for concerns of decreased access flows in his left BBT (transposed by Dr. Karin Lieu on 01/28/2023 with the inflow end to end anastomosis).  This is first procedure on this current access.  On the physical exam, the fistula is hyperpulsatile worse the outflow .  Summary:  1)      The patient had successful angioplasty (7 mm Athletis  FE ~25 atm) of significant stenosis in the outflow basilic vein swing site.  Of note the vein tends to be spasmodic and is approximately 6 to 6-1/2 mm in diameter where it is not stenotic.  There appears to be a arterial anastomotic stenosis as well or possibly juxta on a retrograde arteriogram.  We were not able to get a wire retrograde; however, a Roadrunner likely would have gotten across.  In the future may try a V18. 2)      Centrals, axillary vein are patent; the brachial artery is healthy appearing but there appears to be a arterial anastomotic stenosis.  Need a fine floppy tip wire to cross and unfortunately there was no Roadrunner available. Flows improved after outflow angioplasty. 3)      This left BBT remains amenable to future percutaneous intervention as long as it remains patent at least 3 months.  Description of procedure: The arm was prepped and draped in the usual sterile fashion. The left upper arm brachial basilic fistula was cannulated (95638) with an 18G Angiocath needle directed in an antegrade direction in arterial limb of the fistula. A guidewire was inserted and exchanged for a 6 Fr sheath. Contrast [phone]) injection via the side port of the sheath was performed. The angiogram of the fistula (32951) showed a focal 80% stenosis in the outflow basilic swing site; the axillary vein, centrals, cannulation zone.  Appeared to be a inflow anastomosis stenosis which was not well-visualized, possible small thrombus at a valve leaflet along the juxta level.  .  A 0.035 wire was then inserted through the sheath and parked in the central veins. A 7  mm Athletis angioplasty balloon was then inserted over the guidewire and positioned at the basilic vein outflow swing site stenosis.   Venous angioplasty (88416) was carried out to 24 ATM with FULL effacement of the waist on the balloon at the basilic outflow swing site lesion. The repeat angiogram showed 10% residual stenosis at the outflow basilic swing site  with no evidence of extravasation or dissection but it tended to be spasmodic which was relieved with prolonged gentle 7 mm balloon inflation.  Of note there was a hyper pulsatile segment along the arterial limb which I could not relieve despite retracting the sheath out over a dilator.  I also tried Hydro dilation but there was either a very minimal stenosis or spasm right underneath the antegrade venotomy site.  At this point I performed a retrograde cannulation with a 18-gauge Angiocath exchanged over guidewire for a 6 French sheath.  I was only able to get a wire with a guiding catheter to the level of the juxta anastomosis and could not advance the wire past that.  I believe there is a significant arterial anastomotic stenosis however the vein itself does not appear to be large and is only approximately 6 mm where there is no stenosis in the body of the basilic vein.  Vein also along the body and outflow were very spasmodic.  Hemostasis: A 3-0 ethilon purse string suture was placed at the cannulation site on removal of the sheath.  Sedation: 0.5  mg Versed, 25 mcg Fentanyl.  Sedation time: 42 min  Contrast. 10 mL  Monitoring: Because of the patient's comorbid conditions and sedation during the procedure, continuous EKG monitoring and O2 saturation monitoring was performed throughout the procedure by the RN. There were no abnormal arrhythmias encountered.  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 Continue to cannulate the fistula with 15G needles.  2. Refer for problems with flows/swelling. 3. Remove the suture next treatment.   Discharge: The patient was discharged home in stable condition. The patient was given education regarding the care of the dialysis access AVF and specific instructions in case of any problems.

## 2023-05-31 NOTE — Discharge Instructions (Signed)

## 2023-05-31 NOTE — H&P (Signed)
Interval H&P  The patient has presented today for an angiogram/ angioplasty; patient is followed at [facility]. He just had a tunneled catheter removal on 05/22/2023 but is now being referred for decreased flows in the left upper arm brachial basilic fistula transposed on 01/28/2023 by Dr. Sherral Hammers with and an end to end inflow anastomosis after tunneling.   Various methods of treatment have been discussed with the patient.  After consideration of risk, benefits and other options for treatment, the patient has consented to a angiogram/ angioplasty with  possible stent placement.   Risks of angiogram with potential angioplasty and stenting if needed.contrast reaction, extravasation/ bleeding, dissection, hypotension and death were explained to the patient.  The patient's history has been reviewed and the patient has been examined, no changes in status.  Stable for angiogram/angioplasty  I have reviewed the patient's chart and labs.  Questions were answered to the patient's satisfaction; I also called the daughter Adolm Joseph to discuss what was being done and answered all questions to her satisfaction and she agreed to proceed.

## 2023-06-03 ENCOUNTER — Encounter (HOSPITAL_COMMUNITY): Payer: Self-pay | Admitting: Nephrology

## 2023-07-17 ENCOUNTER — Ambulatory Visit (HOSPITAL_COMMUNITY)
Admission: RE | Admit: 2023-07-17 | Payer: No Typology Code available for payment source | Source: Home / Self Care | Admitting: Nephrology

## 2023-07-17 ENCOUNTER — Encounter (HOSPITAL_COMMUNITY)
Admission: RE | Disposition: A | Discharge status: Home / Self Care | Payer: Self-pay | Source: Home / Self Care | Attending: Nephrology

## 2023-07-17 ENCOUNTER — Other Ambulatory Visit: Payer: Self-pay

## 2023-07-17 ENCOUNTER — Encounter (HOSPITAL_COMMUNITY): Admission: RE | Payer: Self-pay | Source: Home / Self Care

## 2023-07-17 ENCOUNTER — Ambulatory Visit (HOSPITAL_COMMUNITY)
Admission: RE | Admit: 2023-07-17 | Discharge: 2023-07-17 | Disposition: A | Discharge status: Home / Self Care | Payer: No Typology Code available for payment source | Attending: Nephrology | Admitting: Nephrology

## 2023-07-17 DIAGNOSIS — N250 Renal osteodystrophy: Secondary | ICD-10-CM | POA: Diagnosis not present

## 2023-07-17 DIAGNOSIS — Y832 Surgical operation with anastomosis, bypass or graft as the cause of abnormal reaction of the patient, or of later complication, without mention of misadventure at the time of the procedure: Secondary | ICD-10-CM | POA: Diagnosis not present

## 2023-07-17 DIAGNOSIS — E1122 Type 2 diabetes mellitus with diabetic chronic kidney disease: Secondary | ICD-10-CM | POA: Diagnosis not present

## 2023-07-17 DIAGNOSIS — D631 Anemia in chronic kidney disease: Secondary | ICD-10-CM | POA: Insufficient documentation

## 2023-07-17 DIAGNOSIS — T82858A Stenosis of vascular prosthetic devices, implants and grafts, initial encounter: Secondary | ICD-10-CM | POA: Insufficient documentation

## 2023-07-17 DIAGNOSIS — Z87891 Personal history of nicotine dependence: Secondary | ICD-10-CM | POA: Insufficient documentation

## 2023-07-17 DIAGNOSIS — Z992 Dependence on renal dialysis: Secondary | ICD-10-CM | POA: Insufficient documentation

## 2023-07-17 DIAGNOSIS — I120 Hypertensive chronic kidney disease with stage 5 chronic kidney disease or end stage renal disease: Secondary | ICD-10-CM | POA: Insufficient documentation

## 2023-07-17 DIAGNOSIS — Z85048 Personal history of other malignant neoplasm of rectum, rectosigmoid junction, and anus: Secondary | ICD-10-CM | POA: Insufficient documentation

## 2023-07-17 DIAGNOSIS — Z79899 Other long term (current) drug therapy: Secondary | ICD-10-CM | POA: Insufficient documentation

## 2023-07-17 DIAGNOSIS — Z794 Long term (current) use of insulin: Secondary | ICD-10-CM | POA: Diagnosis not present

## 2023-07-17 DIAGNOSIS — N186 End stage renal disease: Secondary | ICD-10-CM | POA: Diagnosis not present

## 2023-07-17 DIAGNOSIS — Z8673 Personal history of transient ischemic attack (TIA), and cerebral infarction without residual deficits: Secondary | ICD-10-CM | POA: Diagnosis not present

## 2023-07-17 HISTORY — PX: DIALYSIS/PERMA CATHETER INSERTION: CATH118288

## 2023-07-17 HISTORY — PX: PERIPHERAL VASCULAR BALLOON ANGIOPLASTY: CATH118281

## 2023-07-17 HISTORY — PX: A/V FISTULAGRAM: CATH118298

## 2023-07-17 SURGERY — A/V FISTULAGRAM
Anesthesia: LOCAL

## 2023-07-17 SURGERY — DIALYSIS/PERMA CATHETER INSERTION
Anesthesia: LOCAL

## 2023-07-17 MED ORDER — MIDAZOLAM HCL 2 MG/2ML IJ SOLN
INTRAMUSCULAR | Status: AC
Start: 2023-07-17 — End: ?
  Filled 2023-07-17: qty 2

## 2023-07-17 MED ORDER — SODIUM CHLORIDE 0.9 % IV SOLN: INTRAVENOUS | Status: DC

## 2023-07-17 MED ORDER — FENTANYL CITRATE (PF) 100 MCG/2ML IJ SOLN
INTRAMUSCULAR | Status: DC | PRN
  Administered 2023-07-17: 25 ug via INTRAVENOUS

## 2023-07-17 MED ORDER — HEPARIN (PORCINE) IN NACL 1000-0.9 UT/500ML-% IV SOLN
INTRAVENOUS | Status: DC | PRN
  Administered 2023-07-17: 500 mL

## 2023-07-17 MED ORDER — FENTANYL CITRATE (PF) 100 MCG/2ML IJ SOLN
INTRAMUSCULAR | Status: AC
  Filled 2023-07-17: qty 2

## 2023-07-17 MED ORDER — LIDOCAINE HCL (PF) 1 % IJ SOLN
INTRAMUSCULAR | Status: AC
  Filled 2023-07-17: qty 30

## 2023-07-17 MED ORDER — LIDOCAINE HCL (PF) 1 % IJ SOLN
INTRAMUSCULAR | Status: DC | PRN
  Administered 2023-07-17: 5 mL

## 2023-07-17 MED ORDER — IODIXANOL 320 MG/ML IV SOLN
INTRAVENOUS | Status: DC | PRN
  Administered 2023-07-17: 15 mL via INTRAVENOUS

## 2023-07-17 MED ORDER — MIDAZOLAM HCL 2 MG/2ML IJ SOLN
INTRAMUSCULAR | Status: AC
  Filled 2023-07-17: qty 2

## 2023-07-17 MED ORDER — HEPARIN SODIUM (PORCINE) 1000 UNIT/ML IJ SOLN
INTRAMUSCULAR | Status: AC
  Filled 2023-07-17: qty 10

## 2023-07-17 MED ORDER — MIDAZOLAM HCL 2 MG/2ML IJ SOLN
INTRAMUSCULAR | Status: DC | PRN
  Administered 2023-07-17: .5 mg via INTRAVENOUS

## 2023-07-17 MED ORDER — ACETAMINOPHEN 325 MG PO TABS: 650.0000 mg | ORAL_TABLET | ORAL | Status: DC | PRN

## 2023-07-17 MED ORDER — MIDAZOLAM HCL 2 MG/2ML IJ SOLN
INTRAMUSCULAR | Status: DC | PRN
  Administered 2023-07-17: 1 mg via INTRAVENOUS

## 2023-07-17 MED ORDER — SODIUM CHLORIDE 0.9% FLUSH: 3.0000 mL | INTRAVENOUS | Status: DC | PRN

## 2023-07-17 MED ORDER — IODIXANOL 320 MG/ML IV SOLN
INTRAVENOUS | Status: DC | PRN
  Administered 2023-07-17: 7 mL via INTRAVENOUS

## 2023-07-17 MED ORDER — ONDANSETRON HCL 4 MG/2ML IJ SOLN: 4.0000 mg | Freq: Four times a day (QID) | INTRAMUSCULAR | Status: DC | PRN

## 2023-07-17 MED ORDER — LIDOCAINE HCL (PF) 1 % IJ SOLN
INTRAMUSCULAR | Status: DC | PRN
  Administered 2023-07-17: 6 mL via SUBCUTANEOUS

## 2023-07-17 MED ORDER — HEPARIN SODIUM (PORCINE) 1000 UNIT/ML IJ SOLN
INTRAMUSCULAR | Status: DC | PRN
  Administered 2023-07-17: 3800 [IU] via INTRAVENOUS

## 2023-07-17 SURGICAL SUPPLY — 10 items
BAG SNAP BAND KOVER 36X36 (MISCELLANEOUS) IMPLANT
BALLN MUSTANG 9X40X75 (BALLOONS) ×2
BALLOON MUSTANG 9X40X75 (BALLOONS) IMPLANT
CATH PALINDROME-P 23 W/VT (CATHETERS) IMPLANT
COVER DOME SNAP 22 D (MISCELLANEOUS) IMPLANT
GUIDEWIRE ZIPWIRE 035/150 ANGL (WIRE) IMPLANT
SHEATH PINNACLE 7F 10CM (SHEATH) IMPLANT
SYR MEDALLION 10ML (SYRINGE) IMPLANT
TRAY PV CATH (CUSTOM PROCEDURE TRAY) ×2 IMPLANT
WIRE MICRO SET SILHO 5FR 7 (SHEATH) IMPLANT

## 2023-07-17 SURGICAL SUPPLY — 14 items
BAG SNAP BAND KOVER 36X36 (MISCELLANEOUS) ×2 IMPLANT
BALLN MUSTANG 5.0X40 75 (BALLOONS) ×2
BALLN MUSTANG 7X80X75 (BALLOONS) ×2
BALLOON MUSTANG 5.0X40 75 (BALLOONS) IMPLANT
BALLOON MUSTANG 7X80X75 (BALLOONS) IMPLANT
CATH ANGIO 5F BER2 65CM (CATHETERS) IMPLANT
COVER DOME SNAP 22 D (MISCELLANEOUS) ×2 IMPLANT
GUIDEWIRE ANGLED .035X150CM (WIRE) IMPLANT
SHEATH PINNACLE R/O II 5F 6CM (SHEATH) IMPLANT
SHEATH PINNACLE R/O II 6F 4CM (SHEATH) IMPLANT
SYR MEDALLION 10ML (SYRINGE) IMPLANT
TRAY PV CATH (CUSTOM PROCEDURE TRAY) ×2 IMPLANT
WIRE BENTSON .035X145CM (WIRE) IMPLANT
WIRE MICRO SET SILHO 5FR 7 (SHEATH) IMPLANT

## 2023-07-17 NOTE — Discharge Instructions (Signed)

## 2023-07-17 NOTE — Discharge Instructions (Signed)

## 2023-07-17 NOTE — H&P (Signed)
 Chief Complaint: Decreased flows  Interval H&P   The patient has presented today for tunneled catheter insertion for dialysis access given failed angioplasty with thrombosis despite PTA of the inflow and outflow lesions.  Various methods of treatment have been discussed with the patient.  After consideration of risk, benefits and other options for treatment, the patient has consented to a tunneled catheter insertion.   Risks  of bleeding, pain, infection, nonhealing wound, lung and carotid artery injury were explained to the patient.  The patient's history has been reviewed and the patient has been examined, no changes in status.  Stable for tunneled catheter insertion.  I have reviewed the patient's chart and labs.  Questions were answered to the patient's satisfaction.   Assessment/Plan: ESRD dialyzing  TTS regimen with last dialysis Tuesday Thrombosed lt BBT - planning on catheter insertion. Renal osteodystrophy - continue binders per home regimen. Anemia - managed with ESA's and IV iron  at dialysis center. HTN - resume home regimen.   HPI: Paul Bond is an 83 y.o. male with a history of diabetes, hypertension, rectal cancer, CVA, obstructive uropathy  end-stage renal disease initially being referred for decreased access flows.  Fortunately despite outflow 7 mm angioplasty of long interval basilic vein lesions as well as treatment of the inflow anastomosis stenosis with a 5 mm balloon the patient ended up thrombosing in recovery.  Patient is now here for a tunneled catheter insertion.  ROS Per HPI.  Chemistry and CBC: Creatinine  Date/Time Value Ref Range Status  02/27/2023 09:58 AM 6.82 (H) 0.61 - 1.24 mg/dL Final  97/76/7975 91:82 AM 4.79 (H) 0.61 - 1.24 mg/dL Final  94/96/7976 95:59 PM 5.76 (HH) 0.61 - 1.24 mg/dL Final    Comment:    CRITICAL RESULT CALLED TO, READ BACK BY AND VERIFIED WITH: carrie wilson at [id number]    Creatinine, Ser  Date/Time Value Ref Range Status   05/22/2023 12:52 PM 8.40 (H) 0.61 - 1.24 mg/dL Final  91/80/7975 90:51 AM 9.10 (H) 0.61 - 1.24 mg/dL Final  93/75/7975 90:51 AM 7.70 (H) 0.61 - 1.24 mg/dL Final  87/75/7976 96:90 PM 5.53 (H) 0.61 - 1.24 mg/dL Final  90/92/7976 92:44 AM 6.17 (H) 0.61 - 1.24 mg/dL Final  90/93/7976 97:99 AM 8.19 (H) 0.61 - 1.24 mg/dL Final  90/94/7976 98:98 PM 8.68 (H) 0.61 - 1.24 mg/dL Final  96/90/7976 97:48 AM 5.37 (H) 0.61 - 1.24 mg/dL Final  96/91/7976 95:47 AM 5.37 (H) 0.61 - 1.24 mg/dL Final  96/92/7976 97:73 PM 5.93 (H) 0.61 - 1.24 mg/dL Final  96/93/7976 89:40 PM 5.37 (H) 0.61 - 1.24 mg/dL Final  96/97/7976 96:48 AM 5.73 (H) 0.61 - 1.24 mg/dL Final  96/98/7976 92:95 AM 5.46 (H) 0.61 - 1.24 mg/dL Final  97/71/7976 95:52 AM 5.43 (H) 0.61 - 1.24 mg/dL Final  97/72/7976 95:82 AM 5.41 (H) 0.61 - 1.24 mg/dL Final  97/73/7976 96:55 AM 6.51 (H) 0.61 - 1.24 mg/dL Final  97/74/7976 88:40 AM 7.04 (H) 0.61 - 1.24 mg/dL Final  90/87/7978 96:63 AM 4.78 (H) 0.61 - 1.24 mg/dL Final  90/88/7978 98:57 AM 5.11 (H) 0.61 - 1.24 mg/dL Final  90/89/7978 97:53 AM 5.04 (H) 0.61 - 1.24 mg/dL Final  90/90/7978 95:41 AM 5.09 (H) 0.61 - 1.24 mg/dL Final  90/91/7978 95:64 AM 5.30 (H) 0.61 - 1.24 mg/dL Final  90/92/7978 97:73 AM 5.37 (H) 0.61 - 1.24 mg/dL Final  90/93/7978 97:80 AM 5.86 (H) 0.61 - 1.24 mg/dL Final  90/94/7978 96:57 AM 6.05 (H) 0.61 - 1.24  mg/dL Final  90/95/7978 91:85 PM 6.10 (H) 0.61 - 1.24 mg/dL Final  90/95/7978 94:46 AM 5.90 (H) 0.61 - 1.24 mg/dL Final  90/95/7978 98:69 AM 6.03 (H) 0.61 - 1.24 mg/dL Final  90/96/7978 91:86 PM 6.05 (H) 0.61 - 1.24 mg/dL Final  90/96/7978 91:78 AM 5.26 (H) 0.61 - 1.24 mg/dL Final  90/97/7978 97:45 AM 5.04 (H) 0.61 - 1.24 mg/dL Final  90/97/7978 97:45 AM 5.13 (H) 0.61 - 1.24 mg/dL Final  90/98/7978 96:81 AM 5.56 (H) 0.61 - 1.24 mg/dL Final  91/68/7978 98:55 AM 6.50 (H) 0.61 - 1.24 mg/dL Final  91/69/7978 97:97 AM 7.81 (H) 0.61 - 1.24 mg/dL Final  91/70/7978 97:44 AM  9.40 (H) 0.61 - 1.24 mg/dL Final  91/71/7978 97:95 AM 12.91 (H) 0.61 - 1.24 mg/dL Final  91/72/7978 96:82 AM 15.33 (H) 0.61 - 1.24 mg/dL Final  91/73/7978 94:66 PM 16.99 (H) 0.61 - 1.24 mg/dL Final  91/73/7978 97:75 AM 19.19 (H) 0.61 - 1.24 mg/dL Final  91/74/7978 97:84 AM 24.28 (H) 0.61 - 1.24 mg/dL Final  91/75/7978 88:99 PM 26.09 (H) 0.61 - 1.24 mg/dL Final    Comment:    RESULTS CONFIRMED BY MANUAL DILUTION  02/02/2020 07:20 PM 25.45 (H) 0.61 - 1.24 mg/dL Final    Comment:    RESULTS CONFIRMED BY MANUAL DILUTION  01/20/2020 07:09 PM 18.89 (H) 0.61 - 1.24 mg/dL Final  91/85/7990 90:64 AM 1.36  Final  01/22/2008 05:20 AM 1.40  Final  01/21/2008 09:25 PM 1.6 (H)  Final   No results for input(s): NA, K, CL, CO2, GLUCOSE, BUN, CREATININE, CALCIUM , PHOS in the last 168 hours.  Invalid input(s): ALB No results for input(s): WBC, NEUTROABS, HGB, HCT, MCV, PLT in the last 168 hours. Liver Function Tests: No results for input(s): AST, ALT, ALKPHOS, BILITOT, PROT, ALBUMIN in the last 168 hours. No results for input(s): LIPASE, AMYLASE in the last 168 hours. No results for input(s): AMMONIA in the last 168 hours. Cardiac Enzymes: No results for input(s): CKTOTAL, CKMB, CKMBINDEX, TROPONINI in the last 168 hours. Iron  Studies: No results for input(s): IRON , TIBC, TRANSFERRIN, FERRITIN in the last 72 hours. PT/INR: @LABRCNTIP (inr:5)  Xrays/Other Studies: )No results found for this or any previous visit (from the past 48 hours). No results found.  PMH:   Past Medical History:  Diagnosis Date   Chronic indwelling Foley catheter    Chronic kidney disease    Diabetes mellitus without complication (HCC)    Hypertension    Rectal cancer (HCC)    Stroke (HCC)     PSH:   Past Surgical History:  Procedure Laterality Date   A/V FISTULAGRAM Left 05/31/2023   Procedure: A/V Fistulagram;  Surgeon: Melia Lynwood ORN, MD;   Location: [facility] INVASIVE CV LAB;  Service: Cardiovascular;  Laterality: Left;   AV FISTULA PLACEMENT Left 12/03/2022   Procedure: LEFT ARM ARTERIOVENOUS FISTULA CREATION;  Surgeon: Lanis Fonda BRAVO, MD;  Location: [facility] OR;  Service: Vascular;  Laterality: Left;   BASCILIC VEIN TRANSPOSITION Left 01/28/2023   Procedure: LEFT ARM SECOND STAGE BASILIC VEIN TRANSPOSITION;  Surgeon: Lanis Fonda BRAVO, MD;  Location: [facility] OR;  Service: Vascular;  Laterality: Left;   BIOPSY  02/18/2020   Procedure: BIOPSY;  Surgeon: Leigh Elspeth SQUIBB, MD;  Location: [facility] ENDOSCOPY;  Service: Gastroenterology;;   COLON SURGERY     COLONOSCOPY  02/18/2020   COLONOSCOPY WITH PROPOFOL  N/A 02/18/2020   Procedure: COLONOSCOPY WITH PROPOFOL ;  Surgeon: Leigh Elspeth SQUIBB, MD;  Location: [facility] ENDOSCOPY;  Service: Gastroenterology;  Laterality: N/A;   CYSTOSCOPY W/ URETERAL STENT PLACEMENT Right 02/03/2020   Procedure: CYSTOSCOPY WITH RETROGRADE PYELOGRAM/URETERAL DOUBLE J STENT PLACEMENT;  Surgeon: Matilda Senior, MD;  Location: [facility] OR;  Service: Urology;  Laterality: Right;   DIALYSIS/PERMA CATHETER REMOVAL N/A 05/22/2023   Procedure: DIALYSIS/PERMA CATHETER REMOVAL;  Surgeon: Melia Lynwood ORN, MD;  Location: [facility] INVASIVE CV LAB;  Service: Cardiovascular;  Laterality: N/A;   ESOPHAGOGASTRODUODENOSCOPY (EGD) WITH PROPOFOL  N/A 02/18/2020   Procedure: ESOPHAGOGASTRODUODENOSCOPY (EGD) WITH PROPOFOL ;  Surgeon: Leigh Elspeth SQUIBB, MD;  Location: [facility] ENDOSCOPY;  Service: Gastroenterology;  Laterality: N/A;   IR FLUORO GUIDE CV LINE RIGHT  02/14/2022   IR US  GUIDE VASC ACCESS RIGHT  02/14/2022   PERIPHERAL VASCULAR BALLOON ANGIOPLASTY  05/31/2023   Procedure: PERIPHERAL VASCULAR BALLOON ANGIOPLASTY;  Surgeon: Melia Lynwood ORN, MD;  Location: [facility] INVASIVE CV LAB;  Service: Cardiovascular;;  lt arm fistula   POLYPECTOMY  02/18/2020   Procedure: POLYPECTOMY;  Surgeon: Leigh Elspeth SQUIBB, MD;  Location: [facility] ENDOSCOPY;  Service: Gastroenterology;;    Allergies:   Allergies  Allergen Reactions   Lisinopril Cough   Semaglutide Other (See Comments)    Indigestion  *Ozempic    Medications:   Prior to Admission medications   Medication Sig Start Date End Date Taking? Authorizing Provider  cinacalcet (SENSIPAR) 30 MG tablet Take 30 mg by mouth daily. Tues, Thurs, and Sat    [provider]  Ensure (ENSURE) Take 237 mLs by mouth 2 (two) times daily.    [provider]  glucose 4 GM chewable tablet Chew 4 tablets by mouth daily as needed for low blood sugar.  for low blood sugar, repeat every 15 minutes if blood sugar is less than 70    [provider]  insulin  glargine (LANTUS ) 100 UNIT/ML injection Inject 8 Units into the skin daily.    [provider]  latanoprost  (XALATAN ) 0.005 % ophthalmic solution Place 1 drop into both eyes every evening.    [provider]  sevelamer  carbonate (RENVELA ) 800 MG tablet Take 800 mg by mouth 3 (three) times daily before meals.    [provider]  traMADol  (ULTRAM ) 50 MG tablet Take 1 tablet (50 mg total) by mouth every 6 (six) hours as needed. 01/28/23 01/28/24  Gerome Maurilio HERO, PA-C  UNABLE TO FIND Take 1 fluid ounce by mouth daily. Med Name: GWENDOLYNN GILDARDO LOVE   food supplement    [provider]    Discontinued Meds:  There are no discontinued medications.  Social History:  reports that he quit smoking about 51 years ago. His smoking use included cigarettes. He started smoking about 66 years ago. He has a 15 pack-year smoking history. He has never used smokeless tobacco. He reports that he does not currently use alcohol . He reports that he does not use drugs.  Family History:  No family history on file.  There were no vitals taken for this visit. GEN: NAD, A&Ox3, NCAT HEENT: No conjunctival pallor, EOMI NECK: Supple, no thyromegaly LUNGS: CTA B/L no rales, rhonchi or wheezing CV: RRR, No M/R/G ABD: SNDNT +BS  EXT: No lower extremity  edema ACCESS: Left brachial basilic fistula no bruit        Fe Okubo, LYNWOOD ORN, MD 07/17/2023, 1:38 PM

## 2023-07-17 NOTE — Op Note (Addendum)
 Patient presents for concerns of decreased access flows in his left BBT (transposed by Dr. Lanis on 01/28/2023 with the inflow end to end anastomosis).  Able to open the outflow last procedure here in December but were unable to advance the wire past the inflow stenosis into the brachial artery.  This is his 2nd procedure on this current access.   On the physical exam, the fistula and augment properly.     Summary:  1)      The patient had successful angioplasty (7x8 Mustang FE ~22 atm) of significant long 70% stenosis in the outflow basilic vein swing site.   2)      We were able to cannulate in the arterial limb and after manipulation advance the wire into the proximal brachial artery.  Arteriogram revealed no flow into the fistula with the catheter across the inflow stenosis and on retrograde arteriogram there was a 80% inflow arterial anastomotic stenosis treated with a 5 x 4 Mustang balloon fully effaced approximately 10 atm of pressure.  Distal brachial artery was of decent caliber but the ulnar and radial arteries are small.  Of note we did have to treat the inflow multiple times with the 5 x 4 Mustang as the inflow kept shutting down. 3)      Centrals, axillary vein are patent; the brachial artery is healthy appearing but there appears to be a arterial anastomotic stenosis.   4)      Unfortunately in recovery there was no bruit in the left BBT; attempted to hydro dilate and milk the inflow with no success.  Need a tunneled catheter; I discussed with his daughter as well as the patient and they agree.    Description of procedure: The arm was prepped and draped in the usual sterile fashion. At this point I performed a retrograde cannulation with a 21-gauge micropuncture needle exchanged over guidewire for a 5 French stylette and finally to a 6 French sheath.  I was able to advance and manipulate the angled glide wire with a Bernstein guiding catheter as the inflow anastomotic stenosis.  We actually  performed a retrograde arteriogram demonstrating 80% arterial anastomotic stenosis.  And also performed an antegrade arteriogram revealing a healthy appearing brachial artery proximally and distally but the ulnar and radial arteries were very small.  There was no flow into the graft with the catheter across the inflow anastomotic stenosis. A 5x4 Mustang angioplasty balloon was then inserted over the guidewire and positioned at the basilic vein inflow + arterial anastomosis site stenosis.   Arterial angioplasty (63097) was carried out to 8-10 ATM with FULL effacement of the waist on the balloon at the inflow basilic vein and anastomotic site lesion. The repeat angiogram showed 10% residual stenosis with no evidence of extravasation or dissection but was severe long 70 to 80% outflow basilic vein stenosis extending all the way to the proximal swing segment.  Note we also cannulated in the retrograde direction more in the venous limb as there was a segment in the mid fistula that was not well-visualized and may have had stenosis.  With the catheter close to the juxta segment venogram revealed that the mid fistula was patent.  The left upper arm brachial basilic fistula was then cannulated (63097) with an 18G Angiocath needle directed in an antegrade direction in arterial limb of the fistula. A guidewire was inserted and exchanged for a 6 Fr sheath. Contrast [phone]) injection via the side port of the sheath was performed. The angiogram of  the fistula (63097) showed a long 70-80% stenosis in the outflow basilic; the axillary vein, centrals were patent. A 0.035 wire was then inserted through the sheath and parked in the central veins. A 7x8 Mustang Athletis angioplasty balloon was then inserted over the guidewire and positioned at the basilic vein outflow site stenosis.   Venous angioplasty (63097) was carried out to 20-24 ATM with FULL effacement of the waist on the balloon at all sites in the the basilic outflow  vein. The repeat angiogram showed 10% residual stenosis at the outflow basilic swing site  with no evidence of extravasation or dissection.  Hemostasis: A 3-0 ethilon purse string suture was placed at the cannulation site on removal of the sheath.   Sedation: 0.5 mg Versed , 25 mcg Fentanyl .   Sedation time: 43 min   Contrast. 15 mL   Monitoring: Because of the patient's comorbid conditions and sedation during the procedure, continuous EKG monitoring and O2 saturation monitoring was performed throughout the procedure by the RN. There were no abnormal arrhythmias encountered.   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 Continue to cannulate the fistula with 15G needles.  2. Refer for problems with flows/swelling. 3. Remove the suture next treatment.    Discharge: The patient was discharged home in stable condition. The patient was given education regarding the care of the dialysis access AVF and specific instructions in case of any problems.

## 2023-07-17 NOTE — Op Note (Signed)
 Patient presents for tunneled catheter for dialysis access with unfortunate thrombosis after inflow and outflow PTA this AM; ultrasound shows a good right IJ. The decision was made to place a RIJ tunneled dialysis catheter.  Summary:  1) Successful placement of a new 23 cm cuff to tip hemodialysis catheter (Palindrome) in the right internal jugular vein with the tip in the right atrium.  Central venous angioplasty of innominate vein restriction with a 9 x 4 Mustang balloon to full effacement approximately 10 8 atm of pressure with no evidence of extravasation or dissection. 2) Recommendations to the dialysis unit TO NOT USE HEPARIN  FOR THE FIRST 24 HOURS.  Description of procedure: The right neck, chest and the catheter were prepped and draped in the usual sterile fashion.  Local anesthesia was provided by injecting lidocaine  1% at the neck site overlying the desired venotomy site. Using real time ultrasound guidance, I was able to cannulate the RIJ and advance the guidewire it would only go into the subclavian towards axillary vein on the right side, crossover to the left side.  I finally exchanged out over a Glidewire for a 7 French sheath and confirmed that there was indeed a 70% long innominate vein restriction.  The SVC and right atrium are patent.    I then advanced the wire into the IVC and then advanced a 9 x 4 Mustang balloon over the guidewire through the 8 French sheath into the level of the innominate vein restriction.  Full balloon effacement was achieved to perform central venous angioplasty at approximately 8 to 10 atm of pressure.  After the balloon was removed I injected contrast through the side of the 8 French sheath confirming that there was no evidence of extravasation or dissection and evidence of only less than 30% residual stenosis in the innominate vein.  The SVC and right atrium were now very easily visualized.    I then blunt dissected the tissue around the 5 Fr stylet until it  was freely mobile. Then, after injecting local anesthesia into the desired track and exit site I made a small incision at the exit site and tunneled a 23 cm cuff to tip Palindrome dialysis catheter, pulling it out at the venotomy site. Sequential dilation with 12, 14, and finally the peelaway sheath were done with real time fluoroscopic guidance ensuring that we were straight always lined with the wire.   Then, I inserted the catheter over the wire and through the sheath. After removing the peelaway sheath, I adjusted the catheter until the tip of the catheter was positioned in the right atrium of the heart. The cuff of the catheter was positioned in the subcutaneous tunnel with the cuff approximately 2 cm from the exit site.   Both limbs of the catheter were aspirated and flushed with excellent flow noted. Both limbs of the catheter were locked with heparin  and sterile caps placed. The hub of the catheter was sutured to the chest wall with 2-0 nylon suture.   The neck incision was closed with a subcuticular 3-0 Monocryl suture and a purse-string 3-0 Monocryl was placed at the tunnel exit site. 2-0 loose nylon suture secured the hub to the chest wall.                         Sterile dressings were placed, and the patient returned to recovery in stable condition.  Sedation: 1 mg Versed , 25 mcg Fentanyl . Sedation time: 22 minutes  Contrast 7 mL  Monitoring: Because of the patient's comorbid conditions and sedation during the procedure, continuous EKG monitoring and O2 saturation monitoring was performed throughout the procedure by the RN. There were no abnormal arrhythmias encountered.  Complications: None.  Diagnoses:   N18.6 End stage renal disease Z99.2 Dependence on renal dialysis  Procedures Coding:  36558 Tunneled catheter insertion [id number] Ultrasound guidance 22998  Fluoroscopy guidance for catheter insertion 37248 Venous angioplasty of the innominate vein Q9962  Contrast  Recommendations: Remove the suture in 3 weeks. 2.   Report any blood flow problems to [facility].  Discharge: The patient was discharged home in stable condition. The patient was given education regarding the care of the catheter and specific instructions in case of any problems.

## 2023-07-17 NOTE — H&P (Addendum)
 Chief Complaint: Decreased flows   Interval H&P   The patient has presented today for an angiogram/ angioplasty; patient is followed at [facility] being referred for decreased flows in the left upper arm brachial basilic fistula transposed on 01/28/2023 by Dr. Lanis with and an end to end inflow anastomosis after tunneling.  Unable to Paul the inflow lesion in December 2024.  No Roadrunner wire available   Various methods of treatment have been discussed with the patient.  After consideration of risk, benefits and other options for treatment, the patient has consented to a angiogram/ angioplasty with  possible stent placement.    Risks of angiogram with potential angioplasty and stenting if needed.contrast reaction, extravasation/ bleeding, dissection, hypotension and death were explained to the patient.   The patient's history has been reviewed and the patient has been examined, no changes in status.  Stable for angiogram/angioplasty   I have reviewed the patient's chart and labs.  Questions were answered to the patient's satisfaction; I also called the daughter Francis Daring to discuss what was being done and answered all questions to her satisfaction and she agreed to proceed.     Assessment/Plan: ESRD dialyzing  TTS regimen with last dialysis Tuesday Decreased access flows - planning on angiogram with possibly angioplasty.  Will try cannulating more in the arterial limb to hopefully gain more leverage. Renal osteodystrophy - continue binders per home regimen. Anemia - managed with ESA's and IV iron  at dialysis center. HTN - resume home regimen.   HPI: Paul Bond is an 83 y.o. male with a history of diabetes, hypertension, rectal cancer, CVA, obstructive uropathy  end-stage renal disease being referred for decreased access flows.  ROS Per HPI.  Chemistry and CBC: Creatinine  Date/Time Value Ref Range Status  02/27/2023 09:58 AM 6.82 (H) 0.61 - 1.24 mg/dL Final  97/76/7975  91:82 AM 4.79 (H) 0.61 - 1.24 mg/dL Final  94/96/7976 95:59 PM 5.76 (HH) 0.61 - 1.24 mg/dL Final    Comment:    CRITICAL RESULT CALLED TO, READ BACK BY AND VERIFIED WITH: carrie wilson at [id number].[id number]    Creatinine, Ser  Date/Time Value Ref Range Status  05/22/2023 12:52 PM 8.40 (H) 0.61 - 1.24 mg/dL Final  91/80/7975 90:51 AM 9.10 (H) 0.61 - 1.24 mg/dL Final  93/75/7975 90:51 AM 7.70 (H) 0.61 - 1.24 mg/dL Final  87/75/7976 96:90 PM 5.53 (H) 0.61 - 1.24 mg/dL Final  90/92/7976 92:44 AM 6.17 (H) 0.61 - 1.24 mg/dL Final  90/93/7976 97:99 AM 8.19 (H) 0.61 - 1.24 mg/dL Final  90/94/7976 98:98 PM 8.68 (H) 0.61 - 1.24 mg/dL Final  96/90/7976 97:48 AM 5.37 (H) 0.61 - 1.24 mg/dL Final  96/91/7976 95:47 AM 5.37 (H) 0.61 - 1.24 mg/dL Final  96/92/7976 97:73 PM 5.93 (H) 0.61 - 1.24 mg/dL Final  96/93/7976 89:40 PM 5.37 (H) 0.61 - 1.24 mg/dL Final  96/97/7976 96:48 AM 5.73 (H) 0.61 - 1.24 mg/dL Final  96/98/7976 92:95 AM 5.46 (H) 0.61 - 1.24 mg/dL Final  97/71/7976 95:52 AM 5.43 (H) 0.61 - 1.24 mg/dL Final  97/72/7976 95:82 AM 5.41 (H) 0.61 - 1.24 mg/dL Final  97/73/7976 96:55 AM 6.51 (H) 0.61 - 1.24 mg/dL Final  97/74/7976 88:40 AM 7.04 (H) 0.61 - 1.24 mg/dL Final  90/87/7978 96:63 AM 4.78 (H) 0.61 - 1.24 mg/dL Final  90/88/7978 98:57 AM 5.11 (H) 0.61 - 1.24 mg/dL Final  90/89/7978 97:53 AM 5.04 (H) 0.61 - 1.24 mg/dL Final  90/90/7978 95:41 AM 5.09 (H) 0.61 - 1.24 mg/dL  Final  02/17/2020 04:35 AM 5.30 (H) 0.61 - 1.24 mg/dL Final  90/92/7978 97:73 AM 5.37 (H) 0.61 - 1.24 mg/dL Final  90/93/7978 97:80 AM 5.86 (H) 0.61 - 1.24 mg/dL Final  90/94/7978 96:57 AM 6.05 (H) 0.61 - 1.24 mg/dL Final  90/95/7978 91:85 PM 6.10 (H) 0.61 - 1.24 mg/dL Final  90/95/7978 94:46 AM 5.90 (H) 0.61 - 1.24 mg/dL Final  90/95/7978 98:69 AM 6.03 (H) 0.61 - 1.24 mg/dL Final  90/96/7978 91:86 PM 6.05 (H) 0.61 - 1.24 mg/dL Final  90/96/7978 91:78 AM 5.26 (H) 0.61 - 1.24 mg/dL Final  90/97/7978 97:45 AM 5.04 (H) 0.61 - 1.24  mg/dL Final  90/97/7978 97:45 AM 5.13 (H) 0.61 - 1.24 mg/dL Final  90/98/7978 96:81 AM 5.56 (H) 0.61 - 1.24 mg/dL Final  91/68/7978 98:55 AM 6.50 (H) 0.61 - 1.24 mg/dL Final  91/69/7978 97:97 AM 7.81 (H) 0.61 - 1.24 mg/dL Final  91/70/7978 97:44 AM 9.40 (H) 0.61 - 1.24 mg/dL Final  91/71/7978 97:95 AM 12.91 (H) 0.61 - 1.24 mg/dL Final  91/72/7978 96:82 AM 15.33 (H) 0.61 - 1.24 mg/dL Final  91/73/7978 94:66 PM 16.99 (H) 0.61 - 1.24 mg/dL Final  91/73/7978 97:75 AM 19.19 (H) 0.61 - 1.24 mg/dL Final  91/74/7978 97:84 AM 24.28 (H) 0.61 - 1.24 mg/dL Final  91/75/7978 88:99 PM 26.09 (H) 0.61 - 1.24 mg/dL Final    Comment:    RESULTS CONFIRMED BY MANUAL DILUTION  02/02/2020 07:20 PM 25.45 (H) 0.61 - 1.24 mg/dL Final    Comment:    RESULTS CONFIRMED BY MANUAL DILUTION  01/20/2020 07:09 PM 18.89 (H) 0.61 - 1.24 mg/dL Final  91/85/7990 90:64 AM 1.36  Final  01/22/2008 05:20 AM 1.40  Final  01/21/2008 09:25 PM 1.6 (H)  Final   No results for input(s): NA, K, CL, CO2, GLUCOSE, BUN, CREATININE, CALCIUM , PHOS in the last 168 hours.  Invalid input(s): ALB No results for input(s): WBC, NEUTROABS, HGB, HCT, MCV, PLT in the last 168 hours. Liver Function Tests: No results for input(s): AST, ALT, ALKPHOS, BILITOT, PROT, ALBUMIN in the last 168 hours. No results for input(s): LIPASE, AMYLASE in the last 168 hours. No results for input(s): AMMONIA in the last 168 hours. Cardiac Enzymes: No results for input(s): CKTOTAL, CKMB, CKMBINDEX, TROPONINI in the last 168 hours. Iron  Studies: No results for input(s): IRON , TIBC, TRANSFERRIN, FERRITIN in the last 72 hours. PT/INR: @LABRCNTIP (inr:5)  Xrays/Other Studies: )No results found for this or any previous visit (from the past 48 hours). No results found.  PMH:   Past Medical History:  Diagnosis Date   Chronic indwelling Foley catheter    Chronic kidney disease    Diabetes mellitus  without complication (HCC)    Hypertension    Rectal cancer (HCC)    Stroke (HCC)     PSH:   Past Surgical History:  Procedure Laterality Date   A/V FISTULAGRAM Left 05/31/2023   Procedure: A/V Fistulagram;  Surgeon: Melia Lynwood ORN, MD;  Location: [facility] INVASIVE CV LAB;  Service: Cardiovascular;  Laterality: Left;   AV FISTULA PLACEMENT Left 12/03/2022   Procedure: LEFT ARM ARTERIOVENOUS FISTULA CREATION;  Surgeon: Lanis Fonda BRAVO, MD;  Location: [facility] OR;  Service: Vascular;  Laterality: Left;   BASCILIC VEIN TRANSPOSITION Left 01/28/2023   Procedure: LEFT ARM SECOND STAGE BASILIC VEIN TRANSPOSITION;  Surgeon: Lanis Fonda BRAVO, MD;  Location: [facility] OR;  Service: Vascular;  Laterality: Left;   BIOPSY  02/18/2020   Procedure: BIOPSY;  Surgeon: Leigh Elspeth SQUIBB, MD;  Location: [facility] ENDOSCOPY;  Service: Gastroenterology;;   COLON SURGERY     COLONOSCOPY  02/18/2020   COLONOSCOPY WITH PROPOFOL  N/A 02/18/2020   Procedure: COLONOSCOPY WITH PROPOFOL ;  Surgeon: Leigh Elspeth SQUIBB, MD;  Location: [facility] ENDOSCOPY;  Service: Gastroenterology;  Laterality: N/A;   CYSTOSCOPY W/ URETERAL STENT PLACEMENT Right 02/03/2020   Procedure: CYSTOSCOPY WITH RETROGRADE PYELOGRAM/URETERAL DOUBLE J STENT PLACEMENT;  Surgeon: Matilda Senior, MD;  Location: [facility] OR;  Service: Urology;  Laterality: Right;   DIALYSIS/PERMA CATHETER REMOVAL N/A 05/22/2023   Procedure: DIALYSIS/PERMA CATHETER REMOVAL;  Surgeon: Melia Lynwood ORN, MD;  Location[facility] INVASIVE CV LAB;  Service: Cardiovascular;  Laterality: N/A;   ESOPHAGOGASTRODUODENOSCOPY (EGD) WITH PROPOFOL  N/A 02/18/2020   Procedure: ESOPHAGOGASTRODUODENOSCOPY (EGD) WITH PROPOFOL ;  Surgeon: Leigh Elspeth SQUIBB, MD;  Location: [facility] ENDOSCOPY;  Service: Gastroenterology;  Laterality: N/A;   IR FLUORO GUIDE CV LINE RIGHT  02/14/2022   IR US  GUIDE VASC ACCESS RIGHT  02/14/2022   PERIPHERAL VASCULAR BALLOON ANGIOPLASTY  05/31/2023   Procedure: PERIPHERAL VASCULAR BALLOON ANGIOPLASTY;  Surgeon: Melia Lynwood ORN, MD;  Location: [facility] INVASIVE CV LAB;  Service: Cardiovascular;;  lt arm fistula   POLYPECTOMY  02/18/2020   Procedure: POLYPECTOMY;  Surgeon: Leigh Elspeth SQUIBB, MD;  Location: [facility] ENDOSCOPY;  Service: Gastroenterology;;    Allergies:  Allergies  Allergen Reactions   Lisinopril Cough   Semaglutide Other (See Comments)    Indigestion  *Ozempic    Medications:   Prior to Admission medications   Medication Sig Start Date End Date Taking? Authorizing Provider  cinacalcet (SENSIPAR) 30 MG tablet Take 30 mg by mouth daily. Tues, Thurs, and Sat    [provider]  Ensure (ENSURE) Take 237 mLs by mouth 2 (two) times daily.    [provider]  glucose 4 GM chewable tablet Chew 4 tablets by mouth daily as needed for low blood sugar.  for low blood sugar, repeat every 15 minutes if blood sugar is less than 70    [provider]  insulin  glargine (LANTUS ) 100 UNIT/ML injection Inject 8 Units into the skin daily.    [provider]  latanoprost  (XALATAN ) 0.005 % ophthalmic solution Place 1 drop into both eyes every evening.    [provider]  sevelamer  carbonate (RENVELA ) 800 MG tablet Take 800 mg by mouth 3 (three) times daily before meals.    [provider]  traMADol  (ULTRAM ) 50 MG tablet Take 1 tablet (50 mg total) by mouth every 6 (six) hours as needed. 01/28/23 01/28/24  Gerome Maurilio HERO, PA-C  UNABLE TO FIND Take 1 fluid ounce by mouth daily. Med Name: GWENDOLYNN GILDARDO LOVE   food supplement    [provider]    Discontinued Meds:  There are no discontinued medications.  Social History:  reports that he quit smoking about 51 years ago. His smoking use included cigarettes. He started smoking about 66 years ago. He has a 15 pack-year smoking history. He has never used smokeless tobacco. He reports that he does not currently use alcohol . He reports that he does not use drugs.  Family History:  No family history on  file.  There were no vitals taken for this visit. GEN: NAD, A&Ox3, NCAT HEENT: No conjunctival pallor, EOMI NECK: Supple, no thyromegaly LUNGS: CTA B/L no rales, rhonchi or wheezing CV: RRR, No M/R/G ABD: SNDNT +BS  EXT: No lower extremity edema ACCESS: Left brachial basilic fistula and does not augment strongly        Lindsee Labarre,  LYNWOOD ORN, MD 07/17/2023, 9:44 AM

## 2023-07-18 ENCOUNTER — Encounter (HOSPITAL_COMMUNITY): Payer: Self-pay | Admitting: Nephrology

## 2023-07-22 ENCOUNTER — Other Ambulatory Visit: Payer: Self-pay | Admitting: *Deleted

## 2023-07-22 DIAGNOSIS — N186 End stage renal disease: Secondary | ICD-10-CM

## 2023-07-23 NOTE — Progress Notes (Unsigned)
Office Note     CC:  ESRD Requesting Provider:  [facility]  HPI: Paul Bond is a Right handed 83 y.o. (03/20/1941) male with kidney disease who presents at the request of [facility] for permanent HD access. The patient has had one prior access procedure -left upper extremity brachiobasilic fistula creation 01/2023.Marland Kitchen Per pt, previous tunneled lines have been placed in right IJ. Current access is right IJ.  On exam, Paul Bond was doing well, accompanied by his daughter.  His left upper extremity fistula continues to give him trouble with what appears to be anastomotic narrowing, and outflow stenosis.  He had no complaints on today's exam.    Past Medical History:  Diagnosis Date   Chronic indwelling Foley catheter    Chronic kidney disease    Diabetes mellitus without complication (HCC)    Hypertension    Rectal cancer (HCC)    Stroke [facility])     Past Surgical History:  Procedure Laterality Date   A/V FISTULAGRAM Left 05/31/2023   Procedure: A/V Fistulagram;  Surgeon: Ethelene Hal, MD;  Location: [facility] INVASIVE CV LAB;  Service: Cardiovascular;  Laterality: Left;   A/V FISTULAGRAM N/A 07/17/2023   Procedure: A/V Fistulagram;  Surgeon: Ethelene Hal, MD;  Location: [facility] INVASIVE CV LAB;  Service: Cardiovascular;  Laterality: N/A;   AV FISTULA PLACEMENT Left 12/03/2022   Procedure: LEFT ARM ARTERIOVENOUS FISTULA CREATION;  Surgeon: Victorino Sparrow, MD;  Location: [facility] OR;  Service: Vascular;  Laterality: Left;   BASCILIC VEIN TRANSPOSITION Left 01/28/2023   Procedure: LEFT ARM SECOND STAGE BASILIC VEIN TRANSPOSITION;  Surgeon: Victorino Sparrow, MD;  Location[facility] OR;  Service: Vascular;  Laterality: Left;   BIOPSY  02/18/2020   Procedure: BIOPSY;  Surgeon: Benancio Deeds, MD;  Location: [facility] ENDOSCOPY;  Service: Gastroenterology;;   COLON SURGERY     COLONOSCOPY  02/18/2020   COLONOSCOPY WITH PROPOFOL N/A 02/18/2020   Procedure: COLONOSCOPY WITH PROPOFOL;  Surgeon:  Benancio Deeds, MD;  Location: [facility] ENDOSCOPY;  Service: Gastroenterology;  Laterality: N/A;   CYSTOSCOPY W/ URETERAL STENT PLACEMENT Right 02/03/2020   Procedure: CYSTOSCOPY WITH RETROGRADE PYELOGRAM/URETERAL DOUBLE J STENT PLACEMENT;  Surgeon: Marcine Matar, MD;  Location: [facility] OR;  Service: Urology;  Laterality: Right;   DIALYSIS/PERMA CATHETER INSERTION N/A 07/17/2023   Procedure: DIALYSIS/PERMA CATHETER INSERTION;  Surgeon: Ethelene Hal, MD;  Location: [facility] INVASIVE CV LAB;  Service: Cardiovascular;  Laterality: N/A;   DIALYSIS/PERMA CATHETER REMOVAL N/A 05/22/2023   Procedure: DIALYSIS/PERMA CATHETER REMOVAL;  Surgeon: Ethelene Hal, MD;  Location: [facility] INVASIVE CV LAB;  Service: Cardiovascular;  Laterality: N/A;   ESOPHAGOGASTRODUODENOSCOPY (EGD) WITH PROPOFOL N/A 02/18/2020   Procedure: ESOPHAGOGASTRODUODENOSCOPY (EGD) WITH PROPOFOL;  Surgeon: Benancio Deeds, MD;  Location: [facility] ENDOSCOPY;  Service: Gastroenterology;  Laterality: N/A;   IR FLUORO GUIDE CV LINE RIGHT  02/14/2022   IR US GUIDE VASC ACCESS RIGHT  02/14/2022   PERIPHERAL VASCULAR BALLOON ANGIOPLASTY  05/31/2023   Procedure: PERIPHERAL VASCULAR BALLOON ANGIOPLASTY;  Surgeon: Ethelene Hal, MD;  Location: [facility] INVASIVE CV LAB;  Service: Cardiovascular;;  lt arm fistula   PERIPHERAL VASCULAR BALLOON ANGIOPLASTY  07/17/2023   Procedure: PERIPHERAL VASCULAR BALLOON ANGIOPLASTY;  Surgeon: Ethelene Hal, MD;  Location: [facility] INVASIVE CV LAB;  Service: Cardiovascular;;  Arterial Anastomosis; Outflow Basilic Vein   PERIPHERAL VASCULAR BALLOON ANGIOPLASTY  07/17/2023   Procedure: PERIPHERAL VASCULAR BALLOON ANGIOPLASTY;  Surgeon: Ethelene Hal, MD;  Location: [facility] INVASIVE CV LAB;  Service: Cardiovascular;;  right innominate   POLYPECTOMY  02/18/2020   Procedure: POLYPECTOMY;  Surgeon: Benancio Deeds, MD;  Location: [facility] ENDOSCOPY;  Service: Gastroenterology;;    Social History   Socioeconomic History   Marital status: Married    Spouse name:  Not on file   Number of children: Not on file   Years of education: Not on file   Highest education level: Not on file  Occupational History   Occupation: retired  Tobacco Use   Smoking status: Former    Current packs/day: 0.00    Average packs/day: 1 pack/day for 15.0 years (15.0 ttl pk-yrs)    Types: Cigarettes    Start date: 63    Quit date: 1974    Years since quitting: 51.1   Smokeless tobacco: Never  Vaping Use   Vaping status: Never Used  Substance and Sexual Activity   Alcohol use: Not Currently    Comment: quit in 1974, binge drinking prior   Drug use: Never   Sexual activity: Not on file  Other Topics Concern   Not on file  Social History Narrative   Not on file   Social Drivers of Health   Financial Resource Strain: Not on file  Food Insecurity: No Food Insecurity (02/14/2022)   Hunger Vital Sign    Worried About Running Out of Food in the Last Year: Never true    Ran Out of Food in the Last Year: Never true  Transportation Needs: No Transportation Needs (02/14/2022)   PRAPARE - Administrator, Civil Service (Medical): No    Lack of Transportation (Non-Medical): No  Physical Activity: Not on file  Stress: Not on file  Social Connections: Not on file  Intimate Partner Violence: Not At Risk (02/14/2022)   Humiliation, Afraid, Rape, and Kick questionnaire    Fear of Current or Ex-Partner: No    Emotionally Abused: No    Physically Abused: No    Sexually Abused: No   No family history on file.  Current Outpatient Medications  Medication Sig Dispense Refill   cinacalcet (SENSIPAR) 30 MG tablet Take 30 mg by mouth daily. Tues, Thurs, and Sat     Ensure (ENSURE) Take 237 mLs by mouth 2 (two) times daily.     glucose 4 GM chewable tablet Chew 4 tablets by mouth daily as needed for low blood sugar.  for low blood sugar, repeat every 15 minutes if blood sugar is less than 70     insulin glargine (LANTUS) 100 UNIT/ML injection Inject 8 Units into the skin  daily.     latanoprost (XALATAN) 0.005 % ophthalmic solution Place 1 drop into both eyes every evening.     sevelamer carbonate (RENVELA) 800 MG tablet Take 800 mg by mouth 3 (three) times daily before meals.     traMADol (ULTRAM) 50 MG tablet Take 1 tablet (50 mg total) by mouth every 6 (six) hours as needed. 20 tablet 0   UNABLE TO FIND Take 1 fluid ounce by mouth daily. Med Name: Maree Erie   food supplement     No current facility-administered medications for this visit.    Allergies  Allergen Reactions   Lisinopril Cough   Semaglutide Other (See Comments)    Indigestion  *Ozempic     REVIEW OF SYSTEMS:   [X]  denotes positive finding, [ ]  denotes negative finding Cardiac  Comments:  Chest pain or chest pressure:    Shortness of breath upon exertion:    Short  of breath when lying flat:    Irregular heart rhythm:        Vascular    Pain in calf, thigh, or hip brought on by ambulation:    Pain in feet at night that wakes you up from your sleep:     Blood clot in your veins:    Leg swelling:         Pulmonary    Oxygen at home:    Productive cough:     Wheezing:         Neurologic    Sudden weakness in arms or legs:     Sudden numbness in arms or legs:     Sudden onset of difficulty speaking or slurred speech:    Temporary loss of vision in one eye:     Problems with dizziness:         Gastrointestinal    Blood in stool:     Vomited blood:         Genitourinary    Burning when urinating:     Blood in urine:        Psychiatric    Major depression:         Hematologic    Bleeding problems:    Problems with blood clotting too easily:        Skin    Rashes or ulcers:        Constitutional    Fever or chills:      PHYSICAL EXAMINATION:  There were no vitals filed for this visit.  General:  WDWN in NAD; vital signs documented above Gait: Not observed HENT: WNL, normocephalic Pulmonary: normal non-labored breathing , without Rales,  rhonchi,  wheezing Cardiac: regular HR Abdomen: soft, NT, no masses Skin: without rashes Vascular Exam/Pulses:  Right Left  Radial 2+ (normal) 2+ (normal)  Ulnar 2+ (normal) 2+ (normal)                   Extremities: without ischemic changes, without Gangrene , without cellulitis; without open wounds;  Musculoskeletal: no muscle wasting or atrophy  Neurologic: A&O X 3;  No focal weakness or paresthesias are detected Psychiatric:  The pt has Normal affect.   Non-Invasive Vascular Imaging:   No usable superficial veins bilaterally    ASSESSMENT/PLAN:  GABRIELA GIANNELLI is a 83 y.o. male who presents with  fistula malfunction.  Interestingly, the fistula worked for quite some time, but is now having difficulties with inflow stenosis, possible outflow stenosis  Based on vein mapping and examination, options include left arm AV graft versus fistula revision.  I discussed left arm fistula revision-arterial anastomotic revision versus AV graft.  Coty was amenable.   I had an extensive discussion with this patient in regards to the nature of access surgery, including risk, benefits, and alternatives.   The patient is aware that the risks of access surgery include but are not limited to: bleeding, infection, steal syndrome, nerve damage, ischemic monomelic neuropathy, failure of access to mature, complications related to venous hypertension, and possible need for additional access procedures in the future. After discussing risks and benefits of the above, Khoa elected to proceed.  Victorino Sparrow, MD [facility] [phone]

## 2023-07-24 ENCOUNTER — Telehealth: Payer: Self-pay

## 2023-07-24 NOTE — Telephone Encounter (Signed)
Triage Call:  -received call from Shawn Stall, RN @ [facility] who states his access was thrombosed and today thrill is present.  Wanted to see if duplex could be done at appt tomorrow.  -Returned call to inform duplex is already on the schedule and will keep appt as planned

## 2023-07-25 ENCOUNTER — Other Ambulatory Visit: Payer: Self-pay

## 2023-07-25 ENCOUNTER — Ambulatory Visit (HOSPITAL_COMMUNITY)
Admission: RE | Admit: 2023-07-25 | Discharge: 2023-07-25 | Discharge status: Home / Self Care | Payer: No Typology Code available for payment source | Source: Ambulatory Visit | Attending: Vascular Surgery

## 2023-07-25 ENCOUNTER — Ambulatory Visit (HOSPITAL_COMMUNITY)
Admission: RE | Admit: 2023-07-25 | Discharge: 2023-07-25 | Disposition: A | Discharge status: Home / Self Care | Payer: No Typology Code available for payment source | Source: Ambulatory Visit | Attending: Vascular Surgery | Admitting: Vascular Surgery

## 2023-07-25 ENCOUNTER — Encounter: Payer: Self-pay | Admitting: Vascular Surgery

## 2023-07-25 ENCOUNTER — Ambulatory Visit (INDEPENDENT_AMBULATORY_CARE_PROVIDER_SITE_OTHER): Payer: No Typology Code available for payment source | Admitting: Vascular Surgery

## 2023-07-25 VITALS — BP 111/59 | HR 73 | Temp 97.3°F | Ht 67.0 in | Wt 146.0 lb

## 2023-07-25 DIAGNOSIS — Z992 Dependence on renal dialysis: Secondary | ICD-10-CM | POA: Diagnosis not present

## 2023-07-25 DIAGNOSIS — N186 End stage renal disease: Secondary | ICD-10-CM

## 2023-07-25 DIAGNOSIS — Z01818 Encounter for other preprocedural examination: Secondary | ICD-10-CM | POA: Diagnosis not present

## 2023-07-25 DIAGNOSIS — T82590A Other mechanical complication of surgically created arteriovenous fistula, initial encounter: Secondary | ICD-10-CM

## 2023-07-26 ENCOUNTER — Other Ambulatory Visit: Payer: Self-pay

## 2023-07-26 ENCOUNTER — Encounter (HOSPITAL_COMMUNITY): Payer: Self-pay | Admitting: Vascular Surgery

## 2023-07-26 NOTE — Progress Notes (Signed)
PCP - [facility] Cardiologist - [facility]  Chest x-ray - 06/03/22 EKG - DOS Stress Test - n/a ECHO LTD - 02/03/20 Cardiac Cath - n/a  ICD Pacemaker/Loop - n/a  Sleep Study -  n/a  Diabetes Type 2 Fasting 150-170s  THE MORNING OF SURGERY, take 4 units Lantus Insulin.  If your blood sugar is less than 70 mg/dL, you will need to treat for low blood sugar: Treat a low blood sugar (less than 70 mg/dL) with  cup of clear juice (cranberry or apple), 4 glucose tablets, OR glucose gel. Recheck blood sugar in 15 minutes after treatment (to make sure it is greater than 70 mg/dL). If your blood sugar is not greater than 70 mg/dL on recheck, call [phone] for further instructions.  Aspirin and Blood Thinner Instructions:  n/a  NPO  Anesthesia review: Yes  STOP now taking any Aspirin (unless otherwise instructed by your surgeon), Aleve, Naproxen, Ibuprofen, Motrin, Advil, Goody's, BC's, all herbal medications, fish oil, and all vitamins.   Coronavirus Screening Does the patient have any of the following symptoms:  Cough yes/no: No Fever (>100.21F)  yes/no: No Runny nose yes/no: No Sore throat yes/no: No Difficulty breathing/shortness of breath  yes/no: No  Has the patient traveled in the last 14 days and where? yes/no: No  Patient's daughter Brynda Peon verbalized understanding of instructions that were given via phone.

## 2023-07-26 NOTE — Anesthesia Preprocedure Evaluation (Addendum)
 Anesthesia Evaluation  Patient identified by MRN, date of birth, ID band Patient awake    Reviewed: Allergy & Precautions, NPO status , Patient's Chart, lab work & pertinent test results  Airway Mallampati: II  TM Distance: >3 FB Neck ROM: Full    Dental no notable dental hx. (+) Edentulous Upper,    Pulmonary former smoker   Pulmonary exam normal breath sounds clear to auscultation       Cardiovascular hypertension, Pt. on medications (-) angina (-) Past MI Normal cardiovascular exam Rhythm:Regular Rate:Normal     Neuro/Psych CVA, No Residual Symptoms    GI/Hepatic   Endo/Other  diabetes, Insulin Dependent    Renal/GU ESRF and DialysisRenal diseaseTThSat     Musculoskeletal   Abdominal   Peds  Hematology   Anesthesia Other Findings   Reproductive/Obstetrics                             Anesthesia Physical Anesthesia Plan  ASA: 3  Anesthesia Plan: Regional   Post-op Pain Management: Regional block* and Minimal or no pain anticipated   Induction: Intravenous  PONV Risk Score and Plan: Treatment may vary due to age or medical condition  Airway Management Planned:   Additional Equipment: None  Intra-op Plan:   Post-operative Plan:   Informed Consent: I have reviewed the patients History and Physical, chart, labs and discussed the procedure including the risks, benefits and alternatives for the proposed anesthesia with the patient or authorized representative who has indicated his/her understanding and acceptance.     Dental advisory given  Plan Discussed with: CRNA and Surgeon  Anesthesia Plan Comments: (PAT note written 07/26/2023 by Shonna Chock, PA-C.  L Supraclavicular block plus mac  )       Anesthesia Quick Evaluation
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
## 2023-07-29 ENCOUNTER — Ambulatory Visit (HOSPITAL_COMMUNITY): Payer: No Typology Code available for payment source | Admitting: Vascular Surgery

## 2023-07-29 ENCOUNTER — Ambulatory Visit (HOSPITAL_COMMUNITY)
Admission: RE | Admit: 2023-07-29 | Discharge: 2023-07-29 | Disposition: A | Discharge status: Home / Self Care | Payer: No Typology Code available for payment source | Attending: Vascular Surgery | Admitting: Vascular Surgery

## 2023-07-29 ENCOUNTER — Other Ambulatory Visit: Payer: Self-pay

## 2023-07-29 ENCOUNTER — Encounter (HOSPITAL_COMMUNITY)
Admission: RE | Disposition: A | Discharge status: Home / Self Care | Payer: Self-pay | Source: Home / Self Care | Attending: Vascular Surgery

## 2023-07-29 ENCOUNTER — Ambulatory Visit (HOSPITAL_COMMUNITY): Payer: No Typology Code available for payment source

## 2023-07-29 DIAGNOSIS — I120 Hypertensive chronic kidney disease with stage 5 chronic kidney disease or end stage renal disease: Secondary | ICD-10-CM

## 2023-07-29 DIAGNOSIS — Z992 Dependence on renal dialysis: Secondary | ICD-10-CM | POA: Insufficient documentation

## 2023-07-29 DIAGNOSIS — Z87891 Personal history of nicotine dependence: Secondary | ICD-10-CM | POA: Insufficient documentation

## 2023-07-29 DIAGNOSIS — Z794 Long term (current) use of insulin: Secondary | ICD-10-CM | POA: Diagnosis not present

## 2023-07-29 DIAGNOSIS — Y712 Prosthetic and other implants, materials and accessory cardiovascular devices associated with adverse incidents: Secondary | ICD-10-CM | POA: Insufficient documentation

## 2023-07-29 DIAGNOSIS — E1122 Type 2 diabetes mellitus with diabetic chronic kidney disease: Secondary | ICD-10-CM

## 2023-07-29 DIAGNOSIS — I1311 Hypertensive heart and chronic kidney disease without heart failure, with stage 5 chronic kidney disease, or end stage renal disease: Secondary | ICD-10-CM | POA: Insufficient documentation

## 2023-07-29 DIAGNOSIS — N186 End stage renal disease: Secondary | ICD-10-CM

## 2023-07-29 DIAGNOSIS — Z8673 Personal history of transient ischemic attack (TIA), and cerebral infarction without residual deficits: Secondary | ICD-10-CM | POA: Diagnosis not present

## 2023-07-29 DIAGNOSIS — T8241XA Breakdown (mechanical) of vascular dialysis catheter, initial encounter: Secondary | ICD-10-CM | POA: Diagnosis present

## 2023-07-29 HISTORY — PX: FISTULOGRAM: SHX5832

## 2023-07-29 LAB — POCT I-STAT, CHEM 8
BUN: 45 mg/dL — ABNORMAL HIGH (ref 8–23)
Calcium, Ion: 0.92 mmol/L — ABNORMAL LOW (ref 1.15–1.40)
Chloride: 98 mmol/L (ref 98–111)
Creatinine, Ser: 8.7 mg/dL — ABNORMAL HIGH (ref 0.61–1.24)
Glucose, Bld: 182 mg/dL — ABNORMAL HIGH (ref 70–99)
HCT: 28 % — ABNORMAL LOW (ref 39.0–52.0)
Hemoglobin: 9.5 g/dL — ABNORMAL LOW (ref 13.0–17.0)
Potassium: 4.7 mmol/L (ref 3.5–5.1)
Sodium: 137 mmol/L (ref 135–145)
TCO2: 30 mmol/L (ref 22–32)

## 2023-07-29 LAB — GLUCOSE, CAPILLARY
Glucose-Capillary: 168 mg/dL — ABNORMAL HIGH (ref 70–99)
Glucose-Capillary: 185 mg/dL — ABNORMAL HIGH (ref 70–99)

## 2023-07-29 SURGERY — FISTULOGRAM
Anesthesia: Regional

## 2023-07-29 MED ORDER — HEPARIN 6000 UNIT IRRIGATION SOLUTION
Status: DC | PRN
  Administered 2023-07-29: 1

## 2023-07-29 MED ORDER — CHLORHEXIDINE GLUCONATE 0.12 % MT SOLN
15.0000 mL | Freq: Once | OROMUCOSAL | Status: AC
  Administered 2023-07-29: 15 mL via OROMUCOSAL
  Filled 2023-07-29: qty 15

## 2023-07-29 MED ORDER — ORAL CARE MOUTH RINSE: 15.0000 mL | Freq: Once | OROMUCOSAL | Status: AC

## 2023-07-29 MED ORDER — SODIUM CHLORIDE 0.9% FLUSH: 3.0000 mL | INTRAVENOUS | Status: DC | PRN

## 2023-07-29 MED ORDER — PHENYLEPHRINE 80 MCG/ML (10ML) SYRINGE FOR IV PUSH (FOR BLOOD PRESSURE SUPPORT)
PREFILLED_SYRINGE | INTRAVENOUS | Status: DC | PRN
  Administered 2023-07-29 (×2): 160 ug via INTRAVENOUS
  Administered 2023-07-29: 80 ug via INTRAVENOUS

## 2023-07-29 MED ORDER — CHLORHEXIDINE GLUCONATE 4 % EX SOLN: 60.0000 mL | Freq: Once | CUTANEOUS | Status: DC

## 2023-07-29 MED ORDER — PROPOFOL 500 MG/50ML IV EMUL
INTRAVENOUS | Status: DC | PRN
  Administered 2023-07-29: 85 ug/kg/min via INTRAVENOUS

## 2023-07-29 MED ORDER — IODIXANOL 320 MG/ML IV SOLN
INTRAVENOUS | Status: DC | PRN
  Administered 2023-07-29: 35 mL

## 2023-07-29 MED ORDER — BUPIVACAINE HCL (PF) 0.5 % IJ SOLN
INTRAMUSCULAR | Status: DC | PRN
  Administered 2023-07-29: 30 mL via PERINEURAL

## 2023-07-29 MED ORDER — EPHEDRINE 5 MG/ML INJ
INTRAVENOUS | Status: AC
  Filled 2023-07-29: qty 5

## 2023-07-29 MED ORDER — CEFAZOLIN SODIUM-DEXTROSE 2-4 GM/100ML-% IV SOLN
2.0000 g | INTRAVENOUS | Status: AC
  Administered 2023-07-29: 2 g via INTRAVENOUS
  Filled 2023-07-29: qty 100

## 2023-07-29 MED ORDER — PHENYLEPHRINE HCL-NACL 20-0.9 MG/250ML-% IV SOLN
INTRAVENOUS | Status: DC | PRN
  Administered 2023-07-29: 40 ug/min via INTRAVENOUS

## 2023-07-29 MED ORDER — INSULIN ASPART 100 UNIT/ML IJ SOLN: 0.0000 [IU] | INTRAMUSCULAR | Status: DC | PRN

## 2023-07-29 MED ORDER — EPHEDRINE SULFATE-NACL 50-0.9 MG/10ML-% IV SOSY
PREFILLED_SYRINGE | INTRAVENOUS | Status: DC | PRN
  Administered 2023-07-29: 10 mg via INTRAVENOUS
  Administered 2023-07-29: 5 mg via INTRAVENOUS
  Administered 2023-07-29: 10 mg via INTRAVENOUS

## 2023-07-29 MED ORDER — STERILE WATER FOR IRRIGATION IR SOLN
Status: DC | PRN
Start: 2023-07-29 — End: 2023-07-29
  Administered 2023-07-29: 1000 mL

## 2023-07-29 MED ORDER — FENTANYL CITRATE (PF) 100 MCG/2ML IJ SOLN: 25.0000 ug | Freq: Once | INTRAMUSCULAR | Status: DC

## 2023-07-29 MED ORDER — SODIUM CHLORIDE 0.9% FLUSH
3.0000 mL | Freq: Two times a day (BID) | INTRAVENOUS | Status: DC
  Administered 2023-07-29: 10 mL via INTRAVENOUS

## 2023-07-29 MED ORDER — ONDANSETRON HCL 4 MG/2ML IJ SOLN
INTRAMUSCULAR | Status: AC
  Filled 2023-07-29: qty 2

## 2023-07-29 MED ORDER — PHENYLEPHRINE 80 MCG/ML (10ML) SYRINGE FOR IV PUSH (FOR BLOOD PRESSURE SUPPORT)
PREFILLED_SYRINGE | INTRAVENOUS | Status: AC
  Filled 2023-07-29: qty 10

## 2023-07-29 MED ORDER — 0.9 % SODIUM CHLORIDE (POUR BTL) OPTIME
TOPICAL | Status: DC | PRN
  Administered 2023-07-29: 1000 mL

## 2023-07-29 MED ORDER — SODIUM CHLORIDE 0.9 % IV SOLN: INTRAVENOUS | Status: DC | PRN

## 2023-07-29 MED ORDER — FENTANYL CITRATE (PF) 100 MCG/2ML IJ SOLN
INTRAMUSCULAR | Status: AC
  Filled 2023-07-29: qty 2

## 2023-07-29 SURGICAL SUPPLY — 28 items
ARMBAND PINK RESTRICT EXTREMIT (MISCELLANEOUS) ×2 IMPLANT
BAG COUNTER SPONGE SURGICOUNT (BAG) ×2 IMPLANT
BLADE CLIPPER SURG (BLADE) ×2 IMPLANT
BNDG ELASTIC 4X5.8 VLCR STR LF (GAUZE/BANDAGES/DRESSINGS) ×2 IMPLANT
CANISTER SUCT 3000ML PPV (MISCELLANEOUS) ×2 IMPLANT
CLIP TI MEDIUM 6 (CLIP) ×4 IMPLANT
CLIP TI WIDE RED SMALL 6 (CLIP) ×3 IMPLANT
COVER PROBE W GEL 5X96 (DRAPES) ×2 IMPLANT
DERMABOND ADVANCED .7 DNX12 (GAUZE/BANDAGES/DRESSINGS) ×2 IMPLANT
ELECT REM PT RETURN 9FT ADLT (ELECTROSURGICAL) ×2 IMPLANT
ELECTRODE REM PT RTRN 9FT ADLT (ELECTROSURGICAL) ×1 IMPLANT
GLOVE BIOGEL PI IND STRL 8 (GLOVE) ×2 IMPLANT
GOWN STRL REUS W/ TWL LRG LVL3 (GOWN DISPOSABLE) ×4 IMPLANT
GOWN STRL REUS W/TWL 2XL LVL3 (GOWN DISPOSABLE) ×4 IMPLANT
KIT BASIN OR (CUSTOM PROCEDURE TRAY) ×2 IMPLANT
KIT TURNOVER KIT B (KITS) ×2 IMPLANT
NS IRRIG 1000ML POUR BTL (IV SOLUTION) ×2 IMPLANT
PACK CV ACCESS (CUSTOM PROCEDURE TRAY) ×2 IMPLANT
PAD ARMBOARD 7.5X6 YLW CONV (MISCELLANEOUS) ×4 IMPLANT
SET MICROPUNCTURE 5F STIFF (MISCELLANEOUS) ×1 IMPLANT
SUT MNCRL AB 4-0 PS2 18 (SUTURE) ×2 IMPLANT
SUT PDS AB 2-0 CT1 27 (SUTURE) ×1 IMPLANT
SUT PROLENE 6 0 BV (SUTURE) ×3 IMPLANT
SUT VIC AB 3-0 SH 27X BRD (SUTURE) ×2 IMPLANT
TOWEL GREEN STERILE (TOWEL DISPOSABLE) ×2 IMPLANT
UNDERPAD 30X36 HEAVY ABSORB (UNDERPADS AND DIAPERS) ×2 IMPLANT
WATER STERILE IRR 1000ML POUR (IV SOLUTION) ×2 IMPLANT
WIRE TORQFLEX AUST .018X40CM (WIRE) ×1 IMPLANT

## 2023-07-29 NOTE — Transfer of Care (Signed)
 Immediate Anesthesia Transfer of Care Note  Patient: Paul Bond  Procedure(s) Performed: FISTULOGRAM  Patient Location: PACU  Anesthesia Type:Regional  Level of Consciousness: drowsy, patient cooperative, and responds to stimulation  Airway & Oxygen Therapy: Patient Spontanous Breathing and Patient connected to nasal cannula oxygen  Post-op Assessment: Report given to RN and Post -op Vital signs reviewed and stable  Post vital signs: Reviewed and stable  Last Vitals:  Vitals Value Taken Time  BP    Temp    Pulse 88 07/29/23 1229  Resp 12 07/29/23 1229  SpO2 100 % 07/29/23 1229  Vitals shown include unfiled device data.  Last Pain:  Vitals:   07/29/23 1043  PainSc: 0-No pain         Complications: No notable events documented.

## 2023-07-29 NOTE — Anesthesia Postprocedure Evaluation (Signed)
 Anesthesia Post Note  Patient: Paul Bond  Procedure(s) Performed: FISTULOGRAM     Patient location during evaluation: PACU Anesthesia Type: Regional and MAC Level of consciousness: awake and alert Pain management: pain level controlled Vital Signs Assessment: post-procedure vital signs reviewed and stable Respiratory status: spontaneous breathing, nonlabored ventilation, respiratory function stable and patient connected to nasal cannula oxygen Cardiovascular status: stable and blood pressure returned to baseline Postop Assessment: no apparent nausea or vomiting Anesthetic complications: no  No notable events documented.  Last Vitals:  Vitals:   07/29/23 1245 07/29/23 1300  BP: (!) 99/55 (!) 102/55  Pulse: 86 85  Resp: 13 13  Temp:  (!) 36.4 C  SpO2: 100% 100%    Last Pain:  Vitals:   07/29/23 1230  PainSc: Asleep   Pain Goal:                   Muriel Hannold L Tawanda Schall

## 2023-07-29 NOTE — Op Note (Signed)
    NAME: Paul Bond    MRN: [id number] DOB: [birth year]    DATE OF OPERATION: 07/29/2023  PREOP DIAGNOSIS:    Left upper extremity fistula malfunction  POSTOP DIAGNOSIS:    Same  PROCEDURE:    Ultrasound-guided micropuncture access of the left brachiobasilic fistula Fistulogram   SURGEON: Victorino Sparrow  ASSIST: None  ANESTHESIA: Moderate, block  EBL: 5 mL  INDICATIONS:    Paul Bond is a 83 y.o. male well-known to my service having previously undergone left brachiobasilic fistula creation.  This was working well until recently.  The patient was taken to the Cath Lab with interventional nephrology in an effort to improve flow through the fistula.  Balloon angioplasty followed of the proximal anastomosis.  The fistula continued to shut down after plasty, and therefore he was sent to my office for new fistula creation.  On exam the office, there was a light thrill appreciated.  The fistula did not feel occluded.  We discussed AV graft creation versus proximal anastomotic revision.  On exam this morning, the fistula felt even better.  With compression, there was an excellent pulse in the fistula, and therefore did not think the proximal anastomosis had to the degree of narrowing appreciated prior angiogram.  We discussed a fistula revision if needed.  FINDINGS:   Widely patent left brachiobasilic fistula without inflow or outflow stenosis.  TECHNIQUE:   Patient is brought to the OR laid in supine position.  Monitored anesthesia was induced and the patient was prepped and draped in standard fashion.  An ultrasound was prepped down and the fistula and sedated from the proximal anastomosis through the axillary vein.  I did not appreciate significant narrowing.  Prior to coming down the proximal anastomosis, I elected to move to angiography and performed a left-sided fistulogram.  The C arm was prepped, and the left sided fistula was accessed using an ultrasound-guided  micropuncture needle.  Fistulogram followed.  See results above.  At this point, I broke scrub, and went to talk to the patient's daughter.  I told her that the fistula was widely patent, and there were no issues that I could appreciate that needed revision at this time.  I think Paul Bond would be best served with attempted fistula use.  She is aware that if the fistula is unable to be accessed for any reason, the only surgery I would offer would be fistula ligation, and an AV graft.  Victorino Sparrow, MD [facility] DATE OF DICTATION:   07/29/2023

## 2023-07-29 NOTE — Anesthesia Procedure Notes (Signed)
 Anesthesia Regional Block: Interscalene brachial plexus block   Pre-Anesthetic Checklist: , timeout performed,  Correct Patient, Correct Site, Correct Laterality,  Correct Procedure, Correct Position, site marked,  Risks and benefits discussed,  Surgical consent,  Pre-op evaluation,  At surgeon's request and post-op pain management  Laterality: Upper and Left  Prep: Maximum Sterile Barrier Precautions used, chloraprep       Needles:  Injection technique: Single-shot  Needle Type: Echogenic Needle     Needle Length: 5cm  Needle Gauge: 21     Additional Needles:   Procedures:,,,, ultrasound used (permanent image in chart),,    Narrative:  Start time: 07/29/2023 10:49 AM End time: 07/29/2023 10:55 AM Injection made incrementally with aspirations every 5 mL.  Performed by: Personally  Anesthesiologist: Trevor Iha, MD  Additional Notes: Block assessed prior to procedure. Patient tolerated procedure well.

## 2023-07-29 NOTE — H&P (Signed)
 Office Note     CC:  ESRD Requesting Provider:  No ref. provider found  HPI: Paul Bond is a Right handed 83 y.o. (1941-03-23) male with kidney disease who presents at the request of No ref. provider found for permanent HD access. The patient has had one prior access procedure -left upper extremity brachiobasilic fistula creation 01/2023.Marland Kitchen Per pt, previous tunneled lines have been placed in right IJ. Current access is right IJ.  On exam, Paul Bond was doing well, accompanied by his daughter.  His left upper extremity fistula continues to give him trouble with what appears to be anastomotic narrowing, and outflow stenosis.  He had no complaints on today's exam.    Past Medical History:  Diagnosis Date   Chronic indwelling Foley catheter    Chronic kidney disease    dialysis on tues thurs sat   Diabetes mellitus without complication (HCC)    type 2   Hypertension    Rectal cancer (HCC)    Stroke [facility])     Past Surgical History:  Procedure Laterality Date   A/V FISTULAGRAM Left 05/31/2023   Procedure: A/V Fistulagram;  Surgeon: Ethelene Hal, MD;  Location: [facility] INVASIVE CV LAB;  Service: Cardiovascular;  Laterality: Left;   A/V FISTULAGRAM N/A 07/17/2023   Procedure: A/V Fistulagram;  Surgeon: Ethelene Hal, MD;  Location: [facility] INVASIVE CV LAB;  Service: Cardiovascular;  Laterality: N/A;   AV FISTULA PLACEMENT Left 12/03/2022   Procedure: LEFT ARM ARTERIOVENOUS FISTULA CREATION;  Surgeon: Victorino Sparrow, MD;  Location: [facility] OR;  Service: Vascular;  Laterality: Left;   BASCILIC VEIN TRANSPOSITION Left 01/28/2023   Procedure: LEFT ARM SECOND STAGE BASILIC VEIN TRANSPOSITION;  Surgeon: Victorino Sparrow, MD;  Location: [facility] OR;  Service: Vascular;  Laterality: Left;   BIOPSY  02/18/2020   Procedure: BIOPSY;  Surgeon: Benancio Deeds, MD;  Location: [facility] ENDOSCOPY;  Service: Gastroenterology;;   COLON SURGERY     COLONOSCOPY  02/18/2020   COLONOSCOPY WITH PROPOFOL N/A 02/18/2020   Procedure:  COLONOSCOPY WITH PROPOFOL;  Surgeon: Benancio Deeds, MD;  Location: [facility] ENDOSCOPY;  Service: Gastroenterology;  Laterality: N/A;   CYSTOSCOPY W/ URETERAL STENT PLACEMENT Right 02/03/2020   Procedure: CYSTOSCOPY WITH RETROGRADE PYELOGRAM/URETERAL DOUBLE J STENT PLACEMENT;  Surgeon: Marcine Matar, MD;  Location: [facility] OR;  Service: Urology;  Laterality: Right;   DIALYSIS/PERMA CATHETER INSERTION N/A 07/17/2023   Procedure: DIALYSIS/PERMA CATHETER INSERTION;  Surgeon: Ethelene Hal, MD;  Location: [facility] INVASIVE CV LAB;  Service: Cardiovascular;  Laterality: N/A;   DIALYSIS/PERMA CATHETER REMOVAL N/A 05/22/2023   Procedure: DIALYSIS/PERMA CATHETER REMOVAL;  Surgeon: Ethelene Hal, MD;  Location: [facility] INVASIVE CV LAB;  Service: Cardiovascular;  Laterality: N/A;   ESOPHAGOGASTRODUODENOSCOPY (EGD) WITH PROPOFOL N/A 02/18/2020   Procedure: ESOPHAGOGASTRODUODENOSCOPY (EGD) WITH PROPOFOL;  Surgeon: Benancio Deeds, MD;  Location: [facility] ENDOSCOPY;  Service: Gastroenterology;  Laterality: N/A;   IR FLUORO GUIDE CV LINE RIGHT  02/14/2022   IR US GUIDE VASC ACCESS RIGHT  02/14/2022   PERIPHERAL VASCULAR BALLOON ANGIOPLASTY  05/31/2023   Procedure: PERIPHERAL VASCULAR BALLOON ANGIOPLASTY;  Surgeon: Ethelene Hal, MD;  Location: [facility] INVASIVE CV LAB;  Service: Cardiovascular;;  lt arm fistula   PERIPHERAL VASCULAR BALLOON ANGIOPLASTY  07/17/2023   Procedure: PERIPHERAL VASCULAR BALLOON ANGIOPLASTY;  Surgeon: Ethelene Hal, MD;  Location: [facility] INVASIVE CV LAB;  Service: Cardiovascular;;  Arterial Anastomosis; Outflow Basilic Vein   PERIPHERAL VASCULAR BALLOON ANGIOPLASTY  07/17/2023   Procedure: PERIPHERAL VASCULAR BALLOON  ANGIOPLASTY;  Surgeon: Ethelene Hal, MD;  Location: [facility] INVASIVE CV LAB;  Service: Cardiovascular;;  right innominate   POLYPECTOMY  02/18/2020   Procedure: POLYPECTOMY;  Surgeon: Benancio Deeds, MD;  Location: [facility] ENDOSCOPY;  Service: Gastroenterology;;    Social History   Socioeconomic History    Marital status: Married    Spouse name: Not on file   Number of children: Not on file   Years of education: Not on file   Highest education level: Not on file  Occupational History   Occupation: retired  Tobacco Use   Smoking status: Former    Current packs/day: 0.00    Average packs/day: 1 pack/day for 15.0 years (15.0 ttl pk-yrs)    Types: Cigarettes    Start date: 61    Quit date: 1974    Years since quitting: 51.1   Smokeless tobacco: Never  Vaping Use   Vaping status: Never Used  Substance and Sexual Activity   Alcohol use: Not Currently    Comment: quit in 1974, binge drinking prior   Drug use: Never   Sexual activity: Not Currently  Other Topics Concern   Not on file  Social History Narrative   Not on file   Social Drivers of Health   Financial Resource Strain: Not on file  Food Insecurity: No Food Insecurity (02/14/2022)   Hunger Vital Sign    Worried About Running Out of Food in the Last Year: Never true    Ran Out of Food in the Last Year: Never true  Transportation Needs: No Transportation Needs (02/14/2022)   PRAPARE - Administrator, Civil Service (Medical): No    Lack of Transportation (Non-Medical): No  Physical Activity: Not on file  Stress: Not on file  Social Connections: Not on file  Intimate Partner Violence: Not At Risk (02/14/2022)   Humiliation, Afraid, Rape, and Kick questionnaire    Fear of Current or Ex-Partner: No    Emotionally Abused: No    Physically Abused: No    Sexually Abused: No   History reviewed. No pertinent family history.  Current Facility-Administered Medications  Medication Dose Route Frequency Provider Last Rate Last Admin   ceFAZolin (ANCEF) IVPB 2g/100 mL premix  2 g Intravenous 30 min Pre-Op Victorino Sparrow, MD       chlorhexidine (HIBICLENS) 4 % liquid 4 Application  60 mL Topical Once Victorino Sparrow, MD       And   [START ON 07/30/2023] chlorhexidine (HIBICLENS) 4 % liquid 4 Application  60 mL Topical  Once Victorino Sparrow, MD       fentaNYL (SUBLIMAZE) 100 MCG/2ML injection            insulin aspart (novoLOG) injection 0-7 Units  0-7 Units Subcutaneous Q2H PRN Trevor Iha, MD       sodium chloride flush (NS) 0.9 % injection 3-10 mL  3-10 mL Intravenous Q12H Victorino Sparrow, MD   10 mL at 07/29/23 1047   sodium chloride flush (NS) 0.9 % injection 3-10 mL  3-10 mL Intravenous PRN Victorino Sparrow, MD        Allergies  Allergen Reactions   Lisinopril Cough   Semaglutide Other (See Comments)    Indigestion  *Ozempic     REVIEW OF SYSTEMS:   [X]  denotes positive finding, [ ]  denotes negative finding Cardiac  Comments:  Chest pain or chest pressure:    Shortness of breath upon exertion:    Short of  breath when lying flat:    Irregular heart rhythm:        Vascular    Pain in calf, thigh, or hip brought on by ambulation:    Pain in feet at night that wakes you up from your sleep:     Blood clot in your veins:    Leg swelling:         Pulmonary    Oxygen at home:    Productive cough:     Wheezing:         Neurologic    Sudden weakness in arms or legs:     Sudden numbness in arms or legs:     Sudden onset of difficulty speaking or slurred speech:    Temporary loss of vision in one eye:     Problems with dizziness:         Gastrointestinal    Blood in stool:     Vomited blood:         Genitourinary    Burning when urinating:     Blood in urine:        Psychiatric    Major depression:         Hematologic    Bleeding problems:    Problems with blood clotting too easily:        Skin    Rashes or ulcers:        Constitutional    Fever or chills:      PHYSICAL EXAMINATION:  Vitals:   07/29/23 1013  BP: (!) 108/59  Pulse: 81  Resp: 18  Temp: 97.6 F (36.4 C)  SpO2: 100%  Weight: 65.8 kg  Height: 5\' 7"  (1.702 m)    General:  WDWN in NAD; vital signs documented above Gait: Not observed HENT: WNL, normocephalic Pulmonary: normal non-labored  breathing , without Rales, rhonchi,  wheezing Cardiac: regular HR Abdomen: soft, NT, no masses Skin: without rashes Vascular Exam/Pulses:  Right Left  Radial 2+ (normal) 2+ (normal)  Ulnar 2+ (normal) 2+ (normal)                   Extremities: without ischemic changes, without Gangrene , without cellulitis; without open wounds;  Musculoskeletal: no muscle wasting or atrophy  Neurologic: A&O X 3;  No focal weakness or paresthesias are detected Psychiatric:  The pt has Normal affect.   Non-Invasive Vascular Imaging:   No usable superficial veins bilaterally    ASSESSMENT/PLAN:  KAHNER YANIK is a 83 y.o. male who presents with  fistula malfunction.  Interestingly, the fistula worked for quite some time, but is now having difficulties with inflow stenosis, possible outflow stenosis  Based on vein mapping and examination, options include left arm AV graft versus fistula revision.  I discussed left arm fistula revision-arterial anastomotic revision versus AV graft.  Cinch was amenable.   I had an extensive discussion with this patient in regards to the nature of access surgery, including risk, benefits, and alternatives.   The patient is aware that the risks of access surgery include but are not limited to: bleeding, infection, steal syndrome, nerve damage, ischemic monomelic neuropathy, failure of access to mature, complications related to venous hypertension, and possible need for additional access procedures in the future. After discussing risks and benefits of the above, Royden elected to proceed.  Victorino Sparrow, MD [facility] [phone]  Patient seen and examined in preop holding.  No complaints. No changes to medication history or physical exam since  last seen in clinic. After discussing the risks and benefits of left arm fistula revision v AV graft placement, Paul Bond elected to proceed.   Victorino Sparrow MD

## 2023-07-30 ENCOUNTER — Encounter (HOSPITAL_COMMUNITY): Payer: Self-pay | Admitting: Vascular Surgery

## 2023-08-27 ENCOUNTER — Other Ambulatory Visit: Payer: Self-pay | Admitting: Physician Assistant

## 2023-08-27 DIAGNOSIS — D472 Monoclonal gammopathy: Secondary | ICD-10-CM

## 2023-08-28 ENCOUNTER — Inpatient Hospital Stay: Payer: No Typology Code available for payment source | Admitting: Physician Assistant

## 2023-08-28 ENCOUNTER — Inpatient Hospital Stay: Payer: No Typology Code available for payment source | Attending: Physician Assistant

## 2023-08-28 ENCOUNTER — Telehealth: Payer: Self-pay

## 2023-08-28 NOTE — Telephone Encounter (Signed)
 Spoke with pt's daughter regarding pt's No Show appt for today.  Transferred call to scheduling but appt has not been rescheduled

## 2023-09-04 ENCOUNTER — Telehealth: Payer: Self-pay

## 2023-09-04 NOTE — Telephone Encounter (Signed)
 Triage :  Sherrilyn Rist, RN at [facility] called.  Pt's had decreased flow and possible arterial spasms.   -returned call to [facility].  She will talk to pt during HD tomorrow and discuss having another procedure with Dr. Karin Lieu.  May call to book an appt with Karin Lieu.   -pt currently has internal jugular access.

## 2023-10-10 ENCOUNTER — Encounter: Payer: Self-pay | Admitting: Physician Assistant

## 2023-10-18 ENCOUNTER — Other Ambulatory Visit: Payer: Self-pay | Admitting: *Deleted

## 2023-10-18 DIAGNOSIS — N186 End stage renal disease: Secondary | ICD-10-CM

## 2023-10-28 ENCOUNTER — Ambulatory Visit (HOSPITAL_COMMUNITY)
Admission: RE | Admit: 2023-10-28 | Discharge: 2023-10-28 | Disposition: A | Discharge status: Home / Self Care | Source: Ambulatory Visit | Attending: Surgery | Admitting: Surgery

## 2023-10-28 ENCOUNTER — Ambulatory Visit: Attending: Surgery | Admitting: Physician Assistant

## 2023-10-28 VITALS — BP 103/64 | HR 63 | Temp 98.3°F | Wt 145.0 lb

## 2023-10-28 DIAGNOSIS — N186 End stage renal disease: Secondary | ICD-10-CM | POA: Insufficient documentation

## 2023-10-28 NOTE — Progress Notes (Signed)
 Office Note     CC:  follow up Requesting Provider:  Charlotte Cookey, PA  HPI: Elford Evilsizer Gu[place]a is a 83 y.o. ([birth year]) male who presents for reevaluation of left arm AV fistula.  He has numerous access procedures including left forearm loop graft, two-stage basilic vein transposition.  He has had numerous fistulograms on basilic vein however this has not been a durable access.  Dr. Christia Cowboy mention after last fistulogram he would recommend conversion to upper arm AV graft if cannulation problems continued.  He has been dialyzing via right IJ TDC over the past 2 months.  He dialyzes on a Tuesday Thursday Saturday schedule.   Past Medical History:  Diagnosis Date   Chronic indwelling Foley catheter    Chronic kidney disease    dialysis on tues thurs sat   Diabetes mellitus without complication (HCC)    type 2   Hypertension    Rectal cancer (HCC)    Stroke [facility])     Past Surgical History:  Procedure Laterality Date   A/V FISTULAGRAM Left 05/31/2023   Procedure: A/V Fistulagram;  Surgeon: Patrick Boor, MD;  Location: [facility] INVASIVE CV LAB;  Service: Cardiovascular;  Laterality: Left;   A/V FISTULAGRAM N/A 07/17/2023   Procedure: A/V Fistulagram;  Surgeon: Patrick Boor, MD;  Location: [facility] INVASIVE CV LAB;  Service: Cardiovascular;  Laterality: N/A;   AV FISTULA PLACEMENT Left 12/03/2022   Procedure: LEFT ARM ARTERIOVENOUS FISTULA CREATION;  Surgeon: Kayla Part, MD;  Location: [facility] OR;  Service: Vascular;  Laterality: Left;   BASCILIC VEIN TRANSPOSITION Left 01/28/2023   Procedure: LEFT ARM SECOND STAGE BASILIC VEIN TRANSPOSITION;  Surgeon: Kayla Part, MD;  Location: [facility] OR;  Service: Vascular;  Laterality: Left;   BIOPSY  02/18/2020   Procedure: BIOPSY;  Surgeon: Ace Holder, MD;  Location: [facility] ENDOSCOPY;  Service: Gastroenterology;;   COLON SURGERY     COLONOSCOPY  02/18/2020   COLONOSCOPY WITH PROPOFOL  N/A 02/18/2020   Procedure: COLONOSCOPY WITH PROPOFOL ;  Surgeon:  Ace Holder, MD;  Location: [facility] ENDOSCOPY;  Service: Gastroenterology;  Laterality: N/A;   CYSTOSCOPY W/ URETERAL STENT PLACEMENT Right 02/03/2020   Procedure: CYSTOSCOPY WITH RETROGRADE PYELOGRAM/URETERAL DOUBLE J STENT PLACEMENT;  Surgeon: Trent Frizzle, MD;  Location: [facility] OR;  Service: Urology;  Laterality: Right;   DIALYSIS/PERMA CATHETER INSERTION N/A 07/17/2023   Procedure: DIALYSIS/PERMA CATHETER INSERTION;  Surgeon: Patrick Boor, MD;  Location: [facility] INVASIVE CV LAB;  Service: Cardiovascular;  Laterality: N/A;   DIALYSIS/PERMA CATHETER REMOVAL N/A 05/22/2023   Procedure: DIALYSIS/PERMA CATHETER REMOVAL;  Surgeon: Patrick Boor, MD;  Location: [facility] INVASIVE CV LAB;  Service: Cardiovascular;  Laterality: N/A;   ESOPHAGOGASTRODUODENOSCOPY (EGD) WITH PROPOFOL  N/A 02/18/2020   Procedure: ESOPHAGOGASTRODUODENOSCOPY (EGD) WITH PROPOFOL ;  Surgeon: Ace Holder, MD;  Location: [facility] ENDOSCOPY;  Service: Gastroenterology;  Laterality: N/A;   FISTULOGRAM  07/29/2023   Procedure: FISTULOGRAM;  Surgeon: Kayla Part, MD;  Location: [facility] OR;  Service: Vascular;;   IR FLUORO GUIDE CV LINE RIGHT  02/14/2022   IR US  GUIDE VASC ACCESS RIGHT  02/14/2022   PERIPHERAL VASCULAR BALLOON ANGIOPLASTY  05/31/2023   Procedure: PERIPHERAL VASCULAR BALLOON ANGIOPLASTY;  Surgeon: Patrick Boor, MD;  Location: [facility] INVASIVE CV LAB;  Service: Cardiovascular;;  lt arm fistula   PERIPHERAL VASCULAR BALLOON ANGIOPLASTY  07/17/2023   Procedure: PERIPHERAL VASCULAR BALLOON ANGIOPLASTY;  Surgeon: Patrick Boor, MD;  Location: [facility] INVASIVE CV LAB;  Service: Cardiovascular;;  Arterial Anastomosis; Outflow Basilic  Vein   PERIPHERAL VASCULAR BALLOON ANGIOPLASTY  07/17/2023   Procedure: PERIPHERAL VASCULAR BALLOON ANGIOPLASTY;  Surgeon: Patrick Boor, MD;  Location: [facility] INVASIVE CV LAB;  Service: Cardiovascular;;  right innominate   POLYPECTOMY  02/18/2020   Procedure: POLYPECTOMY;  Surgeon: Ace Holder, MD;  Location: [facility]  ENDOSCOPY;  Service: Gastroenterology;;    Social History   Socioeconomic History   Marital status: Married    Spouse name: Not on file   Number of children: Not on file   Years of education: Not on file   Highest education level: Not on file  Occupational History   Occupation: retired  Tobacco Use   Smoking status: Former    Current packs/day: 0.00    Average packs/day: 1 pack/day for 15.0 years (15.0 ttl pk-yrs)    Types: Cigarettes    Start date: 55    Quit date: 1974    Years since quitting: 51.4   Smokeless tobacco: Never  Vaping Use   Vaping status: Never Used  Substance and Sexual Activity   Alcohol  use: Not Currently    Comment: quit in 1974, binge drinking prior   Drug use: Never   Sexual activity: Not Currently  Other Topics Concern   Not on file  Social History Narrative   Not on file   Social Drivers of Health   Financial Resource Strain: Not on file  Food Insecurity: No Food Insecurity (02/14/2022)   Hunger Vital Sign    Worried About Running Out of Food in the Last Year: Never true    Ran Out of Food in the Last Year: Never true  Transportation Needs: No Transportation Needs (02/14/2022)   PRAPARE - Administrator, Civil Service (Medical): No    Lack of Transportation (Non-Medical): No  Physical Activity: Not on file  Stress: Not on file  Social Connections: Not on file  Intimate Partner Violence: Not At Risk (02/14/2022)   Humiliation, Afraid, Rape, and Kick questionnaire    Fear of Current or Ex-Partner: No    Emotionally Abused: No    Physically Abused: No    Sexually Abused: No   History reviewed. No pertinent family history.  Current Outpatient Medications  Medication Sig Dispense Refill   cinacalcet (SENSIPAR) 30 MG tablet Take 30 mg by mouth daily. Tues, Thurs, and Sat     Ensure (ENSURE) Take 237 mLs by mouth 2 (two) times daily.     glucose 4 GM chewable tablet Chew 4 tablets by mouth daily as needed for low blood sugar.  for  low blood sugar, repeat every 15 minutes if blood sugar is less than 70     insulin  glargine (LANTUS ) 100 UNIT/ML injection Inject 8 Units into the skin every morning.     latanoprost  (XALATAN ) 0.005 % ophthalmic solution Place 1 drop into both eyes every evening.     sevelamer  carbonate (RENVELA ) 800 MG tablet Take 800 mg by mouth 3 (three) times daily before meals.     traMADol  (ULTRAM ) 50 MG tablet Take 1 tablet (50 mg total) by mouth every 6 (six) hours as needed. (Patient taking differently: Take 50 mg by mouth every 6 (six) hours as needed for moderate pain (pain score 4-6).) 20 tablet 0   UNABLE TO FIND Take 1 fluid ounce by mouth daily. Med Name: Refugio Cantor   food supplement     No current facility-administered medications for this visit.    Allergies  Allergen Reactions   Lisinopril Cough  Semaglutide Other (See Comments)    Indigestion  *Ozempic     REVIEW OF SYSTEMS:   [X]  denotes positive finding, [ ]  denotes negative finding Cardiac  Comments:  Chest pain or chest pressure:    Shortness of breath upon exertion:    Short of breath when lying flat:    Irregular heart rhythm:        Vascular    Pain in calf, thigh, or hip brought on by ambulation:    Pain in feet at night that wakes you up from your sleep:     Blood clot in your veins:    Leg swelling:         Pulmonary    Oxygen at home:    Productive cough:     Wheezing:         Neurologic    Sudden weakness in arms or legs:     Sudden numbness in arms or legs:     Sudden onset of difficulty speaking or slurred speech:    Temporary loss of vision in one eye:     Problems with dizziness:         Gastrointestinal    Blood in stool:     Vomited blood:         Genitourinary    Burning when urinating:     Blood in urine:        Psychiatric    Major depression:         Hematologic    Bleeding problems:    Problems with blood clotting too easily:        Skin    Rashes or ulcers:         Constitutional    Fever or chills:      PHYSICAL EXAMINATION:  Vitals:   10/28/23 1233  BP: 103/64  Pulse: 63  Temp: 98.3 F (36.8 C)  TempSrc: Temporal  SpO2: 99%  Weight: 145 lb (65.8 kg)    General:  WDWN in NAD; vital signs documented above Gait: Not observed HENT: WNL, normocephalic Pulmonary: normal non-labored breathing Cardiac: regular HR Abdomen: soft, NT, no masses Skin: without rashes Vascular Exam/Pulses: palpable L radial 1+ Extremities: no thrill in L arm AVF Musculoskeletal: no muscle wasting or atrophy  Neurologic: A&O X 3 Psychiatric:  The pt has Normal affect.   Non-Invasive Vascular Imaging:   Low flow volume through AV fistula with small diameter and thrombus noted near the arterial anastomosis    ASSESSMENT/PLAN:: 83 y.o. male here for follow up for evaluation of ongoing difficulty with left arm AV fistula  Mr. Kolton is an 83 year old male who underwent two-stage basilic vein transposition last year.  He has had numerous fistulogram's since however the fistula has only been able to be used for a short period of time.  He has been dialyzing exclusively from right IJ [facility] over the past several months.  Duplex demonstrates thrombus near the arterial anastomosis with low flow volume throughout the fistula.  I discussed 2 options moving forward with Mr. Horen and his daughter.  Option 1 would be to proceed with left upper arm AV graft placement.  Option 2 would be to continue HD via [facility] indefinitely.  He would like to try 1 more time to have a functional access prior to becoming catheter dependent.  He will be scheduled for left arm AV graft with Dr. Rosalva Comber on a nondialysis day in the near future.  Case was discussed in detail with the patient  and his daughter and they are agreeable to proceed.  I recorded his phone number today and he will be contacted in the next few days by our office to schedule his surgery.   Cordie Deters, PA-C [facility] [phone]  Clinic MD:   Charlotte Cookey

## 2023-10-29 ENCOUNTER — Other Ambulatory Visit: Payer: Self-pay

## 2023-10-29 DIAGNOSIS — N186 End stage renal disease: Secondary | ICD-10-CM

## 2023-11-15 ENCOUNTER — Encounter (HOSPITAL_COMMUNITY): Payer: Self-pay | Admitting: Vascular Surgery

## 2023-11-15 ENCOUNTER — Other Ambulatory Visit: Payer: Self-pay

## 2023-11-15 NOTE — Progress Notes (Signed)
 PCP - Kinnie Penton, MD Cardiologist - [facility]   Chest x-ray - 06/03/23 EKG - 07/29/23 ECHO - 02/03/20  Fasting Blood Sugar - 80-120 Checks Blood Sugar 2/day  Anesthesia review: Y  Patient verbally denies any shortness of breath, fever, cough and chest pain during phone call   -------------  SDW INSTRUCTIONS given:  Your procedure is scheduled on Monday, June 9th.  Report to [facility] Main Entrance "A" at 0730 A.M., and check in at the Admitting office.  Call this number if you have problems the morning of surgery:  [phone]   Remember:  Do not eat or drink after midnight the night before your surgery    Take these medicines the morning of surgery with A SIP OF WATER   traMADol  (ULTRAM )-if needed  **Lantus **-Take four units the morning of surgery  ** PLEASE check your blood sugar the morning of your surgery when you wake up and every 2 hours until you get to the Short Stay unit.  If your blood sugar is less than 70 mg/dL, you will need to treat for low blood sugar: Do not take insulin . Treat a low blood sugar (less than 70 mg/dL) with  cup of clear juice (cranberry or apple), 4 glucose tablets, OR glucose gel. Recheck blood sugar in 15 minutes after treatment (to make sure it is greater than 70 mg/dL). If your blood sugar is not greater than 70 mg/dL on recheck, call [phone] for further instructions.  As of today, STOP taking any Aspirin (unless otherwise instructed by your surgeon) Aleve, Naproxen, Ibuprofen, Motrin, Advil, Goody's, BC's, all herbal medications, fish oil, and all vitamins.                      Do not wear jewelry, make up, or nail polish            Do not wear lotions, powders, perfumes/colognes, or deodorant.            Do not shave 48 hours prior to surgery.  Men may shave face and neck.            Do not bring valuables to the hospital.            [facility] is not responsible for any belongings or valuables.  Do NOT Smoke  (Tobacco/Vaping) 24 hours prior to your procedure If you use a CPAP at night, you may bring all equipment for your overnight stay.   Contacts, glasses, dentures or bridgework may not be worn into surgery.      For patients admitted to the hospital, discharge time will be determined by your treatment team.   Patients discharged the day of surgery will not be allowed to drive home, and someone needs to stay with them for 24 hours.    Special instructions:   [place]- Preparing For Surgery  Before surgery, you can play an important role. Because skin is not sterile, your skin needs to be as free of germs as possible. You can reduce the number of germs on your skin by washing with CHG (chlorahexidine gluconate) Soap before surgery.  CHG is an antiseptic cleaner which kills germs and bonds with the skin to continue killing germs even after washing.    Oral Hygiene is also important to reduce your risk of infection.  Remember - BRUSH YOUR TEETH THE MORNING OF SURGERY WITH YOUR REGULAR TOOTHPASTE  Please do not use if you have an allergy to CHG or antibacterial  soaps. If your skin becomes reddened/irritated stop using the CHG.  Do not shave (including legs and underarms) for at least 48 hours prior to first CHG shower. It is OK to shave your face.  Please follow these instructions carefully.   Shower the NIGHT BEFORE SURGERY and the MORNING OF SURGERY with DIAL  Soap.   Pat yourself dry with a CLEAN TOWEL.  Wear CLEAN PAJAMAS to bed the night before surgery  Place CLEAN SHEETS on your bed the night of your first shower and DO NOT SLEEP WITH PETS.   Day of Surgery: Please shower morning of surgery  Wear Clean/Comfortable clothing the morning of surgery Do not apply any deodorants/lotions.   Remember to brush your teeth WITH YOUR REGULAR TOOTHPASTE.   Questions were answered. Patient verbalized understanding of instructions.      '

## 2023-11-15 NOTE — Progress Notes (Signed)
 Anesthesia Chart Review: SAME DAY WORK-UP  Case: [id number] Date/Time: 11/18/23 0943   Procedure: INSERTION, GRAFT, ARTERIOVENOUS, UPPER EXTREMITY (Left)   Anesthesia type: Monitor Anesthesia Care   Diagnosis: ESRD (end stage renal disease) (HCC) [N18.6]   Pre-op  diagnosis: ESRD   [facility] / [facility]   Surgeons: Kayla Part, MD       DISCUSSION: Patient is an 83 year old male scheduled for the above procedure. S/p Left arm brachiobasilic fistula transposition on 01/28/23 but . He has had not worked well for HD and is now using a right internal jugular Palindrome TDC. 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  History of chronic Foley catheter.  S/p cystoscopy, right stent exchange/SPT exchange for right ureteral stricture, urinary retention on 06/26/23 [facility]).   PAF with RVR 02/03/20 (in setting of severe sepsis due to Morganella bacteremia/UTI with acute on chronic renal failure (with previous left nephrectomy from [facility]), acute on chronic anemia, recent + COVID-19.    He has a same-day workup, so labs and anesthesia team evaluation on the day of surgery.    VS: Ht 5\' 7"  (1.702 m)   Wt 65.8 kg   BMI 22.71 kg/m  BP Readings from Last 3 Encounters:  10/28/23 103/64  07/29/23 (!) 102/55  07/25/23 (!) 111/59   Pulse Readings from Last 3 Encounters:  10/28/23 63  07/29/23 85  07/25/23 73     PROVIDERS: Kinnie Penton, MD is nephrologist [facility])   LABS: For day of surgery as indicated. As of 11/14/23, lab results from the [facility] (see Care Everywhere) include intact PTH 440.1, albumin 3.8, total bilirubin 0.2, calcium  8.9, alkaline phosphatase 88, ALT less than 7, AST 12, glucose 172, sodium 139, potassium 4.3, phosphate 4.2, hemoglobin 9, hematocrit 26.1, platelet count 213 as of 07/29/23 HGB 9.5.     IMAGES: MRI Abd  05/17/23 [facility]): Impression: Slight intrahepatic biliary dilation. No obvious obstructing  lesion on noncontrast MRI.      EKG:  EKG 07/29/23: Sinus rhythm with sinus arrhythmia with Fusion complexes Left axis deviation Confirmed by Harvie Liner [phone]) on 07/29/2023 9:18:54 PM    CV: Echo (Limited) 02/03/20: IMPRESSIONS   1. The patient has no acoustic windows for TTE. The sonographer attempted  to find cardiac structures for nearly 15 minutes. There were no  parasternal, apical, or subcostal windows. There is nothing that can be  said about cardiac stucture or function due   to non-diagnostic images. Recommend alternative means to assess cardiac  function if indicated.   2. Left ventricular endocardial border not optimally defined to evaluate  regional wall motion.  FINDINGS   Left Ventricle: Left ventricular endocardial border not optimally defined  to evaluate regional wall motion.     Past Medical History:  Diagnosis Date   Chronic indwelling Foley catheter    Chronic kidney disease    dialysis on tues thurs sat   Diabetes mellitus without complication (HCC)    type 2   Hypertension    Rectal cancer (HCC)    Stroke [facility])     Past Surgical History:  Procedure Laterality Date   A/V FISTULAGRAM Left 05/31/2023   Procedure: A/V Fistulagram;  Surgeon: Patrick Boor, MD;  Location: [facility] INVASIVE CV LAB;  Service: Cardiovascular;  Laterality: Left;   A/V FISTULAGRAM  N/A 07/17/2023   Procedure: A/V Fistulagram;  Surgeon: Patrick Boor, MD;  Location: [facility] INVASIVE CV LAB;  Service: Cardiovascular;  Laterality: N/A;   AV FISTULA PLACEMENT Left 12/03/2022   Procedure: LEFT ARM ARTERIOVENOUS FISTULA CREATION;  Surgeon: Kayla Part, MD;  Location: [facility] OR;  Service: Vascular;  Laterality: Left;   BASCILIC VEIN TRANSPOSITION Left 01/28/2023   Procedure: LEFT ARM SECOND STAGE BASILIC VEIN TRANSPOSITION;  Surgeon: Kayla Part, MD;  Location: [facility] OR;  Service: Vascular;   Laterality: Left;   BIOPSY  02/18/2020   Procedure: BIOPSY;  Surgeon: Ace Holder, MD;  [facility] ENDOSCOPY;  Service: Gastroenterology;;   COLON SURGERY     COLONOSCOPY  02/18/2020   COLONOSCOPY WITH PROPOFOL  N/A 02/18/2020   Procedure: COLONOSCOPY WITH PROPOFOL ;  Surgeon: Ace Holder, MD;  Location: [facility] ENDOSCOPY;  Service: Gastroenterology;  Laterality: N/A;   CYSTOSCOPY W/ URETERAL STENT PLACEMENT Right 02/03/2020   Procedure: CYSTOSCOPY WITH RETROGRADE PYELOGRAM/URETERAL DOUBLE J STENT PLACEMENT;  Surgeon: Trent Frizzle, MD;  Location: [facility] OR;  Service: Urology;  Laterality: Right;   DIALYSIS/PERMA CATHETER INSERTION N/A 07/17/2023   Procedure: DIALYSIS/PERMA CATHETER INSERTION;  Surgeon: Patrick Boor, MD;  Location: [facility] INVASIVE CV LAB;  Service: Cardiovascular;  Laterality: N/A;   DIALYSIS/PERMA CATHETER REMOVAL N/A 05/22/2023   Procedure: DIALYSIS/PERMA CATHETER REMOVAL;  Surgeon: Patrick Boor, MD;  Location: [facility] INVASIVE CV LAB;  Service: Cardiovascular;  Laterality: N/A;   ESOPHAGOGASTRODUODENOSCOPY (EGD) WITH PROPOFOL  N/A 02/18/2020   Procedure: ESOPHAGOGASTRODUODENOSCOPY (EGD) WITH PROPOFOL ;  Surgeon: Ace Holder, MD;  Location: [facility] ENDOSCOPY;  Service: Gastroenterology;  Laterality: N/A;   FISTULOGRAM  07/29/2023   Procedure: FISTULOGRAM;  Surgeon: Kayla Part, MD;  Location: [facility] OR;  Service: Vascular;;   IR FLUORO GUIDE CV LINE RIGHT  02/14/2022   IR US  GUIDE VASC ACCESS RIGHT  02/14/2022   PERIPHERAL VASCULAR BALLOON ANGIOPLASTY  05/31/2023   Procedure: PERIPHERAL VASCULAR BALLOON ANGIOPLASTY;  Surgeon: Patrick Boor, MD;  [facility] INVAS[facility] CV LAB;  Service: Cardiovascular;;  lt arm fistula   PERIPHERAL VASCULAR BALLOON ANGIOPLASTY  07/17/2023   Procedure: PERIPHERAL VASCULAR BALLOON ANGIOPLASTY;  Surgeon: Patrick Boor, MD;  [facility] INVASIVE CV LAB;  Service: Cardiovascular;;  Arterial Anastomosis; Outflow Basilic Vein   PERIPHERAL VASCULAR BALLOON  ANGIOPLASTY  07/17/2023   Procedure: PERIPHERAL VASCULAR BALLOON ANGIOPLASTY;  Surgeon: Patrick Boor, MD;  Location: [facility] INVASIVE CV LAB;  Service: Cardiovascular;;  right innominate   POLYPECTOMY  02/18/2020   Procedure: POLYPECTOMY;  Surgeon: Ace Holder, MD;  Location: [facility] ENDOSCOPY;  Service: Gastroenterology;;    MEDICATIONS: No current facility-administered medications for this encounter.    cinacalcet (SENSIPAR) 30 MG tablet   Ensure (ENSURE)   glucose 4 GM chewable tablet   insulin  glargine (LANTUS ) 100 UNIT/ML injection   latanoprost  (XALATAN ) 0.005 % ophthalmic solution   sevelamer  carbonate (RENVELA ) 800 MG tablet   traMADol  (ULTRAM ) 50 MG tablet   UNABLE TO FIND   Ella Gun, PA-C Surgical Short Stay/Anesthesiology [facility] Phone [phone] [facility] Phone [phone] 11/15/2023 10:43 AM

## 2023-11-15 NOTE — Anesthesia Preprocedure Evaluation (Addendum)
 Anesthesia Evaluation  Patient identified by MRN, date of birth, ID band Patient awake    Reviewed: Allergy & Precautions, NPO status , Patient's Chart, lab work & pertinent test results  History of Anesthesia Complications Negative for: history of anesthetic complications  Airway Mallampati: I  TM Distance: >3 FB Neck ROM: Full    Dental  (+) Dental Advisory Given, Edentulous Upper, Edentulous Lower   Pulmonary neg shortness of breath, neg COPD, neg recent URI, former smoker   breath sounds clear to auscultation       Cardiovascular hypertension, (-) angina (-) Past MI  Rhythm:Regular     Neuro/Psych neg Seizures CVA, No Residual Symptoms    GI/Hepatic PUD,,,  Endo/Other  diabetes, Type 2    Renal/GU ESRF and DialysisRenal diseaseLab Results      Component                Value               Date                      NA                       136                 11/18/2023                K                        4.6                 11/18/2023                CO2                      29                  02/27/2023                GLUCOSE                  165 (H)             11/18/2023                BUN                      42 (H)              11/18/2023                CREATININE               8.50 (H)            11/18/2023                CALCIUM                   8.4 (L)             02/27/2023                GFRNONAA                 8 (L)               02/27/2023  Musculoskeletal negative musculoskeletal ROS (+)    Abdominal   Peds  Hematology  (+) Blood dyscrasia, anemia Lab Results      Component                Value               Date                      WBC                      6.5                 02/27/2023                HGB                      8.8 (L)             11/18/2023                HCT                      26.0 (L)            11/18/2023                MCV                      94.9                 02/27/2023                PLT                      229                 02/27/2023              Anesthesia Other Findings   Reproductive/Obstetrics                              Anesthesia Physical Anesthesia Plan  ASA: 3  Anesthesia Plan: General   Post-op Pain Management: Minimal or no pain anticipated   Induction: Intravenous  PONV Risk Score and Plan: 2 and Ondansetron   Airway Management Planned: LMA and Oral ETT  Additional Equipment: None  Intra-op Plan:   Post-operative Plan: Extubation in OR  Informed Consent: I have reviewed the patients History and Physical, chart, labs and discussed the procedure including the risks, benefits and alternatives for the proposed anesthesia with the patient or authorized representative who has indicated his/her understanding and acceptance.     Dental advisory given  Plan Discussed with: CRNA  Anesthesia Plan Comments: (PAT note written 11/15/2023 by Allison Zelenak, PA-C.  )        Anesthesia Quick Evaluation

## 2023-11-18 ENCOUNTER — Other Ambulatory Visit (HOSPITAL_COMMUNITY): Payer: Self-pay

## 2023-11-18 ENCOUNTER — Ambulatory Visit (HOSPITAL_COMMUNITY): Payer: Self-pay | Admitting: Vascular Surgery

## 2023-11-18 ENCOUNTER — Other Ambulatory Visit: Payer: Self-pay

## 2023-11-18 ENCOUNTER — Ambulatory Visit (HOSPITAL_COMMUNITY)
Admission: RE | Admit: 2023-11-18 | Discharge: 2023-11-18 | Disposition: A | Discharge status: Home / Self Care | Attending: Vascular Surgery | Admitting: Vascular Surgery

## 2023-11-18 ENCOUNTER — Encounter (HOSPITAL_COMMUNITY)
Admission: RE | Disposition: A | Discharge status: Home / Self Care | Payer: Self-pay | Source: Home / Self Care | Attending: Vascular Surgery

## 2023-11-18 DIAGNOSIS — N186 End stage renal disease: Secondary | ICD-10-CM

## 2023-11-18 DIAGNOSIS — Z85048 Personal history of other malignant neoplasm of rectum, rectosigmoid junction, and anus: Secondary | ICD-10-CM | POA: Insufficient documentation

## 2023-11-18 DIAGNOSIS — T82898A Other specified complication of vascular prosthetic devices, implants and grafts, initial encounter: Secondary | ICD-10-CM | POA: Diagnosis not present

## 2023-11-18 DIAGNOSIS — Z992 Dependence on renal dialysis: Secondary | ICD-10-CM

## 2023-11-18 DIAGNOSIS — I120 Hypertensive chronic kidney disease with stage 5 chronic kidney disease or end stage renal disease: Secondary | ICD-10-CM | POA: Diagnosis not present

## 2023-11-18 DIAGNOSIS — E1122 Type 2 diabetes mellitus with diabetic chronic kidney disease: Secondary | ICD-10-CM | POA: Insufficient documentation

## 2023-11-18 DIAGNOSIS — Z8673 Personal history of transient ischemic attack (TIA), and cerebral infarction without residual deficits: Secondary | ICD-10-CM | POA: Diagnosis not present

## 2023-11-18 DIAGNOSIS — Z8711 Personal history of peptic ulcer disease: Secondary | ICD-10-CM | POA: Insufficient documentation

## 2023-11-18 DIAGNOSIS — Z87891 Personal history of nicotine dependence: Secondary | ICD-10-CM | POA: Insufficient documentation

## 2023-11-18 DIAGNOSIS — Z9889 Other specified postprocedural states: Secondary | ICD-10-CM | POA: Diagnosis not present

## 2023-11-18 HISTORY — PX: LIGATION OF ARTERIOVENOUS  FISTULA: SHX5948

## 2023-11-18 HISTORY — PX: INSERTION OF ARTERIOVENOUS (AV) ARTEGRAFT ARM: SHX6779

## 2023-11-18 HISTORY — DX: Cardiac arrhythmia, unspecified: I49.9

## 2023-11-18 LAB — GLUCOSE, CAPILLARY
Glucose-Capillary: 142 mg/dL — ABNORMAL HIGH (ref 70–99)
Glucose-Capillary: 154 mg/dL — ABNORMAL HIGH (ref 70–99)
Glucose-Capillary: 165 mg/dL — ABNORMAL HIGH (ref 70–99)

## 2023-11-18 LAB — POCT I-STAT, CHEM 8
BUN: 42 mg/dL — ABNORMAL HIGH (ref 8–23)
Calcium, Ion: 1.05 mmol/L — ABNORMAL LOW (ref 1.15–1.40)
Chloride: 96 mmol/L — ABNORMAL LOW (ref 98–111)
Creatinine, Ser: 8.5 mg/dL — ABNORMAL HIGH (ref 0.61–1.24)
Glucose, Bld: 165 mg/dL — ABNORMAL HIGH (ref 70–99)
HCT: 26 % — ABNORMAL LOW (ref 39.0–52.0)
Hemoglobin: 8.8 g/dL — ABNORMAL LOW (ref 13.0–17.0)
Potassium: 4.6 mmol/L (ref 3.5–5.1)
Sodium: 136 mmol/L (ref 135–145)
TCO2: 28 mmol/L (ref 22–32)

## 2023-11-18 SURGERY — INSERTION, GRAFT, ARTERIOVENOUS, UPPER EXTREMITY
Anesthesia: General | Laterality: Left

## 2023-11-18 MED ORDER — HEPARIN 6000 UNIT IRRIGATION SOLUTION
Status: AC
  Filled 2023-11-18: qty 500

## 2023-11-18 MED ORDER — FENTANYL CITRATE (PF) 100 MCG/2ML IJ SOLN: 25.0000 ug | INTRAMUSCULAR | Status: DC | PRN

## 2023-11-18 MED ORDER — HEPARIN SODIUM (PORCINE) 1000 UNIT/ML IJ SOLN
INTRAMUSCULAR | Status: DC | PRN
  Administered 2023-11-18: 5000 [IU] via INTRAVENOUS

## 2023-11-18 MED ORDER — DEXAMETHASONE SODIUM PHOSPHATE 10 MG/ML IJ SOLN
INTRAMUSCULAR | Status: AC
  Filled 2023-11-18: qty 1

## 2023-11-18 MED ORDER — PROTAMINE SULFATE 10 MG/ML IV SOLN
INTRAVENOUS | Status: DC | PRN
Start: 2023-11-18 — End: 2023-11-18
  Administered 2023-11-18: 20 mg via INTRAVENOUS
  Administered 2023-11-18: 10 mg via INTRAVENOUS

## 2023-11-18 MED ORDER — DEXAMETHASONE SODIUM PHOSPHATE 10 MG/ML IJ SOLN
INTRAMUSCULAR | Status: DC | PRN
  Administered 2023-11-18: 5 mg via INTRAVENOUS

## 2023-11-18 MED ORDER — ACETAMINOPHEN 10 MG/ML IV SOLN: 1000.0000 mg | Freq: Once | INTRAVENOUS | Status: DC | PRN

## 2023-11-18 MED ORDER — SODIUM CHLORIDE 0.9 % IV SOLN
20.0000 ug | Freq: Once | INTRAVENOUS | Status: AC
  Administered 2023-11-18: 20 ug via INTRAVENOUS
  Filled 2023-11-18: qty 5

## 2023-11-18 MED ORDER — SODIUM CHLORIDE 0.9 % IV SOLN: INTRAVENOUS | Status: DC | PRN

## 2023-11-18 MED ORDER — SURGIFLO WITH THROMBIN (HEMOSTATIC MATRIX KIT) OPTIME
TOPICAL | Status: DC | PRN
  Administered 2023-11-18: 1 via TOPICAL

## 2023-11-18 MED ORDER — SODIUM CHLORIDE 0.9% FLUSH: 3.0000 mL | INTRAVENOUS | Status: DC | PRN

## 2023-11-18 MED ORDER — CHLORHEXIDINE GLUCONATE 4 % EX SOLN: 60.0000 mL | Freq: Once | CUTANEOUS | Status: DC

## 2023-11-18 MED ORDER — FENTANYL CITRATE (PF) 250 MCG/5ML IJ SOLN
INTRAMUSCULAR | Status: AC
  Filled 2023-11-18: qty 5

## 2023-11-18 MED ORDER — LIDOCAINE 2% (20 MG/ML) 5 ML SYRINGE
INTRAMUSCULAR | Status: DC | PRN
  Administered 2023-11-18: 60 mg via INTRAVENOUS

## 2023-11-18 MED ORDER — 0.9 % SODIUM CHLORIDE (POUR BTL) OPTIME
TOPICAL | Status: DC | PRN
  Administered 2023-11-18: 1000 mL

## 2023-11-18 MED ORDER — CHLORHEXIDINE GLUCONATE CLOTH 2 % EX PADS: 6.0000 | MEDICATED_PAD | Freq: Every day | CUTANEOUS | Status: DC

## 2023-11-18 MED ORDER — HEPARIN SODIUM (PORCINE) 1000 UNIT/ML IJ SOLN
INTRAMUSCULAR | Status: AC
  Filled 2023-11-18: qty 1

## 2023-11-18 MED ORDER — SODIUM CHLORIDE 0.9% FLUSH: 10.0000 mL | INTRAVENOUS | Status: DC | PRN

## 2023-11-18 MED ORDER — HYDROCODONE-ACETAMINOPHEN 5-325 MG PO TABS
1.0000 | ORAL_TABLET | Freq: Four times a day (QID) | ORAL | 0 refills | Status: DC | PRN
  Filled 2023-11-18: qty 20, 5d supply, fill #0

## 2023-11-18 MED ORDER — PROPOFOL 500 MG/50ML IV EMUL
INTRAVENOUS | Status: AC
  Filled 2023-11-18: qty 50

## 2023-11-18 MED ORDER — PHENYLEPHRINE 80 MCG/ML (10ML) SYRINGE FOR IV PUSH (FOR BLOOD PRESSURE SUPPORT)
PREFILLED_SYRINGE | INTRAVENOUS | Status: DC | PRN
  Administered 2023-11-18 (×2): 80 ug via INTRAVENOUS
  Administered 2023-11-18: 120 ug via INTRAVENOUS
  Administered 2023-11-18 (×4): 80 ug via INTRAVENOUS
  Administered 2023-11-18: 120 ug via INTRAVENOUS
  Administered 2023-11-18 (×2): 80 ug via INTRAVENOUS
  Administered 2023-11-18: 160 ug via INTRAVENOUS
  Administered 2023-11-18 (×3): 120 ug via INTRAVENOUS

## 2023-11-18 MED ORDER — HEPARIN 6000 UNIT IRRIGATION SOLUTION
Status: DC | PRN
  Administered 2023-11-18: 1

## 2023-11-18 MED ORDER — ONDANSETRON HCL 4 MG/2ML IJ SOLN
INTRAMUSCULAR | Status: AC
  Filled 2023-11-18: qty 2

## 2023-11-18 MED ORDER — SODIUM CHLORIDE 0.9% FLUSH: 10.0000 mL | Freq: Two times a day (BID) | INTRAVENOUS | Status: DC

## 2023-11-18 MED ORDER — HEMOSTATIC AGENTS (NO CHARGE) OPTIME
TOPICAL | Status: DC | PRN
  Administered 2023-11-18 (×3): 1 via TOPICAL

## 2023-11-18 MED ORDER — PHENYLEPHRINE 80 MCG/ML (10ML) SYRINGE FOR IV PUSH (FOR BLOOD PRESSURE SUPPORT)
PREFILLED_SYRINGE | INTRAVENOUS | Status: AC
Start: 2023-11-18 — End: ?
  Filled 2023-11-18: qty 10

## 2023-11-18 MED ORDER — PROPOFOL 10 MG/ML IV BOLUS
INTRAVENOUS | Status: DC | PRN
  Administered 2023-11-18: 100 mg via INTRAVENOUS

## 2023-11-18 MED ORDER — LIDOCAINE-EPINEPHRINE (PF) 1 %-1:200000 IJ SOLN
INTRAMUSCULAR | Status: AC
Start: 2023-11-18 — End: ?
  Filled 2023-11-18: qty 30

## 2023-11-18 MED ORDER — CHLORHEXIDINE GLUCONATE 0.12 % MT SOLN
OROMUCOSAL | Status: AC
  Administered 2023-11-18: 15 mL
  Filled 2023-11-18: qty 15

## 2023-11-18 MED ORDER — PROTAMINE SULFATE 10 MG/ML IV SOLN
INTRAVENOUS | Status: AC
Start: 2023-11-18 — End: ?
  Filled 2023-11-18: qty 5

## 2023-11-18 MED ORDER — ONDANSETRON HCL 4 MG/2ML IJ SOLN
INTRAMUSCULAR | Status: DC | PRN
  Administered 2023-11-18: 4 mg via INTRAVENOUS

## 2023-11-18 MED ORDER — PROPOFOL 10 MG/ML IV BOLUS
INTRAVENOUS | Status: AC
  Filled 2023-11-18: qty 20

## 2023-11-18 MED ORDER — FENTANYL CITRATE (PF) 250 MCG/5ML IJ SOLN
INTRAMUSCULAR | Status: DC | PRN
  Administered 2023-11-18: 25 ug via INTRAVENOUS

## 2023-11-18 MED ORDER — HEPARIN SODIUM (PORCINE) 1000 UNIT/ML IJ SOLN
INTRAMUSCULAR | Status: AC
Start: 2023-11-18 — End: ?
  Filled 2023-11-18: qty 1

## 2023-11-18 MED ORDER — CEFAZOLIN SODIUM-DEXTROSE 2-4 GM/100ML-% IV SOLN
2.0000 g | INTRAVENOUS | Status: AC
  Administered 2023-11-18: 2 g via INTRAVENOUS
  Filled 2023-11-18: qty 100

## 2023-11-18 MED ORDER — INSULIN ASPART 100 UNIT/ML IJ SOLN: 0.0000 [IU] | INTRAMUSCULAR | Status: DC | PRN

## 2023-11-18 MED ORDER — LIDOCAINE 2% (20 MG/ML) 5 ML SYRINGE
INTRAMUSCULAR | Status: AC
  Filled 2023-11-18: qty 5

## 2023-11-18 MED ORDER — HEPARIN SODIUM (PORCINE) 1000 UNIT/ML IJ SOLN
2000.0000 [IU] | Freq: Once | INTRAMUSCULAR | Status: AC
  Administered 2023-11-18: 2000 [IU]

## 2023-11-18 SURGICAL SUPPLY — 41 items
ARMBAND PINK RESTRICT EXTREMIT (MISCELLANEOUS) ×2 IMPLANT
BAG COUNTER SPONGE SURGICOUNT (BAG) ×2 IMPLANT
BLADE CLIPPER SURG (BLADE) ×2 IMPLANT
BNDG ELASTIC 4INX 5YD STR LF (GAUZE/BANDAGES/DRESSINGS) IMPLANT
BNDG ELASTIC 4X5.8 VLCR STR LF (GAUZE/BANDAGES/DRESSINGS) ×2 IMPLANT
BNDG ELASTIC 6INX 5YD STR LF (GAUZE/BANDAGES/DRESSINGS) IMPLANT
BNDG GAUZE DERMACEA FLUFF 4 (GAUZE/BANDAGES/DRESSINGS) IMPLANT
CANISTER SUCTION 3000ML PPV (SUCTIONS) ×2 IMPLANT
CLIP TI MEDIUM 24 (CLIP) ×2 IMPLANT
CLIP TI MEDIUM 6 (CLIP) ×6 IMPLANT
CLIP TI WIDE RED SMALL 24 (CLIP) ×2 IMPLANT
CLIP TI WIDE RED SMALL 6 (CLIP) ×4 IMPLANT
COVER PROBE W GEL 5X96 (DRAPES) ×2 IMPLANT
DERMABOND ADVANCED .7 DNX12 (GAUZE/BANDAGES/DRESSINGS) ×2 IMPLANT
DERMABOND ADVANCED .7 DNX6 (GAUZE/BANDAGES/DRESSINGS) IMPLANT
ELECTRODE REM PT RTRN 9FT ADLT (ELECTROSURGICAL) ×2 IMPLANT
GLOVE BIOGEL PI IND STRL 8 (GLOVE) ×2 IMPLANT
GOWN STRL REUS W/ TWL LRG LVL3 (GOWN DISPOSABLE) ×4 IMPLANT
GOWN STRL REUS W/TWL 2XL LVL3 (GOWN DISPOSABLE) ×4 IMPLANT
GRAFT GORETEX STRT 4-7X45 (Vascular Products) IMPLANT
HEMOSTAT SNOW SURGICEL 2X4 (HEMOSTASIS) IMPLANT
KIT BASIN OR (CUSTOM PROCEDURE TRAY) ×2 IMPLANT
KIT TURNOVER KIT B (KITS) ×2 IMPLANT
NS IRRIG 1000ML POUR BTL (IV SOLUTION) ×2 IMPLANT
PACK CV ACCESS (CUSTOM PROCEDURE TRAY) ×2 IMPLANT
PAD ARMBOARD POSITIONER FOAM (MISCELLANEOUS) ×4 IMPLANT
SLING ARM FOAM STRAP LRG (SOFTGOODS) IMPLANT
SPIKE FLUID TRANSFER (MISCELLANEOUS) ×2 IMPLANT
SPONGE T-LAP 18X18 ~~LOC~~+RFID (SPONGE) IMPLANT
STAPLER SKIN PROX 35W (STAPLE) IMPLANT
SURGIFLO W/THROMBIN 8M KIT (HEMOSTASIS) IMPLANT
SUT MNCRL AB 4-0 PS2 18 (SUTURE) ×2 IMPLANT
SUT PROLENE 6 0 BV (SUTURE) ×2 IMPLANT
SUT PROLENE 7 0 BV 1 (SUTURE) IMPLANT
SUT SILK 0 TIES 10X30 (SUTURE) IMPLANT
SUT SILK 2 0 PERMA HAND 18 BK (SUTURE) IMPLANT
SUT SILK 3 0 SH CR/8 (SUTURE) ×2 IMPLANT
SUT VIC AB 3-0 SH 27X BRD (SUTURE) ×4 IMPLANT
TOWEL GREEN STERILE (TOWEL DISPOSABLE) ×2 IMPLANT
UNDERPAD 30X36 HEAVY ABSORB (UNDERPADS AND DIAPERS) ×2 IMPLANT
WATER STERILE IRR 1000ML POUR (IV SOLUTION) ×2 IMPLANT

## 2023-11-18 NOTE — Discharge Instructions (Signed)

## 2023-11-18 NOTE — H&P (Signed)
 Office Note   Patient seen and examined in preop holding.  No complaints. No changes to medication history or physical exam since last seen in clinic. After discussing the risks and benefits of Left arm brachiobasilic fistula ligation, left arm AV graft placement, Paul Bond elected to proceed.   Kayla Part MD   CC:  follow up Requesting Provider:  No ref. provider found  HPI: Paul Bond is a 83 y.o. (12/05/1940) male who presents for reevaluation of left arm AV fistula.  He has numerous access procedures including left forearm loop graft, two-stage basilic vein transposition.  He has had numerous fistulograms on basilic vein however this has not been a durable access.  Dr. Christia Cowboy mention after last fistulogram he would recommend conversion to upper arm AV graft if cannulation problems continued.  He has been dialyzing via right IJ TDC over the past 2 months.  He dialyzes on a Tuesday Thursday Saturday schedule.   Past Medical History:  Diagnosis Date   Chronic indwelling Foley catheter    Chronic kidney disease    dialysis on tues thurs sat   Diabetes mellitus without complication (HCC)    type 2   Dysrhythmia    PAF with RVR episode 02/03/20 in setting of severe sepsis   Hypertension    Rectal cancer (HCC)    Stroke [facility])     Past Surgical History:  Procedure Laterality Date   A/V FISTULAGRAM Left 05/31/2023   Procedure: A/V Fistulagram;  Surgeon: Patrick Boor, MD;  Location: [facility] INVASIVE CV LAB;  Service: Cardiovascular;  Laterality: Left;   A/V FISTULAGRAM N/A 07/17/2023   Procedure: A/V Fistulagram;  Surgeon: Patrick Boor, MD;  Location: [facility] INVASIVE CV LAB;  Service: Cardiovascular;  Laterality: N/A;   AV FISTULA PLACEMENT Left 12/03/2022   Procedure: LEFT ARM ARTERIOVENOUS FISTULA CREATION;  Surgeon: Kayla Part, MD;  Location: [facility] OR;  Service: Vascular;  Laterality: Left;   BASCILIC VEIN TRANSPOSITION Left 01/28/2023   Procedure: LEFT ARM SECOND STAGE BASILIC  VEIN TRANSPOSITION;  Surgeon: Kayla Part, MD;  Location: [facility] OR;  Service: Vascular;  Laterality: Left;   BIOPSY  02/18/2020   Procedure: BIOPSY;  Surgeon: Ace Holder, MD;  Location: [facility] ENDOSCOPY;  Service: Gastroenterology;;   COLON SURGERY     COLONOSCOPY  02/18/2020   COLONOSCOPY WITH PROPOFOL  N/A 02/18/2020   Procedure: COLONOSCOPY WITH PROPOFOL ;  Surgeon: Ace Holder, MD;  Location: [facility] ENDOSCOPY;  Service: Gastroenterology;  Laterality: N/A;   CYSTOSCOPY W/ URETERAL STENT PLACEMENT Right 02/03/2020   Procedure: CYSTOSCOPY WITH RETROGRADE PYELOGRAM/URETERAL DOUBLE J STENT PLACEMENT;  Surgeon: Trent Frizzle, MD;  Location: [facility] OR;  Service: Urology;  Laterality: Right;   DIALYSIS/PERMA CATHETER INSERTION N/A 07/17/2023   Procedure: DIALYSIS/PERMA CATHETER INSERTION;  Surgeon: Patrick Boor, MD;  Location: [facility] INVASIVE CV LAB;  Service: Cardiovascular;  Laterality: N/A;   DIALYSIS/PERMA CATHETER REMOVAL N/A 05/22/2023   Procedure: DIALYSIS/PERMA CATHETER REMOVAL;  Surgeon: Patrick Boor, MD;  Location: [facility] INVASIVE CV LAB;  Service: Cardiovascular;  Laterality: N/A;   ESOPHAGOGASTRODUODENOSCOPY (EGD) WITH PROPOFOL  N/A 02/18/2020   Procedure: ESOPHAGOGASTRODUODENOSCOPY (EGD) WITH PROPOFOL ;  Surgeon: Ace Holder, MD;  Location: [facility] ENDOSCOPY;  Service: Gastroenterology;  Laterality: N/A;   FISTULOGRAM  07/29/2023   Procedure: FISTULOGRAM;  Surgeon: Kayla Part, MD;  Location: [facility] OR;  Service: Vascular;;   IR FLUORO GUIDE CV LINE RIGHT  02/14/2022   IR US  GUIDE VASC ACCESS RIGHT  02/14/2022  PERIPHERAL VASCULAR BALLOON ANGIOPLASTY  05/31/2023   Procedure: PERIPHERAL VASCULAR BALLOON ANGIOPLASTY;  Surgeon: Patrick Boor, MD;  Location: [facility] INVASIVE CV LAB;  Service: Cardiovascular;;  lt arm fistula   PERIPHERAL VASCULAR BALLOON ANGIOPLASTY  07/17/2023   Procedure: PERIPHERAL VASCULAR BALLOON ANGIOPLASTY;  Surgeon: Patrick Boor, MD;  Location: [facility] INVASIVE CV LAB;  Service:  Cardiovascular;;  Arterial Anastomosis; Outflow Basilic Vein   PERIPHERAL VASCULAR BALLOON ANGIOPLASTY  07/17/2023   Procedure: PERIPHERAL VASCULAR BALLOON ANGIOPLASTY;  Surgeon: Patrick Boor, MD;  Location: [facility] INVASIVE CV LAB;  Service: Cardiovascular;;  right innominate   POLYPECTOMY  02/18/2020   Procedure: POLYPECTOMY;  Surgeon: Ace Holder, MD;  Location: [facility] ENDOSCOPY;  Service: Gastroenterology;;    Social History   Socioeconomic History   Marital status: Married    Spouse name: Not on file   Number of children: Not on file   Years of education: Not on file   Highest education level: Not on file  Occupational History   Occupation: retired  Tobacco Use   Smoking status: Former    Current packs/day: 0.00    Average packs/day: 1 pack/day for 15.0 years (15.0 ttl pk-yrs)    Types: Cigarettes    Start date: 60    Quit date: 1974    Years since quitting: 51.4   Smokeless tobacco: Never  Vaping Use   Vaping status: Never Used  Substance and Sexual Activity   Alcohol  use: Not Currently    Comment: quit in 1974, binge drinking prior   Drug use: Never   Sexual activity: Not Currently  Other Topics Concern   Not on file  Social History Narrative   Not on file   Social Drivers of Health   Financial Resource Strain: Not on file  Food Insecurity: No Food Insecurity (02/14/2022)   Hunger Vital Sign    Worried About Running Out of Food in the Last Year: Never true    Ran Out of Food in the Last Year: Never true  Transportation Needs: No Transportation Needs (02/14/2022)   PRAPARE - Administrator, Civil Service (Medical): No    Lack of Transportation (Non-Medical): No  Physical Activity: Not on file  Stress: Not on file  Social Connections: Not on file  Intimate Partner Violence: Not At Risk (02/14/2022)   Humiliation, Afraid, Rape, and Kick questionnaire    Fear of Current or Ex-Partner: No    Emotionally Abused: No    Physically Abused: No    Sexually  Abused: No   History reviewed. No pertinent family history.  Current Facility-Administered Medications  Medication Dose Route Frequency Provider Last Rate Last Admin   ceFAZolin  (ANCEF ) IVPB 2g/100 mL premix  2 g Intravenous 30 min Pre-Op  Donyetta Ogletree E, MD       chlorhexidine  (HIBICLENS ) 4 % liquid 4 Application  60 mL Topical Once Mariadelaluz Guggenheim E, MD       And   [START ON 11/19/2023] chlorhexidine  (HIBICLENS ) 4 % liquid 4 Application  60 mL Topical Once Lailynn Southgate E, MD       insulin  aspart (novoLOG ) injection 0-7 Units  0-7 Units Subcutaneous Q2H PRN Arvie Latus, MD       sodium chloride  flush (NS) 0.9 % injection 3-10 mL  3-10 mL Intravenous PRN Sadey Yandell E, MD        Allergies  Allergen Reactions   Lisinopril Cough   Semaglutide Other (See Comments)    Indigestion  *Ozempic  REVIEW OF SYSTEMS:   [X]  denotes positive finding, [ ]  denotes negative finding Cardiac  Comments:  Chest pain or chest pressure:    Shortness of breath upon exertion:    Short of breath when lying flat:    Irregular heart rhythm:        Vascular    Pain in calf, thigh, or hip brought on by ambulation:    Pain in feet at night that wakes you up from your sleep:     Blood clot in your veins:    Leg swelling:         Pulmonary    Oxygen at home:    Productive cough:     Wheezing:         Neurologic    Sudden weakness in arms or legs:     Sudden numbness in arms or legs:     Sudden onset of difficulty speaking or slurred speech:    Temporary loss of vision in one eye:     Problems with dizziness:         Gastrointestinal    Blood in stool:     Vomited blood:         Genitourinary    Burning when urinating:     Blood in urine:        Psychiatric    Major depression:         Hematologic    Bleeding problems:    Problems with blood clotting too easily:        Skin    Rashes or ulcers:        Constitutional    Fever or chills:      PHYSICAL  EXAMINATION:  Vitals:   11/15/23 0927 11/18/23 0758  BP:  105/60  Pulse:  83  Resp:  18  Temp:  98.6 F (37 C)  TempSrc:  Oral  SpO2:  98%  Weight: 65.8 kg 65.7 kg  Height: 5\' 7"  (1.702 m) 5\' 7"  (1.702 m)    General:  WDWN in NAD; vital signs documented above Gait: Not observed HENT: WNL, normocephalic Pulmonary: normal non-labored breathing Cardiac: regular HR Abdomen: soft, NT, no masses Skin: without rashes Vascular Exam/Pulses: palpable L radial 1+ Extremities: no thrill in L arm AVF Musculoskeletal: no muscle wasting or atrophy  Neurologic: A&O X 3 Psychiatric:  The pt has Normal affect.   Non-Invasive Vascular Imaging:   Low flow volume through AV fistula with small diameter and thrombus noted near the arterial anastomosis    ASSESSMENT/PLAN:: 83 y.o. male here for follow up for evaluation of ongoing difficulty with left arm AV fistula  Mr. Schoon is an 83 year old male who underwent two-stage basilic vein transposition last year.  He has had numerous fistulogram's since however the fistula has only been able to be used for a short period of time.  He has been dialyzing exclusively from right IJ [facility] over the past several months.  Duplex demonstrates thrombus near the arterial anastomosis with low flow volume throughout the fistula.  I discussed 2 options moving forward with Mr. Colson and his daughter.  Option 1 would be to proceed with left upper arm AV graft placement.  Option 2 would be to continue HD via [facility] indefinitely.  He would like to try 1 more time to have a functional access prior to becoming catheter dependent.  He will be scheduled for left arm AV graft with Dr. Rosalva Comber on a nondialysis day in the near future.  Case  was discussed in detail with the patient and his daughter and they are agreeable to proceed.  I recorded his phone number today and he will be contacted in the next few days by our office to schedule his surgery.   Kayla Part, PA-C [facility] [phone]  Clinic MD:   Charlotte Cookey

## 2023-11-18 NOTE — Transfer of Care (Signed)
 Immediate Anesthesia Transfer of Care Note  Patient: Paul Bond  Procedure(s) Performed: INSERTION, GRAFT, ARTERIOVENOUS, UPPER EXTREMITY (Left) LIGATION OF previous ARTERIOVENOUS  FISTULA and redo exposure of brachial artery  Patient Location: PACU  Anesthesia Type:General  Level of Consciousness: awake and alert   Airway & Oxygen Therapy: Patient Spontanous Breathing and Patient connected to nasal cannula oxygen  Post-op Assessment: Report given to RN and Post -op Vital signs reviewed and stable  Post vital signs: Reviewed and stable  Last Vitals:  Vitals Value Taken Time  BP    Temp    Pulse 88 11/18/23 1359  Resp 21 11/18/23 1359  SpO2 100 % 11/18/23 1359  Vitals shown include unfiled device data.  Last Pain:  Vitals:   11/18/23 0855  TempSrc:   PainSc: 0-No pain         Complications: No notable events documented.

## 2023-11-18 NOTE — Progress Notes (Signed)
 Office Note   Patient seen and examined in preop holding.  No complaints. No changes to medication history or physical exam since last seen in clinic. After discussing the risks and benefits of Left arm brachiobasilic fistula ligation, left arm AV graft placement, Arvo R Deer elected to proceed.   Kayla Part MD   CC:  follow up Requesting Provider:  No ref. provider found  HPI: Paul Bond is a 83 y.o. (12/05/1940) male who presents for reevaluation of left arm AV fistula.  He has numerous access procedures including left forearm loop graft, two-stage basilic vein transposition.  He has had numerous fistulograms on basilic vein however this has not been a durable access.  Dr. Christia Cowboy mention after last fistulogram he would recommend conversion to upper arm AV graft if cannulation problems continued.  He has been dialyzing via right IJ TDC over the past 2 months.  He dialyzes on a Tuesday Thursday Saturday schedule.   Past Medical History:  Diagnosis Date   Chronic indwelling Foley catheter    Chronic kidney disease    dialysis on tues thurs sat   Diabetes mellitus without complication (HCC)    type 2   Dysrhythmia    PAF with RVR episode 02/03/20 in setting of severe sepsis   Hypertension    Rectal cancer (HCC)    Stroke [facility])     Past Surgical History:  Procedure Laterality Date   A/V FISTULAGRAM Left 05/31/2023   Procedure: A/V Fistulagram;  Surgeon: Paul Boor, MD;  Location: [facility] INVASIVE CV LAB;  Service: Cardiovascular;  Laterality: Left;   A/V FISTULAGRAM N/A 07/17/2023   Procedure: A/V Fistulagram;  Surgeon: Paul Boor, MD;  Location: [facility] INVASIVE CV LAB;  Service: Cardiovascular;  Laterality: N/A;   AV FISTULA PLACEMENT Left 12/03/2022   Procedure: LEFT ARM ARTERIOVENOUS FISTULA CREATION;  Surgeon: Kayla Part, MD;  Location: [facility] OR;  Service: Vascular;  Laterality: Left;   BASCILIC VEIN TRANSPOSITION Left 01/28/2023   Procedure: LEFT ARM SECOND STAGE BASILIC  VEIN TRANSPOSITION;  Surgeon: Kayla Part, MD;  Location: [facility] OR;  Service: Vascular;  Laterality: Left;   BIOPSY  02/18/2020   Procedure: BIOPSY;  Surgeon: Paul Holder, MD;  Location: [facility] ENDOSCOPY;  Service: Gastroenterology;;   COLON SURGERY     COLONOSCOPY  02/18/2020   COLONOSCOPY WITH PROPOFOL  N/A 02/18/2020   Procedure: COLONOSCOPY WITH PROPOFOL ;  Surgeon: Paul Holder, MD;  Location: [facility] ENDOSCOPY;  Service: Gastroenterology;  Laterality: N/A;   CYSTOSCOPY W/ URETERAL STENT PLACEMENT Right 02/03/2020   Procedure: CYSTOSCOPY WITH RETROGRADE PYELOGRAM/URETERAL DOUBLE J STENT PLACEMENT;  Surgeon: Paul Frizzle, MD;  Location: [facility] OR;  Service: Urology;  Laterality: Right;   DIALYSIS/PERMA CATHETER INSERTION N/A 07/17/2023   Procedure: DIALYSIS/PERMA CATHETER INSERTION;  Surgeon: Paul Boor, MD;  Location: [facility] INVASIVE CV LAB;  Service: Cardiovascular;  Laterality: N/A;   DIALYSIS/PERMA CATHETER REMOVAL N/A 05/22/2023   Procedure: DIALYSIS/PERMA CATHETER REMOVAL;  Surgeon: Paul Boor, MD;  Location: [facility] INVASIVE CV LAB;  Service: Cardiovascular;  Laterality: N/A;   ESOPHAGOGASTRODUODENOSCOPY (EGD) WITH PROPOFOL  N/A 02/18/2020   Procedure: ESOPHAGOGASTRODUODENOSCOPY (EGD) WITH PROPOFOL ;  Surgeon: Paul Holder, MD;  Location: [facility] ENDOSCOPY;  Service: Gastroenterology;  Laterality: N/A;   FISTULOGRAM  07/29/2023   Procedure: FISTULOGRAM;  Surgeon: Kayla Part, MD;  Location: [facility] OR;  Service: Vascular;;   IR FLUORO GUIDE CV LINE RIGHT  02/14/2022   IR US  GUIDE VASC ACCESS RIGHT  02/14/2022  PERIPHERAL VASCULAR BALLOON ANGIOPLASTY  05/31/2023   Procedure: PERIPHERAL VASCULAR BALLOON ANGIOPLASTY;  Surgeon: Paul Boor, MD;  Location: [facility] INVASIVE CV LAB;  Service: Cardiovascular;;  lt arm fistula   PERIPHERAL VASCULAR BALLOON ANGIOPLASTY  07/17/2023   Procedure: PERIPHERAL VASCULAR BALLOON ANGIOPLASTY;  Surgeon: Paul Boor, MD;  Location: [facility] INVASIVE CV LAB;  Service:  Cardiovascular;;  Arterial Anastomosis; Outflow Basilic Vein   PERIPHERAL VASCULAR BALLOON ANGIOPLASTY  07/17/2023   Procedure: PERIPHERAL VASCULAR BALLOON ANGIOPLASTY;  Surgeon: Paul Boor, MD;  Location: [facility] INVASIVE CV LAB;  Service: Cardiovascular;;  right innominate   POLYPECTOMY  02/18/2020   Procedure: POLYPECTOMY;  Surgeon: Paul Holder, MD;  Location: [facility] ENDOSCOPY;  Service: Gastroenterology;;    Social History   Socioeconomic History   Marital status: Married    Spouse name: Not on file   Number of children: Not on file   Years of education: Not on file   Highest education level: Not on file  Occupational History   Occupation: retired  Tobacco Use   Smoking status: Former    Current packs/day: 0.00    Average packs/day: 1 pack/day for 15.0 years (15.0 ttl pk-yrs)    Types: Cigarettes    Start date: 60    Quit date: 1974    Years since quitting: 51.4   Smokeless tobacco: Never  Vaping Use   Vaping status: Never Used  Substance and Sexual Activity   Alcohol  use: Not Currently    Comment: quit in 1974, binge drinking prior   Drug use: Never   Sexual activity: Not Currently  Other Topics Concern   Not on file  Social History Narrative   Not on file   Social Drivers of Health   Financial Resource Strain: Not on file  Food Insecurity: No Food Insecurity (02/14/2022)   Hunger Vital Sign    Worried About Running Out of Food in the Last Year: Never true    Ran Out of Food in the Last Year: Never true  Transportation Needs: No Transportation Needs (02/14/2022)   PRAPARE - Administrator, Civil Service (Medical): No    Lack of Transportation (Non-Medical): No  Physical Activity: Not on file  Stress: Not on file  Social Connections: Not on file  Intimate Partner Violence: Not At Risk (02/14/2022)   Humiliation, Afraid, Rape, and Kick questionnaire    Fear of Current or Ex-Partner: No    Emotionally Abused: No    Physically Abused: No    Sexually  Abused: No   History reviewed. No pertinent family history.  Current Facility-Administered Medications  Medication Dose Route Frequency Provider Last Rate Last Admin   ceFAZolin  (ANCEF ) IVPB 2g/100 mL premix  2 g Intravenous 30 min Pre-Op  Paul Ogletree E, MD       chlorhexidine  (HIBICLENS ) 4 % liquid 4 Application  60 mL Topical Once Paul Guggenheim E, MD       And   [START ON 11/19/2023] chlorhexidine  (HIBICLENS ) 4 % liquid 4 Application  60 mL Topical Once Paul Southgate E, MD       insulin  aspart (novoLOG ) injection 0-7 Units  0-7 Units Subcutaneous Q2H PRN Paul Latus, MD       sodium chloride  flush (NS) 0.9 % injection 3-10 mL  3-10 mL Intravenous PRN Paul Yandell E, MD        Allergies  Allergen Reactions   Lisinopril Cough   Semaglutide Other (See Comments)    Indigestion  *Ozempic  REVIEW OF SYSTEMS:   [X]  denotes positive finding, [ ]  denotes negative finding Cardiac  Comments:  Chest pain or chest pressure:    Shortness of breath upon exertion:    Short of breath when lying flat:    Irregular heart rhythm:        Vascular    Pain in calf, thigh, or hip brought on by ambulation:    Pain in feet at night that wakes you up from your sleep:     Blood clot in your veins:    Leg swelling:         Pulmonary    Oxygen at home:    Productive cough:     Wheezing:         Neurologic    Sudden weakness in arms or legs:     Sudden numbness in arms or legs:     Sudden onset of difficulty speaking or slurred speech:    Temporary loss of vision in one eye:     Problems with dizziness:         Gastrointestinal    Blood in stool:     Vomited blood:         Genitourinary    Burning when urinating:     Blood in urine:        Psychiatric    Major depression:         Hematologic    Bleeding problems:    Problems with blood clotting too easily:        Skin    Rashes or ulcers:        Constitutional    Fever or chills:      PHYSICAL  EXAMINATION:  Vitals:   11/15/23 0927 11/18/23 0758  BP:  105/60  Pulse:  83  Resp:  18  Temp:  98.6 F (37 C)  TempSrc:  Oral  SpO2:  98%  Weight: 65.8 kg 65.7 kg  Height: 5\' 7"  (1.702 m) 5\' 7"  (1.702 m)    General:  WDWN in NAD; vital signs documented above Gait: Not observed HENT: WNL, normocephalic Pulmonary: normal non-labored breathing Cardiac: regular HR Abdomen: soft, NT, no masses Skin: without rashes Vascular Exam/Pulses: palpable L radial 1+ Extremities: no thrill in L arm AVF Musculoskeletal: no muscle wasting or atrophy  Neurologic: A&O X 3 Psychiatric:  The pt has Normal affect.   Non-Invasive Vascular Imaging:   Low flow volume through AV fistula with small diameter and thrombus noted near the arterial anastomosis    ASSESSMENT/PLAN:: 83 y.o. male here for follow up for evaluation of ongoing difficulty with left arm AV fistula  Mr. Schoon is an 83 year old male who underwent two-stage basilic vein transposition last year.  He has had numerous fistulogram's since however the fistula has only been able to be used for a short period of time.  He has been dialyzing exclusively from right IJ [facility] over the past several months.  Duplex demonstrates thrombus near the arterial anastomosis with low flow volume throughout the fistula.  I discussed 2 options moving forward with Mr. Colson and his daughter.  Option 1 would be to proceed with left upper arm AV graft placement.  Option 2 would be to continue HD via [facility] indefinitely.  He would like to try 1 more time to have a functional access prior to becoming catheter dependent.  He will be scheduled for left arm AV graft with Dr. Rosalva Comber on a nondialysis day in the near future.  Case  was discussed in detail with the patient and his daughter and they are agreeable to proceed.  I recorded his phone number today and he will be contacted in the next few days by our office to schedule his surgery.   Kayla Part, PA-C [facility] [phone]  Clinic MD:   Paul Bond

## 2023-11-18 NOTE — Op Note (Signed)
 NAME: RIDGE LAFOND    MRN: [id number] DOB: 1940/07/22    DATE OF OPERATION: 11/18/2023  PREOP DIAGNOSIS:    End stage renal disease requiring dialysis   POSTOP DIAGNOSIS:    Same  PROCEDURE:    Left arm brachiobasilic fistula ligation  Reexposure of the left brachial artery greater than 30 days.  Left arm brachial artery to axillary vein AV graft using 4-75mm PTFE  SURGEON: Kayla Part  ASSIST: Cordie Deters, PA  ANESTHESIA: General   EBL: 50ml  INDICATIONS:    JUSTAN GAEDE is a 83 y.o. male with history of end-stage renal disease and prior history of left arm loop graft, left arm brachiobasilic fistula.  The fistula failed to mature.  He presents today for new access after undergoing multiple fistulogram's for balloon assisted maturation.  FINDINGS:   Redo exposure of the left brachial artery.  Artery fragile, 4 mm Axillary vein 7 mm   TECHNIQUE:   After informed consent was obtained, the patient was brought to the operating room, placed supine on the operating room table.  General anesthesia was used.  The patient was prepped and draped in normal sterile fashion.  Surgical time-out was taken. Pre-op  antibiotics were given.  Procedure began with using ultrasound to insonate the brachial artery and axillary vein in the left arm.  Both proved of sufficient size to continue with AV graft.  I then moved to make a longitudinal incision over the previous brachiobasilic fistula.  The fistula was isolated, and ligated using 0 silk ties and clips.  A longitudinal incision was then made at the distal aspect of the humerus. The brachial artery was identified, gently dissected, and encircled with silastic loops.  This proved difficult due to the redo nature of the exposure.  Special attention was taken to ensure that the median nerve was not injured.  A second incision was then made over the axillary vein and similarly dissected and encircled.  This also proved difficult due  to prior brachiobasilic fistula transposition.  There were 2 veins appreciated, electricity is a larger, which measured roughly 7 mm.  The tunneling device was then passed between these incisions. A 4 X 7 taper PTFE graft was passed through the tunnel, taking care to avoid kinking or twists. The graft was irrigated with heparinized saline solution.   Proximal and distal arterial control was then obtained and a longitudinal arteriotomy was made on the brachial artery. The artery was irrigated with heparinized saline solution. An end to side artery anastomosis was then performed from the 4mm graft to the brachial vein in a running fashion using running 6-0 prolene suture. When the clamps were removed from the artery, the graft demonstrated excellent inflow. It was flushed with heparinized saline prior to being clamped. There was an excellent radial signal at the wrist.  Attention was then directed to the axillary vein anastomosis. Proximal and distal control were obtained and a longitudinal venotomy was made. The vein was then irrigated with heparinized saline solution. The end of the PTFE graft was then cut in a beveled fashion at the appropriate length. An end to side vein anastomosis was then performed, using a running, 6-0 prolene suture. Prior to establishing flow, all vessels were back bled and flushed to ensure there was no clot.    There was a strong distal signal in the radial artery and a thrill in the vein centrally. Hemostasis was found to be satisfactory, and all wounds were irrigated with saline  solution and closed in layers with vicryl and Monocryl at the skin in the axilla, staples at the antecubital fossa.  A dry sterile dressing was applied. Anesthetic care was terminated. The patient was transported to the recovery room in stable condition.  In PACU, he was able to move the extremity without issue.  Denied numbness tingling.  Excellent thrill within the graft.  Does have problems with  hypotension at dialysis.  Should the graft fail in the short term, both pt and family aware we will not move to thrombectomy/ revision.   Kayla Part, MD [facility] DATE OF DICTATION:   11/18/2023

## 2023-11-19 ENCOUNTER — Encounter (HOSPITAL_COMMUNITY): Payer: Self-pay | Admitting: Vascular Surgery

## 2023-11-19 NOTE — Anesthesia Postprocedure Evaluation (Signed)
 Anesthesia Post Note  Patient: Paul Bond  Procedure(s) Performed: INSERTION, GRAFT, ARTERIOVENOUS, UPPER EXTREMITY (Left) LIGATION OF previous ARTERIOVENOUS  FISTULA and redo exposure of brachial artery     Patient location during evaluation: PACU Anesthesia Type: General Level of consciousness: patient cooperative Pain management: pain level controlled Vital Signs Assessment: post-procedure vital signs reviewed and stable Respiratory status: spontaneous breathing, nonlabored ventilation and respiratory function stable Cardiovascular status: blood pressure returned to baseline and stable Postop Assessment: no apparent nausea or vomiting Anesthetic complications: no   No notable events documented.                  Gwenivere Hiraldo
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
## 2023-11-27 ENCOUNTER — Ambulatory Visit: Admitting: Physician Assistant

## 2023-12-05 ENCOUNTER — Telehealth: Payer: Self-pay

## 2023-12-05 NOTE — Telephone Encounter (Signed)
 Received call from [place] at [facility] asking about staple removal and accessing left AVG. Advised that [facility] will remove staples at the 12/26/23 appt and the AVG should not be accessed until after his appt.  Also spoke with Ginger Daring about possibly moving pt's 12/26/23 appt to 12/23/23. Tabitha asked that we keep the 12/26/23 appt because she is his source of transportation and will be out of town.

## 2023-12-26 ENCOUNTER — Ambulatory Visit: Attending: Vascular Surgery | Admitting: Physician Assistant

## 2023-12-26 ENCOUNTER — Encounter: Payer: Self-pay | Admitting: Physician Assistant

## 2023-12-26 VITALS — BP 84/52 | HR 91 | Temp 98.4°F | Wt 144.0 lb

## 2023-12-26 DIAGNOSIS — N186 End stage renal disease: Secondary | ICD-10-CM

## 2023-12-26 NOTE — Progress Notes (Signed)
    Postoperative Access Visit   History of Present Illness   Paul Bond is a 83 y.o. year old male who presents for postoperative follow-up for: Left arm brachiobasilic fistula ligation, reexposure of left brachial artery and left arm brachial to axillary vein AV graft by Dr. Lanis on 11/18/23. He had prior history of left arm loop graft and left brachiobasilic AV fistula that failed to mature despite multiple attempts at balloon assisted maturation. The patient's wounds are well healed.  The patient notes no steal symptoms.  He currently dialyzes via right internal jugular TDC  on TTS  Physical Examination   Vitals:   12/26/23 1412  BP: (!) 84/52  Pulse: 91  Temp: 98.4 F (36.9 C)  TempSrc: Temporal  Weight: 144 lb (65.3 kg)   Body mass index is 22.55 kg/m.  left arm Incision is well healed. Staples removed today. 2+ radial pulse, hand grip is 5/5, sensation in digits is intact, palpable thrill, bruit can be auscultated     Medical Decision Making   Paul Bond is a 83 y.o. year old male who presents s/p Left arm brachiobasilic fistula ligation, reexposure of left brachial artery and left arm brachial to axillary vein AV graft by Dr. Lanis on 11/18/23. Graft with good thrill, audible bruit. Incision healed. Staples removed today. Patent is without signs or symptoms of steal syndrome The patient's access will be ready for use immediately The patient's tunneled dialysis catheter can be removed when Nephrology is comfortable with the performance of the left AV graft The patient may follow up on a prn basis   Teretha Damme, PA-C [facility] [place] Office: [phone]  Clinic MD: Lanis

## 2024-01-20 ENCOUNTER — Other Ambulatory Visit (HOSPITAL_COMMUNITY): Payer: Self-pay | Admitting: Nephrology

## 2024-01-20 DIAGNOSIS — N186 End stage renal disease: Secondary | ICD-10-CM

## 2024-01-23 ENCOUNTER — Other Ambulatory Visit: Payer: Self-pay

## 2024-01-23 ENCOUNTER — Encounter (HOSPITAL_COMMUNITY)
Admission: RE | Disposition: A | Discharge status: Home / Self Care | Payer: Self-pay | Source: Home / Self Care | Attending: Vascular Surgery

## 2024-01-23 ENCOUNTER — Ambulatory Visit (HOSPITAL_COMMUNITY)
Admission: RE | Admit: 2024-01-23 | Discharge: 2024-01-23 | Disposition: A | Discharge status: Home / Self Care | Attending: Vascular Surgery | Admitting: Vascular Surgery

## 2024-01-23 DIAGNOSIS — I120 Hypertensive chronic kidney disease with stage 5 chronic kidney disease or end stage renal disease: Secondary | ICD-10-CM | POA: Insufficient documentation

## 2024-01-23 DIAGNOSIS — Z992 Dependence on renal dialysis: Secondary | ICD-10-CM | POA: Insufficient documentation

## 2024-01-23 DIAGNOSIS — Y832 Surgical operation with anastomosis, bypass or graft as the cause of abnormal reaction of the patient, or of later complication, without mention of misadventure at the time of the procedure: Secondary | ICD-10-CM | POA: Diagnosis not present

## 2024-01-23 DIAGNOSIS — T82868A Thrombosis of vascular prosthetic devices, implants and grafts, initial encounter: Secondary | ICD-10-CM | POA: Insufficient documentation

## 2024-01-23 DIAGNOSIS — E1122 Type 2 diabetes mellitus with diabetic chronic kidney disease: Secondary | ICD-10-CM | POA: Insufficient documentation

## 2024-01-23 DIAGNOSIS — Z87891 Personal history of nicotine dependence: Secondary | ICD-10-CM | POA: Diagnosis not present

## 2024-01-23 DIAGNOSIS — T82858A Stenosis of vascular prosthetic devices, implants and grafts, initial encounter: Secondary | ICD-10-CM | POA: Diagnosis not present

## 2024-01-23 DIAGNOSIS — N186 End stage renal disease: Secondary | ICD-10-CM | POA: Diagnosis not present

## 2024-01-23 DIAGNOSIS — Z794 Long term (current) use of insulin: Secondary | ICD-10-CM | POA: Insufficient documentation

## 2024-01-23 HISTORY — PX: PERIPHERAL VASCULAR THROMBECTOMY: CATH118306

## 2024-01-23 LAB — GLUCOSE, CAPILLARY: Glucose-Capillary: 127 mg/dL — ABNORMAL HIGH (ref 70–99)

## 2024-01-23 SURGERY — PERIPHERAL VASCULAR THROMBECTOMY
Anesthesia: LOCAL | Site: Arm Upper | Laterality: Left

## 2024-01-23 MED ORDER — HEPARIN SODIUM (PORCINE) 1000 UNIT/ML IJ SOLN
INTRAMUSCULAR | Status: DC | PRN
Start: 2024-01-23 — End: 2024-01-23
  Administered 2024-01-23: 8000 [IU] via INTRAVENOUS

## 2024-01-23 MED ORDER — IODIXANOL 320 MG/ML IV SOLN
INTRAVENOUS | Status: DC | PRN
  Administered 2024-01-23: 25 mL

## 2024-01-23 MED ORDER — MIDAZOLAM HCL 2 MG/2ML IJ SOLN
INTRAMUSCULAR | Status: DC | PRN
  Administered 2024-01-23: 1 mg via INTRAVENOUS

## 2024-01-23 MED ORDER — FENTANYL CITRATE (PF) 100 MCG/2ML IJ SOLN
INTRAMUSCULAR | Status: AC
Start: 2024-01-23 — End: 2024-01-23
  Filled 2024-01-23: qty 2

## 2024-01-23 MED ORDER — MIDAZOLAM HCL 2 MG/2ML IJ SOLN
INTRAMUSCULAR | Status: AC
  Filled 2024-01-23: qty 2

## 2024-01-23 MED ORDER — LIDOCAINE HCL (PF) 1 % IJ SOLN
INTRAMUSCULAR | Status: DC | PRN
  Administered 2024-01-23: 2 mL via INTRADERMAL

## 2024-01-23 MED ORDER — FENTANYL CITRATE (PF) 100 MCG/2ML IJ SOLN
INTRAMUSCULAR | Status: DC | PRN
  Administered 2024-01-23: 50 ug via INTRAVENOUS

## 2024-01-23 MED ORDER — LIDOCAINE HCL (PF) 1 % IJ SOLN
INTRAMUSCULAR | Status: AC
  Filled 2024-01-23: qty 30

## 2024-01-23 MED ORDER — DEXTROSE 50 % IV SOLN
INTRAVENOUS | Status: AC
  Filled 2024-01-23: qty 50

## 2024-01-23 MED ORDER — HEPARIN SODIUM (PORCINE) 1000 UNIT/ML IJ SOLN
INTRAMUSCULAR | Status: AC
  Filled 2024-01-23: qty 10

## 2024-01-23 SURGICAL SUPPLY — 10 items
BALLOON FOGARTY 5FR 40 (CATHETERS) IMPLANT
BALLOON MUSTANG 7X80X75 (BALLOONS) IMPLANT
CATH STR 7FR 55 BRITE (CATHETERS) IMPLANT
KIT ENCORE 26 ADVANTAGE (KITS) IMPLANT
KIT MICROPUNCTURE NIT STIFF (SHEATH) IMPLANT
SHEATH PINNACLE R/O II 6F 4CM (SHEATH) IMPLANT
SHEATH PINNACLE R/O II 7F 4CM (SHEATH) IMPLANT
SHEATH PROBE COVER 6X72 (BAG) IMPLANT
TRAY PV CATH (CUSTOM PROCEDURE TRAY) ×1 IMPLANT
WIRE BENTSON .035X145CM (WIRE) IMPLANT

## 2024-01-23 NOTE — H&P (Signed)
 H+P  History of Present Illness: This is a 83 y.o. 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  He previously had failed basilic vein fistula on the left that failed to mature.  Graft work for 2 weeks but is now noted to be occluded since Tuesday of this week.  He does have a catheter in place that he is using for dialysis.  Past Medical History:  Diagnosis Date   Chronic indwelling Foley catheter    Chronic kidney disease    dialysis on tues thurs sat   Diabetes mellitus without complication (HCC)    type 2   Dysrhythmia    PAF with RVR episode 02/03/20 in setting of severe sepsis   Hypertension    Rectal cancer (HCC)    Stroke [facility])     Past Surgical History:  Procedure Laterality Date   A/V FISTULAGRAM Left 05/31/2023   Procedure: A/V Fistulagram;  Surgeon: Melia Lynwood ORN, MD;  Location: [facility] INVASIVE CV LAB;  Service: Cardiovascular;  Laterality: Left;   A/V FISTULAGRAM N/A 07/17/2023   Procedure: A/V Fistulagram;  Surgeon: Melia Lynwood ORN, MD;  Location: [facility] INVASIVE CV LAB;  Service: Cardiovascular;  Laterality: N/A;   AV FISTULA PLACEMENT Left 12/03/2022   Procedure: LEFT ARM ARTERIOVENOUS FISTULA CREATION;  Surgeon: Lanis Fonda BRAVO, MD;  Location: [facility] OR;  Service: Vascular;  Laterality: Left;   BASCILIC VEIN TRANSPOSITION Left 01/28/2023   Procedure: LEFT ARM SECOND STAGE BASILIC VEIN TRANSPOSITION;  Surgeon: Lanis Fonda BRAVO, MD;  Location: [facility] OR;  Service: Vascular;  Laterality: Left;   BIOPSY  02/18/2020   Procedure: BIOPSY;  Surgeon: Leigh Elspeth SQUIBB, MD;  Location: [facility] ENDOSCOPY;  Service: Gastroenterology;;   COLON SURGERY     COLONOSCOPY  02/18/2020   COLONOSCOPY WITH PROPOFOL  N/A 02/18/2020   Procedure: COLONOSCOPY WITH PROPOFOL ;  Surgeon: Leigh Elspeth SQUIBB, MD;  Location: [facility] ENDOSCOPY;  Service: Gastroenterology;  Laterality: N/A;   CYSTOSCOPY W/ URETERAL STENT PLACEMENT  Right 02/03/2020   Procedure: CYSTOSCOPY WITH RETROGRADE PYELOGRAM/URETERAL DOUBLE J STENT PLACEMENT;  Surgeon: Matilda Senior, MD;  Location: [facility] OR;  Service: Urology;  Laterality: Right;   DIALYSIS/PERMA CATHETER INSERTION N/A 07/17/2023   Procedure: DIALYSIS/PERMA CATHETER INSERTION;  Surgeon: Melia Lynwood ORN, MD;  Location: [facility] INVASIVE CV LAB;  Service: Cardiovascular;  Laterality: N/A;   DIALYSIS/PERMA CATHETER REMOVAL N/A 05/22/2023   Procedure: DIALYSIS/PERMA CATHETER REMOVAL;  Surgeon: Melia Lynwood ORN, MD;  Location: [facility] INVASIVE CV LAB;  Service: Cardiovascular;  Laterality: N/A;   ESOPHAGOGASTRODUODENOSCOPY (EGD) WITH PROPOFOL  N/A 02/18/2020   Procedure: ESOPHAGOGASTRODUODENOSCOPY (EGD) WITH PROPOFOL ;  Surgeon: Leigh Elspeth SQUIBB, MD;  Location: [facility] ENDOSCOPY;  Service: Gastroenterology;  Laterality: N/A;   FISTULOGRAM  07/29/2023   Procedure: FISTULOGRAM;  Surgeon: Lanis Fonda BRAVO, MD;  Location: [facility] OR;  Service: Vascular;;   INSERTION OF ARTERIOVENOUS (AV) ARTEGRAFT ARM Left 11/18/2023   Procedure: INSERTION, GRAFT, ARTERIOVENOUS, UPPER EXTREMITY;  Surgeon: Lanis Fonda BRAVO, MD;  Location: [facility] OR;  Service: Vascular;  Laterality: Left;   IR FLUORO GUIDE CV LINE RIGHT  02/14/2022   IR US  GUIDE VASC ACCESS RIGHT  02/14/2022   LIGATION OF ARTERIOVENOUS  FISTULA  11/18/2023   Procedure: LIGATION OF previous ARTERIOVENOUS  FISTULA and redo exposure of brachial artery;  Surgeon: Lanis Fonda BRAVO, MD;  Location: [facility] OR;  Service: Vascular;;   PERIPHERAL VASCULAR BALLOON ANGIOPLASTY  05/31/2023  Procedure: PERIPHERAL VASCULAR BALLOON ANGIOPLASTY;  Surgeon: Melia Lynwood ORN, MD;  Location: [facility] INVASIVE CV LAB;  Service: Cardiovascular;;  lt arm fistula   PERIPHERAL VASCULAR BALLOON ANGIOPLASTY  07/17/2023   Procedure: PERIPHERAL VASCULAR BALLOON ANGIOPLASTY;  Surgeon: Melia Lynwood ORN, MD;  Location: [facility] INVASIVE CV LAB;  Service: Cardiovascular;;  Arterial Anastomosis; Outflow Basilic Vein   PERIPHERAL VASCULAR BALLOON  ANGIOPLASTY  07/17/2023   Procedure: PERIPHERAL VASCULAR BALLOON ANGIOPLASTY;  Surgeon: Melia Lynwood ORN, MD;  Location: [facility] INVASIVE CV LAB;  Service: Cardiovascular;;  right innominate   POLYPECTOMY  02/18/2020   Procedure: POLYPECTOMY;  Surgeon: Leigh Elspeth SQUIBB, MD;  Location: [facility] ENDOSCOPY;  Service: Gastroenterology;;    Allergies  Allergen Reactions   Lisinopril Cough   Semaglutide Other (See Comments)    Indigestion  *Ozempic    Prior to Admission medications   Medication Sig Start Date End Date Taking? Authorizing Provider  cinacalcet (SENSIPAR) 30 MG tablet Take 30 mg by mouth daily. Tues, Thurs, and Sat    [provider]  Ensure (ENSURE) Take 237 mLs by mouth 2 (two) times daily.    [provider]  glucose 4 GM chewable tablet Chew 4 tablets by mouth daily as needed for low blood sugar.  for low blood sugar, repeat every 15 minutes if blood sugar is less than 70    [provider]  HYDROcodone -acetaminophen  (NORCO/VICODIN) 5-325 MG tablet Take 1 tablet by mouth every 6 (six) hours as needed for moderate pain (pain score 4-6). 11/18/23   Bethanie Cough, PA-C  insulin  glargine (LANTUS ) 100 UNIT/ML injection Inject 8 Units into the skin every morning.    [provider]  latanoprost  (XALATAN ) 0.005 % ophthalmic solution Place 1 drop into both eyes every evening.    [provider]  sevelamer  carbonate (RENVELA ) 800 MG tablet Take 800 mg by mouth 3 (three) times daily before meals.    [provider]  traMADol  (ULTRAM ) 50 MG tablet Take 1 tablet (50 mg total) by mouth every 6 (six) hours as needed. Patient taking differently: Take 50 mg by mouth every 6 (six) hours as needed for moderate pain (pain score 4-6). 01/28/23 01/28/24  Gerome Maurilio HERO, PA-C  UNABLE TO FIND Take 1 fluid ounce by mouth daily. Med Name: GWENDOLYNN GILDARDO LOVE   food supplement    [provider]    Social History   Socioeconomic History   Marital  status: Married    Spouse name: Not on file   Number of children: Not on file   Years of education: Not on file   Highest education level: Not on file  Occupational History   Occupation: retired  Tobacco Use   Smoking status: Former    Current packs/day: 0.00    Average packs/day: 1 pack/day for 15.0 years (15.0 ttl pk-yrs)    Types: Cigarettes    Start date: 47    Quit date: 1974    Years since quitting: 51.6   Smokeless tobacco: Never  Vaping Use   Vaping status: Never Used  Substance and Sexual Activity   Alcohol  use: Not Currently    Comment: quit in 1974, binge drinking prior   Drug use: Never   Sexual activity: Not Currently  Other Topics Concern   Not on file  Social History Narrative   Not on file   Social Drivers of Health   Financial Resource Strain: Not on file  Food Insecurity: No Food Insecurity (02/14/2022)   Hunger Vital Sign  Worried About Programme researcher, broadcasting/film/video in the Last Year: Never true    Ran Out of Food in the Last Year: Never true  Transportation Needs: No Transportation Needs (02/14/2022)   PRAPARE - Administrator, Civil Service (Medical): No    Lack of Transportation (Non-Medical): No  Physical Activity: Not on file  Stress: Not on file  Social Connections: Not on file  Intimate Partner Violence: Not At Risk (02/14/2022)   Humiliation, Afraid, Rape, and Kick questionnaire    Fear of Current or Ex-Partner: No    Emotionally Abused: No    Physically Abused: No    Sexually Abused: No     No family history on file.  ROS: Left arm graft occluded  Physical Examination  Vitals:   01/23/24 0745  BP: 125/61  Pulse: 69  Resp: 12  Temp: 97.9 F (36.6 C)  SpO2: 99%   Awake alert and oriented Nonlabored respirations Left arm AV graft with no thrill or pulsatility There is a catheter in his right chest has a clean dressing there is no evidence of infection  CBC    Component Value Date/Time   WBC 6.5 02/27/2023 0958   WBC 7.6  06/03/2022 1509   RBC 2.97 (L) 02/27/2023 0958   HGB 8.8 (L) 11/18/2023 0927   HGB 9.4 (L) 02/27/2023 0958   HCT 26.0 (L) 11/18/2023 0927   PLT 229 02/27/2023 0958   MCV 94.9 02/27/2023 0958   MCH 31.6 02/27/2023 0958   MCHC 33.3 02/27/2023 0958   RDW 12.8 02/27/2023 0958   LYMPHSABS 1.9 02/27/2023 0958   MONOABS 0.8 02/27/2023 0958   EOSABS 0.3 02/27/2023 0958   BASOSABS 0.0 02/27/2023 0958    BMET    Component Value Date/Time   NA 136 11/18/2023 0927   K 4.6 11/18/2023 0927   CL 96 (L) 11/18/2023 0927   CO2 29 02/27/2023 0958   GLUCOSE 165 (H) 11/18/2023 0927   BUN 42 (H) 11/18/2023 0927   CREATININE 8.50 (H) 11/18/2023 0927   CREATININE 6.82 (H) 02/27/2023 0958   CALCIUM  8.4 (L) 02/27/2023 0958   GFRNONAA 8 (L) 02/27/2023 0958   GFRAA 12 (L) 02/21/2020 0336    COAGS: No results found for: INR, PROTIME    ASSESSMENT/PLAN: This is a 83 y.o. 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  Plan for attempted graft thrombectomy today.  We discussed the risk and benefits and consent was signed.  Barclay Lennox C. Sheree, MD [facility] [place] Office: [phone] Pager: [phone]

## 2024-01-23 NOTE — Op Note (Signed)
 Patient name: Paul Bond MRN: [id number] DOB: [birth year] Sex: male  01/23/2024 Pre-operative Diagnosis: End stage renal disease, thrombosed left upper extremity AV graft Post-operative diagnosis:  Same Surgeon:  Penne BROCKS. Sheree, MD Procedure Performed: 1.  Percutaneous ultrasound-guided cannulation left upper arm AV graft and antegrade and retrograde directions 2.  Left upper extremity graft thromboembolectomy using 7 French Vista Brite tip catheter directed centrally and 5 [place] Fogarty balloon directed towards the arterial limb 3.  Balloon angioplasty left upper extremity outflow veins including axillary, subclavian, left innominate veins with 8 mm balloon 4.  Moderate sedation with fentanyl  and Versed  for 18 minutes   Indications: 83 year old male with history of end-stage renal disease currently has a right IJ tunneled dialysis catheter.  Recently he had a left upper arm AV graft placed which was used for 2 weeks but was noted to be thrombosed earlier this week.  He is now indicated for thrombectomy.  Findings: The graft was thrombosed.  Centrally there appears to be stenosis at the level of the subclavian innominate junction at the level of the thoracic outlet but this had brisk flow at completion.  There is also possible stenosis at the axillary vein just after the anastomosis to the graft this was balloon angioplasty and there was brisk flow at completion.  At completion there was a strong thrill in the graft and a palpable left radial artery pulse.  The graft remains amenable to future percutaneous access and if it thromboses again I would consider stenting the outflow.   Procedure:  The patient was identified in the holding area and taken to the heart and vascular procedure room.  The patient was then placed supine on the table and prepped and draped in the usual sterile fashion.  A time out was called.  Ultrasound was used to evaluate the left arm AV graft.  I first anesthetized  the skin near the arterial anastomosis and directed a micropuncture needle centrally.  A wire was then placed this was done with ultrasound guidance and an image saved the permanent record.  We then placed a micropuncture sheath followed by a Bentson wire centrally under fluoroscopic guidance and a 7 French sheath was placed and a Vista Brite tip catheter was placed centrally.  Angiography was performed and we confirmed central patency.  I then administered 8000 units of heparin  and moderate sedation with fentanyl  and Versed .  We then performed mechanical thrombectomy using the Vista Brite tip catheter.  The wire was then replaced centrally and I used an 8 mm balloon to direct angioplasty starting within the graft and including the left axillary vein into the left subclavian vein and left innominate vein angioplasty.  I then used ultrasound and cannulated the graft using a micropuncture needle followed by wire and the sheath towards the arterial limb.  Again an ultrasound images saved the permanent record.  I placed a Bentson wire into the artery and a 6 French sheath was placed towards the arterial anastomosis.  Using an over wire the Fogarty we then pulled the plug in the graft.  The Fogarty was placed back in the artery and angiography confirmed that the arteriovenous anastomosis was patent.  This wire and Foley were then removed and the 6 French sheath was replaced.  We then performed angiography from the 7 French sheath this demonstrated patency and there was a strong flow.  There was some concern of residual stenosis at the venous outflow but I elected not to stent given that  the graft was previously working and there was a very strong thrill.  We then removed the sheath and suture-ligated the cannulation sites.  There was a palpable thrill in the graft and a palpable left radial pulse at completion.  He tolerated the procedure without any complication.   Contrast; 25cc  Elzina Devera C. Sheree, MD [facility] [place] Office: [phone] Pager: [phone]

## 2024-01-24 ENCOUNTER — Encounter (HOSPITAL_COMMUNITY): Payer: Self-pay | Admitting: Vascular Surgery

## 2024-01-29 ENCOUNTER — Encounter (HOSPITAL_COMMUNITY): Payer: Self-pay

## 2024-01-29 ENCOUNTER — Ambulatory Visit (HOSPITAL_COMMUNITY)

## 2024-02-20 ENCOUNTER — Encounter: Payer: Self-pay | Admitting: Physician Assistant

## 2024-02-25 ENCOUNTER — Other Ambulatory Visit (HOSPITAL_COMMUNITY): Payer: Self-pay | Admitting: Nephrology

## 2024-02-25 DIAGNOSIS — E1121 Type 2 diabetes mellitus with diabetic nephropathy: Secondary | ICD-10-CM

## 2024-03-13 ENCOUNTER — Ambulatory Visit (HOSPITAL_COMMUNITY)
Admission: RE | Admit: 2024-03-13 | Discharge: 2024-03-13 | Disposition: A | Discharge status: Home / Self Care | Source: Ambulatory Visit | Attending: Nephrology | Admitting: Nephrology

## 2024-03-13 DIAGNOSIS — Z4901 Encounter for fitting and adjustment of extracorporeal dialysis catheter: Secondary | ICD-10-CM | POA: Insufficient documentation

## 2024-03-13 DIAGNOSIS — E1121 Type 2 diabetes mellitus with diabetic nephropathy: Secondary | ICD-10-CM

## 2024-03-13 HISTORY — PX: IR REMOVAL TUN CV CATH W/O FL: IMG2289

## 2024-03-13 MED ORDER — LIDOCAINE-EPINEPHRINE 1 %-1:100000 IJ SOLN
INTRAMUSCULAR | Status: AC
Start: 2024-03-13 — End: 2024-03-13
  Filled 2024-03-13: qty 1

## 2024-03-13 MED ORDER — LIDOCAINE-EPINEPHRINE 1 %-1:100000 IJ SOLN: 20.0000 mL | Freq: Once | INTRAMUSCULAR | Status: DC

## 2024-03-13 NOTE — Procedures (Signed)
 Successful removal of right IJ tunneled HD catheter.   After obtaining consent and performing a time-out, the right upper chest was prepped and draped in the normal sterile fashion. The heparin  was removed from both ports. 1% lidocaine  was used for local anesthesia. Using gentle blunt dissection and moderate manual traction the cuff of the catheter was exposed and the catheter was removed in its entirety. Pressure was held until hemostasis was obtained. A sterile dressing was applied. The patient tolerated the procedure well with no immediate complications.    Warren Dais, AGACNP-BC 03/13/2024, 11:43 AM

## 2024-03-31 ENCOUNTER — Other Ambulatory Visit (HOSPITAL_COMMUNITY): Payer: Self-pay | Admitting: Nephrology

## 2024-03-31 ENCOUNTER — Ambulatory Visit (HOSPITAL_COMMUNITY)

## 2024-03-31 ENCOUNTER — Other Ambulatory Visit: Payer: Self-pay

## 2024-03-31 DIAGNOSIS — N186 End stage renal disease: Secondary | ICD-10-CM

## 2024-03-31 DIAGNOSIS — Z01812 Encounter for preprocedural laboratory examination: Secondary | ICD-10-CM

## 2024-04-01 ENCOUNTER — Other Ambulatory Visit (HOSPITAL_COMMUNITY): Payer: Self-pay | Admitting: Nephrology

## 2024-04-01 ENCOUNTER — Ambulatory Visit (HOSPITAL_COMMUNITY)
Admission: RE | Admit: 2024-04-01 | Discharge: 2024-04-01 | Disposition: A | Discharge status: Home / Self Care | Source: Ambulatory Visit | Attending: Nephrology | Admitting: Nephrology

## 2024-04-01 ENCOUNTER — Other Ambulatory Visit: Payer: Self-pay

## 2024-04-01 DIAGNOSIS — E1122 Type 2 diabetes mellitus with diabetic chronic kidney disease: Secondary | ICD-10-CM | POA: Diagnosis not present

## 2024-04-01 DIAGNOSIS — I120 Hypertensive chronic kidney disease with stage 5 chronic kidney disease or end stage renal disease: Secondary | ICD-10-CM | POA: Diagnosis not present

## 2024-04-01 DIAGNOSIS — N186 End stage renal disease: Secondary | ICD-10-CM

## 2024-04-01 DIAGNOSIS — Z01812 Encounter for preprocedural laboratory examination: Secondary | ICD-10-CM

## 2024-04-01 DIAGNOSIS — Y832 Surgical operation with anastomosis, bypass or graft as the cause of abnormal reaction of the patient, or of later complication, without mention of misadventure at the time of the procedure: Secondary | ICD-10-CM | POA: Insufficient documentation

## 2024-04-01 DIAGNOSIS — Z794 Long term (current) use of insulin: Secondary | ICD-10-CM | POA: Diagnosis not present

## 2024-04-01 DIAGNOSIS — Z87891 Personal history of nicotine dependence: Secondary | ICD-10-CM | POA: Diagnosis not present

## 2024-04-01 DIAGNOSIS — I871 Compression of vein: Secondary | ICD-10-CM | POA: Insufficient documentation

## 2024-04-01 DIAGNOSIS — T82858A Stenosis of vascular prosthetic devices, implants and grafts, initial encounter: Secondary | ICD-10-CM | POA: Insufficient documentation

## 2024-04-01 DIAGNOSIS — T82868A Thrombosis of vascular prosthetic devices, implants and grafts, initial encounter: Secondary | ICD-10-CM | POA: Diagnosis not present

## 2024-04-01 HISTORY — PX: IR THROMBECTOMY AV FISTULA W/THROMBOLYSIS/PTA INC/SHUNT/IMG LEFT: IMG6106

## 2024-04-01 HISTORY — PX: IR US GUIDE VASC ACCESS LEFT: IMG2389

## 2024-04-01 LAB — BASIC METABOLIC PANEL WITH GFR
Anion gap: 19 — ABNORMAL HIGH (ref 5–15)
BUN: 72 mg/dL — ABNORMAL HIGH (ref 8–23)
CO2: 28 mmol/L (ref 22–32)
Calcium: 9.3 mg/dL (ref 8.9–10.3)
Chloride: 89 mmol/L — ABNORMAL LOW (ref 98–111)
Creatinine, Ser: 13.14 mg/dL — ABNORMAL HIGH (ref 0.61–1.24)
GFR, Estimated: 3 mL/min — ABNORMAL LOW (ref >60)
Glucose, Bld: 178 mg/dL — ABNORMAL HIGH (ref 70–99)
Potassium: 4.5 mmol/L (ref 3.5–5.1)
Sodium: 136 mmol/L (ref 135–145)

## 2024-04-01 LAB — CBC
HCT: 25 % — ABNORMAL LOW (ref 39.0–52.0)
Hemoglobin: 8.4 g/dL — ABNORMAL LOW (ref 13.0–17.0)
MCH: 31 pg (ref 26.0–34.0)
MCHC: 33.6 g/dL (ref 30.0–36.0)
MCV: 92.3 fL (ref 80.0–100.0)
Platelets: 284 K/uL (ref 150–400)
RBC: 2.71 MIL/uL — ABNORMAL LOW (ref 4.22–5.81)
RDW: 13.7 % (ref 11.5–15.5)
WBC: 12.7 K/uL — ABNORMAL HIGH (ref 4.0–10.5)
nRBC: 0 % (ref 0.0–0.2)

## 2024-04-01 LAB — GLUCOSE, CAPILLARY
Glucose-Capillary: 160 mg/dL — ABNORMAL HIGH (ref 70–99)
Glucose-Capillary: 151 mg/dL — ABNORMAL HIGH (ref 70–99)

## 2024-04-01 MED ORDER — MIDAZOLAM HCL (PF) 2 MG/2ML IJ SOLN
INTRAMUSCULAR | Status: AC | PRN
  Administered 2024-04-01 (×2): .5 mg via INTRAVENOUS

## 2024-04-01 MED ORDER — FENTANYL CITRATE (PF) 100 MCG/2ML IJ SOLN
INTRAMUSCULAR | Status: AC
  Filled 2024-04-01: qty 2

## 2024-04-01 MED ORDER — IOHEXOL 300 MG/ML  SOLN
100.0000 mL | Freq: Once | INTRAMUSCULAR | Status: AC | PRN
  Administered 2024-04-01: 70 mL via INTRAVENOUS

## 2024-04-01 MED ORDER — LIDOCAINE-EPINEPHRINE 1 %-1:100000 IJ SOLN
INTRAMUSCULAR | Status: AC
  Filled 2024-04-01: qty 1

## 2024-04-01 MED ORDER — FENTANYL CITRATE (PF) 100 MCG/2ML IJ SOLN
INTRAMUSCULAR | Status: AC | PRN
  Administered 2024-04-01 (×2): 25 ug via INTRAVENOUS

## 2024-04-01 MED ORDER — MIDAZOLAM HCL 2 MG/2ML IJ SOLN
INTRAMUSCULAR | Status: AC
  Filled 2024-04-01: qty 2

## 2024-04-01 MED ORDER — HEPARIN SODIUM (PORCINE) 1000 UNIT/ML IJ SOLN
INTRAMUSCULAR | Status: AC
  Filled 2024-04-01: qty 10

## 2024-04-01 MED ORDER — ALTEPLASE 2 MG IJ SOLR
INTRAMUSCULAR | Status: AC
  Filled 2024-04-01: qty 4

## 2024-04-01 MED ORDER — SODIUM CHLORIDE 0.9 % IV SOLN: INTRAVENOUS | Status: DC

## 2024-04-01 MED ORDER — LIDOCAINE-EPINEPHRINE 1 %-1:100000 IJ SOLN
20.0000 mL | Freq: Once | INTRAMUSCULAR | Status: AC
  Administered 2024-04-01: 10 mL via INTRADERMAL

## 2024-04-01 NOTE — Procedures (Signed)
 Interventional Radiology Procedure Note  Procedure: LUE AVG DECLOT INTERVENTION W 7 AND 12 MM VENOUS OUTFLOW PTA    Complications: None  Estimated Blood Loss:  MIN  Findings: SUCCESSFUL DECLOT INTERVENTION FULL REPORT IN PACS     EMERSON FREDERIC SPECKING, MD

## 2024-04-01 NOTE — H&P (Signed)
 Chief Complaint: Thrombosed left upper extremity AV Graft - IR consulted for declot, possible stent placement   Referring Provider(s): Penninger, Manuelita, [place]   Supervising Physician: Vanice Revel  Patient Status: [facility] - Out-pt  History of Present Illness: Paul Bond is a 83 y.o. male with hx of CKD requiring HD. Pt with left arm brachiobasilic graft that was ligated by vascular surgery 11/18/23.  This worked for approximately 2 weeks, then failed to mature and occluded. He maintained a [facility] which he received dialysis through. On 01/23/24 he had thromboembolectomy and balloon angioplasty of the graft and outflow veins. The graft was amenable to use at that time. Per vascular recommendations, if graft were to become occluded again, recommend stenting the outflow. On 03/13/24, he had his TDC removed by IR as graft was working well at that time. He then went for HD yesterday and they were unable to cannulate graft again. IR consulted for the declot and likely stent placement.   Today patient without complaint. Has been npo since midnight. Denies any anticoagulation use.   Patient is Full Code  Past Medical History:  Diagnosis Date   Chronic indwelling Foley catheter    Chronic kidney disease    dialysis on tues thurs sat   Diabetes mellitus without complication (HCC)    type 2   Dysrhythmia    PAF with RVR episode 02/03/20 in setting of severe sepsis   Hypertension    Rectal cancer (HCC)    Stroke [facility])     Past Surgical History:  Procedure Laterality Date   A/V FISTULAGRAM Left 05/31/2023   Procedure: A/V Fistulagram;  Surgeon: Melia Lynwood ORN, MD;  Location: [facility] INVASIVE CV LAB;  Service: Cardiovascular;  Laterality: Left;   A/V FISTULAGRAM N/A 07/17/2023   Procedure: A/V Fistulagram;  Surgeon: Melia Lynwood ORN, MD;  Location: [facility] INVASIVE CV LAB;  Service: Cardiovascular;  Laterality: N/A;   AV FISTULA PLACEMENT Left 12/03/2022   Procedure: LEFT ARM ARTERIOVENOUS FISTULA CREATION;   Surgeon: Lanis Fonda BRAVO, MD;  Location: [facility] OR;  Service: Vascular;  Laterality: Left;   BASCILIC VEIN TRANSPOSITION Left 01/28/2023   Procedure: LEFT ARM SECOND STAGE BASILIC VEIN TRANSPOSITION;  Surgeon: Lanis Fonda BRAVO, MD;  Location: [facility] OR;  Service: Vascular;  Laterality: Left;   BIOPSY  02/18/2020   Procedure: BIOPSY;  Surgeon: Leigh Elspeth SQUIBB, MD;  Location: [facility] ENDOSCOPY;  Service: Gastroenterology;;   COLON SURGERY     COLONOSCOPY  02/18/2020   COLONOSCOPY WITH PROPOFOL  N/A 02/18/2020   Procedure: COLONOSCOPY WITH PROPOFOL ;  Surgeon: Leigh Elspeth SQUIBB, MD;  Location: [facility] ENDOSCOPY;  Service: Gastroenterology;  Laterality: N/A;   CYSTOSCOPY W/ URETERAL STENT PLACEMENT Right 02/03/2020   Procedure: CYSTOSCOPY WITH RETROGRADE PYELOGRAM/URETERAL DOUBLE J STENT PLACEMENT;  Surgeon: Matilda Senior, MD;  Location: [facility] OR;  Service: Urology;  Laterality: Right;   DIALYSIS/PERMA CATHETER INSERTION N/A 07/17/2023   Procedure: DIALYSIS/PERMA CATHETER INSERTION;  Surgeon: Melia Lynwood ORN, MD;  Location: [facility] INVASIVE CV LAB;  Service: Cardiovascular;  Laterality: N/A;   DIALYSIS/PERMA CATHETER REMOVAL N/A 05/22/2023   Procedure: DIALYSIS/PERMA CATHETER REMOVAL;  Surgeon: Melia Lynwood ORN, MD;  Location: [facility] INVASIVE CV LAB;  Service: Cardiovascular;  Laterality: N/A;   ESOPHAGOGASTRODUODENOSCOPY (EGD) WITH PROPOFOL  N/A 02/18/2020   Procedure: ESOPHAGOGASTRODUODENOSCOPY (EGD) WITH PROPOFOL ;  Surgeon: Leigh Elspeth SQUIBB, MD;  Location: [facility] ENDOSCOPY;  Service: Gastroenterology;  Laterality: N/A;   FISTULOGRAM  07/29/2023   Procedure: FISTULOGRAM;  Surgeon: Lanis Fonda BRAVO, MD;  Location: [facility]  OR;  Service: Vascular;;   INSERTION OF ARTERIOVENOUS (AV) ARTEGRAFT ARM Left 11/18/2023   Procedure: INSERTION, GRAFT, ARTERIOVENOUS, UPPER EXTREMITY;  Surgeon: Lanis Fonda BRAVO, MD;  Location: [facility] OR;  Service: Vascular;  Laterality: Left;   IR FLUORO GUIDE CV LINE RIGHT  02/14/2022   IR REMOVAL TUN CV CATH W/O FL   03/13/2024   IR US  GUIDE VASC ACCESS RIGHT  02/14/2022   LIGATION OF ARTERIOVENOUS  FISTULA  11/18/2023   Procedure: LIGATION OF previous ARTERIOVENOUS  FISTULA and redo exposure of brachial artery;  Surgeon: Lanis Fonda BRAVO, MD;  Location: [facility] OR;  Service: Vascular;;   PERIPHERAL VASCULAR BALLOON ANGIOPLASTY  05/31/2023   Procedure: PERIPHERAL VASCULAR BALLOON ANGIOPLASTY;  Surgeon: Melia Lynwood ORN, MD;  Location: [facility] INVASIVE CV LAB;  Service: Cardiovascular;;  lt arm fistula   PERIPHERAL VASCULAR BALLOON ANGIOPLASTY  07/17/2023   Procedure: PERIPHERAL VASCULAR BALLOON ANGIOPLASTY;  Surgeon: Melia Lynwood ORN, MD;  Location: [facility] INVASIVE CV LAB;  Service: Cardiovascular;;  Arterial Anastomosis; Outflow Basilic Vein   PERIPHERAL VASCULAR BALLOON ANGIOPLASTY  07/17/2023   Procedure: PERIPHERAL VASCULAR BALLOON ANGIOPLASTY;  Surgeon: Melia Lynwood ORN, MD;  Location: [facility] INVASIVE CV LAB;  Service: Cardiovascular;;  right innominate   PERIPHERAL VASCULAR THROMBECTOMY Left 01/23/2024   Procedure: PERIPHERAL VASCULAR THROMBECTOMY;  Surgeon: Sheree Penne Bruckner, MD;  Location: [facility] LAB;  Service: Cardiovascular;  Laterality: Left;   POLYPECTOMY  02/18/2020   Procedure: POLYPECTOMY;  Surgeon: Leigh Elspeth SQUIBB, MD;  Location: [facility] ENDOSCOPY;  Service: Gastroenterology;;    Allergies: Lisinopril and Semaglutide  Medications: Prior to Admission medications   Medication Sig Start Date End Date Taking? Authorizing Provider  cinacalcet (SENSIPAR) 30 MG tablet Take 30 mg by mouth daily. Tues, Thurs, and Sat   Yes [provider]  Ensure (ENSURE) Take 237 mLs by mouth 2 (two) times daily.   Yes [provider]  latanoprost  (XALATAN ) 0.005 % ophthalmic solution Place 1 drop into both eyes every evening.   Yes [provider]  sevelamer  carbonate (RENVELA ) 800 MG tablet Take 800 mg by mouth 3 (three) times daily before meals.   Yes [provider]  glucose 4 GM chewable tablet Chew 4  tablets by mouth daily as needed for low blood sugar.  for low blood sugar, repeat every 15 minutes if blood sugar is less than 70    [provider]  HYDROcodone -acetaminophen  (NORCO/VICODIN) 5-325 MG tablet Take 1 tablet by mouth every 6 (six) hours as needed for moderate pain (pain score 4-6). 11/18/23   Bethanie Cough, PA-C  insulin  glargine (LANTUS ) 100 UNIT/ML injection Inject 8 Units into the skin every morning.    [provider]  UNABLE TO FIND Take 1 fluid ounce by mouth daily. Med Name: GWENDOLYNN GILDARDO LOVE   food supplement    [provider]     No family history on file.  Social History   Socioeconomic History   Marital status: Married    Spouse name: Not on file   Number of children: Not on file   Years of education: Not on file   Highest education level: Not on file  Occupational History   Occupation: retired  Tobacco Use   Smoking status: Former    Current packs/day: 0.00    Average packs/day: 1 pack/day for 15.0 years (15.0 ttl pk-yrs)    Types: Cigarettes    Start date: 76    Quit date: 1974    Years since quitting: 87.8  Smokeless tobacco: Never  Vaping Use   Vaping status: Never Used  Substance and Sexual Activity   Alcohol  use: Not Currently    Comment: quit in 1974, binge drinking prior   Drug use: Never   Sexual activity: Not Currently  Other Topics Concern   Not on file  Social History Narrative   Not on file   Social Drivers of Health   Financial Resource Strain: Not on file  Food Insecurity: No Food Insecurity (02/14/2022)   Hunger Vital Sign    Worried About Running Out of Food in the Last Year: Never true    Ran Out of Food in the Last Year: Never true  Transportation Needs: No Transportation Needs (02/14/2022)   PRAPARE - Administrator, Civil Service (Medical): No    Lack of Transportation (Non-Medical): No  Physical Activity: Not on file  Stress: Not on file  Social Connections: Not on file      Review of Systems: A 12 point ROS discussed and pertinent positives are indicated in the HPI above.  All other systems are negative.   Vital Signs: BP (!) 117/57   Pulse 79   Temp 98.8 F (37.1 C) (Oral)   Resp 16   Ht 5' 7 (1.702 m)   Wt 140 lb (63.5 kg)   SpO2 100%   BMI 21.93 kg/m   Advance Care Plan: No documents on file  Physical Exam Vitals and nursing note reviewed.  Constitutional:      General: He is not in acute distress. HENT:     Mouth/Throat:     Mouth: Mucous membranes are moist.     Pharynx: Oropharynx is clear.  Cardiovascular:     Rate and Rhythm: Normal rate and regular rhythm.  Pulmonary:     Effort: Pulmonary effort is normal.     Breath sounds: Normal breath sounds.  Abdominal:     Palpations: Abdomen is soft.     Tenderness: There is no abdominal tenderness.  Musculoskeletal:     Right lower leg: No edema.     Left lower leg: No edema.  Skin:    General: Skin is warm and dry.  Neurological:     Mental Status: He is alert and oriented to person, place, and time. Mental status is at baseline.     Imaging: IR Removal Tun Cv Cath W/O FL Result Date: 03/13/2024 INDICATION: Patient with a history of end-stage renal disease on hemodialysis via right IJ tunneled dialysis catheter placed by Nephrology July 17, 2023. Patient now has a functioning left upper extremity AV graft. Interventional Radiology asked to remove tunneled dialysis catheter. EXAM: REMOVAL TUNNELED CENTRAL VENOUS CATHETER MEDICATIONS: 1% lidocaine  6 mL ANESTHESIA/SEDATION: None FLUOROSCOPY: None COMPLICATIONS: None immediate. PROCEDURE: Informed written consent was obtained from the patient after a thorough discussion of the procedural risks, benefits and alternatives. All questions were addressed. Sterile Barrier Technique was utilized including mask, gowns, sterile gloves, sterile drape, hand hygiene and skin antiseptic. A timeout was performed prior to the initiation of the  procedure. The patient's right chest and catheter was prepped and draped in a normal sterile fashion. Heparin  was removed from both ports of catheter. 1% lidocaine  was used for local anesthesia. Using gentle blunt dissection and moderate manual traction the cuff of the catheter was exposed and the catheter was removed in it's entirety. Pressure was held till hemostasis was obtained. A sterile dressing was applied. The patient tolerated the procedure well with no immediate complications. IMPRESSION:  Successful catheter removal as described above. Procedure performed by Warren Dais, NP Electronically Signed   By: Juliene Balder M.D.   On: 03/13/2024 15:57    Labs:  CBC: Recent Labs    05/22/23 1252 07/29/23 1046 11/18/23 0927 04/01/24 0806  WBC  --   --   --  12.7*  HGB 8.8* 9.5* 8.8* 8.4*  HCT 26.0* 28.0* 26.0* 25.0*  PLT  --   --   --  284    COAGS: No results for input(s): INR, APTT in the last 8760 hours.  BMP: Recent Labs    05/22/23 1252 07/29/23 1046 11/18/23 0927  NA 134* 137 136  K 5.0 4.7 4.6  CL 93* 98 96*  GLUCOSE 260* 182* 165*  BUN 57* 45* 42*  CREATININE 8.40* 8.70* 8.50*    LIVER FUNCTION TESTS: No results for input(s): BILITOT, AST, ALT, ALKPHOS, PROT, ALBUMIN in the last 8760 hours.  TUMOR MARKERS: No results for input(s): AFPTM, CEA, CA199, CHROMGRNA in the last 8760 hours.  Assessment and Plan:  Paul Bond is a 83 y.o. male with hx of CKD requiring HD. Pt with left arm brachiobasilic graft that was ligated by vascular surgery 11/18/23.  This worked for approximately 2 weeks, then failed to mature and occluded. He maintained a [facility] which he received dialysis through. On 01/23/24 he had thromboembolectomy and balloon angioplasty of the graft and outflow veins. The graft was amenable to use at that time. Per vascular recommendations, if graft were to become occluded again, recommend stenting the outflow. On 03/13/24, he had his TDC  removed by IR as graft was working well at that time. He then went for HD yesterday and they were unable to cannulate graft again. IR consulted for the declot and likely stent placement.   Risks and benefits discussed with the patient including, but not limited to bleeding, infection, vascular injury, pulmonary embolism, need for tunneled HD catheter placement or even death.  All of the patient's questions were answered, patient is agreeable to proceed. Consent signed and in chart.   Thank you for allowing our service to participate in Paul Bond 's care.  Electronically Signed: Kimble VEAR Clas, PA-C   04/01/2024, 8:31 AM      I spent a total of  30 Minutes   in face to face in clinical consultation, greater than 50% of which was counseling/coordinating care for AV graft declot, stenting.

## 2024-04-11 ENCOUNTER — Other Ambulatory Visit (HOSPITAL_COMMUNITY): Payer: Self-pay | Admitting: Diagnostic Radiology

## 2024-04-11 ENCOUNTER — Encounter (HOSPITAL_COMMUNITY): Payer: Self-pay

## 2024-04-11 ENCOUNTER — Emergency Department (HOSPITAL_COMMUNITY)
Admission: EM | Admit: 2024-04-11 | Discharge: 2024-04-11 | Disposition: A | Discharge status: Home / Self Care | Attending: Emergency Medicine | Admitting: Emergency Medicine

## 2024-04-11 ENCOUNTER — Other Ambulatory Visit: Payer: Self-pay

## 2024-04-11 DIAGNOSIS — Z992 Dependence on renal dialysis: Secondary | ICD-10-CM | POA: Insufficient documentation

## 2024-04-11 DIAGNOSIS — Y718 Miscellaneous cardiovascular devices associated with adverse incidents, not elsewhere classified: Secondary | ICD-10-CM | POA: Insufficient documentation

## 2024-04-11 DIAGNOSIS — T82868A Thrombosis of vascular prosthetic devices, implants and grafts, initial encounter: Secondary | ICD-10-CM | POA: Diagnosis present

## 2024-04-11 DIAGNOSIS — T82868S Thrombosis of vascular prosthetic devices, implants and grafts, sequela: Secondary | ICD-10-CM

## 2024-04-11 LAB — COMPREHENSIVE METABOLIC PANEL WITH GFR
ALT: 10 U/L (ref 0–44)
AST: 15 U/L (ref 15–41)
Albumin: 3.1 g/dL — ABNORMAL LOW (ref 3.5–5.0)
Alkaline Phosphatase: 60 U/L (ref 38–126)
Anion gap: 18 — ABNORMAL HIGH (ref 5–15)
BUN: 45 mg/dL — ABNORMAL HIGH (ref 8–23)
CO2: 29 mmol/L (ref 22–32)
Calcium: 8.9 mg/dL (ref 8.9–10.3)
Chloride: 93 mmol/L — ABNORMAL LOW (ref 98–111)
Creatinine, Ser: 7.87 mg/dL — ABNORMAL HIGH (ref 0.61–1.24)
GFR, Estimated: 6 mL/min — ABNORMAL LOW (ref >60)
Glucose, Bld: 167 mg/dL — ABNORMAL HIGH (ref 70–99)
Potassium: 5 mmol/L (ref 3.5–5.1)
Sodium: 140 mmol/L (ref 135–145)
Total Bilirubin: 0.5 mg/dL (ref 0.0–1.2)
Total Protein: 7.4 g/dL (ref 6.5–8.1)

## 2024-04-11 LAB — CBC
HCT: 26.6 % — ABNORMAL LOW (ref 39.0–52.0)
Hemoglobin: 8.9 g/dL — ABNORMAL LOW (ref 13.0–17.0)
MCH: 31.1 pg (ref 26.0–34.0)
MCHC: 33.5 g/dL (ref 30.0–36.0)
MCV: 93 fL (ref 80.0–100.0)
Platelets: 320 K/uL (ref 150–400)
RBC: 2.86 MIL/uL — ABNORMAL LOW (ref 4.22–5.81)
RDW: 13.6 % (ref 11.5–15.5)
WBC: 8 K/uL (ref 4.0–10.5)
nRBC: 0 % (ref 0.0–0.2)

## 2024-04-11 MED ORDER — SODIUM ZIRCONIUM CYCLOSILICATE 10 G PO PACK
10.0000 g | PACK | ORAL | Status: AC
  Administered 2024-04-11: 10 g via ORAL
  Filled 2024-04-11: qty 1

## 2024-04-11 MED ORDER — LOKELMA 10 G PO PACK: 10.0000 g | PACK | Freq: Every day | ORAL | 0 refills | Status: DC

## 2024-04-11 NOTE — ED Provider Notes (Addendum)
 [place] EMERGENCY DEPARTMENT AT [facility] Provider Note   CSN: [id number] Arrival date & time: 04/11/24  9045     Patient presents with: Vascular Access Problem   Paul Bond is a 83 y.o. male.   HPI 83 year old male sent from [place] with clotted dialysis graft.  He dialyzes Tuesday Thursday Saturday.  He has not missed any dialysis.  He currently denies any shortness of breath.  Daughter is at the bedside with him.  He reports that he went for dialysis and they were unable to use his graft as it was clotted    Prior to Admission medications   Medication Sig Start Date End Date Taking? Authorizing Provider  sodium zirconium cyclosilicate  (LOKELMA ) 10 g PACK packet Take 10 g by mouth daily. 04/11/24  Yes Levander Houston, MD  cinacalcet (SENSIPAR) 30 MG tablet Take 30 mg by mouth daily. Tues, Thurs, and Sat    [provider]  Ensure (ENSURE) Take 237 mLs by mouth 2 (two) times daily.    [provider]  glucose 4 GM chewable tablet Chew 4 tablets by mouth daily as needed for low blood sugar.  for low blood sugar, repeat every 15 minutes if blood sugar is less than 70    [provider]  HYDROcodone -acetaminophen  (NORCO/VICODIN) 5-325 MG tablet Take 1 tablet by mouth every 6 (six) hours as needed for moderate pain (pain score 4-6). 11/18/23   Bethanie Cough, PA-C  insulin  glargine (LANTUS ) 100 UNIT/ML injection Inject 8 Units into the skin every morning.    [provider]  latanoprost  (XALATAN ) 0.005 % ophthalmic solution Place 1 drop into both eyes every evening.    [provider]  sevelamer  carbonate (RENVELA ) 800 MG tablet Take 800 mg by mouth 3 (three) times daily before meals.    [provider]  UNABLE TO FIND Take 1 fluid ounce by mouth daily. Med Name: GWENDOLYNN GILDARDO LOVE   food supplement    [provider]    Allergies: Lisinopril and Semaglutide    Review of Systems  Updated Vital Signs BP 117/63  (BP Location: Right Arm)   Pulse 85   Temp 97.9 F (36.6 C)   Resp 19   Ht 1.702 m (5' 7)   Wt 63.5 kg   SpO2 100%   BMI 21.93 kg/m   Physical Exam Vitals and nursing note reviewed.  HENT:     Head: Normocephalic and atraumatic.     Right Ear: External ear normal.     Left Ear: External ear normal.     Nose: Nose normal.     Mouth/Throat:     Pharynx: Oropharynx is clear.  Eyes:     Pupils: Pupils are equal, round, and reactive to light.  Cardiovascular:     Rate and Rhythm: Normal rate.  Pulmonary:     Effort: Pulmonary effort is normal.  Musculoskeletal:        General: Normal range of motion.     Cervical back: Normal range of motion.     Comments: Left upper extremity with AV graft in place No redness, swelling, warmth, or tenderness  Skin:    General: Skin is warm and dry.     Capillary Refill: Capillary refill takes less than 2 seconds.  Neurological:     Mental Status: He is alert.  Psychiatric:        Mood and Affect: Mood normal.     (all labs ordered are listed, but only abnormal results  are displayed) Labs Reviewed  COMPREHENSIVE METABOLIC PANEL WITH GFR - Abnormal; Notable for the following components:      Result Value   Chloride 93 (*)    Glucose, Bld 167 (*)    BUN 45 (*)    Creatinine, Ser 7.87 (*)    Albumin 3.1 (*)    GFR, Estimated 6 (*)    Anion gap 18 (*)    All other components within normal limits  CBC - Abnormal; Notable for the following components:   RBC 2.86 (*)    Hemoglobin 8.9 (*)    HCT 26.6 (*)    All other components within normal limits    EKG: None  Radiology: No results found.   Procedures   Medications Ordered in the ED  sodium zirconium cyclosilicate  (LOKELMA ) packet 10 g (10 g Oral Given 04/11/24 1114)    Clinical Course as of 04/11/24 1117  Sat Apr 11, 2024  1038 Discussed with Dr. Philip.  Sent secure chat and he will review [DR]    Clinical Course User Index [DR] Levander Houston, MD                                  Medical Decision Making Amount and/or Complexity of Data Reviewed Labs: ordered.  Risk Prescription drug management.   Care discussed with Dr. Philip.  We do not have resources to declot on the weekend.  He will arrange for patient to have declot on Monday. Care discussed with Dr. Geralynn.  Patient is hemodynamically stable and potassium is normal.  He advises that if patient has stable home situation and is comfortable going home he can be discharged.  He will take Lokelma  today and tomorrow and I am giving him doses to take on Monday.  They are advised of signs or symptoms of volume overload and to return.  They were offered hospitalization but declined.   Discussed with IR and they will declot graft on Monday at 9 AM.  They instructed me to tell him to come to short stay at 7 AM on Monday.  This was placed in discharge instructions  Final diagnoses:  Thrombosis of kidney dialysis arteriovenous graft, initial encounter    ED Discharge Orders          Ordered    sodium zirconium cyclosilicate  (LOKELMA ) 10 g PACK packet  Daily        04/11/24 1116               Levander Houston, MD 04/11/24 1117    Levander Houston, MD 04/11/24 1138

## 2024-04-11 NOTE — ED Triage Notes (Signed)
 Pt came in via POV d/t his dialysis center sending him here after trying to access his graft in the Lt arm & was told that it was clotted & did not receive any of his Tx.

## 2024-04-11 NOTE — Discharge Instructions (Addendum)
 Please return for declotting of graft at interventional radiology on Monday. Please take Lokelma  10 mg tomorrow and Monday morning. Return if you are having any shortness of breath, swelling, or other signs of overload. Please come to short stay unit at 7 AM on Monday for declotting of graft at 9 AM

## 2024-04-12 ENCOUNTER — Encounter (HOSPITAL_COMMUNITY): Payer: Self-pay

## 2024-04-12 ENCOUNTER — Other Ambulatory Visit: Payer: Self-pay | Admitting: Student

## 2024-04-12 NOTE — H&P (Signed)
 Chief Complaint: Concern for clotted AVG. Team is requesting a fistulogram possible declot.  Referring Physician(s): Henn,Adam  Supervising Physician: Johann Sieving  Patient Status: [facility] - Out-pt  History of Present Illness: Paul Bond is a 83 y.o. male outpatient[facility] Known to [facility].  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  Patient has a prior left arm brachiobasillic AVF that was ligated on 6.9.25.  Patient presented to the ED at [facility] on 11.1.25 with a clotted AVG. Last LUE decot performed by IR Attending Dr. Frederic Specking on 10.22.25 with a 7 x 4 mm conquest balloon of the peripheral venous anastomosis and 12mm central venous anastomosis left subclavian. Patient presents for left arm graft fistulogram with possible intervention.   Currently without any significant complaints. Patient alert and laying in bed,calm. Denies any fevers, headache, chest pain, SOB, cough, abdominal pain, nausea, vomiting or bleeding.   No recent pertinent images. RIJ TDC removed by IR on 10.3.25.  All medications are within acceptable parameters. No pertinent allergies.  Return precautions and treatment recommendations and follow-up discussed with the patient *** who is agreeable with the plan.    Past Medical History:  Diagnosis Date   Chronic indwelling Foley catheter    Chronic kidney disease    dialysis on tues thurs sat   Diabetes mellitus without complication (HCC)    type 2   Dysrhythmia    PAF with RVR episode 02/03/20 in setting of severe sepsis   Hypertension    Rectal cancer (HCC)    Stroke [facility])     Past Surgical History:  Procedure Laterality Date   A/V FISTULAGRAM Left 05/31/2023   Procedure: A/V Fistulagram;  Surgeon: Melia Lynwood ORN, MD;  Location: [facility] INVASIVE CV LAB;  Service: Cardiovascular;  Laterality: Left;   A/V FISTULAGRAM N/A 07/17/2023   Procedure: A/V Fistulagram;  Surgeon: Melia Lynwood ORN, MD;   Location: [facility] INVASIVE CV LAB;  Service: Cardiovascular;  Laterality: N/A;   AV FISTULA PLACEMENT Left 12/03/2022   Procedure: LEFT ARM ARTERIOVENOUS FISTULA CREATION;  Surgeon: Lanis Fonda BRAVO, MD;  Location: [facility] OR;  Service: Vascular;  Laterality: Left;   BASCILIC VEIN TRANSPOSITION Left 01/28/2023   Procedure: LEFT ARM SECOND STAGE BASILIC VEIN TRANSPOSITION;  Surgeon: Lanis Fonda BRAVO, MD;  Location: [facility] OR;  Service: Vascular;  Laterality: Left;   BIOPSY  02/18/2020   Procedure: BIOPSY;  Surgeon: Leigh Elspeth SQUIBB, MD;  Location: [facility] ENDOSCOPY;  Service: Gastroenterology;;   COLON SURGERY     COLONOSCOPY  02/18/2020   COLONOSCOPY WITH PROPOFOL  N/A 02/18/2020   Procedure: COLONOSCOPY WITH PROPOFOL ;  Surgeon: Leigh Elspeth SQUIBB, MD;  Location: [facility] ENDOSCOPY;  Service: Gastroenterology;  Laterality: N/A;   CYSTOSCOPY W/ URETERAL STENT PLACEMENT Right 02/03/2020   Procedure: CYSTOSCOPY WITH RETROGRADE PYELOGRAM/URETERAL DOUBLE J STENT PLACEMENT;  Surgeon: Matilda Senior, MD;  Location: [facility] OR;  Service: Urology;  Laterality: Right;   DIALYSIS/PERMA CATHETER INSERTION N/A 07/17/2023   Procedure: DIALYSIS/PERMA CATHETER INSERTION;  Surgeon: Melia Lynwood ORN, MD;  Location: [facility] INVASIVE CV LAB;  Service: Cardiovascular;  Laterality: N/A;   DIALYSIS/PERMA CATHETER REMOVAL N/A 05/22/2023   Procedure: DIALYSIS/PERMA CATHETER REMOVAL;  Surgeon: Melia Lynwood ORN, MD;  Location: [facility] INVASIVE CV LAB;  Service: Cardiovascular;  Laterality: N/A;   ESOPHAGOGASTRODUODENOSCOPY (EGD) WITH PROPOFOL  N/A 02/18/2020   Procedure: ESOPHAGOGASTRODUODENOSCOPY (EGD) WITH PROPOFOL ;  Surgeon: Leigh Elspeth SQUIBB, MD;  Location: [facility] ENDOSCOPY;  Service: Gastroenterology;  Laterality: N/A;   FISTULOGRAM  07/29/2023   Procedure: FISTULOGRAM;  Surgeon: Lanis Fonda BRAVO, MD;  Location: [facility] OR;  Service: Vascular;;   INSERTION OF ARTERIOVENOUS (AV) ARTEGRAFT ARM Left 11/18/2023   Procedure: INSERTION, GRAFT, ARTERIOVENOUS, UPPER EXTREMITY;   Surgeon: Lanis Fonda BRAVO, MD;  Location: [facility] OR;  Service: Vascular;  Laterality: Left;   IR FLUORO GUIDE CV LINE RIGHT  02/14/2022   IR REMOVAL TUN CV CATH W/O FL  03/13/2024   IR THROMBECTOMY AV FISTULA W/THROMBOLYSIS/PTA INC/SHUNT/IMG LEFT Left 04/01/2024   IR US  GUIDE VASC ACCESS LEFT  04/01/2024   IR US  GUIDE VASC ACCESS RIGHT  02/14/2022   LIGATION OF ARTERIOVENOUS  FISTULA  11/18/2023   Procedure: LIGATION OF previous ARTERIOVENOUS  FISTULA and redo exposure of brachial artery;  Surgeon: Lanis Fonda BRAVO, MD;  Location: [facility] OR;  Service: Vascular;;   PERIPHERAL VASCULAR BALLOON ANGIOPLASTY  05/31/2023   Procedure: PERIPHERAL VASCULAR BALLOON ANGIOPLASTY;  Surgeon: Melia Lynwood ORN, MD;  Location: [facility] INVASIVE CV LAB;  Service: Cardiovascular;;  lt arm fistula   PERIPHERAL VASCULAR BALLOON ANGIOPLASTY  07/17/2023   Procedure: PERIPHERAL VASCULAR BALLOON ANGIOPLASTY;  Surgeon: Melia Lynwood ORN, MD;  Location: [facility] INVASIVE CV LAB;  Service: Cardiovascular;;  Arterial Anastomosis; Outflow Basilic Vein   PERIPHERAL VASCULAR BALLOON ANGIOPLASTY  07/17/2023   Procedure: PERIPHERAL VASCULAR BALLOON ANGIOPLASTY;  Surgeon: Melia Lynwood ORN, MD;  Location: [facility] INVASIVE CV LAB;  Service: Cardiovascular;;  right innominate   PERIPHERAL VASCULAR THROMBECTOMY Left 01/23/2024   Procedure: PERIPHERAL VASCULAR THROMBECTOMY;  Surgeon: Sheree Penne Bruckner, MD;  Location: [facility];  Service: Cardiovascular;  Laterality: Left;   POLYPECTOMY  02/18/2020   Procedure: POLYPECTOMY;  Surgeon: Leigh Elspeth SQUIBB, MD;  Location: [facility] ENDOSCOPY;  Service: Gastroenterology;;    Allergies: Lisinopril and Semaglutide  Medications: Prior to Admission medications   Medication Sig Start Date End Date Taking? Authorizing Provider  cinacalcet (SENSIPAR) 30 MG tablet Take 30 mg by mouth daily. Tues, Thurs, and Sat    [provider]  Ensure (ENSURE) Take 237 mLs by mouth 2 (two) times daily.    [provider]  glucose 4 GM  chewable tablet Chew 4 tablets by mouth daily as needed for low blood sugar.  for low blood sugar, repeat every 15 minutes if blood sugar is less than 70    [provider]  HYDROcodone -acetaminophen  (NORCO/VICODIN) 5-325 MG tablet Take 1 tablet by mouth every 6 (six) hours as needed for moderate pain (pain score 4-6). 11/18/23   Bethanie Cough, PA-C  insulin  glargine (LANTUS ) 100 UNIT/ML injection Inject 8 Units into the skin every morning.    [provider]  latanoprost  (XALATAN ) 0.005 % ophthalmic solution Place 1 drop into both eyes every evening.    [provider]  sevelamer  carbonate (RENVELA ) 800 MG tablet Take 800 mg by mouth 3 (three) times daily before meals.    [provider]  sodium zirconium cyclosilicate  (LOKELMA ) 10 g PACK packet Take 10 g by mouth daily. 04/11/24   Levander Houston, MD  UNABLE TO FIND Take 1 fluid ounce by mouth daily. Med Name: GWENDOLYNN GILDARDO LOVE   food supplement    [provider]     No family history on file.  Social History   Socioeconomic History   Marital status: Married    Spouse name: Not on file   Number of children: Not on file   Years of education: Not on file   Highest  education level: Not on file  Occupational History   Occupation: retired  Tobacco Use   Smoking status: Former    Current packs/day: 0.00    Average packs/day: 1 pack/day for 15.0 years (15.0 ttl pk-yrs)    Types: Cigarettes    Start date: 54    Quit date: 1974    Years since quitting: 51.8   Smokeless tobacco: Never  Vaping Use   Vaping status: Never Used  Substance and Sexual Activity   Alcohol  use: Not Currently    Comment: quit in 1974, binge drinking prior   Drug use: Never   Sexual activity: Not Currently  Other Topics Concern   Not on file  Social History Narrative   Not on file   Social Drivers of Health   Financial Resource Strain: Not on file  Food Insecurity: No Food Insecurity (02/14/2022)   Hunger  Vital Sign    Worried About Running Out of Food in the Last Year: Never true    Ran Out of Food in the Last Year: Never true  Transportation Needs: No Transportation Needs (02/14/2022)   PRAPARE - Administrator, Civil Service (Medical): No    Lack of Transportation (Non-Medical): No  Physical Activity: Not on file  Stress: Not on file  Social Connections: Not on file    ECOG Status: {CHL ONC ECOG ED:[id number]}  Review of Systems: A 12 point ROS discussed and pertinent positives are indicated in the HPI above.  All other systems are negative.  Review of Systems  Vital Signs: There were no vitals taken for this visit.  Advance Care Plan: {Advance Care Eojw:[id number]}    Physical Exam  Imaging: IR THROMBECTOMY AV FISTULA W/THROMBOLYSIS/PTA INC/SHUNT/IMG LEFT Result Date: 04/01/2024 INDICATION: End-stage renal disease, occluded left upper arm AV graft EXAM: LEFT UPPER ARM AV GRAFT DECLOT INTERVENTION WITH 7 MM PERIPHERAL VENOUS ANGIOPLASTY AND 12 MM CENTRAL VENOUS ANGIOPLASTY MEDICATIONS: 1% lidocaine  local ANESTHESIA/SEDATION: Moderate (conscious) sedation was employed during this procedure. A total of Versed  1.0 mg and Fentanyl  50 mcg was administered intravenously by the radiology nurse. Total intra-service moderate Sedation Time: 40 minutes. The patient's level of consciousness and vital signs were monitored continuously by radiology nursing throughout the procedure under my direct supervision. FLUOROSCOPY: Radiation Exposure Index (as provided by the fluoroscopic device): 28 mGy Kerma COMPLICATIONS: None immediate. PROCEDURE: Informed written consent was obtained from the patient after a thorough discussion of the procedural risks, benefits and alternatives. All questions were addressed. Maximal Sterile Barrier Technique was utilized including caps, mask, sterile gowns, sterile gloves, sterile drape, hand hygiene and skin antiseptic. A timeout was performed prior to the  initiation of the procedure. Previous imaging reviewed. Preliminary ultrasound performed. Left upper arm AV graft is thrombosed. Under sterile conditions and local anesthesia, ultrasound micropuncture access performed a 2 separate locations along the upper arm straight graft. Paul French sheath inserted close to the arterial anastomosis. 6 French sheath inserted close to the venous anastomosis. Kumpe catheter and guidewire advanced through the thrombosed access into the central veins. 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 Otherwise the central veins are patent including the innominate vein and SVC. Pull-back venogram confirms peripheral venous outflow stenosis to the axillary vein. 2 mg tPA instilled for thrombolysis. 3000 units heparin  also instilled. Initially overlapping 7 mm prolonged angioplasty performed with a 7 x 4 Conquest balloon from the venous anastomosis throughout the AV graft. Mechanical thrombectomy performed with the 7 French  cleaner device throughout the graft. Arterial inflow re-established bypassing a 4 French Fogarty catheter across the arterial anastomosis twice. This re-established inflow. Both sheaths were back bled and syringe aspirated. Initial shuntogram confirms restored wide patency with brisk outflow. There is pulsatile flow back bled from both sheaths indicating adequate inflow. Venous anastomosis to the axillary vein is widely patent. 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 Prolonged overlapping 12 mm angioplasty performed over a Amplatz guidewire of the central left subclavian stenosis. Completion central venogram confirms improvement with mild residual narrowing of the central subclavian vein. Access sites removed. Hemostasis obtained with pursestring suture. No immediate complication. Patient tolerated the procedure well. Access ready for use. IMPRESSION:  Successful left upper arm AV graft declot intervention including 7 mm peripheral angioplasty of the venous anastomosis and 12 mm central venous angioplasty of the subclavian stenosis. ACCESS: This access remains amenable to future percutaneous interventions as clinically indicated. Electronically Signed   By: CHRISTELLA.  Shick M.D.   On: 04/01/2024 10:36    Labs:  CBC: Recent Labs    07/29/23 1046 11/18/23 0927 04/01/24 0806 04/11/24 1005  WBC  --   --  12.7* 8.0  HGB 9.5* 8.8* 8.4* 8.9*  HCT 28.0* 26.0* 25.0* 26.6*  PLT  --   --  284 320    COAGS: No results for input(s): INR, APTT in the last 8760 hours.  BMP: Recent Labs    07/29/23 1046 11/18/23 0927 04/01/24 0806 04/11/24 1005  NA 137 136 136 140  K 4.7 4.6 4.5 5.0  CL 98 96* 89* 93*  CO2  --   --  28 29  GLUCOSE 182* 165* 178* 167*  BUN 45* 42* 72* 45*  CALCIUM   --   --  9.3 8.9  CREATININE 8.70* 8.50* 13.14* 7.87*  GFRNONAA  --   --  3* 6*    LIVER FUNCTION TESTS: Recent Labs    04/11/24 1005  BILITOT 0.5  AST 15  ALT 10  ALKPHOS 60  PROT 7.4  ALBUMIN 3.1*    TUMOR MARKERS: No results for input(s): AFPTM, CEA, CA199, CHROMGRNA in the last 8760 hours.  Assessment and Plan:  83 y.o male outpatient[facility] Known to IR.  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  Patient has a prior left arm brachiobasillic AVF that was ligated on 6.9.25.  Patient presented to the ED at [facility] on 11.1.25 with a clotted AVG. Last LUE decot performed by IR Attending Dr. Frederic Specking on 10.22.25 with a 7 x 4 mm conquest balloon of the peripheral venous anastomosis and 12mm central venous anastomosis left subclavian. Patient presents for left arm graft fistulogram with possible intervention.   PLAN: IR Image Guided Fistulogram with possible Intervention.   Risks and benefits discussed with the patient including, but not limited to bleeding, infection,  vascular injury, pulmonary embolism, need for tunneled HD catheter placement or even death.  All of the patient's questions were answered, patient is agreeable to proceed. Consent signed and in chart.      Thank you for this interesting consult.  I greatly enjoyed meeting Jeremiah Curci Maxcy and look forward to participating in their care.  A copy of this report was sent to the requesting provider on this date.  Electronically Signed: Delon JAYSON Beagle, NP 04/12/2024, 9:07 PM   I spent a total  of {New PWEU:[id number]} {New Out-Pt:[id number]}  {Established Out-Pt:[id number]} in face to face in clinical consultation, greater than 50% of which was counseling/coordinating care for ***

## 2024-04-13 ENCOUNTER — Other Ambulatory Visit (HOSPITAL_COMMUNITY): Payer: Self-pay | Admitting: Diagnostic Radiology

## 2024-04-13 ENCOUNTER — Inpatient Hospital Stay (HOSPITAL_COMMUNITY)
Admission: RE | Admit: 2024-04-13 | Discharge: 2024-04-13 | Attending: Diagnostic Radiology | Admitting: Diagnostic Radiology

## 2024-04-13 ENCOUNTER — Other Ambulatory Visit: Payer: Self-pay

## 2024-04-13 DIAGNOSIS — Y832 Surgical operation with anastomosis, bypass or graft as the cause of abnormal reaction of the patient, or of later complication, without mention of misadventure at the time of the procedure: Secondary | ICD-10-CM | POA: Diagnosis not present

## 2024-04-13 DIAGNOSIS — E1122 Type 2 diabetes mellitus with diabetic chronic kidney disease: Secondary | ICD-10-CM | POA: Diagnosis not present

## 2024-04-13 DIAGNOSIS — Z794 Long term (current) use of insulin: Secondary | ICD-10-CM | POA: Diagnosis not present

## 2024-04-13 DIAGNOSIS — I120 Hypertensive chronic kidney disease with stage 5 chronic kidney disease or end stage renal disease: Secondary | ICD-10-CM | POA: Diagnosis not present

## 2024-04-13 DIAGNOSIS — N186 End stage renal disease: Secondary | ICD-10-CM | POA: Insufficient documentation

## 2024-04-13 DIAGNOSIS — T82868A Thrombosis of vascular prosthetic devices, implants and grafts, initial encounter: Secondary | ICD-10-CM | POA: Diagnosis present

## 2024-04-13 DIAGNOSIS — Z992 Dependence on renal dialysis: Secondary | ICD-10-CM | POA: Insufficient documentation

## 2024-04-13 DIAGNOSIS — T82868S Thrombosis of vascular prosthetic devices, implants and grafts, sequela: Secondary | ICD-10-CM

## 2024-04-13 DIAGNOSIS — Z87891 Personal history of nicotine dependence: Secondary | ICD-10-CM | POA: Diagnosis not present

## 2024-04-13 HISTORY — PX: IR US GUIDE VASC ACCESS LEFT: IMG2389

## 2024-04-13 HISTORY — PX: IR THROMBECTOMY AV FISTULA W/THROMBOLYSIS/PTA INC/SHUNT/IMG LEFT: IMG6106

## 2024-04-13 MED ORDER — LIDOCAINE HCL 1 % IJ SOLN
20.0000 mL | Freq: Once | INTRAMUSCULAR | Status: AC
  Administered 2024-04-13: 10 mL via INTRADERMAL

## 2024-04-13 MED ORDER — MIDAZOLAM HCL (PF) 2 MG/2ML IJ SOLN
INTRAMUSCULAR | Status: AC | PRN
Start: 2024-04-13 — End: 2024-04-13
  Administered 2024-04-13: .5 mg via INTRAVENOUS
  Administered 2024-04-13: 1 mg via INTRAVENOUS

## 2024-04-13 MED ORDER — HEPARIN SODIUM (PORCINE) 1000 UNIT/ML IJ SOLN
INTRAMUSCULAR | Status: AC | PRN
  Administered 2024-04-13: 3000 [IU] via INTRAVENOUS

## 2024-04-13 MED ORDER — SODIUM CHLORIDE 0.9 % IV SOLN: INTRAVENOUS | Status: DC

## 2024-04-13 MED ORDER — MIDAZOLAM HCL 2 MG/2ML IJ SOLN
INTRAMUSCULAR | Status: AC
  Filled 2024-04-13: qty 2

## 2024-04-13 MED ORDER — IOHEXOL 300 MG/ML  SOLN: 100.0000 mL | Freq: Once | INTRAMUSCULAR | Status: DC | PRN

## 2024-04-13 MED ORDER — FENTANYL CITRATE (PF) 100 MCG/2ML IJ SOLN
INTRAMUSCULAR | Status: AC
  Filled 2024-04-13: qty 2

## 2024-04-13 MED ORDER — IOHEXOL 300 MG/ML  SOLN
100.0000 mL | Freq: Once | INTRAMUSCULAR | Status: AC | PRN
Start: 2024-04-13 — End: 2024-04-13
  Administered 2024-04-13: 50 mL via INTRAVENOUS

## 2024-04-13 MED ORDER — LIDOCAINE HCL 1 % IJ SOLN
INTRAMUSCULAR | Status: AC
  Filled 2024-04-13: qty 20

## 2024-04-13 MED ORDER — ALTEPLASE 2 MG IJ SOLR
INTRAMUSCULAR | Status: AC
  Filled 2024-04-13: qty 4

## 2024-04-13 MED ORDER — HEPARIN SODIUM (PORCINE) 1000 UNIT/ML IJ SOLN
INTRAMUSCULAR | Status: AC
  Filled 2024-04-13: qty 10

## 2024-04-13 MED ORDER — FENTANYL CITRATE (PF) 100 MCG/2ML IJ SOLN
INTRAMUSCULAR | Status: AC | PRN
  Administered 2024-04-13: 25 ug via INTRAVENOUS

## 2024-04-13 MED ORDER — ALTEPLASE 2 MG IJ SOLR
INTRAMUSCULAR | Status: AC | PRN
  Administered 2024-04-13: 2 mg

## 2024-04-13 NOTE — Procedures (Signed)
  Procedure:  LUE HD graft declot, venous PTA L basilic 7mm, L subclavian 12mm   Preprocedure diagnosis: The encounter diagnosis was Thrombosis of kidney dialysis arteriovenous graft, sequela. Postprocedure diagnosis: same EBL:    minimal Complications:   none immediate  See full dictation in [employer].  CHARM Toribio Faes MD Main # [phone] Pager  [phone] Mobile [phone]

## 2024-04-16 NOTE — Progress Notes (Signed)
 "     Paul Console, Paul Bond [address] [place], [place]  [address] Phone: [phone]   Gastroenterology Consultation  Referring Provider:     [facility], [facility] Primary Care Physician:  Milissa Savant, MD Primary Gastroenterologist:  Paul Console, Paul Bond / Elspeth Naval, MD  Reason for Consultation:     Discussed repeat colonoscopy, history of rectal cancer         HPI:   Discussed the use of AI scribe software for clinical note transcription with the patient, who gave verbal consent to proceed.  83 year old male, established patient Dr. Naval, is referred from the [facility] for history of rectal cancer and abnormal CT imaging.  Here to discuss repeat colonoscopy.  He is here today with his daughter, Paul Bond.  He is in a motorized wheelchair or scooter.  07/05/2022 Abdominal pelvic CT without contrast: - Normal Liver. - Mild nonspecific thickening of the esophagus. - Mild nonspecific intrahepatic biliary prominence. - Mild prominence of the main pancreatic duct with punctate hypodensities scattered throughout. -Heavy stool burden consistent with constipation. - Postsurgical changes related to prior low anterior resection with anastomosis at the rectum.  Overall degree of soft tissue prominence and edema along the presacral space appears increased compared to prior study dated 09/2013.  Recommend clinical correlation and GI follow-up.    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  Had a recent clotted left arm dialysis graft which required [place] ED visit 04/11/2024.  He has chronic indwelling Foley catheter, type 2 diabetes, paroxysmal atrial fibrillation with RVR, hypertension, history of rectal cancer, history of stroke, chronic anemia.  History of Present Illness He has a history of rectal cancer and underwent a CT scan in January 2024, which revealed an abnormal area around the rectal region. No follow-up CT scan has been performed since January 2024.  He  has experienced low hemoglobin levels for over a year. Although he was referred to a cancer center approximately a year ago, he has not seen a hematologist. He is considering a shot to build blood cells but his nephrologist at the [facility] was concerned about its potential to stimulate cancer cell growth. No decision has been made regarding the shot.  No heartburn, trouble swallowing, breathing difficulties, abdominal pain, diarrhea, constipation, blood in stool, or weight loss. The CT scan showed a significant amount of stool in the colon, and past colonoscopy preparations have been suboptimal. He reports daily bowel movements without hard stools or straining.  He is on dialysis and receives care through the [facility]. His iron  and B12 levels are monitored during dialysis, and he receives supplementation as needed. He does not take any blood thinners, including aspirin or Plavix.  02/2020 colonoscopy by Dr. Naval: - For: heme positive stool, anemia ( prior iron  studies c/ w anemia chronic disease) , renal failure, reported history of rectal cancer. - 4 small tubular adenoma polyps removed (3 mm to 8 mm).  - Surgical anastomosis in proximal rectum with areas of ulceration. - Fair prep.  Ileal intubation unsuccessful. - Colon biopsy showed active colitis. - Biopsies for CMV and HSV negative.  02/2020 EGD by Dr. Naval: - For heme positive stool, worsening anemia - 2 cm hiatal hernia.  Mild esophagitis.  Mild gastritis.   - Normal duodenum. - Biopsies negative for H. Pylori, and Barrett's.  Past Medical History:  Diagnosis Date   Chronic indwelling Foley catheter    Chronic kidney disease    dialysis on tues thurs sat  Diabetes mellitus without complication (HCC)    type 2   Dysrhythmia    PAF with RVR episode 02/03/20 in setting of severe sepsis   Glaucoma    Hypertension    Rectal cancer [facility])    Stroke [facility])     Past Surgical History:  Procedure Laterality Date   A/V FISTULAGRAM Left  05/31/2023   Procedure: A/V Fistulagram;  Surgeon: Melia Lynwood ORN, MD;  Location: [facility] INVASIVE CV LAB;  Service: Cardiovascular;  Laterality: Left;   A/V FISTULAGRAM N/A 07/17/2023   Procedure: A/V Fistulagram;  Surgeon: Melia Lynwood ORN, MD;  Location: [facility] INVASIVE CV LAB;  Service: Cardiovascular;  Laterality: N/A;   AV FISTULA PLACEMENT Left 12/03/2022   Procedure: LEFT ARM ARTERIOVENOUS FISTULA CREATION;  Surgeon: Lanis Fonda BRAVO, MD;  Location: [facility] OR;  Service: Vascular;  Laterality: Left;   BASCILIC VEIN TRANSPOSITION Left 01/28/2023   Procedure: LEFT ARM SECOND STAGE BASILIC VEIN TRANSPOSITION;  Surgeon: Lanis Fonda BRAVO, MD;  Location: [facility] OR;  Service: Vascular;  Laterality: Left;   BIOPSY  02/18/2020   Procedure: BIOPSY;  Surgeon: Leigh Elspeth SQUIBB, MD;  Location: [facility] ENDOSCOPY;  Service: Gastroenterology;;   COLON SURGERY     COLONOSCOPY  02/18/2020   COLONOSCOPY WITH PROPOFOL  N/A 02/18/2020   Procedure: COLONOSCOPY WITH PROPOFOL ;  Surgeon: Leigh Elspeth SQUIBB, MD;  Location: [facility] ENDOSCOPY;  Service: Gastroenterology;  Laterality: N/A;   CYSTOSCOPY W/ URETERAL STENT PLACEMENT Right 02/03/2020   Procedure: CYSTOSCOPY WITH RETROGRADE PYELOGRAM/URETERAL DOUBLE J STENT PLACEMENT;  Surgeon: Matilda Senior, MD;  Location: [facility] OR;  Service: Urology;  Laterality: Right;   DIALYSIS/PERMA CATHETER INSERTION N/A 07/17/2023   Procedure: DIALYSIS/PERMA CATHETER INSERTION;  Surgeon: Melia Lynwood ORN, MD;  Location: [facility] INVASIVE CV LAB;  Service: Cardiovascular;  Laterality: N/A;   DIALYSIS/PERMA CATHETER REMOVAL N/A 05/22/2023   Procedure: DIALYSIS/PERMA CATHETER REMOVAL;  Surgeon: Melia Lynwood ORN, MD;  Location: [facility] INVASIVE CV LAB;  Service: Cardiovascular;  Laterality: N/A;   ESOPHAGOGASTRODUODENOSCOPY (EGD) WITH PROPOFOL  N/A 02/18/2020   Procedure: ESOPHAGOGASTRODUODENOSCOPY (EGD) WITH PROPOFOL ;  Surgeon: Leigh Elspeth SQUIBB, MD;  Location: [facility] ENDOSCOPY;  Service: Gastroenterology;  Laterality: N/A;   FISTULOGRAM   07/29/2023   Procedure: FISTULOGRAM;  Surgeon: Lanis Fonda BRAVO, MD;  Location: [facility] OR;  Service: Vascular;;   INSERTION OF ARTERIOVENOUS (AV) ARTEGRAFT ARM Left 11/18/2023   Procedure: INSERTION, GRAFT, ARTERIOVENOUS, UPPER EXTREMITY;  Surgeon: Lanis Fonda BRAVO, MD;  Location: [facility] OR;  Service: Vascular;  Laterality: Left;   IR FLUORO GUIDE CV LINE RIGHT  02/14/2022   IR REMOVAL TUN CV CATH W/O FL  03/13/2024   IR THROMBECTOMY AV FISTULA W/THROMBOLYSIS/PTA INC/SHUNT/IMG LEFT Left 04/01/2024   IR THROMBECTOMY AV FISTULA W/THROMBOLYSIS/PTA INC/SHUNT/IMG LEFT Left 04/13/2024   IR US  GUIDE VASC ACCESS LEFT  04/01/2024   IR US  GUIDE VASC ACCESS LEFT  04/13/2024   IR US  GUIDE VASC ACCESS RIGHT  02/14/2022   LIGATION OF ARTERIOVENOUS  FISTULA  11/18/2023   Procedure: LIGATION OF previous ARTERIOVENOUS  FISTULA and redo exposure of brachial artery;  Surgeon: Lanis Fonda BRAVO, MD;  Location: [facility] OR;  Service: Vascular;;   PERIPHERAL VASCULAR BALLOON ANGIOPLASTY  05/31/2023   Procedure: PERIPHERAL VASCULAR BALLOON ANGIOPLASTY;  Surgeon: Melia Lynwood ORN, MD;  Location: [facility] INVASIVE CV LAB;  Service: Cardiovascular;;  lt arm fistula   PERIPHERAL VASCULAR BALLOON ANGIOPLASTY  07/17/2023   Procedure: PERIPHERAL VASCULAR BALLOON ANGIOPLASTY;  Surgeon: Melia Lynwood ORN, MD;  Location: [facility] INVASIVE CV LAB;  Service: Cardiovascular;;  Arterial Anastomosis; Outflow Basilic Vein   PERIPHERAL VASCULAR BALLOON ANGIOPLASTY  07/17/2023   Procedure: PERIPHERAL VASCULAR BALLOON ANGIOPLASTY;  Surgeon: Melia Lynwood ORN, MD;  Location: [facility] INVASIVE CV LAB;  Service: Cardiovascular;;  right innominate   PERIPHERAL VASCULAR THROMBECTOMY Left 01/23/2024   Procedure: PERIPHERAL VASCULAR THROMBECTOMY;  Surgeon: Sheree Penne Bruckner, MD;  Location: [facility] LAB;  Service: Cardiovascular;  Laterality: Left;   POLYPECTOMY  02/18/2020   Procedure: POLYPECTOMY;  Surgeon: Leigh Elspeth SQUIBB, MD;  Location: [facility] ENDOSCOPY;  Service: Gastroenterology;;    Prior to  Admission medications   Medication Sig Start Date End Date Taking? Authorizing Provider  cinacalcet (SENSIPAR) 30 MG tablet Take 30 mg by mouth daily. Tues, Thurs, and Sat    [provider]  Ensure (ENSURE) Take 237 mLs by mouth 2 (two) times daily.    [provider]  glucose 4 GM chewable tablet Chew 4 tablets by mouth daily as needed for low blood sugar.  for low blood sugar, repeat every 15 minutes if blood sugar is less than 70    [provider]  HYDROcodone -acetaminophen  (NORCO/VICODIN) 5-325 MG tablet Take 1 tablet by mouth every 6 (six) hours as needed for moderate pain (pain score 4-6). 11/18/23   Bethanie Cough, Paul Bond  insulin  glargine (LANTUS ) 100 UNIT/ML injection Inject 8 Units into the skin every morning.    [provider]  latanoprost  (XALATAN ) 0.005 % ophthalmic solution Place 1 drop into both eyes every evening.    [provider]  sevelamer  carbonate (RENVELA ) 800 MG tablet Take 800 mg by mouth 3 (three) times daily before meals.    [provider]  sodium zirconium cyclosilicate  (LOKELMA ) 10 g PACK packet Take 10 g by mouth daily. 04/11/24   Levander Houston, MD  UNABLE TO FIND Take 1 fluid ounce by mouth daily. Med Name: GWENDOLYNN GILDARDO LOVE   food supplement    [provider]    History reviewed. No pertinent family history.   Social History   Tobacco Use   Smoking status: Former    Current packs/day: 0.00    Average packs/day: 1 pack/day for 15.0 years (15.0 ttl pk-yrs)    Types: Cigarettes    Start date: 58    Quit date: 74    Years since quitting: 51.8   Smokeless tobacco: Never  Vaping Use   Vaping status: Never Used  Substance Use Topics   Alcohol  use: Not Currently    Comment: quit in 1974, binge drinking prior   Drug use: Never    Allergies as of 04/17/2024 - Review Complete 04/17/2024  Allergen Reaction Noted   Lisinopril Cough 12/24/2016   Semaglutide Other (See Comments) 05/13/2019     Review of Systems:    All systems reviewed and negative except where noted in HPI.   Physical Exam:  BP (!) 104/50 (BP Location: Right Arm, Patient Position: Sitting, Cuff Size: Normal)   Pulse 88   Wt 146 lb 4 oz (66.3 kg)   BMI 22.91 kg/m  No LMP for male patient.  General:   Alert, thin, feeble, elderly male, pleasant and cooperative in NAD.   Wheelchair dependent.  Sitting in a motorized wheelchair. Lungs:  Respirations even and unlabored.  Clear throughout to auscultation.   No wheezes, crackles, or rhonchi. No acute distress. Heart:  Regular rate and rhythm; no murmurs, clicks, rubs, or gallops. Abdomen:  Normal bowel sounds.  No bruits.  Soft, and non-distended without masses, hepatosplenomegaly or hernias noted.  No  Tenderness.  No guarding or rebound tenderness.    Psych:  Alert and cooperative. Normal mood and affect.  Imaging Studies: IR THROMBECTOMY AV FISTULA W/THROMBOLYSIS/PTA INC/SHUNT/IMG LEFT Result Date: 04/13/2024 CLINICAL DATA:  Occluded dialysis graft COMPARISON:  04/01/2024 EXAM: EXAM DIALYSIS GRAFT DECLOT VENOUS ANGIOPLASTY x2 ULTRASOUND GUIDANCE FOR VASCULAR ACCESS X2 TECHNIQUE: The procedure, risks (including but not limited to bleeding, infection, organ damage ), benefits, and alternatives were explained to the patient. Questions regarding the procedure were encouraged and answered. The patient understands and consents to the procedure. Intravenous Fentanyl  25mcg and Versed  1.5mg  were administered by RN during a total moderate (conscious) sedation time of 36 minutes; the patient's level of consciousness and physiological / cardiorespiratory status were monitored continuously by radiology RN under my direct supervision. The peripheral aspect of the graft was accessed antegrade with a 21-gauge micropuncture needle under real-time ultrasonic guidance after the overlying skin prepped with Betadine, draped in usual sterile fashion, infiltrated locally with 1% lidocaine .  Needle exchanged over 018 guidewire for transitional dilator through which 2 mg t-PA was administered. Ultrasound imaging documentation was saved. Through the dilator, a Bentson wire was advanced to the venous anastomosis. Over this a 88F sheath was placed, through which a 5 French Kumpe catheter was advanced for outflow venography. This showed patency of the outflow venous system through the SVC. 3000 units heparin  were administered. The cleaner device was used to macerate thrombus in the graft. In similar fashion, the graft was accessed more centrally retrograde under ultrasound with a micropuncture needle, exchanged for a transitional dilator. The venous limb dilator was exchanged in similar fashion for a 6 French vascular sheath. The over the wire Fogarty catheter was advanced across the arterial anastomosis and used to dislodge the platelet plug into the arterial limb of the graft. The Fogarty device was used again to remove any residual platelet plug from the arterial anastomosis. Injection showed clearance of thrombus from the graft. Balloon angioplasty of the venous anastomosis was performed using a 7 mm x 4 cm Conquest angioplasty balloon with good response. Balloon was removed and injection showed patency of the anastomosis, without extravasation or other apparent complication. Balloon angioplasty of central left subclavian vein stenosis was performed with 12 mm conQuest balloon. Follow-up injection showed significant resolution of the focal stenosis and decreased collateral filling. A follow shuntogram was performed, demonstrating good flow through the outflow vein, no extravasation. Reflux across the arterial anastomosis demonstrate this to be widely patent, with unremarkable appearance of the visualized native arterial circulation. The catheter and sheaths were then removed and hemostasis achieved with 2-0 Ethilon sutures. Patient tolerated procedure well. FLUOROSCOPY: Radiation Exposure Index (as  provided by the fluoroscopic device): 16.4 mGy air Kerma COMPLICATIONS: COMPLICATIONS none IMPRESSION: 1. Technically successful declot of left upper arm straight synthetic hemodialysis graft. 2. Technically successful 7 mm balloon angioplasty of venous anastomotic stenosis. 3. Technically successful 12 mm balloon angioplasty of stenosis in the central left native subclavian vein. ACCESS: Remains approachable for percutaneous intervention as needed. Electronically Signed   By: JONETTA Faes M.D.   On: 04/13/2024 16:26   IR US  Guide Vasc Access Left Result Date: 04/13/2024 CLINICAL DATA:  Occluded dialysis graft COMPARISON:  04/01/2024 EXAM: EXAM DIALYSIS GRAFT DECLOT VENOUS ANGIOPLASTY x2 ULTRASOUND GUIDANCE FOR VASCULAR ACCESS X2 TECHNIQUE: The procedure, risks (including but not limited to bleeding, infection, organ damage ), benefits, and alternatives were explained to the patient. Questions regarding the procedure were encouraged and answered. The patient understands and consents to  the procedure. Intravenous Fentanyl  25mcg and Versed  1.5mg  were administered by RN during a total moderate (conscious) sedation time of 36 minutes; the patient's level of consciousness and physiological / cardiorespiratory status were monitored continuously by radiology RN under my direct supervision. The peripheral aspect of the graft was accessed antegrade with a 21-gauge micropuncture needle under real-time ultrasonic guidance after the overlying skin prepped with Betadine, draped in usual sterile fashion, infiltrated locally with 1% lidocaine . Needle exchanged over 018 guidewire for transitional dilator through which 2 mg t-PA was administered. Ultrasound imaging documentation was saved. Through the dilator, a Bentson wire was advanced to the venous anastomosis. Over this a 29F sheath was placed, through which a 5 French Kumpe catheter was advanced for outflow venography. This showed patency of the outflow venous system through  the SVC. 3000 units heparin  were administered. The cleaner device was used to macerate thrombus in the graft. In similar fashion, the graft was accessed more centrally retrograde under ultrasound with a micropuncture needle, exchanged for a transitional dilator. The venous limb dilator was exchanged in similar fashion for a 6 French vascular sheath. The over the wire Fogarty catheter was advanced across the arterial anastomosis and used to dislodge the platelet plug into the arterial limb of the graft. The Fogarty device was used again to remove any residual platelet plug from the arterial anastomosis. Injection showed clearance of thrombus from the graft. Balloon angioplasty of the venous anastomosis was performed using a 7 mm x 4 cm Conquest angioplasty balloon with good response. Balloon was removed and injection showed patency of the anastomosis, without extravasation or other apparent complication. Balloon angioplasty of central left subclavian vein stenosis was performed with 12 mm conQuest balloon. Follow-up injection showed significant resolution of the focal stenosis and decreased collateral filling. A follow shuntogram was performed, demonstrating good flow through the outflow vein, no extravasation. Reflux across the arterial anastomosis demonstrate this to be widely patent, with unremarkable appearance of the visualized native arterial circulation. The catheter and sheaths were then removed and hemostasis achieved with 2-0 Ethilon sutures. Patient tolerated procedure well. FLUOROSCOPY: Radiation Exposure Index (as provided by the fluoroscopic device): 16.4 mGy air Kerma COMPLICATIONS: COMPLICATIONS none IMPRESSION: 1. Technically successful declot of left upper arm straight synthetic hemodialysis graft. 2. Technically successful 7 mm balloon angioplasty of venous anastomotic stenosis. 3. Technically successful 12 mm balloon angioplasty of stenosis in the central left native subclavian vein. ACCESS:  Remains approachable for percutaneous intervention as needed. Electronically Signed   By: JONETTA Faes M.D.   On: 04/13/2024 16:26   IR THROMBECTOMY AV FISTULA W/THROMBOLYSIS/PTA INC/SHUNT/IMG LEFT Result Date: 04/01/2024 INDICATION: End-stage renal disease, occluded left upper arm AV graft EXAM: LEFT UPPER ARM AV GRAFT DECLOT INTERVENTION WITH 7 MM PERIPHERAL VENOUS ANGIOPLASTY AND 12 MM CENTRAL VENOUS ANGIOPLASTY MEDICATIONS: 1% lidocaine  local ANESTHESIA/SEDATION: Moderate (conscious) sedation was employed during this procedure. A total of Versed  1.0 mg and Fentanyl  50 mcg was administered intravenously by the radiology nurse. Total intra-service moderate Sedation Time: 40 minutes. The patient's level of consciousness and vital signs were monitored continuously by radiology nursing throughout the procedure under my direct supervision. FLUOROSCOPY: Radiation Exposure Index (as provided by the fluoroscopic device): 28 mGy Kerma COMPLICATIONS: None immediate. PROCEDURE: Informed written consent was obtained from the patient after a thorough discussion of the procedural risks, benefits and alternatives. All questions were addressed. Maximal Sterile Barrier Technique was utilized including caps, mask, sterile gowns, sterile gloves, sterile drape, hand hygiene and skin antiseptic. A  timeout was performed prior to the initiation of the procedure. Previous imaging reviewed. Preliminary ultrasound performed. Left upper arm AV graft is thrombosed. Under sterile conditions and local anesthesia, ultrasound micropuncture access performed a 2 separate locations along the upper arm straight graft. Seven French sheath inserted close to the arterial anastomosis. 6 French sheath inserted close to the venous anastomosis. Kumpe catheter and guidewire advanced through the thrombosed access into the central veins. 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 Otherwise the central veins  are patent including the innominate vein and SVC. Pull-back venogram confirms peripheral venous outflow stenosis to the axillary vein. 2 mg tPA instilled for thrombolysis. 3000 units heparin  also instilled. Initially overlapping 7 mm prolonged angioplasty performed with a 7 x 4 Conquest balloon from the venous anastomosis throughout the AV graft. Mechanical thrombectomy performed with the 7 French cleaner device throughout the graft. Arterial inflow re-established bypassing a 4 French Fogarty catheter across the arterial anastomosis twice. This re-established inflow. Both sheaths were back bled and syringe aspirated. Initial shuntogram confirms restored wide patency with brisk outflow. There is pulsatile flow back bled from both sheaths indicating adequate inflow. Venous anastomosis to the axillary vein is widely patent. 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 Prolonged overlapping 12 mm angioplasty performed over a Amplatz guidewire of the central left subclavian stenosis. Completion central venogram confirms improvement with mild residual narrowing of the central subclavian vein. Access sites removed. Hemostasis obtained with pursestring suture. No immediate complication. Patient tolerated the procedure well. Access ready for use. IMPRESSION: Successful left upper arm AV graft declot intervention including 7 mm peripheral angioplasty of the venous anastomosis and 12 mm central venous angioplasty of the subclavian stenosis. ACCESS: This access remains amenable to future percutaneous interventions as clinically indicated. Electronically Signed   By: CHRISTELLA.  Shick M.D.   On: 04/01/2024 10:36    Labs: CBC    Component Value Date/Time   WBC 8.0 04/11/2024 1005   RBC 2.86 (L) 04/11/2024 1005   HGB 8.9 (L) 04/11/2024 1005   HGB 9.4 (L) 02/27/2023 0958   HCT 26.6 (L) 04/11/2024 1005   PLT 320 04/11/2024 1005   PLT 229 02/27/2023 0958   MCV  93.0 04/11/2024 1005    CMP     Component Value Date/Time   NA 140 04/11/2024 1005   K 5.0 04/11/2024 1005   CL 93 (L) 04/11/2024 1005   CO2 29 04/11/2024 1005   GLUCOSE 167 (H) 04/11/2024 1005   BUN 45 (H) 04/11/2024 1005   CREATININE 7.87 (H) 04/11/2024 1005   CREATININE 6.82 (H) 02/27/2023 0958   CALCIUM  8.9 04/11/2024 1005   PROT 7.4 04/11/2024 1005   ALBUMIN 3.1 (L) 04/11/2024 1005   AST 15 04/11/2024 1005   AST 14 (L) 02/27/2023 0958   ALT 10 04/11/2024 1005   ALT 7 02/27/2023 0958   ALKPHOS 60 04/11/2024 1005   BILITOT 0.5 04/11/2024 1005   BILITOT 0.5 02/27/2023 0958   GFRNONAA 6 (L) 04/11/2024 1005   GFRNONAA 8 (L) 02/27/2023 0958   GFRAA 12 (L) 02/21/2020 0336    Assessment and Plan:   Paul Bond is a 82 y.o. y/o male has been referred for: Assessment & Plan 1.  History of rectal cancer, evaluation for recurrence Evaluation for recurrence of rectal cancer. Previous CT in January 2024 showed nonspecific abnormality around rectal area.  I am ordering repeat updated CT scan  for reevaluation.  Considering flexible sigmoidoscopy due to increased risk of colonoscopy in patients over 80.  I believe he is at increased risk of complication from colonoscopy procedure given him advanced age and multiple comorbidities.  If we do schedule flexible sigmoidoscopy or colonoscopy, it would have to be done in hospital.  Risks and benefits of procedure were discussed at length with patient and his daughter.  He would likely have difficulty completing a colonoscopy prep. - Ordered repeat CT scan without contrast. - Lab: CMP - Will discuss case with Dr. Leigh. - Will consider flexible sigmoidoscopy based on CT results and discussion with Dr. Leigh.  2.  Chronic constipation Previous CT showed significant stool in colon. No hard stools or straining. Poor bowel prep noted previously. Miralax  recommended to improve bowel habits. - Recommended Miralax  daily   3.  Chronic  anemia: Likely multifactorial; anemia of chronic kidney disease - Labs: CBC, iron  panel, ferritin, B12, folate  4.  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  Had a recent clotted left arm dialysis graft which required [place] ED visit 04/11/2024.  He has chronic indwelling Foley catheter, type 2 diabetes, paroxysmal atrial fibrillation with RVR, hypertension, history of rectal cancer, history of stroke, chronic anemia.   Follow up with Dr. Leigh in 3 months.  Paul Console, Paul Bond   "

## 2024-04-17 ENCOUNTER — Ambulatory Visit: Admitting: Physician Assistant

## 2024-04-17 ENCOUNTER — Other Ambulatory Visit (INDEPENDENT_AMBULATORY_CARE_PROVIDER_SITE_OTHER)

## 2024-04-17 ENCOUNTER — Telehealth: Payer: Self-pay

## 2024-04-17 ENCOUNTER — Encounter: Payer: Self-pay | Admitting: Physician Assistant

## 2024-04-17 VITALS — BP 104/50 | HR 88 | Wt 146.2 lb

## 2024-04-17 DIAGNOSIS — Z85048 Personal history of other malignant neoplasm of rectum, rectosigmoid junction, and anus: Secondary | ICD-10-CM

## 2024-04-17 DIAGNOSIS — R933 Abnormal findings on diagnostic imaging of other parts of digestive tract: Secondary | ICD-10-CM | POA: Diagnosis not present

## 2024-04-17 DIAGNOSIS — D649 Anemia, unspecified: Secondary | ICD-10-CM | POA: Diagnosis not present

## 2024-04-17 DIAGNOSIS — N186 End stage renal disease: Secondary | ICD-10-CM

## 2024-04-17 DIAGNOSIS — R634 Abnormal weight loss: Secondary | ICD-10-CM

## 2024-04-17 DIAGNOSIS — Z992 Dependence on renal dialysis: Secondary | ICD-10-CM

## 2024-04-17 DIAGNOSIS — K5904 Chronic idiopathic constipation: Secondary | ICD-10-CM | POA: Diagnosis not present

## 2024-04-17 LAB — COMPREHENSIVE METABOLIC PANEL WITH GFR
ALT: 8 U/L (ref 0–53)
AST: 10 U/L (ref 0–37)
Albumin: 3.9 g/dL (ref 3.5–5.2)
Alkaline Phosphatase: 73 U/L (ref 39–117)
BUN: 26 mg/dL — ABNORMAL HIGH (ref 6–23)
CO2: 35 meq/L — ABNORMAL HIGH (ref 19–32)
Calcium: 9.2 mg/dL (ref 8.4–10.5)
Chloride: 94 meq/L — ABNORMAL LOW (ref 96–112)
Creatinine, Ser: 5.91 mg/dL (ref 0.40–1.50)
GFR: 8.27 mL/min — CL (ref >60.00)
Glucose, Bld: 167 mg/dL — ABNORMAL HIGH (ref 70–99)
Potassium: 5.1 meq/L (ref 3.5–5.1)
Sodium: 143 meq/L (ref 135–145)
Total Bilirubin: 0.5 mg/dL (ref 0.2–1.2)
Total Protein: 7.2 g/dL (ref 6.0–8.3)

## 2024-04-17 LAB — CBC WITH DIFFERENTIAL/PLATELET
Basophils Absolute: 0 K/uL (ref 0.0–0.1)
Basophils Relative: 0.8 % (ref 0.0–3.0)
Eosinophils Absolute: 0.3 K/uL (ref 0.0–0.7)
Eosinophils Relative: 5.6 % — ABNORMAL HIGH (ref 0.0–5.0)
HCT: 26 % — ABNORMAL LOW (ref 39.0–52.0)
Hemoglobin: 8.7 g/dL — ABNORMAL LOW (ref 13.0–17.0)
Lymphocytes Relative: 27.7 % (ref 12.0–46.0)
Lymphs Abs: 1.7 K/uL (ref 0.7–4.0)
MCHC: 33.5 g/dL (ref 30.0–36.0)
MCV: 92.7 fl (ref 78.0–100.0)
Monocytes Absolute: 0.9 K/uL (ref 0.1–1.0)
Monocytes Relative: 14.9 % — ABNORMAL HIGH (ref 3.0–12.0)
Neutro Abs: 3.1 K/uL (ref 1.4–7.7)
Neutrophils Relative %: 51 % (ref 43.0–77.0)
Platelets: 229 K/uL (ref 150.0–400.0)
RBC: 2.81 Mil/uL — ABNORMAL LOW (ref 4.22–5.81)
RDW: 14 % (ref 11.5–15.5)
WBC: 6 K/uL (ref 4.0–10.5)

## 2024-04-17 LAB — B12 AND FOLATE PANEL
Folate: 10.6 ng/mL (ref >5.9)
Vitamin B-12: 1032 pg/mL — ABNORMAL HIGH (ref 211–911)

## 2024-04-17 IMAGING — CT CT BIOPSY
1 of 2 series · 15 of 28 positions shown, 19 images · non-contrast
Comparison: none

CLINICAL DATA: Monoclonal gammopathy and need for bone marrow
biopsy.

[Series 2: i-spiral 5.0 br40 · axial · 0.98mm/px · z∈[-144,-63]mm · 15 of 27 slices shown, 19 images]
[im 2/27  mediastinal]
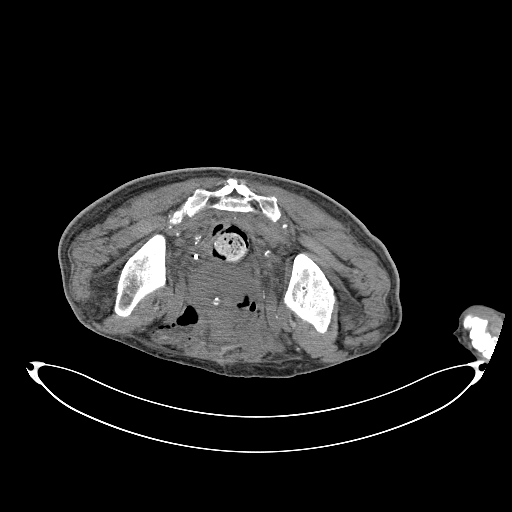
[im 2/27  lung]
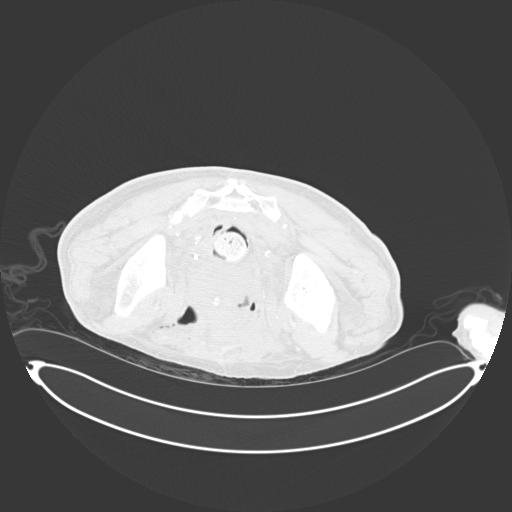
[im 4/27  lung]
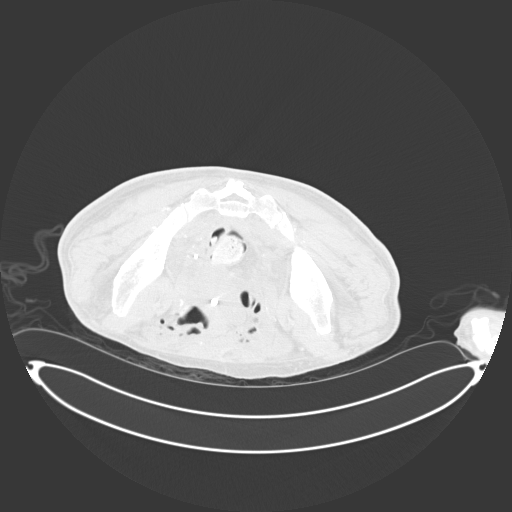
[im 5/27  lung]
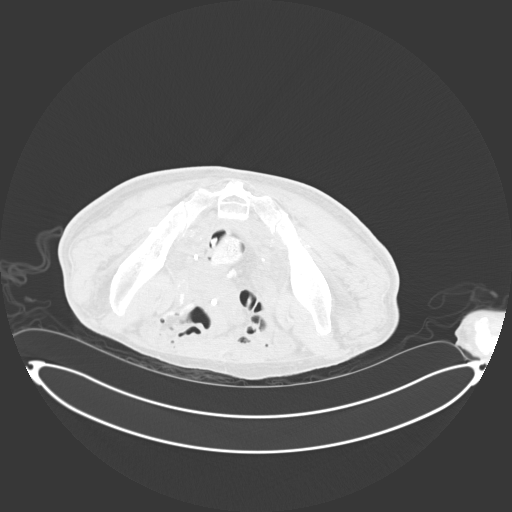
[im 7/27  lung]
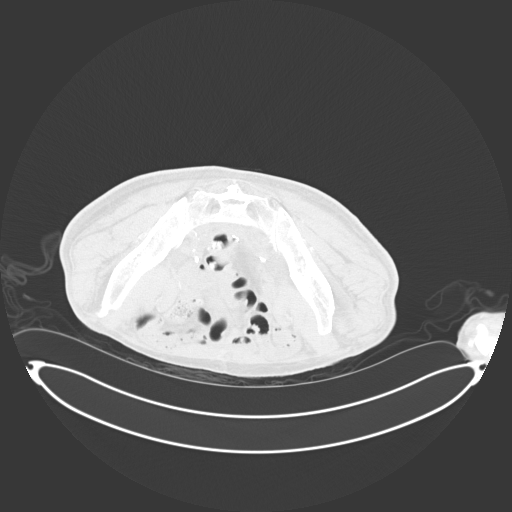
[im 8/27  mediastinal]
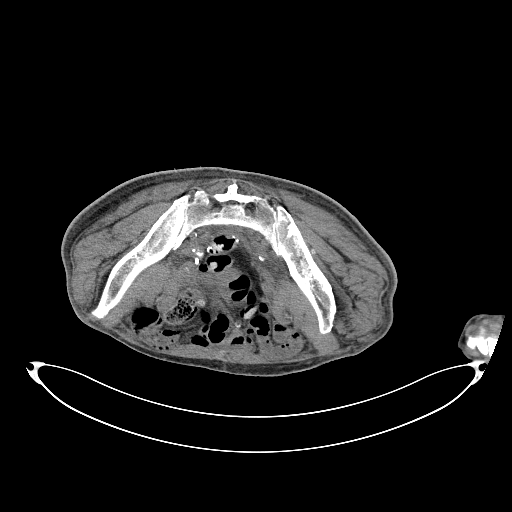
[im 8/27  lung]
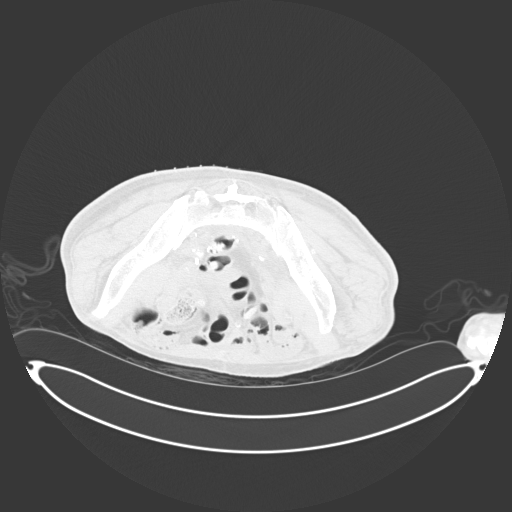
[im 10/27  lung]
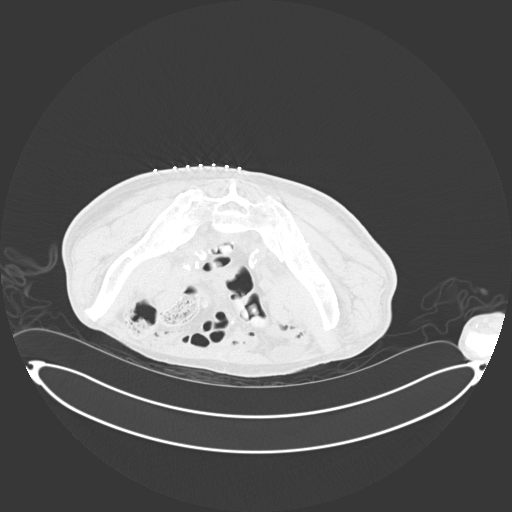
[im 11/27  lung]
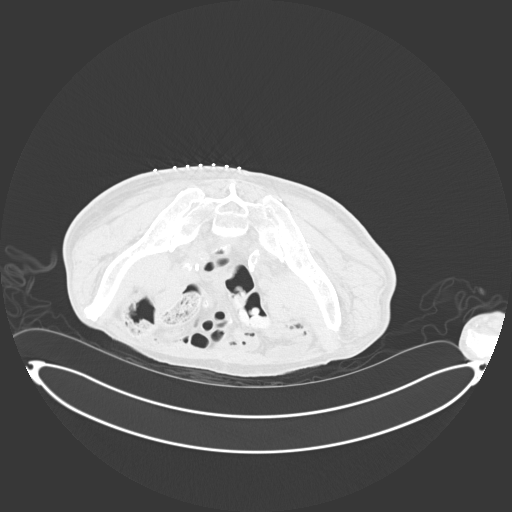
[im 14/27  lung]
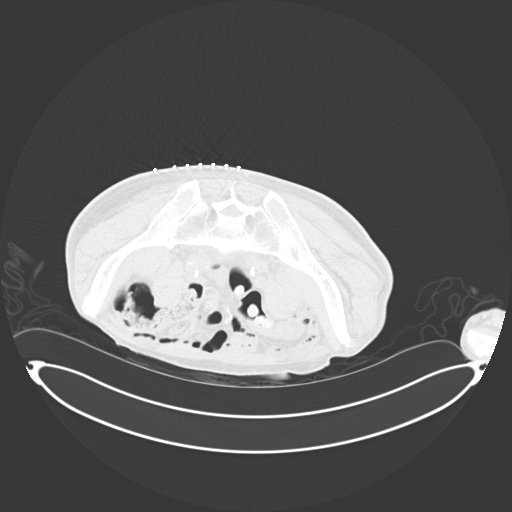
[im 16/27  mediastinal]
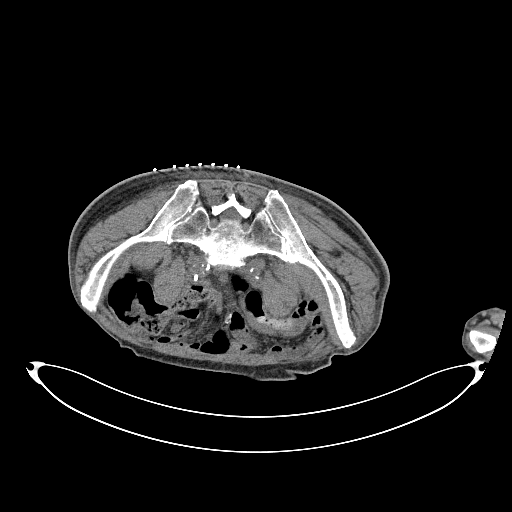
[im 16/27  lung]
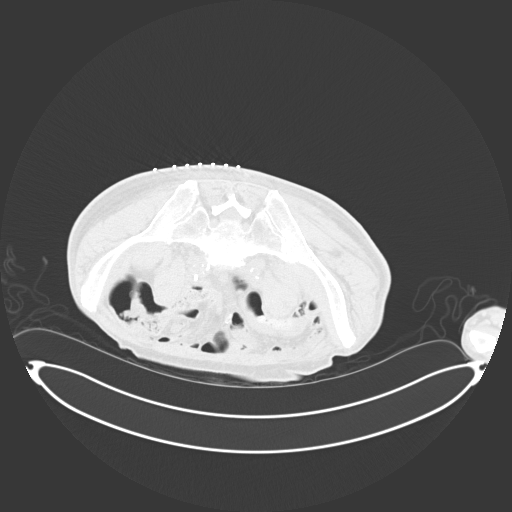
[im 17/27  lung]
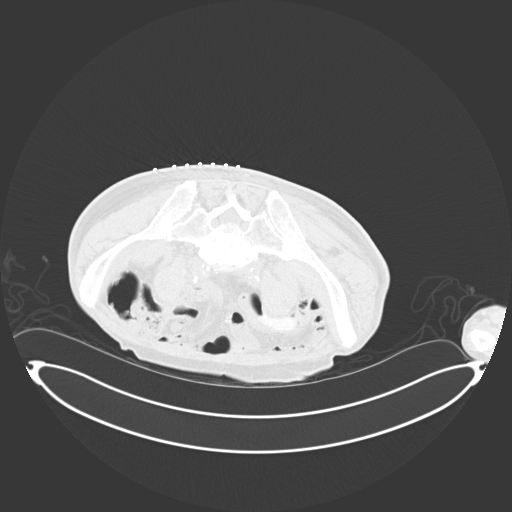
[im 19/27  lung]
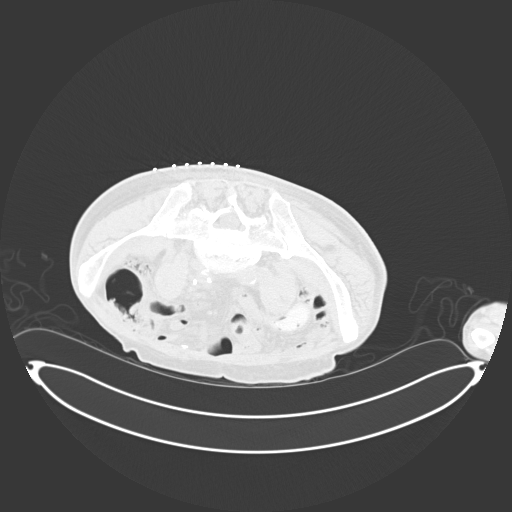
[im 20/27  lung]
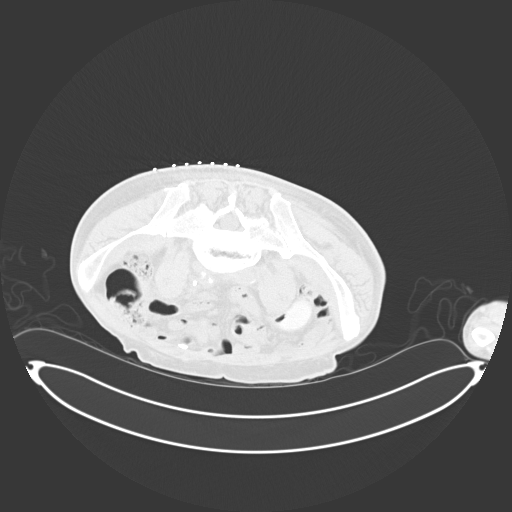
[im 22/27  mediastinal]
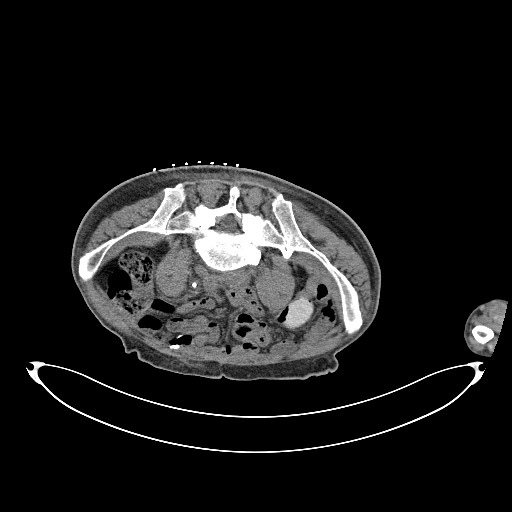
[im 22/27  lung]
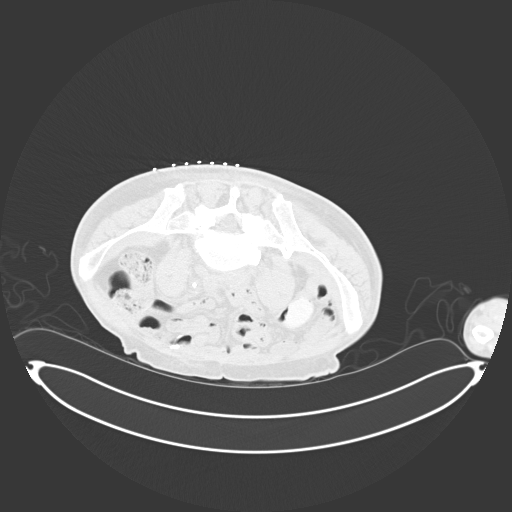
[im 23/27  lung]
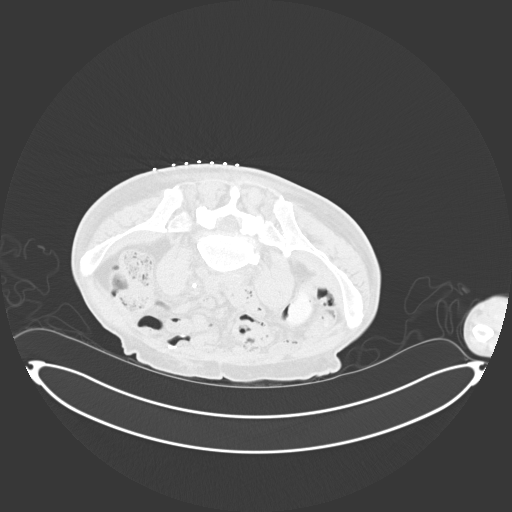
[im 25/27  lung]
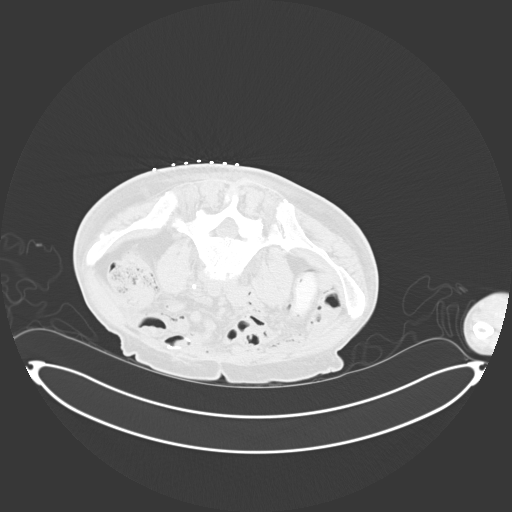

[15 of 28 positions shown; findings below may reference images not displayed]

EXAM:
CT GUIDED BONE MARROW ASPIRATION AND BIOPSY

ANESTHESIA/SEDATION:
Moderate (conscious) sedation was employed during this procedure. A
total of Versed 0.5 mg and Fentanyl 50 mcg was administered
intravenously by radiology nursing.

Moderate Sedation Time: 12 minutes. The patient's level of
consciousness and vital signs were monitored continuously by
radiology nursing throughout the procedure under my direct
supervision.

PROCEDURE:
The procedure risks, benefits, and alternatives were explained to
the patient. Questions regarding the procedure were encouraged and
answered. The patient understands and consents to the procedure. A
time out was performed prior to initiating the procedure.

The right gluteal region was prepped with chlorhexidine. Sterile
gown and sterile gloves were used for the procedure. Local
anesthesia was provided with 1% Lidocaine.

Under CT guidance, an 11 gauge On Control bone cutting needle was
advanced from a posterior approach into the right iliac bone. Needle
positioning was confirmed with CT. Initial non heparinized and
heparinized aspirate samples were obtained of bone marrow. Core
biopsy was performed via the On Control drill needle.

COMPLICATIONS:
None
FINDINGS: Inspection of initial aspirate did reveal visible particles. Intact
core biopsy sample was obtained.
IMPRESSION: CT guided bone marrow biopsy of right posterior iliac bone with both
aspirate and core samples obtained.

## 2024-04-17 IMAGING — CT CT BIOPSY AND ASPIRATION BONE MARROW
1 of 2 series · 15 of 28 positions shown, 19 images · non-contrast
Comparison: none

CLINICAL DATA: Monoclonal gammopathy and need for bone marrow
biopsy.

[Series 2: i-spiral 5.0 br40 · axial · 0.98mm/px · z∈[-144,-63]mm · 15 of 27 slices shown, 19 images]
[im 2/27  mediastinal]
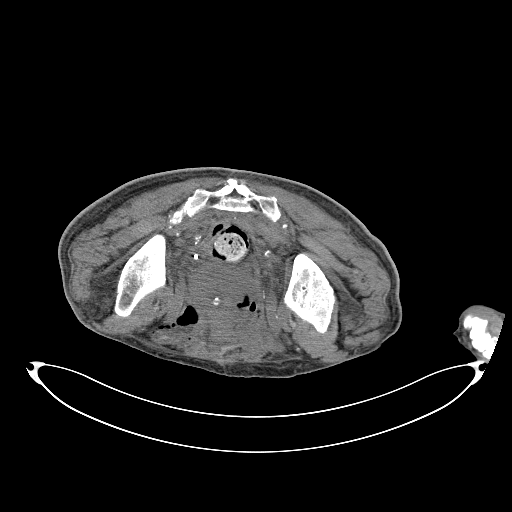
[im 2/27  lung]
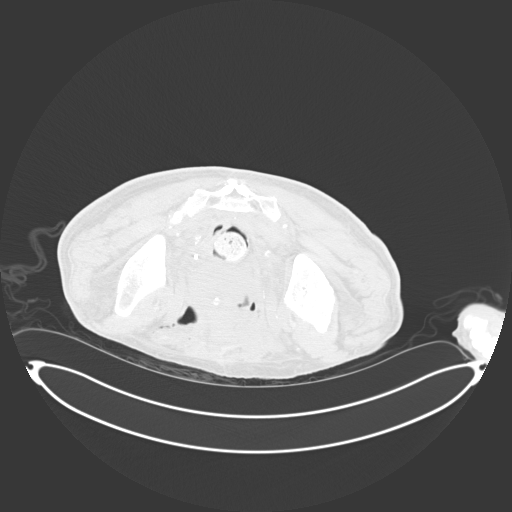
[im 4/27  lung]
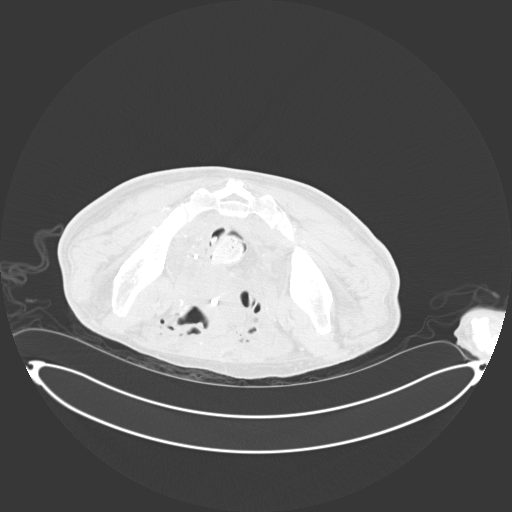
[im 5/27  lung]
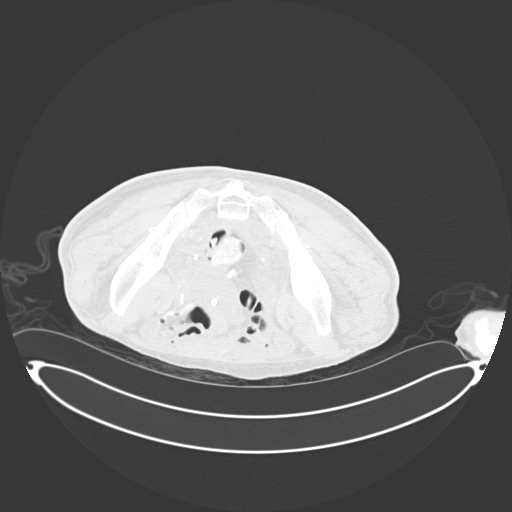
[im 7/27  lung]
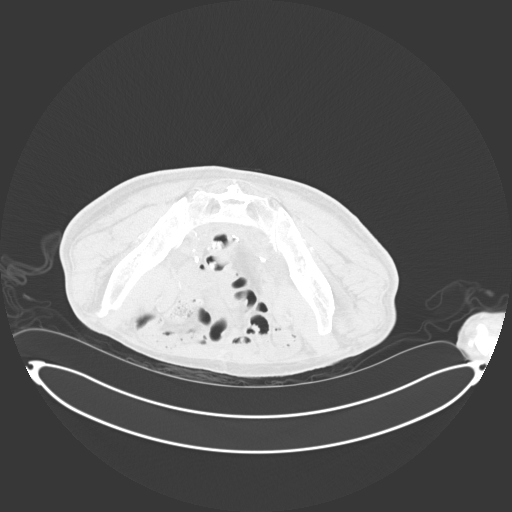
[im 8/27  mediastinal]
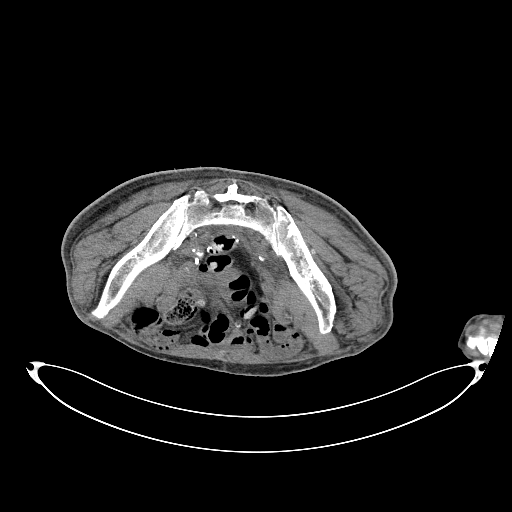
[im 8/27  lung]
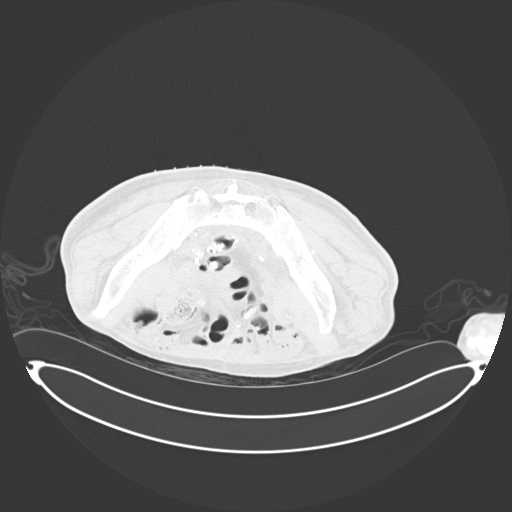
[im 10/27  lung]
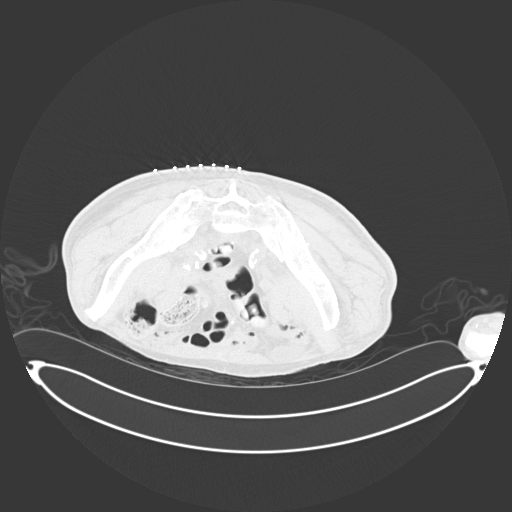
[im 11/27  lung]
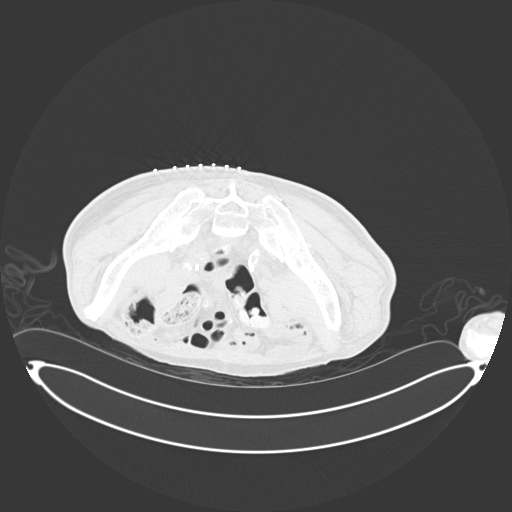
[im 14/27  lung]
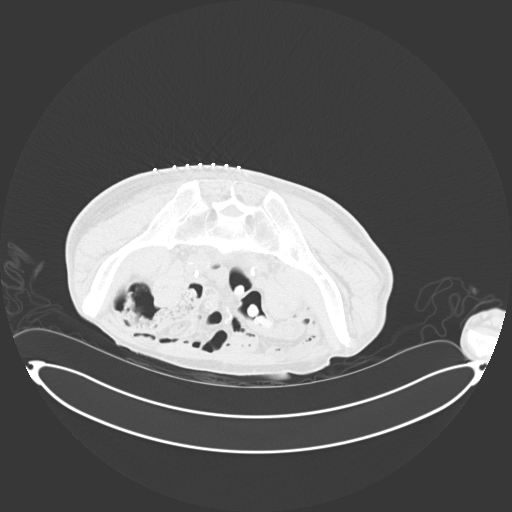
[im 16/27  mediastinal]
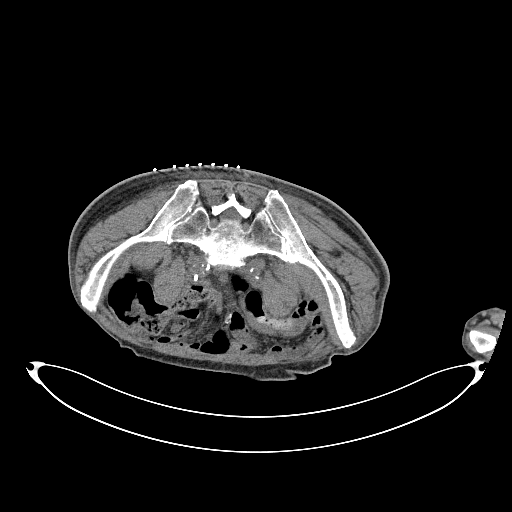
[im 16/27  lung]
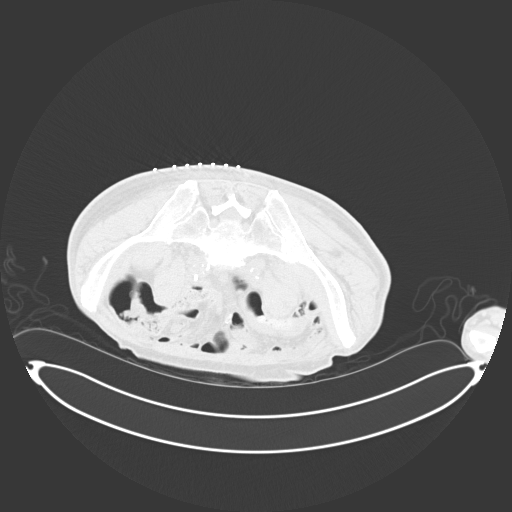
[im 17/27  lung]
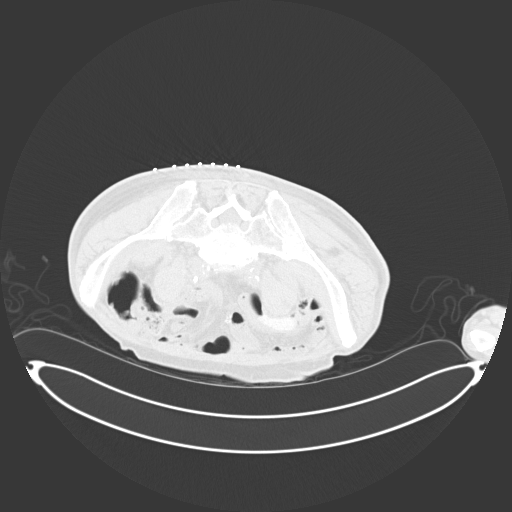
[im 19/27  lung]
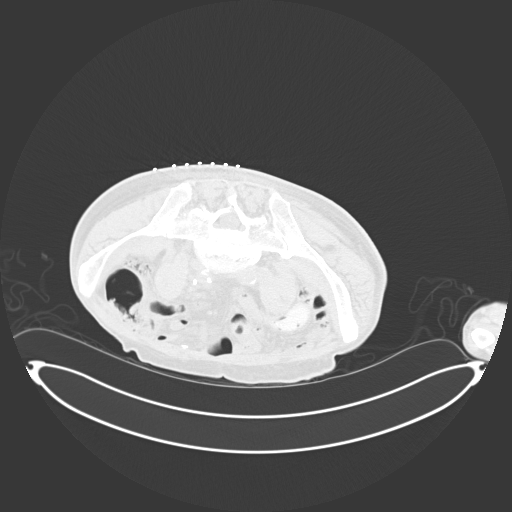
[im 20/27  lung]
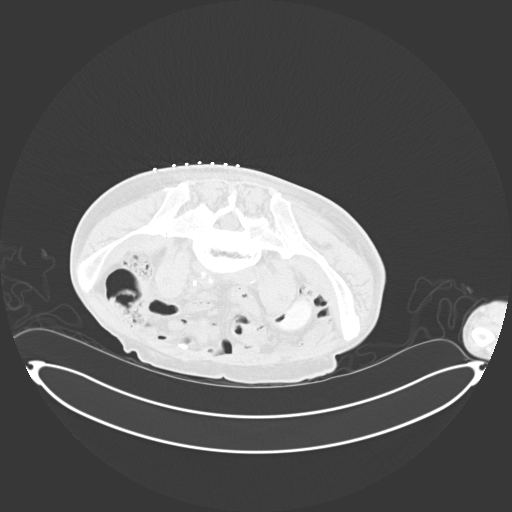
[im 22/27  mediastinal]
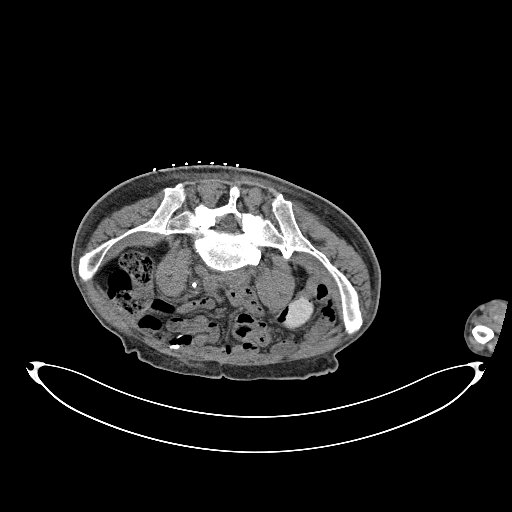
[im 22/27  lung]
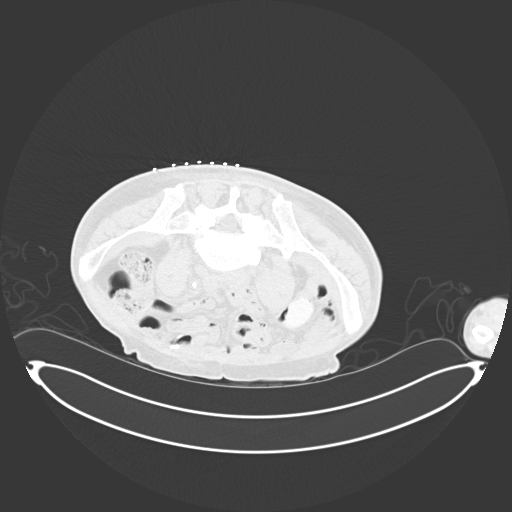
[im 23/27  lung]
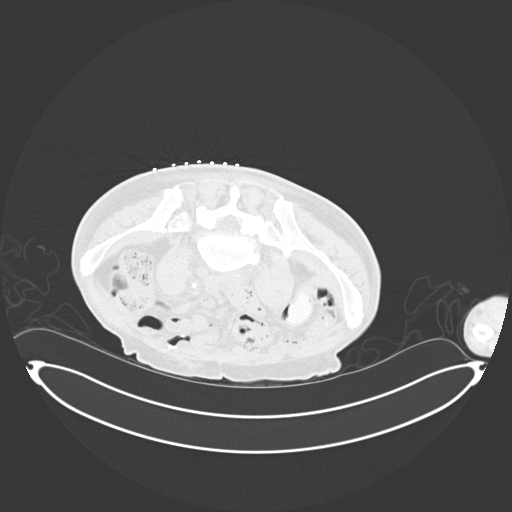
[im 25/27  lung]
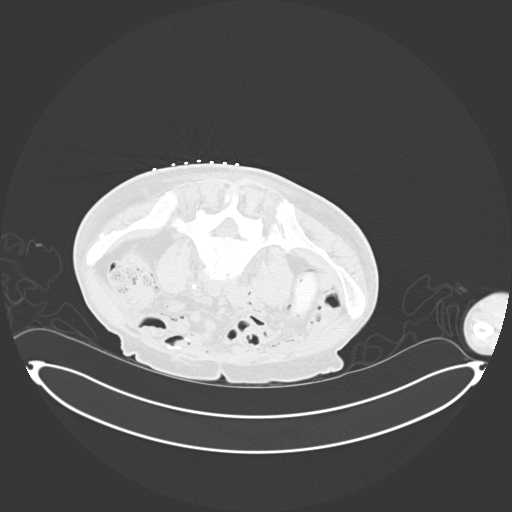

[15 of 28 positions shown; findings below may reference images not displayed]

EXAM:
CT GUIDED BONE MARROW ASPIRATION AND BIOPSY

ANESTHESIA/SEDATION:
Moderate (conscious) sedation was employed during this procedure. A
total of Versed 0.5 mg and Fentanyl 50 mcg was administered
intravenously by radiology nursing.

Moderate Sedation Time: 12 minutes. The patient's level of
consciousness and vital signs were monitored continuously by
radiology nursing throughout the procedure under my direct
supervision.

PROCEDURE:
The procedure risks, benefits, and alternatives were explained to
the patient. Questions regarding the procedure were encouraged and
answered. The patient understands and consents to the procedure. A
time out was performed prior to initiating the procedure.

The right gluteal region was prepped with chlorhexidine. Sterile
gown and sterile gloves were used for the procedure. Local
anesthesia was provided with 1% Lidocaine.

Under CT guidance, an 11 gauge On Control bone cutting needle was
advanced from a posterior approach into the right iliac bone. Needle
positioning was confirmed with CT. Initial non heparinized and
heparinized aspirate samples were obtained of bone marrow. Core
biopsy was performed via the On Control drill needle.

COMPLICATIONS:
None
FINDINGS: Inspection of initial aspirate did reveal visible particles. Intact
core biopsy sample was obtained.
IMPRESSION: CT guided bone marrow biopsy of right posterior iliac bone with both
aspirate and core samples obtained.

## 2024-04-17 NOTE — Telephone Encounter (Signed)
 The lab called with the following Critical results:  Creatinine 5.91 GFR 8.27  Please advise.

## 2024-04-17 NOTE — Patient Instructions (Signed)
 Your provider has requested that you go to the basement level for lab work before leaving today. Press B on the elevator. The lab is located at the first door on the left as you exit the elevator.  You have been scheduled for a CT scan of the abdomen and pelvis at [facility], 1st floor Radiology. You are scheduled on 04/22/24 at 2:30 pm. You should arrive 15 minutes prior to your appointment time for registration. If you have any questions regarding your exam or if you need to reschedule, you may call [facility] at [phone] between the hours of 8:00 am and 5:00 pm, Monday-Friday.   Please follow up sooner if symptoms increase or worsen  Due to recent changes in healthcare laws, you may see the results of your imaging and laboratory studies on MyChart before your provider has had a chance to review them.  We understand that in some cases there may be results that are confusing or concerning to you. Not all laboratory results come back in the same time frame and the provider may be waiting for multiple results in order to interpret others.  Please give us  48 hours in order for your provider to thoroughly review all the results before contacting the office for clarification of your results.   Thank you for trusting me with your gastrointestinal care!   Ellouise Console, PA-C _______________________________________________________  If your blood pressure at your visit was 140/90 or greater, please contact your primary care physician to follow up on this.  _______________________________________________________  If you are age 54 or older, your body mass index should be between 23-30. Your Body mass index is 22.91 kg/m. If this is out of the aforementioned range listed, please consider follow up with your Primary Care Provider.  If you are age 29 or younger, your body mass index should be between 19-25. Your Body mass index is 22.91 kg/m. If this is out of the aformentioned  range listed, please consider follow up with your Primary Care Provider.   ________________________________________________________  The [facility] providers would like to encourage you to use MYCHART to communicate with providers for non-urgent requests or questions.  Due to long hold times on the telephone, sending your provider a message by [facility] may be a faster and more efficient way to get a response.  Please allow 48 business hours for a response.  Please remember that this is for non-urgent requests.  _______________________________________________________

## 2024-04-18 LAB — IRON,TIBC AND FERRITIN PANEL
%SAT: 38 % (ref 20–48)
Ferritin: 873 ng/mL — ABNORMAL HIGH (ref 24–380)
Iron: 85 ug/dL (ref 50–180)
TIBC: 224 ug/dL — ABNORMAL LOW (ref 250–425)

## 2024-04-18 NOTE — Progress Notes (Signed)
 Very difficult situation. He is high risk for endoscopic evaluation and likely would require hospitalization to do any prep. Flex sig may be reasonable to look at the rectum and rule out malignancy there. He had significant inflammatory changes there on prior colonoscopy done years ago. I would do CT first, see what that shows and let me know, we can go from there. Await labs / iron  studies.  Of note, this would normally be a Dr. Wilhelmenia patient from looking through the chart, I only helped out with the procedures when he was inpatient, but no one saw him as outpatient. I can help follow him.

## 2024-04-20 ENCOUNTER — Ambulatory Visit: Payer: Self-pay | Admitting: Physician Assistant

## 2024-04-21 NOTE — Progress Notes (Signed)
 Attending Physician's Attestation   I have reviewed the chart.   I agree with the Advanced Practitioner's note, impression, and recommendations with any updates as below. Start with CT. No evidence of IDA, so would not necessarily require EGD. Agree with consideration of Colonoscopy v Sigmoidoscopy. See results of CT and we can go from there and agree any procedure(s) will need to be based in hospital-outpatient unit.   Aloha Finner, MD [facility] Advanced [facility]oscopy Office # [phone]

## 2024-04-22 ENCOUNTER — Ambulatory Visit (HOSPITAL_COMMUNITY)
Admission: RE | Admit: 2024-04-22 | Discharge: 2024-04-22 | Disposition: A | Discharge status: Home / Self Care | Source: Ambulatory Visit | Attending: Physician Assistant | Admitting: Physician Assistant

## 2024-04-22 DIAGNOSIS — Z85048 Personal history of other malignant neoplasm of rectum, rectosigmoid junction, and anus: Secondary | ICD-10-CM | POA: Diagnosis present

## 2024-04-22 DIAGNOSIS — R634 Abnormal weight loss: Secondary | ICD-10-CM | POA: Insufficient documentation

## 2024-04-22 DIAGNOSIS — R933 Abnormal findings on diagnostic imaging of other parts of digestive tract: Secondary | ICD-10-CM | POA: Diagnosis present

## 2024-04-22 MED ORDER — IOHEXOL 9 MG/ML PO SOLN
1000.0000 mL | ORAL | Status: AC
  Administered 2024-04-22: 1000 mL via ORAL

## 2024-04-27 NOTE — Progress Notes (Signed)
 I sent patient CT results and recommendations through MyChart. Forward report to Dr. Leigh and Dr. Wilhelmenia for review. Ellouise Console, PA-C

## 2024-04-30 ENCOUNTER — Other Ambulatory Visit: Payer: Self-pay

## 2024-04-30 DIAGNOSIS — Z85048 Personal history of other malignant neoplasm of rectum, rectosigmoid junction, and anus: Secondary | ICD-10-CM

## 2024-04-30 MED ORDER — NA SULFATE-K SULFATE-MG SULF 17.5-3.13-1.6 GM/177ML PO SOLN: 1.0000 | Freq: Once | ORAL | 0 refills | Status: AC

## 2024-05-12 ENCOUNTER — Telehealth: Payer: Self-pay

## 2024-05-12 NOTE — Telephone Encounter (Signed)
 Elenor Senters, [facility]  case manager called to report pt's LUE AVG is clotted and he needs dialysis.  She wanted to know if it was possible to place a [facility] today or tomorrow (05/13/24).  Advised to have patient go to the ED if his need for dialysis is that emergent.

## 2024-05-13 ENCOUNTER — Other Ambulatory Visit: Payer: Self-pay

## 2024-05-13 DIAGNOSIS — N186 End stage renal disease: Secondary | ICD-10-CM

## 2024-06-25 ENCOUNTER — Ambulatory Visit (HOSPITAL_COMMUNITY)
Admission: RE | Admit: 2024-06-25 | Discharge: 2024-06-25 | Disposition: A | Discharge status: Home / Self Care | Source: Ambulatory Visit | Attending: Surgery | Admitting: Surgery

## 2024-06-25 ENCOUNTER — Ambulatory Visit (HOSPITAL_COMMUNITY)

## 2024-06-25 ENCOUNTER — Encounter: Payer: Self-pay | Admitting: Vascular Surgery

## 2024-06-25 ENCOUNTER — Ambulatory Visit: Admitting: Vascular Surgery

## 2024-06-25 ENCOUNTER — Ambulatory Visit (HOSPITAL_BASED_OUTPATIENT_CLINIC_OR_DEPARTMENT_OTHER)
Admission: RE | Admit: 2024-06-25 | Discharge: 2024-06-25 | Disposition: A | Source: Ambulatory Visit | Attending: Surgery | Admitting: Surgery

## 2024-06-25 VITALS — BP 103/62 | HR 88 | Temp 98.4°F | Resp 22 | Ht 67.0 in | Wt 146.0 lb

## 2024-06-25 DIAGNOSIS — N186 End stage renal disease: Secondary | ICD-10-CM | POA: Diagnosis not present

## 2024-06-25 NOTE — Progress Notes (Signed)
 " Office Note     CC:  ESRD Requesting Provider:  Milissa Savant, MD  HPI: Paul Bond is a Right handed 84 y.o. (May 16, 1941) male with kidney disease who presents at the request of Milissa Savant, MD for permanent HD access. The patient has had prior access procedures which include left arm loop graft, left arm upper extremity brachiobasilic fistula creation in 01/2023.  This failed to mature leading to brachial artery to axillary vein AV graft.  This worked for several months prior to occlusion possibly due to central stenosis.  Regardless, he presents today, accompanied by his daughter for new access.    On exam, Paul Bond was doing well. He had no complaints on today's exam. Currently being dialyzed Monday Wednesday Friday through a left-sided tunneled dialysis catheter.   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 INVASIVE CV LAB;  Service: Cardiovascular;  Laterality: N/A;   AV FISTULA PLACEMENT Left 12/03/2022   Procedure: LEFT ARM ARTERIOVENOUS FISTULA CREATION;  Surgeon: Lanis Paul BRAVO, MD;  Location: [facility] OR;  Service: Vascular;  Laterality: Left;   BASCILIC VEIN TRANSPOSITION Left 01/28/2023   Procedure: LEFT ARM SECOND STAGE BASILIC VEIN TRANSPOSITION;  Surgeon: Lanis Paul BRAVO, MD;  Location: [facility] OR;  Service: Vascular;  Laterality: Left;   BIOPSY  02/18/2020   Procedure:  BIOPSY;  Surgeon: Leigh Elspeth SQUIBB, MD;  Location: [facility] ENDOSCOPY;  Service: Gastroenterology;;   COLON SURGERY     COLONOSCOPY  02/18/2020   COLONOSCOPY WITH PROPOFOL  N/A 02/18/2020   Procedure: COLONOSCOPY WITH PROPOFOL ;  Surgeon: Leigh Elspeth SQUIBB, MD;  Location: [facility] ENDOSCOPY;  Service: Gastroenterology;  Laterality: N/A;   CYSTOSCOPY W/ URETERAL STENT PLACEMENT Right 02/03/2020   Procedure: CYSTOSCOPY WITH RETROGRADE PYELOGRAM/URETERAL DOUBLE J STENT PLACEMENT;  Surgeon: Matilda Senior, MD;  Location: [facility] OR;  Service: Urology;  Laterality: Right;   DIALYSIS/PERMA CATHETER INSERTION N/A 07/17/2023   Procedure: DIALYSIS/PERMA CATHETER INSERTION;  Surgeon: Melia Lynwood ORN, MD;  Location: [facility] INVASIVE CV LAB;  Service: Cardiovascular;  Laterality: N/A;   DIALYSIS/PERMA CATHETER REMOVAL N/A 05/22/2023   Procedure: DIALYSIS/PERMA CATHETER REMOVAL;  Surgeon: Melia Lynwood ORN, MD;  Location: [facility] INVASIVE CV LAB;  Service: Cardiovascular;  Laterality: N/A;   ESOPHAGOGASTRODUODENOSCOPY (EGD) WITH PROPOFOL  N/A 02/18/2020   Procedure: ESOPHAGOGASTRODUODENOSCOPY (EGD) WITH PROPOFOL ;  Surgeon: Leigh Elspeth SQUIBB, MD;  Location: [facility] ENDOSCOPY;  Service: Gastroenterology;  Laterality: N/A;   FISTULOGRAM  07/29/2023   Procedure: FISTULOGRAM;  Surgeon: Lanis Paul BRAVO, MD;  Location: [facility] OR;  Service: Vascular;;   INSERTION OF ARTERIOVENOUS (AV) ARTEGRAFT ARM Left 11/18/2023   Procedure: INSERTION, GRAFT, ARTERIOVENOUS, UPPER EXTREMITY;  Surgeon: Lanis Paul BRAVO, MD;  Location: [facility] OR;  Service: Vascular;  Laterality: Left;   IR FLUORO GUIDE CV LINE RIGHT  02/14/2022  IR REMOVAL TUN CV CATH W/O FL  03/13/2024   IR THROMBECTOMY AV FISTULA W/THROMBOLYSIS/PTA INC/SHUNT/IMG LEFT Left 04/01/2024   IR THROMBECTOMY AV FISTULA W/THROMBOLYSIS/PTA INC/SHUNT/IMG LEFT Left 04/13/2024   IR US  GUIDE VASC ACCESS LEFT  04/01/2024   IR US  GUIDE VASC ACCESS LEFT  04/13/2024   IR US  GUIDE VASC ACCESS RIGHT  02/14/2022   LIGATION OF  ARTERIOVENOUS  FISTULA  11/18/2023   Procedure: LIGATION OF previous ARTERIOVENOUS  FISTULA and redo exposure of brachial artery;  Surgeon: Lanis Paul BRAVO, MD;  Location: [facility] OR;  Service: Vascular;;   PERIPHERAL VASCULAR BALLOON ANGIOPLASTY  05/31/2023   Procedure: PERIPHERAL VASCULAR BALLOON ANGIOPLASTY;  Surgeon: Melia Lynwood ORN, MD;  Location: [facility] INVASIVE CV LAB;  Service: Cardiovascular;;  lt arm fistula   PERIPHERAL VASCULAR BALLOON ANGIOPLASTY  07/17/2023   Procedure: PERIPHERAL VASCULAR BALLOON ANGIOPLASTY;  Surgeon: Melia Lynwood ORN, MD;  Location: [facility] INVASIVE CV LAB;  Service: Cardiovascular;;  Arterial Anastomosis; Outflow Basilic Vein   PERIPHERAL VASCULAR BALLOON ANGIOPLASTY  07/17/2023   Procedure: PERIPHERAL VASCULAR BALLOON ANGIOPLASTY;  Surgeon: Melia Lynwood ORN, MD;  Location: [facility] INVASIVE CV LAB;  Service: Cardiovascular;;  right innominate   PERIPHERAL VASCULAR THROMBECTOMY Left 01/23/2024   Procedure: PERIPHERAL VASCULAR THROMBECTOMY;  Surgeon: Sheree Penne Bruckner, MD;  Location: [facility] LAB;  Service: Cardiovascular;  Laterality: Left;   POLYPECTOMY  02/18/2020   Procedure: POLYPECTOMY;  Surgeon: Leigh Elspeth SQUIBB, MD;  Location: [facility] ENDOSCOPY;  Service: Gastroenterology;;    Social History   Socioeconomic History   Marital status: Married    Spouse name: Not on file   Number of children: 3   Years of education: Not on file   Highest education level: Not on file  Occupational History   Occupation: retired  Tobacco Use   Smoking status: Former    Current packs/day: 0.00    Average packs/day: 1 pack/day for 15.0 years (15.0 ttl pk-yrs)    Types: Cigarettes    Start date: 61    Quit date: 1974    Years since quitting: 52.0   Smokeless tobacco: Never  Vaping Use   Vaping status: Never Used  Substance and Sexual Activity   Alcohol  use: Not Currently    Comment: quit in 1974, binge drinking prior   Drug use: Never   Sexual activity: Not Currently  Other Topics Concern    Not on file  Social History Narrative   Not on file   Social Drivers of Health   Tobacco Use: Medium Risk (05/12/2024)   Received from [facility]   Patient History    Smoking Tobacco Use: Former    Smokeless Tobacco Use: Never    Passive Exposure: Not on Actuary Strain: Not on file  Food Insecurity: No Food Insecurity (02/14/2022)   Hunger Vital Sign    Worried About Running Out of Food in the Last Year: Never true    Ran Out of Food in the Last Year: Never true  Transportation Needs: No Transportation Needs (02/14/2022)   PRAPARE - Administrator, Civil Service (Medical): No    Lack of Transportation (Non-Medical): No  Physical Activity: Not on file  Stress: Not on file  Social Connections: Not on file  Intimate Partner Violence: Not At Risk (02/14/2022)   Humiliation, Afraid, Rape, and Kick questionnaire    Fear of Current or Ex-Partner: No    Emotionally Abused: No    Physically Abused: No    Sexually Abused:  No  Depression (PHQ2-9): Not on file  Alcohol  Screen: Not on file  Housing: Low Risk (02/14/2022)   Housing    Last Housing Risk Score: 0  Utilities: Not At Risk (02/14/2022)   AHC Utilities    Threatened with loss of utilities: No  Health Literacy: Not on file   No family history on file.  Current Outpatient Medications  Medication Sig Dispense Refill   Cholecalciferol  (VITAMIN D3 PO) Take 1 tablet by mouth 3 times/day as needed-between meals & bedtime (on Dialysis days).     cinacalcet (SENSIPAR) 30 MG tablet Take 30 mg by mouth daily. Tues, Thurs, and Sat     Ensure (ENSURE) Take 237 mLs by mouth 2 (two) times daily.     glucose 4 GM chewable tablet Chew 4 tablets by mouth daily as needed for low blood sugar.  for low blood sugar, repeat every 15 minutes if blood sugar is less than 70     latanoprost  (XALATAN ) 0.005 % ophthalmic solution Place 1 drop into both eyes every evening.     sevelamer  carbonate (RENVELA ) 800 MG tablet Take 800  mg by mouth 3 (three) times daily before meals.     No current facility-administered medications for this visit.    Allergies  Allergen Reactions   Lisinopril Cough   Semaglutide Other (See Comments)    Indigestion  *Ozempic     REVIEW OF SYSTEMS:   [X]  denotes positive finding, [ ]  denotes negative finding Cardiac  Comments:  Chest pain or chest pressure:    Shortness of breath upon exertion:    Short of breath when lying flat:    Irregular heart rhythm:        Vascular    Pain in calf, thigh, or hip brought on by ambulation:    Pain in feet at night that wakes you up from your sleep:     Blood clot in your veins:    Leg swelling:         Pulmonary    Oxygen at home:    Productive cough:     Wheezing:         Neurologic    Sudden weakness in arms or legs:     Sudden numbness in arms or legs:     Sudden onset of difficulty speaking or slurred speech:    Temporary loss of vision in one eye:     Problems with dizziness:         Gastrointestinal    Blood in stool:     Vomited blood:         Genitourinary    Burning when urinating:     Blood in urine:        Psychiatric    Major depression:         Hematologic    Bleeding problems:    Problems with blood clotting too easily:        Skin    Rashes or ulcers:        Constitutional    Fever or chills:      PHYSICAL EXAMINATION:  There were no vitals filed for this visit.  General:  WDWN in NAD; vital signs documented above Gait: Not observed HENT: WNL, normocephalic Pulmonary: normal non-labored breathing , without Rales, rhonchi,  wheezing Cardiac: regular HR Abdomen: soft, NT, no masses Skin: without rashes Vascular Exam/Pulses:  Right Left  Radial 2+ (normal) 2+ (normal)  Ulnar 2+ (normal) 2+ (normal)  Extremities: without ischemic changes, without Gangrene , without cellulitis; without open wounds;  Musculoskeletal: no muscle wasting or atrophy  Neurologic: A&O X 3;   No focal weakness or paresthesias are detected Psychiatric:  The pt has Normal affect.   Non-Invasive Vascular Imaging:   Possible usable right sided basilic vein    ASSESSMENT/PLAN:  Paul Bond is a 84 y.o. 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 Based on vein mapping and examination, he is a candidate for right arm brachiobasilic fistula creation versus right arm AV graft. I had a long conversation with Abshir regarding the above, most notably the risks and benefits send the reality that I am unsure if they will have a longer patency as I am unsure as to why he has had so many problems with left-sided access to this point.  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 Regardless, after discussing the risks and benefits of right arm access, Paul Bond stated that he was not ready to make a decision, and would like to continue using his left-sided tunneled dialysis catheter.  My plan is to see him again in 3 months time to revisit fistula versus graft discussion due to long-term infection risk with tunneled dialysis catheter.   Paul FORBES Rim, MD [facility] [phone]  "

## 2024-06-30 ENCOUNTER — Ambulatory Visit: Admitting: Gastroenterology

## 2024-07-20 ENCOUNTER — Ambulatory Visit: Admitting: Physician Assistant

## 2024-08-03 ENCOUNTER — Ambulatory Visit (HOSPITAL_COMMUNITY): Admit: 2024-08-03 | Admitting: Gastroenterology

## 2024-08-03 ENCOUNTER — Encounter (HOSPITAL_COMMUNITY): Payer: Self-pay

## 2024-08-03 SURGERY — COLONOSCOPY
Anesthesia: Monitor Anesthesia Care

## 2024-10-01 ENCOUNTER — Ambulatory Visit: Admitting: Vascular Surgery
